# Patient Record
Sex: Male | Born: 1950 | Race: White | Hispanic: No | Marital: Married | Smoking: Never smoker
Health system: Southern US, Community
[De-identification: ages and names within clinical notes are randomized; demographics above are authoritative.]

## PROBLEM LIST (undated history)

## (undated) DIAGNOSIS — I071 Rheumatic tricuspid insufficiency: Secondary | ICD-10-CM

## (undated) DIAGNOSIS — I499 Cardiac arrhythmia, unspecified: Secondary | ICD-10-CM

## (undated) DIAGNOSIS — R011 Cardiac murmur, unspecified: Secondary | ICD-10-CM

## (undated) DIAGNOSIS — D649 Anemia, unspecified: Secondary | ICD-10-CM

## (undated) DIAGNOSIS — I340 Nonrheumatic mitral (valve) insufficiency: Secondary | ICD-10-CM

## (undated) DIAGNOSIS — I341 Nonrheumatic mitral (valve) prolapse: Secondary | ICD-10-CM

## (undated) DIAGNOSIS — R112 Nausea with vomiting, unspecified: Secondary | ICD-10-CM

## (undated) DIAGNOSIS — Z9889 Other specified postprocedural states: Secondary | ICD-10-CM

## (undated) DIAGNOSIS — M169 Osteoarthritis of hip, unspecified: Secondary | ICD-10-CM

## (undated) DIAGNOSIS — I10 Essential (primary) hypertension: Secondary | ICD-10-CM

## (undated) DIAGNOSIS — D469 Myelodysplastic syndrome, unspecified: Secondary | ICD-10-CM

## (undated) DIAGNOSIS — R42 Dizziness and giddiness: Secondary | ICD-10-CM

## (undated) DIAGNOSIS — C61 Malignant neoplasm of prostate: Secondary | ICD-10-CM

## (undated) DIAGNOSIS — I4821 Permanent atrial fibrillation: Secondary | ICD-10-CM

## (undated) DIAGNOSIS — T7840XA Allergy, unspecified, initial encounter: Secondary | ICD-10-CM

## (undated) DIAGNOSIS — I482 Chronic atrial fibrillation, unspecified: Secondary | ICD-10-CM

## (undated) HISTORY — PX: PROSTATE BIOPSY: SHX241

## (undated) HISTORY — DX: Nonrheumatic mitral (valve) prolapse: I34.1

## (undated) HISTORY — PX: CARDIAC CATHETERIZATION: SHX172

## (undated) HISTORY — DX: Dizziness and giddiness: R42

## (undated) HISTORY — PX: VASECTOMY: SHX75

## (undated) HISTORY — PX: KNEE SURGERY: SHX244

## (undated) HISTORY — DX: Allergy, unspecified, initial encounter: T78.40XA

## (undated) HISTORY — DX: Chronic atrial fibrillation, unspecified: I48.20

## (undated) HISTORY — PX: OTHER SURGICAL HISTORY: SHX169

## (undated) HISTORY — DX: Osteoarthritis of hip, unspecified: M16.9

## (undated) HISTORY — DX: Nonrheumatic mitral (valve) insufficiency: I34.0

## (undated) HISTORY — DX: Permanent atrial fibrillation: I48.21

## (undated) HISTORY — DX: Rheumatic tricuspid insufficiency: I07.1

## (undated) HISTORY — PX: COLONOSCOPY: SHX174

## (undated) HISTORY — DX: Other specified postprocedural states: Z98.890

---

## 2000-07-27 ENCOUNTER — Encounter: Payer: Self-pay | Admitting: Cardiology

## 2001-04-24 DIAGNOSIS — Z9889 Other specified postprocedural states: Secondary | ICD-10-CM

## 2001-04-24 HISTORY — DX: Other specified postprocedural states: Z98.890

## 2002-04-29 ENCOUNTER — Encounter: Payer: Self-pay | Admitting: Cardiology

## 2004-10-23 ENCOUNTER — Encounter: Payer: Self-pay | Admitting: Cardiology

## 2006-10-20 ENCOUNTER — Encounter: Admission: RE | Admit: 2006-10-20 | Discharge: 2006-11-30 | Payer: Self-pay | Admitting: Orthopedic Surgery

## 2009-10-08 ENCOUNTER — Encounter: Payer: Self-pay | Admitting: Cardiology

## 2009-10-08 ENCOUNTER — Encounter: Admission: RE | Admit: 2009-10-08 | Discharge: 2009-10-08 | Payer: Self-pay | Admitting: Cardiology

## 2009-10-08 ENCOUNTER — Ambulatory Visit: Payer: Self-pay | Admitting: Cardiology

## 2009-10-09 ENCOUNTER — Ambulatory Visit: Payer: Self-pay | Admitting: Cardiology

## 2009-10-09 ENCOUNTER — Ambulatory Visit (HOSPITAL_COMMUNITY): Admission: RE | Admit: 2009-10-09 | Discharge: 2009-10-09 | Payer: Self-pay | Admitting: Cardiology

## 2009-10-09 ENCOUNTER — Encounter: Payer: Self-pay | Admitting: Cardiology

## 2009-11-12 ENCOUNTER — Ambulatory Visit: Payer: Self-pay | Admitting: Cardiovascular Disease

## 2009-12-10 ENCOUNTER — Ambulatory Visit: Payer: Self-pay | Admitting: Cardiology

## 2010-01-08 ENCOUNTER — Ambulatory Visit: Payer: Self-pay | Admitting: Cardiology

## 2010-02-13 ENCOUNTER — Ambulatory Visit: Payer: Self-pay | Admitting: Cardiovascular Disease

## 2010-03-15 ENCOUNTER — Ambulatory Visit: Payer: Self-pay | Admitting: Cardiology

## 2010-04-22 ENCOUNTER — Ambulatory Visit (INDEPENDENT_AMBULATORY_CARE_PROVIDER_SITE_OTHER): Payer: PRIVATE HEALTH INSURANCE | Admitting: Cardiology

## 2010-04-22 DIAGNOSIS — I4891 Unspecified atrial fibrillation: Secondary | ICD-10-CM

## 2010-04-22 DIAGNOSIS — Z7901 Long term (current) use of anticoagulants: Secondary | ICD-10-CM

## 2010-04-23 ENCOUNTER — Ambulatory Visit: Payer: Self-pay | Admitting: Cardiology

## 2010-04-24 ENCOUNTER — Ambulatory Visit: Payer: Self-pay | Admitting: Cardiology

## 2010-05-21 ENCOUNTER — Ambulatory Visit (INDEPENDENT_AMBULATORY_CARE_PROVIDER_SITE_OTHER): Payer: PRIVATE HEALTH INSURANCE | Admitting: *Deleted

## 2010-05-21 DIAGNOSIS — I4891 Unspecified atrial fibrillation: Secondary | ICD-10-CM | POA: Insufficient documentation

## 2010-05-21 DIAGNOSIS — Z7901 Long term (current) use of anticoagulants: Secondary | ICD-10-CM

## 2010-06-24 ENCOUNTER — Ambulatory Visit (INDEPENDENT_AMBULATORY_CARE_PROVIDER_SITE_OTHER): Payer: PRIVATE HEALTH INSURANCE | Admitting: *Deleted

## 2010-06-24 DIAGNOSIS — Z7901 Long term (current) use of anticoagulants: Secondary | ICD-10-CM

## 2010-06-24 DIAGNOSIS — I4891 Unspecified atrial fibrillation: Secondary | ICD-10-CM

## 2010-07-05 ENCOUNTER — Telehealth: Payer: Self-pay | Admitting: Cardiology

## 2010-07-05 ENCOUNTER — Ambulatory Visit (INDEPENDENT_AMBULATORY_CARE_PROVIDER_SITE_OTHER): Payer: PRIVATE HEALTH INSURANCE | Admitting: *Deleted

## 2010-07-05 DIAGNOSIS — Z7901 Long term (current) use of anticoagulants: Secondary | ICD-10-CM

## 2010-07-05 DIAGNOSIS — I4891 Unspecified atrial fibrillation: Secondary | ICD-10-CM

## 2010-07-05 NOTE — Telephone Encounter (Signed)
Called wanting to schedule his procedure he needed before leaving the country. I would say give him a call but he says he's probably going to come in. Please call back as soon as possible if you can. I couldn't find the file.

## 2010-07-05 NOTE — Telephone Encounter (Signed)
Needs protime before going out of the country.  Coming for one today.

## 2010-07-23 ENCOUNTER — Encounter: Payer: PRIVATE HEALTH INSURANCE | Admitting: *Deleted

## 2010-07-23 ENCOUNTER — Ambulatory Visit: Payer: PRIVATE HEALTH INSURANCE | Admitting: Cardiology

## 2010-08-05 ENCOUNTER — Encounter: Payer: Self-pay | Admitting: Cardiology

## 2010-08-07 ENCOUNTER — Ambulatory Visit (INDEPENDENT_AMBULATORY_CARE_PROVIDER_SITE_OTHER): Payer: 59 | Admitting: Cardiology

## 2010-08-07 ENCOUNTER — Encounter: Payer: Self-pay | Admitting: Cardiology

## 2010-08-07 ENCOUNTER — Encounter (INDEPENDENT_AMBULATORY_CARE_PROVIDER_SITE_OTHER): Payer: 59 | Admitting: *Deleted

## 2010-08-07 DIAGNOSIS — I340 Nonrheumatic mitral (valve) insufficiency: Secondary | ICD-10-CM | POA: Insufficient documentation

## 2010-08-07 DIAGNOSIS — Z7901 Long term (current) use of anticoagulants: Secondary | ICD-10-CM

## 2010-08-07 DIAGNOSIS — I4891 Unspecified atrial fibrillation: Secondary | ICD-10-CM

## 2010-08-07 DIAGNOSIS — M1990 Unspecified osteoarthritis, unspecified site: Secondary | ICD-10-CM

## 2010-08-07 NOTE — Assessment & Plan Note (Signed)
The patient has a history of mild mitral valve prolapse with moderate mitral regurgitation by echocardiogram on 10/09/09.  He does not have any symptoms of congestive heart failure.

## 2010-08-07 NOTE — Progress Notes (Signed)
Neomia Glass Date of Birth:  31-May-1950 [facility] [address]   [address] [place], [place]  [address] [phone]           Fax   [phone]  HPI: This pleasant 60 year old gentleman is seen for a four-month followup office visit.  He has a history of chronic established atrial fibrillation.  He moved to [place] at age 68.  In 1986 he was living in [place] and had a routine physical examination and was found to be in atrial fibrillation without any symptoms.  His physician at that time used quinidine to convert him to sinus rhythm.  In 1989 he had an episode of tachycardia resulting in an increase in his quinidine dose.  He reported that he had a two-dimensional echocardiogram on 04/08/84 in [place] which showed holosystolic admitting of the mitral valve compatible with mitral valve prolapse and otherwise normal M-mode and 2-D echo after moving to [facility] we stopped his quinidine which had not been effective in holding him in sinus rhythm and allow him to remain in atrial fibrillation with controlled ventricular response.  His atrial fibrillationHas been asymptomatic.He has not had any TIA symptoms.  He goes to the gym and exercises 3 times a week.  Current Outpatient Prescriptions  Medication Sig Dispense Refill  . B Complex-C (B-COMPLEX WITH VITAMIN C) tablet Take 1 tablet by mouth daily.        . digoxin (LANOXIN) 0.25 MG tablet Take 250 mcg by mouth daily. 1/2 tablet daily        . Docusate Sodium (COLACE PO) Take by mouth as needed.        . fexofenadine (ALLEGRA) 180 MG tablet Take 180 mg by mouth daily.        . Ibuprofen (ADVIL PO) Take by mouth as needed.        . metoprolol (LOPRESSOR) 50 MG tablet Take 50 mg by mouth daily. 1/2 tablet daily       . Warfarin Sodium (COUMADIN PO) Take by mouth. Take as directed per coumadin clinic          No Known Allergies  Patient Active Problem List  Diagnoses  . Atrial fibrillation  . Mitral  regurgitation  . Osteoarthritis    History  Smoking status  . Never Smoker   Smokeless tobacco  . Not on file    History  Alcohol Use No    Family History  Problem Relation Age of Onset  . Arrhythmia Father     tachycardia  . Cancer Mother   . Breast cancer Sister   . Heart attack Mother     Review of Systems: The patient denies any heat or cold intolerance.  No weight gain or weight loss.  The patient denies headaches or blurry vision.  There is no cough or sputum production.  The patient denies dizziness.  There is no hematuria or hematochezia.  The patient denies any muscle aches or arthritis.  The patient denies any rash.  The patient denies frequent falling or instability.  There is no history of depression or anxiety.  All other systems were reviewed and are negative.   Physical Exam: Filed Vitals:   08/07/10 1602  BP: 100/70  Pulse: 60  The general appearance reveals a well-developed well-nourished gentleman in no distress.The head and neck exam reveals pupils equal and reactive.  Extraocular movements are full.  There is no scleral icterus.  The mouth and pharynx are normal.  The neck is  supple.  The carotids reveal no bruits.  The jugular venous pressure is normal.  The  thyroid is not enlarged.  There is no lymphadenopathy.  The chest is clear to percussion and auscultation.  There are no rales or rhonchi.  Expansion of the chest is symmetrical.  The precordium is quiet.  The first heart sound is normal.  The second heart sound is physiologically split.  There is no murmur gallop rub or click.  There is no abnormal lift or heave.  The abdomen is soft and nontender.  The bowel sounds are normal.  The liver and spleen are not enlarged.  There are no abdominal masses.  There are no abdominal bruits.  Extremities reveal good pedal pulses.  There is no phlebitis or edema.  There is no cyanosis or clubbing.  Strength is normal and symmetrical in all extremities.  There is no  lateralizing weakness.  There are no sensory deficits.  The skin is warm and dry.  There is no rash.    Assessment / Plan: Continue present medication.  Return in 4 months for comprehensive medical examination including PSA.

## 2010-08-07 NOTE — Assessment & Plan Note (Signed)
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  He uses an occasional Advil for hip pain.

## 2010-08-07 NOTE — Assessment & Plan Note (Signed)
The patient has a history of chronic established atrial fibrillation with a controlled ventricular response.  He has been on long-term Coumadin.  He has a history of epistaxis and so we try to keep his INR at the low end of the therapeutic range.  He has not been expressing any chest pain or shortness of breath.

## 2010-09-25 ENCOUNTER — Ambulatory Visit (INDEPENDENT_AMBULATORY_CARE_PROVIDER_SITE_OTHER): Payer: 59 | Admitting: *Deleted

## 2010-09-25 ENCOUNTER — Telehealth: Payer: Self-pay | Admitting: *Deleted

## 2010-09-25 DIAGNOSIS — Z7901 Long term (current) use of anticoagulants: Secondary | ICD-10-CM

## 2010-09-25 DIAGNOSIS — I4891 Unspecified atrial fibrillation: Secondary | ICD-10-CM

## 2010-09-25 LAB — POCT INR: INR: 2

## 2010-09-25 NOTE — Telephone Encounter (Signed)
LMOM for pt to call back and schedule an INR

## 2010-10-23 ENCOUNTER — Ambulatory Visit (INDEPENDENT_AMBULATORY_CARE_PROVIDER_SITE_OTHER): Payer: 59 | Admitting: *Deleted

## 2010-10-23 DIAGNOSIS — Z7901 Long term (current) use of anticoagulants: Secondary | ICD-10-CM

## 2010-10-23 DIAGNOSIS — I4891 Unspecified atrial fibrillation: Secondary | ICD-10-CM

## 2010-11-20 ENCOUNTER — Ambulatory Visit (INDEPENDENT_AMBULATORY_CARE_PROVIDER_SITE_OTHER): Payer: 59 | Admitting: *Deleted

## 2010-11-20 DIAGNOSIS — Z7901 Long term (current) use of anticoagulants: Secondary | ICD-10-CM

## 2010-11-20 DIAGNOSIS — I4891 Unspecified atrial fibrillation: Secondary | ICD-10-CM

## 2010-12-05 ENCOUNTER — Telehealth: Payer: Self-pay | Admitting: Cardiology

## 2010-12-05 NOTE — Telephone Encounter (Signed)
Discussed with  Dr. Patty Sermons and he will call patient

## 2010-12-05 NOTE — Telephone Encounter (Signed)
Pt is upset because he is being charged $65.00 for a coumadin visit and he wants to know if he can go some where else because he is not gonna pay that every month.

## 2010-12-09 ENCOUNTER — Telehealth: Payer: Self-pay | Admitting: *Deleted

## 2010-12-09 NOTE — Telephone Encounter (Signed)
Patient has been therapeutic for quite some time so patient will start having labs every two months.  This will work better with his busy schedule

## 2010-12-09 NOTE — Telephone Encounter (Signed)
Dr. Patty Sermons spoke with patient on 10/11 regarding protimes and frequency of testing.  Will have

## 2010-12-09 NOTE — Telephone Encounter (Signed)
Agree with plan as outlined.  We will put him on an every 8 week schedule for his prothrombin times

## 2010-12-18 ENCOUNTER — Encounter: Payer: 59 | Admitting: *Deleted

## 2011-01-20 ENCOUNTER — Ambulatory Visit (INDEPENDENT_AMBULATORY_CARE_PROVIDER_SITE_OTHER): Payer: 59 | Admitting: *Deleted

## 2011-01-20 DIAGNOSIS — I4891 Unspecified atrial fibrillation: Secondary | ICD-10-CM

## 2011-01-20 DIAGNOSIS — Z7901 Long term (current) use of anticoagulants: Secondary | ICD-10-CM

## 2011-01-20 LAB — POCT INR: INR: 1.6

## 2011-02-01 ENCOUNTER — Other Ambulatory Visit: Payer: Self-pay | Admitting: Cardiology

## 2011-02-03 ENCOUNTER — Telehealth: Payer: Self-pay | Admitting: Cardiology

## 2011-02-03 DIAGNOSIS — I4891 Unspecified atrial fibrillation: Secondary | ICD-10-CM

## 2011-02-03 DIAGNOSIS — E785 Hyperlipidemia, unspecified: Secondary | ICD-10-CM

## 2011-02-03 DIAGNOSIS — Z125 Encounter for screening for malignant neoplasm of prostate: Secondary | ICD-10-CM

## 2011-02-03 DIAGNOSIS — Z79899 Other long term (current) drug therapy: Secondary | ICD-10-CM

## 2011-02-03 NOTE — Telephone Encounter (Signed)
Discussed with  Dr. Patty Sermons and will see for visit and labs.  Left message

## 2011-02-03 NOTE — Telephone Encounter (Signed)
Pt calling re when last appt was, requesting crestor samples for wife baruch lewers, pt needs digoxin refilled pharmacy to fax today, pt out needs asap, uses [facility] mail [facility], pls call

## 2011-02-03 NOTE — Telephone Encounter (Signed)
Called in 14 days [facility] to [facility].  Last CPE 8/11 and was asking about labs.  Do you want to see or just get labs?

## 2011-02-03 NOTE — Telephone Encounter (Signed)
Refilled lanoxin 

## 2011-02-19 ENCOUNTER — Encounter: Payer: 59 | Admitting: *Deleted

## 2011-03-03 ENCOUNTER — Ambulatory Visit: Payer: 59 | Admitting: Cardiology

## 2011-03-03 ENCOUNTER — Ambulatory Visit (INDEPENDENT_AMBULATORY_CARE_PROVIDER_SITE_OTHER): Payer: 59 | Admitting: *Deleted

## 2011-03-03 ENCOUNTER — Other Ambulatory Visit (INDEPENDENT_AMBULATORY_CARE_PROVIDER_SITE_OTHER): Payer: 59 | Admitting: *Deleted

## 2011-03-03 DIAGNOSIS — E785 Hyperlipidemia, unspecified: Secondary | ICD-10-CM

## 2011-03-03 DIAGNOSIS — I4891 Unspecified atrial fibrillation: Secondary | ICD-10-CM

## 2011-03-03 DIAGNOSIS — Z125 Encounter for screening for malignant neoplasm of prostate: Secondary | ICD-10-CM

## 2011-03-03 DIAGNOSIS — Z7901 Long term (current) use of anticoagulants: Secondary | ICD-10-CM

## 2011-03-03 DIAGNOSIS — Z79899 Other long term (current) drug therapy: Secondary | ICD-10-CM

## 2011-03-03 LAB — BASIC METABOLIC PANEL
BUN: 22 mg/dL (ref 6–23)
Chloride: 108 mEq/L (ref 96–112)
Potassium: 4.6 mEq/L (ref 3.5–5.1)

## 2011-03-03 LAB — LIPID PANEL: VLDL: 8.2 mg/dL (ref 0.0–40.0)

## 2011-03-03 LAB — CBC WITH DIFFERENTIAL/PLATELET
Basophils Relative: 0.7 % (ref 0.0–3.0)
Lymphocytes Relative: 33.5 % (ref 12.0–46.0)
Lymphs Abs: 1.4 10*3/uL (ref 0.7–4.0)
MCHC: 34.1 g/dL (ref 30.0–36.0)
MCV: 104.6 fl — ABNORMAL HIGH (ref 78.0–100.0)
Monocytes Absolute: 0.4 10*3/uL (ref 0.1–1.0)
Neutrophils Relative %: 54.3 % (ref 43.0–77.0)
RBC: 3.55 Mil/uL — ABNORMAL LOW (ref 4.22–5.81)
RDW: 13.9 % (ref 11.5–14.6)

## 2011-03-03 LAB — PSA: PSA: 3.18 ng/mL (ref 0.10–4.00)

## 2011-03-03 LAB — POCT INR: INR: 1.8

## 2011-03-04 LAB — HEPATIC FUNCTION PANEL
ALT: 17 U/L (ref 0–53)
AST: 22 U/L (ref 0–37)
Albumin: 3.9 g/dL (ref 3.5–5.2)
Alkaline Phosphatase: 58 U/L (ref 39–117)
Total Protein: 6.2 g/dL (ref 6.0–8.3)

## 2011-03-10 ENCOUNTER — Ambulatory Visit (INDEPENDENT_AMBULATORY_CARE_PROVIDER_SITE_OTHER): Payer: 59 | Admitting: Cardiology

## 2011-03-10 ENCOUNTER — Encounter: Payer: Self-pay | Admitting: Cardiology

## 2011-03-10 VITALS — BP 100/68 | HR 66 | Ht 75.0 in | Wt 183.0 lb

## 2011-03-10 DIAGNOSIS — M1990 Unspecified osteoarthritis, unspecified site: Secondary | ICD-10-CM

## 2011-03-10 DIAGNOSIS — I4891 Unspecified atrial fibrillation: Secondary | ICD-10-CM

## 2011-03-10 NOTE — Assessment & Plan Note (Signed)
The patient takes occasional Advil for arthritic pain.  He has not been aware of any hematochezia or melena.

## 2011-03-10 NOTE — Assessment & Plan Note (Signed)
He is not having any symptoms from his atrial fibrillation.  His last echocardiogram was 10/09/09 and showed a normal ejection fraction of 55-60% with mild biatrial enlargement and with mitral valve prolapse of the posterior leaflet with moderate mitral regurgitation.  Pulmonary artery pressure was normal.  Chest x-ray on 10/08/09 showed normal heart size and clear lungs.  He had a nuclear stress test on 04/29/02 because of atypical chest pain and he had no ischemia and his ejection fraction was 55%

## 2011-03-10 NOTE — Progress Notes (Signed)
Raymond Ray Date of Birth:  May 27, 1950 [facility] [address] [place], [place]  [address] [phone]  Fax   [phone]  HPI: This pleasant 61 year old gentleman is seen for a scheduled followup office visit.  He has a history of chronic atrial fibrillation.  He first developed atrial fibrillation in 1986.  He was asymptomatic and it was found at the time of a routine physical examination.  At that time his physician used clonidine to convert him to sinus rhythm.  In 1989 he had an episode of tachycardia resulting in an increase in his quinidine dose.  He moved to [place] at age 42.  He gives a report that in 1986 an echocardiogram done in [place] showed holosystolic mitral valve prolapse.  He has been in chronic atrial fibrillation.  He remains asymptomatic.  He has not had any TIA symptoms.  He is on chronic Coumadin.  He exercises at the gym 3 times a week.  His review of systems reveals that he has nocturia 0-3 times a night.  He is not having any gastrointestinal symptoms and he has had no hematochezia or melena.  Current Outpatient Prescriptions  Medication Sig Dispense Refill  . B Complex-C (B-COMPLEX WITH VITAMIN C) tablet Take 1 tablet by mouth daily.        . digoxin (LANOXIN) 0.25 MG tablet TAKE ONE-HALF (1/2) TABLET DAILY  45 tablet  3  . Docusate Sodium (COLACE PO) Take by mouth as needed.        . fexofenadine (ALLEGRA) 180 MG tablet Take 180 mg by mouth daily.        . Ibuprofen (ADVIL PO) Take by mouth as needed.        . metoprolol (LOPRESSOR) 50 MG tablet Take 50 mg by mouth daily. 1/2 tablet daily       . Warfarin Sodium (COUMADIN PO) Take by mouth. Take as directed per coumadin clinic          No Known Allergies  Patient Active Problem List  Diagnoses  . Atrial fibrillation  . Mitral regurgitation  . Osteoarthritis  . Encounter for long-term (current) use of anticoagulants    History  Smoking status  . Never Smoker   Smokeless  tobacco  . Not on file    History  Alcohol Use No    Family History  Problem Relation Age of Onset  . Arrhythmia Father     tachycardia  . Cancer Mother   . Breast cancer Sister   . Heart attack Mother     Review of Systems: The patient denies any heat or cold intolerance.  No weight gain or weight loss.  The patient denies headaches or blurry vision.  There is no cough or sputum production.  The patient denies dizziness.  There is no hematuria or hematochezia.  The patient denies any muscle aches or arthritis.  The patient denies any rash.  The patient denies frequent falling or instability.  There is no history of depression or anxiety.  All other systems were reviewed and are negative.   Physical Exam: Filed Vitals:   03/10/11 1514  BP: 100/68  Pulse: 66   the general appearance reveals a well-developed well-nourished gentleman in no distress.Pupils equal and reactive.   Extraocular Movements are full.  There is no scleral icterus.  The mouth and pharynx are normal.  The neck is supple.  The carotids reveal no bruits.  The jugular venous pressure is normal.  The thyroid is not enlarged.  There is no lymphadenopathy.  The chest is clear to percussion and auscultation. There are no rales or rhonchi. Expansion of the chest is symmetrical.  The heart reveals a faint apical systolic murmur.  Pulse is irregularThe abdomen is soft and nontender. Bowel sounds are normal. The liver and spleen are not enlarged. There Are no abdominal masses. There are no bruits.  Rectal exam reveals normal prostate without heart nodules.  Stool is brown and heme-negative.The pedal pulses are good.  There is no phlebitis or edema.  There is no cyanosis or clubbing. Strength is normal and symmetrical in all extremities.  There is no lateralizing weakness.  There are no sensory deficits.  The skin is warm and dry.  There is no rash.     Assessment / Plan: We reviewed his labs.  His hemoglobin is down  slightly.  He does admit to not eating enough protein.  We will plan to recheck another CBC in 6 months when he returns for followup office visit and EKG.  He would be getting a shingles vaccine.  Continue present medication.

## 2011-03-10 NOTE — Patient Instructions (Signed)
Your physician recommends that you continue on your current medications as directed. Please refer to the Current Medication list given to you today.  Your physician wants you to follow-up in: 6 months. You will receive a reminder letter in the mail two months in advance. If you don't receive a letter, please call our office to schedule the follow-up appointment.  

## 2011-03-17 ENCOUNTER — Telehealth: Payer: Self-pay | Admitting: Cardiology

## 2011-03-17 NOTE — Telephone Encounter (Signed)
Fu call °Patient returning your call °

## 2011-03-17 NOTE — Telephone Encounter (Signed)
Patient actually calling regarding wife's Crestor, advised finally approved by insurance

## 2011-04-01 ENCOUNTER — Other Ambulatory Visit: Payer: Self-pay | Admitting: Cardiology

## 2011-04-28 ENCOUNTER — Ambulatory Visit (INDEPENDENT_AMBULATORY_CARE_PROVIDER_SITE_OTHER): Payer: 59 | Admitting: *Deleted

## 2011-04-28 ENCOUNTER — Telehealth: Payer: Self-pay | Admitting: Cardiology

## 2011-04-28 DIAGNOSIS — I4891 Unspecified atrial fibrillation: Secondary | ICD-10-CM

## 2011-04-28 DIAGNOSIS — Z7901 Long term (current) use of anticoagulants: Secondary | ICD-10-CM

## 2011-04-28 LAB — POCT INR: INR: 1.9

## 2011-04-28 NOTE — Telephone Encounter (Signed)
Walk In pt Form " Pt has Questions about Crestor" sent to [facility] 04/28/11/KM

## 2011-05-14 ENCOUNTER — Ambulatory Visit (INDEPENDENT_AMBULATORY_CARE_PROVIDER_SITE_OTHER): Payer: 59 | Admitting: Sports Medicine

## 2011-05-14 VITALS — BP 118/60 | Ht 76.0 in | Wt 190.0 lb

## 2011-05-14 DIAGNOSIS — M216X9 Other acquired deformities of unspecified foot: Secondary | ICD-10-CM

## 2011-05-14 DIAGNOSIS — M7740 Metatarsalgia, unspecified foot: Secondary | ICD-10-CM

## 2011-05-14 DIAGNOSIS — M7750 Other enthesopathy of unspecified foot: Secondary | ICD-10-CM

## 2011-05-14 NOTE — Assessment & Plan Note (Signed)
As above with metatarsalgia -- continue sports insoles and metatarsal pads. If needs further support, return for custom orthotic from [facility].

## 2011-05-14 NOTE — Progress Notes (Signed)
  Subjective:    Patient ID: Raymond Ray, male    DOB: 28-Sep-1950, 61 y.o.   MRN: [id number]  HPI 1.  Left pain ball of foot:   Noted first in January when wearing flip flops at an indoor resort.  Noted immediate pain ball of foot.  Has been about the same since then.  Can have pain along medial or lateral portion of ball of foot.  Is seeing a chiropractor who recommended orthotics for back pain, and these have greatly helped.  Feels that he notes improvement in pain with even minimal cushioning, even just a sock.  Also notes that socks ball up under his foot along MTP joints chronically (~10 years).  This does with all socks and all shoes.    Usually walks for distance and runs for exercise but doesn't feel he can do this due to pain.  Doing elliptical for exercise 3 x a week currently.    Review of Systems No fevers or chills, no night sweats.     Objective:   Physical Exam Gen:  Alert, cooperative patient who appears stated age in no acute distress.  Vital signs reviewed. 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   Pes planus BL. Beginnings of bunions and hallux valgus noted bilaterally as well.  Hammer toes also beginning BL 5th phalanges. On Left, callus noted below Left metatarsal and 1st metatarsal.   Morton callus noted Right foot. Good plantar and dorsal flexion BL.  Strength 5/5 Full AROM and PROM BL. Leg length good.  Observed walking in hallway:  noted valgus gait on Right knee. Prior open meniscus surgical scar noted on Right knee      Assessment & Plan:

## 2011-05-14 NOTE — Patient Instructions (Addendum)
Keep wearing your current soles with your work shoes. If the insole doesn't fit your dress shoes, at least just wear the metatarsal pads.  When walking or exercising, use the sports insoles along with the metatarsal pads.  We have provided you with a catalog, order more soles and metatarsal pads when you need them.   If you need further support, we can do custom orthotics at that time.    Follow up with [place] as needed.

## 2011-05-14 NOTE — Assessment & Plan Note (Signed)
Treated with sports insoles and metatarsal pads here in clinic. Observed gait and noted improvement. Recommended to use sports insoles when walking and running; continue with other insoles (which have built-in metatarsal pads)

## 2011-05-30 ENCOUNTER — Telehealth: Payer: Self-pay | Admitting: Cardiology

## 2011-05-30 MED ORDER — DIGOXIN 250 MCG PO TABS: 250.0000 ug | ORAL_TABLET | Freq: Every day | ORAL | Status: DC

## 2011-05-30 NOTE — Telephone Encounter (Signed)
Pt needs digoxin refill [facility] 30 days, pt out didn't realize and needs today

## 2011-06-16 ENCOUNTER — Ambulatory Visit (INDEPENDENT_AMBULATORY_CARE_PROVIDER_SITE_OTHER): Payer: 59 | Admitting: *Deleted

## 2011-06-16 DIAGNOSIS — Z7901 Long term (current) use of anticoagulants: Secondary | ICD-10-CM

## 2011-06-16 DIAGNOSIS — I4891 Unspecified atrial fibrillation: Secondary | ICD-10-CM

## 2011-07-14 ENCOUNTER — Telehealth: Payer: Self-pay | Admitting: Cardiology

## 2011-07-14 NOTE — Telephone Encounter (Signed)
New msg Pt called about record of last tetanus shot for him and his wife-[id number]

## 2011-07-14 NOTE — Telephone Encounter (Signed)
Advised according to our medical records last tetanus in 1994

## 2011-08-18 ENCOUNTER — Ambulatory Visit (INDEPENDENT_AMBULATORY_CARE_PROVIDER_SITE_OTHER): Payer: Self-pay

## 2011-08-18 DIAGNOSIS — I4891 Unspecified atrial fibrillation: Secondary | ICD-10-CM

## 2011-08-18 DIAGNOSIS — Z7901 Long term (current) use of anticoagulants: Secondary | ICD-10-CM

## 2011-08-18 LAB — POCT INR: INR: 1.7

## 2011-10-20 ENCOUNTER — Ambulatory Visit (INDEPENDENT_AMBULATORY_CARE_PROVIDER_SITE_OTHER): Payer: No Typology Code available for payment source | Admitting: *Deleted

## 2011-10-20 DIAGNOSIS — I4891 Unspecified atrial fibrillation: Secondary | ICD-10-CM

## 2011-10-20 DIAGNOSIS — Z7901 Long term (current) use of anticoagulants: Secondary | ICD-10-CM

## 2011-12-12 ENCOUNTER — Telehealth: Payer: Self-pay | Admitting: Cardiology

## 2011-12-12 NOTE — Telephone Encounter (Signed)
error 

## 2011-12-15 ENCOUNTER — Ambulatory Visit (INDEPENDENT_AMBULATORY_CARE_PROVIDER_SITE_OTHER): Payer: No Typology Code available for payment source | Admitting: *Deleted

## 2011-12-15 DIAGNOSIS — I4891 Unspecified atrial fibrillation: Secondary | ICD-10-CM

## 2011-12-15 DIAGNOSIS — Z7901 Long term (current) use of anticoagulants: Secondary | ICD-10-CM

## 2011-12-15 LAB — POCT INR: INR: 2.2

## 2012-02-09 ENCOUNTER — Other Ambulatory Visit: Payer: Self-pay | Admitting: Orthopedic Surgery

## 2012-02-09 ENCOUNTER — Ambulatory Visit
Admission: RE | Admit: 2012-02-09 | Discharge: 2012-02-09 | Disposition: A | Discharge status: Home / Self Care | Payer: No Typology Code available for payment source | Source: Ambulatory Visit | Attending: Orthopedic Surgery | Admitting: Orthopedic Surgery

## 2012-02-09 ENCOUNTER — Ambulatory Visit (INDEPENDENT_AMBULATORY_CARE_PROVIDER_SITE_OTHER): Payer: No Typology Code available for payment source | Admitting: *Deleted

## 2012-02-09 DIAGNOSIS — I4891 Unspecified atrial fibrillation: Secondary | ICD-10-CM

## 2012-02-09 DIAGNOSIS — M5416 Radiculopathy, lumbar region: Secondary | ICD-10-CM

## 2012-02-09 DIAGNOSIS — Z7901 Long term (current) use of anticoagulants: Secondary | ICD-10-CM

## 2012-02-09 LAB — POCT INR: INR: 2.3

## 2012-02-10 ENCOUNTER — Telehealth: Payer: Self-pay | Admitting: *Deleted

## 2012-02-10 NOTE — Telephone Encounter (Signed)
Having steroid injection in his back on December 23 if ok to hold coumadin. Will discuss holding Coumadin with  Dr. Patty Sermons in morning

## 2012-02-11 NOTE — Telephone Encounter (Signed)
I called Raymond Ray and told him it would be okay to hold Coumadin for 5 days prior to the epidural steroid injection.  No bridging with Lovenox needed.

## 2012-03-31 ENCOUNTER — Other Ambulatory Visit: Payer: Self-pay | Admitting: *Deleted

## 2012-03-31 MED ORDER — DIGOXIN 250 MCG PO TABS: 125.0000 ug | ORAL_TABLET | Freq: Every day | ORAL | Status: DC

## 2012-04-05 ENCOUNTER — Ambulatory Visit (INDEPENDENT_AMBULATORY_CARE_PROVIDER_SITE_OTHER): Payer: No Typology Code available for payment source | Admitting: *Deleted

## 2012-04-05 ENCOUNTER — Telehealth: Payer: Self-pay | Admitting: Cardiology

## 2012-04-05 DIAGNOSIS — I4891 Unspecified atrial fibrillation: Secondary | ICD-10-CM

## 2012-04-05 DIAGNOSIS — Z7901 Long term (current) use of anticoagulants: Secondary | ICD-10-CM

## 2012-04-05 NOTE — Telephone Encounter (Signed)
Walk In Pt Form " Pt Needs Samples" If Available Sent to [facility]  04/05/12/KM

## 2012-05-26 ENCOUNTER — Other Ambulatory Visit: Payer: Self-pay | Admitting: Pharmacist

## 2012-05-26 MED ORDER — WARFARIN SODIUM 5 MG PO TABS: ORAL_TABLET | ORAL | Status: DC

## 2012-05-31 ENCOUNTER — Ambulatory Visit (INDEPENDENT_AMBULATORY_CARE_PROVIDER_SITE_OTHER): Payer: No Typology Code available for payment source | Admitting: *Deleted

## 2012-05-31 DIAGNOSIS — I4891 Unspecified atrial fibrillation: Secondary | ICD-10-CM

## 2012-05-31 DIAGNOSIS — Z7901 Long term (current) use of anticoagulants: Secondary | ICD-10-CM

## 2012-05-31 LAB — POCT INR: INR: 1.7

## 2012-06-28 ENCOUNTER — Other Ambulatory Visit: Payer: Self-pay | Admitting: *Deleted

## 2012-06-28 MED ORDER — DIGOXIN 250 MCG PO TABS: 125.0000 ug | ORAL_TABLET | Freq: Every day | ORAL | Status: DC

## 2012-06-28 MED ORDER — METOPROLOL TARTRATE 50 MG PO TABS: 25.0000 mg | ORAL_TABLET | Freq: Every day | ORAL | Status: DC

## 2012-09-10 ENCOUNTER — Encounter: Payer: Self-pay | Admitting: Physician Assistant

## 2012-09-22 ENCOUNTER — Telehealth: Payer: Self-pay | Admitting: *Deleted

## 2012-09-22 ENCOUNTER — Ambulatory Visit (INDEPENDENT_AMBULATORY_CARE_PROVIDER_SITE_OTHER): Payer: No Typology Code available for payment source | Admitting: Physician Assistant

## 2012-09-22 ENCOUNTER — Encounter: Payer: Self-pay | Admitting: Physician Assistant

## 2012-09-22 VITALS — BP 90/52 | HR 48 | Ht 75.0 in | Wt 182.5 lb

## 2012-09-22 DIAGNOSIS — Z7901 Long term (current) use of anticoagulants: Secondary | ICD-10-CM

## 2012-09-22 DIAGNOSIS — Z1211 Encounter for screening for malignant neoplasm of colon: Secondary | ICD-10-CM

## 2012-09-22 MED ORDER — MOVIPREP 100 G PO SOLR: 1.0000 | Freq: Once | ORAL | Status: DC

## 2012-09-22 NOTE — Telephone Encounter (Signed)
Left message to call back  

## 2012-09-22 NOTE — Telephone Encounter (Signed)
09/22/2012    RE: Raymond Ray DOB: Oct 19, 1950 MRN: [id number]   Dear Cassell Clement,    We have scheduled the above patient for an endoscopic procedure. Our records show that he is on anticoagulation therapy.   Please advise as to how long the patient may come off his therapy of Xarelto prior to the procedure, which is scheduled for 11-15-2012.  Please fax back/ or route the completed form to [facility] at [phone].   Sincerely,    Amy Esterwood PA-C

## 2012-09-22 NOTE — Progress Notes (Signed)
Reviewed and agree with management. Robert D. Kaplan, M.D., FACG  

## 2012-09-22 NOTE — Telephone Encounter (Signed)
Re: Raymond Ray colonoscopy scheduled for 11/15/12, he should take his last dose of Xarelto on Friday 11/12/12

## 2012-09-22 NOTE — Telephone Encounter (Signed)
Advised patient

## 2012-09-22 NOTE — Progress Notes (Signed)
Subjective:    Patient ID: Raymond Ray, male    DOB: 12-26-1950, 62 y.o.   MRN: [id number]  HPI  Raymond Ray is a pleasant 62 year old white male, and new to GI today who comes in to discuss colonoscopy for screening. He states that he had a colonoscopy in 2003 in [place] and was told that this was negative but he cannot remember where he had the procedure done. We have searched our records and cannot find colonoscopy done here. He does have history of chronic atrial fibrillation, had been maintained on Coumadin for years and is now on Xarelto  and followed by Dr. Patty Sermons  from a cardiac standpoint. He is otherwise in fairly good health. He has no current GI complaints, specifically no problems with abdominal pain, changes in his bowel habits, melena or hematochezia. Says she generally does take a stool softener. His family history is negative for colon cancer/ polyps as far as he is aware.  Blood pressure today here low at 90/52 and his pulse is irregular and  In the 40's. He is asymptomatic. He has not seen Dr. Patty Sermons  in the past year    Review of Systems  Constitutional: Negative.   HENT: Negative.   Eyes: Negative.   Respiratory: Negative.   Cardiovascular: Negative.   Gastrointestinal: Negative.   Endocrine: Negative.   Genitourinary: Negative.   Musculoskeletal: Negative.   Skin: Negative.   Allergic/Immunologic: Negative.   Neurological: Negative.   Hematological: Negative.   Psychiatric/Behavioral: Negative.    Outpatient Prescriptions Prior to Visit  Medication Sig Dispense Refill  . B Complex-C (B-COMPLEX WITH VITAMIN C) tablet Take 1 tablet by mouth daily.        . digoxin (LANOXIN) 0.25 MG tablet Take 0.5 tablets (125 mcg total) by mouth daily.  45 tablet  0  . Docusate Sodium (COLACE PO) Take by mouth as needed.        . fexofenadine (ALLEGRA) 180 MG tablet Take 180 mg by mouth daily as needed.       . metoprolol (LOPRESSOR) 50 MG tablet Take 0.5 tablets (25 mg  total) by mouth daily.  90 tablet  0  . Ibuprofen (ADVIL PO) Take by mouth as needed.        . warfarin (COUMADIN) 5 MG tablet Take as directed by Anticoagulation clinic  116 tablet  1   No facility-administered medications prior to visit.   No Known Allergies Patient Active Problem List   Diagnosis Date Noted  . Metatarsalgia 05/14/2011  . 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 Encounter for long-term (current) use of anticoagulants 09/25/2010  . Mitral regurgitation 08/07/2010  . Osteoarthritis 08/07/2010  . Atrial fibrillation 05/21/2010   History  Substance Use Topics  . Smoking status: Never Smoker   . Smokeless tobacco: Never Used  . Alcohol Use: No   family history includes Arrhythmia in his father; Breast cancer in his sister; Cancer in his mother; and Heart attack in his mother.     Objective:   Physical Exam well-developed white male in no acute distress, pleasant blood pressure 90/52 pulse 48 height 6 foot 3 weight 182. HEENT; nontraumatic normocephalic EOMI PERRLA sclera anicteric, Neck ;supple no JVD, Cardiovascular; irregular rate and rhythm, slow, no murmur rub or gallop.,, Pulmonary ;clear bilaterally, Abdomen; soft nontender nondistended bowel sounds are active there is no palpable mass or hepatosplenomegaly, Rectal ;exam not done, Extremities ;no clubbing cyanosis or edema skin warm and dry, Psych; mood and affect normal and appropriate  Assessment & Plan:  #79  62 year old white male on chronic anticoagulation with  Xarelto for atrial fibrillation who is due for followup screening colonoscopy, currently asymptomatic. #2 bradycardia and mild hypotension-patient asymptomatic  Plan; patient is scheduled for colonoscopy with Raymond Ray is discussed in detail with the patient and he is agreeable to proceed will schedule 3-4 weeks out to allow time for cardiac evaluation We will obtain consent from Dr. Patty Sermons for patient to stop Xarelto for  least 24 hours prior to his procedure. Also requested patient be seen by cardiology prior to his colonoscopy given mild bradycardia and hypotension today

## 2012-09-22 NOTE — Patient Instructions (Signed)
We sent the prescription for the colonoscopy prep to [facility] [address]   We will contact you once we hear from Dr. Patty Sermons regarding the Xarelto.   Make an appointment to see Dr. Patty Sermons before the colonoscopy scheduled for 11-08-2012.  We are sending the note from todays visit to Dr. Patty Sermons.   You have been scheduled for a colonoscopy with propofol. Please follow written instructions given to you at your visit today.  Please pick up your prep kit at the pharmacy within the next 1-3 days. If you use inhalers (even only as needed), please bring them with you on the day of your procedure. Your physician has requested that you go to www.startemmi.com and enter the access code given to you at your visit today. This web site gives a general overview about your procedure. However, you should still follow specific instructions given to you by our office regarding your preparation for the procedure.

## 2012-09-23 ENCOUNTER — Encounter: Payer: Self-pay | Admitting: *Deleted

## 2012-09-23 ENCOUNTER — Other Ambulatory Visit: Payer: Self-pay | Admitting: *Deleted

## 2012-09-27 ENCOUNTER — Other Ambulatory Visit: Payer: Self-pay

## 2012-09-27 MED ORDER — DIGOXIN 250 MCG PO TABS: 125.0000 ug | ORAL_TABLET | Freq: Every day | ORAL | Status: DC

## 2012-10-08 ENCOUNTER — Telehealth: Payer: Self-pay | Admitting: *Deleted

## 2012-10-08 NOTE — Telephone Encounter (Signed)
I called and left a message for the pt that we heard back from Dr. Patty Sermons. The patient  is to take his last dose of Xarelto on 11-12-2012 and resume it on 11-16-2012 the day after the procedure ( scheduled for 11-15-2012 ).

## 2012-10-27 ENCOUNTER — Encounter: Payer: Self-pay | Admitting: Cardiology

## 2012-10-27 ENCOUNTER — Ambulatory Visit (INDEPENDENT_AMBULATORY_CARE_PROVIDER_SITE_OTHER): Payer: No Typology Code available for payment source | Admitting: Cardiology

## 2012-10-27 VITALS — BP 108/62 | HR 54 | Ht 75.0 in | Wt 177.8 lb

## 2012-10-27 DIAGNOSIS — I4891 Unspecified atrial fibrillation: Secondary | ICD-10-CM

## 2012-10-27 DIAGNOSIS — I340 Nonrheumatic mitral (valve) insufficiency: Secondary | ICD-10-CM

## 2012-10-27 DIAGNOSIS — I059 Rheumatic mitral valve disease, unspecified: Secondary | ICD-10-CM

## 2012-10-27 NOTE — Assessment & Plan Note (Signed)
The patient has a history of atrial fibrillation with slow ventricular response.  His blood pressure tends to run low but the patient is asymptomatic.  He denies any dizziness or syncope.

## 2012-10-27 NOTE — Patient Instructions (Signed)
DECREASE YOUR DIGOXIN TO 1/2 TABLET MondaY, Wednesday, AND Friday ONLY  Your physician recommends that you schedule a follow-up appointment in: 3 MONTH OV/EKG

## 2012-10-27 NOTE — Progress Notes (Signed)
Raymond Ray Date of Birth:  December 25, 1950 [facility] [address] [place], [place]  [address] [phone]  Fax   [phone]  HPI: This pleasant 62 year old gentleman is seen for a scheduled followup office visit. He has a history of chronic atrial fibrillation. He first developed atrial fibrillation in 1986. He was asymptomatic and it was found at the time of a routine physical examination. At that time his physician used quinidine to convert him to sinus rhythm. In 1989 he had an episode of tachycardia resulting in an increase in his quinidine dose. He moved to [place] at age 54. He gives a report that in 1986 an echocardiogram done in [place] showed holosystolic mitral valve prolapse. He has been in chronic atrial fibrillation. He remains asymptomatic. He has not had any TIA symptoms. He was on chronic Coumadin anticoagulation for many years and is now on Xarelto 20 mg daily. He exercises at the gym 3 times a week.    Current Outpatient Prescriptions  Medication Sig Dispense Refill  . B Complex-C (B-COMPLEX WITH VITAMIN C) tablet Take 1 tablet by mouth daily.        . digoxin (LANOXIN) 0.25 MG tablet Take 0.25 mg by mouth as directed. 1/2 TABLET Monday, Wednesday, AND Friday ONLY      . Docusate Sodium (COLACE PO) Take by mouth as needed.        . fexofenadine (ALLEGRA) 180 MG tablet Take 180 mg by mouth daily as needed.       Marland Kitchen HYDROcodone-acetaminophen (NORCO/VICODIN) 5-325 MG per tablet Take 1 tablet by mouth every 4 (four) hours as needed.       . metoprolol (LOPRESSOR) 50 MG tablet Take 0.5 tablets (25 mg total) by mouth daily.  90 tablet  0  . MOVIPREP 100 G SOLR Take 1 kit (200 g total) by mouth once. "Pharmacist please use BIN: [id number] GROUP: [id number] ID: [id number] Call [phone] for pharmacy questions "Pt will save $10"  1 kit  0  . XARELTO 20 MG TABS 20 mg daily.       Marland Kitchen zolpidem (AMBIEN) 10 MG tablet Take 10 mg by mouth at bedtime as needed.         No current facility-administered medications for this visit.    No Known Allergies  Patient Active Problem List   Diagnosis Date Noted  . Metatarsalgia 05/14/2011  . 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 Encounter for long-term (current) use of anticoagulants 09/25/2010  . Mitral regurgitation 08/07/2010  . Osteoarthritis 08/07/2010  . Atrial fibrillation 05/21/2010    History  Smoking status  . Never Smoker   Smokeless tobacco  . Never Used    History  Alcohol Use No    Family History  Problem Relation Age of Onset  . Arrhythmia Father     tachycardia  . Cancer Mother   . Breast cancer Sister   . Heart attack Mother     Review of Systems: The patient denies any heat or cold intolerance.  No weight gain or weight loss.  The patient denies headaches or blurry vision.  There is no cough or sputum production.  The patient denies dizziness.  There is no hematuria or hematochezia.  The patient denies any muscle aches or arthritis.  The patient denies any rash.  The patient denies frequent falling or instability.  There is no history of depression or anxiety.  All other systems were reviewed and are negative.   Physical Exam: Filed Vitals:  10/27/12 1508  BP: 108/62  Pulse: 54   the general appearance reveals a well-developed well-nourished gentleman in no distress.The head and neck exam reveals pupils equal and reactive.  Extraocular movements are full.  There is no scleral icterus.  The mouth and pharynx are normal.  The neck is supple.  The carotids reveal no bruits.  The jugular venous pressure is normal.  The  thyroid is not enlarged.  There is no lymphadenopathy.  The chest is clear to percussion and auscultation.  There are no rales or rhonchi.  Expansion of the chest is symmetrical.  The precordium is quiet.  Pulse is slow and irregular.  The first heart sound is normal.  The second heart sound is physiologically split.  There is no murmur gallop rub or  click.  There is no abnormal lift or heave.  The abdomen is soft and nontender.  The bowel sounds are normal.  The liver and spleen are not enlarged.  There are no abdominal masses.  There are no abdominal bruits.  Extremities reveal good pedal pulses.  There is no phlebitis or edema.  There is no cyanosis or clubbing.  Strength is normal and symmetrical in all extremities.  There is no lateralizing weakness.  There are no sensory deficits.  The skin is warm and dry.  There is no rash.   EKG shows atrial fibrillation with slow ventricular response of 54 beats per minute.  No ischemic changes   Assessment / Plan: In view of his slow ventricular response to atrial fibrillation, we will decrease his digoxin to just one half tablet 3 times a week on Monday Wednesday and Friday only.  Depending on his response we will probably phase out digoxin altogether at his next office visit. The patient is scheduled for a routine screening colonoscopy later this month and has been given instructions concerning timing of stopping Xarelto.  Because his pulse and blood pressure tends to be low, we will have him hold all of his medications on the morning of his colonoscopy. Continue regular exercise.  Recheck in 3 months for office visit and EKG and consider further reduction or elimination of digoxin at that time.

## 2012-10-27 NOTE — Assessment & Plan Note (Signed)
The patient is not having any symptoms of congestive heart failure.  His exercise tolerance is good.  He works out at [employer] 3 times a week.

## 2012-11-08 ENCOUNTER — Encounter: Payer: No Typology Code available for payment source | Admitting: Gastroenterology

## 2012-11-15 ENCOUNTER — Encounter: Payer: Self-pay | Admitting: Gastroenterology

## 2012-11-15 ENCOUNTER — Ambulatory Visit (AMBULATORY_SURGERY_CENTER): Payer: No Typology Code available for payment source | Admitting: Gastroenterology

## 2012-11-15 VITALS — BP 109/75 | HR 69 | Temp 96.7°F | Resp 14 | Ht 75.0 in | Wt 182.0 lb

## 2012-11-15 DIAGNOSIS — K648 Other hemorrhoids: Secondary | ICD-10-CM

## 2012-11-15 DIAGNOSIS — Z1211 Encounter for screening for malignant neoplasm of colon: Secondary | ICD-10-CM

## 2012-11-15 MED ORDER — SODIUM CHLORIDE 0.9 % IV SOLN: 500.0000 mL | INTRAVENOUS | Status: DC

## 2012-11-15 NOTE — Progress Notes (Addendum)
Patient did not have preoperative order for IV antibiotic SSI prophylaxis. (G8918)  Patient did not experience any of the following events: a burn prior to discharge; a fall within the facility; wrong site/side/patient/procedure/implant event; or a hospital transfer or hospital admission upon discharge from the facility. (G8907)  

## 2012-11-15 NOTE — Op Note (Addendum)
 [facility] [address] [place], [address]   COLONOSCOPY PROCEDURE REPORT  PATIENT: Raymond Ray, Raymond Ray  MR#: [id number] BIRTHDATE: 05/10/1950 , 62  yrs. old GENDER: Male ENDOSCOPIST: Louis Meckel, MD REFERRED AO:[id number] Lysbeth Galas, M.D. PROCEDURE DATE:  11/15/2012 PROCEDURE:   Colonoscopy, diagnostic First Screening Colonoscopy - Avg.  risk and is 50 yrs.  old or older - No.  Prior Negative Screening - Now for repeat screening. 10 or more years since last screening  History of Adenoma - Now for follow-up colonoscopy & has been > or = to 3 yrs.  N/A  Polyps Removed Today? No.  Recommend repeat exam, <10 yrs? No. ASA CLASS:   Class II INDICATIONS:Average risk patient for colon cancer. MEDICATIONS: MAC sedation, administered by CRNA and propofol (Diprivan) 200mg  IV  DESCRIPTION OF PROCEDURE:   After the risks benefits and alternatives of the procedure were thoroughly explained, informed consent was obtained.  A digital rectal exam revealed no abnormalities of the rectum.   The LB [id number]  endoscope was introduced through the anus and advanced to the cecum, which was identified by both the appendix and ileocecal valve. No adverse events experienced.   The quality of the prep was excellent using Suprep  The instrument was then slowly withdrawn as the colon was fully examined.      COLON FINDINGS: Internal hemorrhoids were found.   The colon mucosa was otherwise normal.  Retroflexed views revealed no abnormalities. The time to cecum=3 minutes 09 seconds.  Withdrawal time=8 minutes 05 seconds.  The scope was withdrawn and the procedure completed. COMPLICATIONS: There were no complications.  ENDOSCOPIC IMPRESSION: 1.   Internal hemorrhoids 2.   The colon mucosa was otherwise normal  RECOMMENDATIONS: [1.  resume Xarelto today 2.   Followup colonoscopy for colorectal cancer screening in 10 years  eSigned:  Louis Meckel, MD 11/15/2012 2:52  PM Revised: 11/15/2012 2:52 PM  cc:

## 2012-11-15 NOTE — Progress Notes (Signed)
Pt stopped his Xarelto on 11-12-12

## 2012-11-15 NOTE — Progress Notes (Signed)
Procedure ends, to recovery, report given aand VSS.

## 2012-11-15 NOTE — Patient Instructions (Addendum)
Resume Coumadin today.YOU HAD AN ENDOSCOPIC PROCEDURE TODAY AT THE [facility]: Refer to the procedure report that was given to you for any specific questions about what was found during the examination.  If the procedure report does not answer your questions, please call your gastroenterologist to clarify.  If you requested that your care partner not be given the details of your procedure findings, then the procedure report has been included in a sealed envelope for you to review at your convenience later.  YOU SHOULD EXPECT: Some feelings of bloating in the abdomen. Passage of more gas than usual.  Walking can help get rid of the air that was put into your GI tract during the procedure and reduce the bloating. If you had a lower endoscopy (such as a colonoscopy or flexible sigmoidoscopy) you may notice spotting of blood in your stool or on the toilet paper. If you underwent a bowel prep for your procedure, then you may not have a normal bowel movement for a few days.  DIET: Your first meal following the procedure should be a light meal and then it is ok to progress to your normal diet.  A half-sandwich or bowl of soup is an example of a good first meal.  Heavy or fried foods are harder to digest and may make you feel nauseous or bloated.  Likewise meals heavy in dairy and vegetables can cause extra gas to form and this can also increase the bloating.  Drink plenty of fluids but you should avoid alcoholic beverages for 24 hours.  ACTIVITY: Your care partner should take you home directly after the procedure.  You should plan to take it easy, moving slowly for the rest of the day.  You can resume normal activity the day after the procedure however you should NOT DRIVE or use heavy machinery for 24 hours (because of the sedation medicines used during the test).    SYMPTOMS TO REPORT IMMEDIATELY: A gastroenterologist can be reached at any hour.  During normal business hours, 8:30 AM to 5:00 PM  Monday through Friday, call [phone].  After hours and on weekends, please call the GI answering service at [phone] who will take a message and have the physician on call contact you.   Following lower endoscopy (colonoscopy or flexible sigmoidoscopy):  Excessive amounts of blood in the stool  Significant tenderness or worsening of abdominal pains  Swelling of the abdomen that is new, acute  Fever of 100F or higher  Fever of 100F or higher  Black, tarry-looking stools  FOLLOW UP: If any biopsies were taken you will be contacted by phone or by letter within the next 1-3 weeks.  Call your gastroenterologist if you have not heard about the biopsies in 3 weeks.  Our staff will call the home number listed on your records the next business day following your procedure to check on you and address any questions or concerns that you may have at that time regarding the information given to you following your procedure. This is a courtesy call and so if there is no answer at the home number and we have not heard from you through the emergency physician on call, we will assume that you have returned to your regular daily activities without incident.  SIGNATURES/CONFIDENTIALITY: You and/or your care partner have signed paperwork which will be entered into your electronic medical record.  These signatures attest to the fact that that the information above on your After Visit Summary has been  reviewed and is understood.  Full responsibility of the confidentiality of this discharge information lies with you and/or your care-partner. 

## 2012-11-16 ENCOUNTER — Telehealth: Payer: Self-pay

## 2012-11-16 NOTE — Telephone Encounter (Signed)
  Follow up Call-  Call back number 11/15/2012  Post procedure Call Back phone  # [phone]  Permission to leave phone message Yes     Patient questions:  Do you have a fever, pain , or abdominal swelling? no Pain Score  0 *  Have you tolerated food without any problems? yes  Have you been able to return to your normal activities? yes  Do you have any questions about your discharge instructions: Diet   no Medications  no Follow up visit  no  Do you have questions or concerns about your Care? no  Actions: * If pain score is 4 or above: No action needed, pain <4.

## 2012-12-16 ENCOUNTER — Other Ambulatory Visit: Payer: Self-pay | Admitting: Cardiology

## 2012-12-30 ENCOUNTER — Other Ambulatory Visit: Payer: Self-pay

## 2013-01-02 ENCOUNTER — Telehealth: Payer: Self-pay | Admitting: Cardiology

## 2013-01-02 NOTE — Telephone Encounter (Signed)
  The patient has had 3 nosebleeds in past several weeks. He recently switched from warfarin to xarelto. I advised him to stop xarelto and he will restart warfarin at his previous dose. He will come in in 1 week for INR

## 2013-01-11 ENCOUNTER — Ambulatory Visit (INDEPENDENT_AMBULATORY_CARE_PROVIDER_SITE_OTHER): Payer: No Typology Code available for payment source | Admitting: *Deleted

## 2013-01-11 DIAGNOSIS — Z7901 Long term (current) use of anticoagulants: Secondary | ICD-10-CM

## 2013-01-11 DIAGNOSIS — I4891 Unspecified atrial fibrillation: Secondary | ICD-10-CM

## 2013-01-11 LAB — POCT INR: INR: 2.1

## 2013-02-02 ENCOUNTER — Encounter: Payer: Self-pay | Admitting: Cardiology

## 2013-02-02 ENCOUNTER — Ambulatory Visit (INDEPENDENT_AMBULATORY_CARE_PROVIDER_SITE_OTHER): Payer: No Typology Code available for payment source | Admitting: Pharmacist

## 2013-02-02 ENCOUNTER — Ambulatory Visit (INDEPENDENT_AMBULATORY_CARE_PROVIDER_SITE_OTHER): Payer: No Typology Code available for payment source | Admitting: Cardiology

## 2013-02-02 VITALS — BP 108/69 | HR 77 | Ht 76.0 in | Wt 181.0 lb

## 2013-02-02 DIAGNOSIS — Z7901 Long term (current) use of anticoagulants: Secondary | ICD-10-CM

## 2013-02-02 DIAGNOSIS — I340 Nonrheumatic mitral (valve) insufficiency: Secondary | ICD-10-CM

## 2013-02-02 DIAGNOSIS — I4891 Unspecified atrial fibrillation: Secondary | ICD-10-CM

## 2013-02-02 DIAGNOSIS — I059 Rheumatic mitral valve disease, unspecified: Secondary | ICD-10-CM

## 2013-02-02 DIAGNOSIS — M1990 Unspecified osteoarthritis, unspecified site: Secondary | ICD-10-CM

## 2013-02-02 NOTE — Assessment & Plan Note (Signed)
The patient experiences low back pain and general achiness after working in the yard.  He takes an occasional ibuprofen but not on a regular basis.

## 2013-02-02 NOTE — Progress Notes (Signed)
Raymond Ray Date of Birth:  12/29/1950 [address] [place], [place]  [address] [phone]  Fax   [phone]  HPI: This pleasant 62 year old gentleman is seen for a scheduled followup office visit. He has a history of chronic atrial fibrillation. He first developed atrial fibrillation in 1986. He was asymptomatic and it was found at the time of a routine physical examination. At that time his physician used quinidine to convert him to sinus rhythm. In 1989 he had an episode of tachycardia resulting in an increase in his quinidine dose. He moved to [place] at age 62. He gives a report that in 1986 an echocardiogram done in [place] showed holosystolic mitral valve prolapse. He has been in chronic atrial fibrillation. He remains asymptomatic. He has not had any TIA symptoms. He was on chronic Coumadin anticoagulation for many years and then earlier this year switched to Xarelto.  However he had 3 nosebleeds in one week on Xarelto and so he requested to be switched back to Coumadin.  His INR goal is 1.8 up to 2.2.  We are intentionally keeping it low because of a past history of nosebleeds on Coumadin also.  He has not been experiencing any chest discomfort or shortness of breath. He exercises at the gym 3 times a week.    Current Outpatient Prescriptions  Medication Sig Dispense Refill  . Ascorbic Acid (VITAMIN C PO) Take by mouth daily.      . B Complex-C (B-COMPLEX WITH VITAMIN C) tablet Take 1 tablet by mouth daily.        . digoxin (LANOXIN) 0.25 MG tablet Take 0.25 mg by mouth as directed. 1/2 TABLET Monday, Wednesday, AND Friday ONLY      . Docusate Sodium (COLACE PO) Take by mouth as needed.        . fexofenadine (ALLEGRA) 180 MG tablet Take 180 mg by mouth daily as needed.       Marland Kitchen HYDROcodone-acetaminophen (NORCO/VICODIN) 5-325 MG per tablet Take 1 tablet by mouth every 4 (four) hours as needed.       . metoprolol (LOPRESSOR) 50 MG tablet TAKE ONE-HALF (1/2) TABLET  DAILY (CALL OFFICE TO SCHEDULE FOLLOW UP APPOINTMENT)  90 tablet  0  . warfarin (COUMADIN) 5 MG tablet daily. Take  As directed      . zolpidem (AMBIEN) 10 MG tablet Take 10 mg by mouth at bedtime as needed.        No current facility-administered medications for this visit.    No Known Allergies  Patient Active Problem List   Diagnosis Date Noted  . Metatarsalgia 05/14/2011  . 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 Encounter for long-term (current) use of anticoagulants 09/25/2010  . Mitral regurgitation 08/07/2010  . Osteoarthritis 08/07/2010  . Atrial fibrillation 05/21/2010    History  Smoking status  . Never Smoker   Smokeless tobacco  . Never Used    History  Alcohol Use No    Family History  Problem Relation Age of Onset  . Arrhythmia Father     tachycardia  . Cancer Mother   . Heart attack Mother   . Breast cancer Sister   . Colon cancer Neg Hx   . Esophageal cancer Neg Hx   . Stomach cancer Neg Hx   . Rectal cancer Neg Hx     Review of Systems: The patient denies any heat or cold intolerance.  No weight gain or weight loss.  The patient denies headaches or  blurry vision.  There is no cough or sputum production.  The patient denies dizziness.  There is no hematuria or hematochezia.  The patient denies any muscle aches or arthritis.  The patient denies any rash.  The patient denies frequent falling or instability.  There is no history of depression or anxiety.  All other systems were reviewed and are negative.   Physical Exam: Filed Vitals:   02/02/13 1450  BP: 108/69  Pulse: 77   the general appearance reveals a well-developed well-nourished gentleman in no distress.The head and neck exam reveals pupils equal and reactive.  Extraocular movements are full.  There is no scleral icterus.  The mouth and pharynx are normal.  The neck is supple.  The carotids reveal no bruits.  The jugular venous pressure is normal.  The  thyroid is not enlarged.  There is  no lymphadenopathy.  The chest is clear to percussion and auscultation.  There are no rales or rhonchi.  Expansion of the chest is symmetrical.  The precordium is quiet.  Pulse is slow and irregular.  The first heart sound is normal.  The second heart sound is physiologically split.  There is no  gallop rub or click.  There is a faint apical systolic murmur.  There is no abnormal lift or heave.  The abdomen is soft and nontender.  The bowel sounds are normal.  The liver and spleen are not enlarged.  There are no abdominal masses.  There are no abdominal bruits.  Extremities reveal good pedal pulses.  There is no phlebitis or edema.  There is no cyanosis or clubbing.  Strength is normal and symmetrical in all extremities.  There is no lateralizing weakness.  There are no sensory deficits.  The skin is warm and dry.  There is no rash.   EKG today shows atrial fibrillation with ventricular rate of 72 and no ischemic changes.   Assessment / Plan: The patient feels well on current medications.  No cardiac symptoms.  No further nosebleeds.  We will continue same medication and be rechecked in for office visit in 6 months.  He is getting his lipids checked by his PCP.

## 2013-02-02 NOTE — Assessment & Plan Note (Signed)
The patient is not having any symptoms of CHF.  He has a murmur of mild mitral regurgitation

## 2013-02-02 NOTE — Assessment & Plan Note (Signed)
At his last office visit we cut back on his digoxin dose because of bradycardia.  Since then he has felt well and his heart rate at rest is now in the 70s.

## 2013-02-02 NOTE — Patient Instructions (Signed)
Your physician recommends that you continue on your current medications as directed. Please refer to the Current Medication list given to you today.  Your physician wants you to follow-up in:   You will receive a reminder letter in the mail two months in advance. If you don't receive a letter, please call our office to schedule the follow-up appointment.  

## 2013-03-18 ENCOUNTER — Other Ambulatory Visit: Payer: Self-pay | Admitting: Cardiology

## 2013-03-30 ENCOUNTER — Ambulatory Visit (INDEPENDENT_AMBULATORY_CARE_PROVIDER_SITE_OTHER): Payer: No Typology Code available for payment source | Admitting: Pharmacist

## 2013-03-30 DIAGNOSIS — Z7901 Long term (current) use of anticoagulants: Secondary | ICD-10-CM

## 2013-03-30 DIAGNOSIS — I4891 Unspecified atrial fibrillation: Secondary | ICD-10-CM

## 2013-03-30 LAB — POCT INR: INR: 1.6

## 2013-05-11 ENCOUNTER — Ambulatory Visit (INDEPENDENT_AMBULATORY_CARE_PROVIDER_SITE_OTHER): Payer: No Typology Code available for payment source | Admitting: *Deleted

## 2013-05-11 DIAGNOSIS — Z7901 Long term (current) use of anticoagulants: Secondary | ICD-10-CM

## 2013-05-11 DIAGNOSIS — I4891 Unspecified atrial fibrillation: Secondary | ICD-10-CM

## 2013-05-11 LAB — POCT INR: INR: 2.2

## 2013-06-25 ENCOUNTER — Other Ambulatory Visit: Payer: Self-pay | Admitting: Cardiology

## 2013-07-06 ENCOUNTER — Ambulatory Visit (INDEPENDENT_AMBULATORY_CARE_PROVIDER_SITE_OTHER): Payer: No Typology Code available for payment source | Admitting: *Deleted

## 2013-07-06 DIAGNOSIS — I4891 Unspecified atrial fibrillation: Secondary | ICD-10-CM

## 2013-07-06 DIAGNOSIS — Z7901 Long term (current) use of anticoagulants: Secondary | ICD-10-CM

## 2013-07-06 LAB — POCT INR: INR: 1.8

## 2013-08-03 ENCOUNTER — Telehealth: Payer: Self-pay | Admitting: Cardiology

## 2013-08-03 NOTE — Telephone Encounter (Signed)
Will forward to  Dr. Brackbill for review 

## 2013-08-03 NOTE — Telephone Encounter (Signed)
Okay for the patient to stop Coumadin 5 days before injection.  No bridging indicated.

## 2013-08-03 NOTE — Telephone Encounter (Signed)
New message    1. Patient would like to come off blood thinner for 5 days - next Tuesday schedule spinal injection.     Dr. Nelva Bush office  Attention Elmyra Ricks.  Fax # [phone]

## 2013-08-04 NOTE — Telephone Encounter (Signed)
Left message to call back  

## 2013-08-04 NOTE — Telephone Encounter (Signed)
Advised patient. Patient asking if he should he resume Warfarin day of or following day of injection. Will forward to  Dr. Mare Ferrari for review

## 2013-08-04 NOTE — Telephone Encounter (Signed)
Left message when to resume

## 2013-08-04 NOTE — Telephone Encounter (Signed)
He can resume the evening after surgery since it will take several days to take effect.

## 2013-08-09 ENCOUNTER — Ambulatory Visit (INDEPENDENT_AMBULATORY_CARE_PROVIDER_SITE_OTHER): Payer: No Typology Code available for payment source | Admitting: *Deleted

## 2013-08-09 DIAGNOSIS — Z7901 Long term (current) use of anticoagulants: Secondary | ICD-10-CM

## 2013-08-09 DIAGNOSIS — I4891 Unspecified atrial fibrillation: Secondary | ICD-10-CM

## 2013-08-09 LAB — POCT INR: INR: 1.1

## 2013-08-31 ENCOUNTER — Ambulatory Visit (INDEPENDENT_AMBULATORY_CARE_PROVIDER_SITE_OTHER): Payer: No Typology Code available for payment source | Admitting: Pharmacist

## 2013-08-31 DIAGNOSIS — Z7901 Long term (current) use of anticoagulants: Secondary | ICD-10-CM

## 2013-08-31 DIAGNOSIS — I4891 Unspecified atrial fibrillation: Secondary | ICD-10-CM

## 2013-08-31 LAB — POCT INR: INR: 2.1

## 2013-09-18 ENCOUNTER — Other Ambulatory Visit: Payer: Self-pay | Admitting: Cardiology

## 2013-10-14 ENCOUNTER — Other Ambulatory Visit: Payer: Self-pay | Admitting: *Deleted

## 2013-10-14 MED ORDER — WARFARIN SODIUM 5 MG PO TABS: ORAL_TABLET | ORAL | Status: DC

## 2013-10-20 ENCOUNTER — Other Ambulatory Visit: Payer: Self-pay | Admitting: *Deleted

## 2013-10-20 MED ORDER — WARFARIN SODIUM 5 MG PO TABS: ORAL_TABLET | ORAL | Status: DC

## 2013-10-20 NOTE — Telephone Encounter (Signed)
Patient requests 3 day supply of coumadin to last until his mail order shipment arrives.

## 2013-10-26 ENCOUNTER — Ambulatory Visit (INDEPENDENT_AMBULATORY_CARE_PROVIDER_SITE_OTHER): Payer: No Typology Code available for payment source | Admitting: *Deleted

## 2013-10-26 DIAGNOSIS — I4891 Unspecified atrial fibrillation: Secondary | ICD-10-CM

## 2013-10-26 DIAGNOSIS — Z7901 Long term (current) use of anticoagulants: Secondary | ICD-10-CM

## 2013-10-26 LAB — POCT INR: INR: 1.8

## 2013-12-01 ENCOUNTER — Other Ambulatory Visit: Payer: Self-pay | Admitting: Cardiology

## 2013-12-02 NOTE — Telephone Encounter (Signed)
Left message to call back to clarify dose.

## 2013-12-02 NOTE — Telephone Encounter (Signed)
Should this be daily or Mon, Wed, Fri? Please advise. Thanks, MI

## 2013-12-05 NOTE — Telephone Encounter (Signed)
Follow up  ° ° ° °Returning call back to nurse  °

## 2013-12-06 NOTE — Telephone Encounter (Signed)
Left message to call back and verify how he was taking his digoxin

## 2013-12-12 NOTE — Telephone Encounter (Signed)
Patient only taking Digoxin 1/2 tablet Monday, Wednesday, and Friday only. Rx had been sent over so he did not miss any doses. Advised to continue taking as he has been, verbalized understanding.

## 2013-12-17 ENCOUNTER — Other Ambulatory Visit: Payer: Self-pay | Admitting: Cardiology

## 2013-12-28 ENCOUNTER — Ambulatory Visit (INDEPENDENT_AMBULATORY_CARE_PROVIDER_SITE_OTHER): Payer: No Typology Code available for payment source | Admitting: *Deleted

## 2013-12-28 DIAGNOSIS — I4891 Unspecified atrial fibrillation: Secondary | ICD-10-CM

## 2013-12-28 DIAGNOSIS — Z7901 Long term (current) use of anticoagulants: Secondary | ICD-10-CM

## 2013-12-28 LAB — POCT INR: INR: 1.9

## 2014-02-22 ENCOUNTER — Ambulatory Visit (INDEPENDENT_AMBULATORY_CARE_PROVIDER_SITE_OTHER): Payer: No Typology Code available for payment source | Admitting: Surgery

## 2014-02-22 DIAGNOSIS — Z7901 Long term (current) use of anticoagulants: Secondary | ICD-10-CM

## 2014-02-22 DIAGNOSIS — I4891 Unspecified atrial fibrillation: Secondary | ICD-10-CM

## 2014-02-22 LAB — POCT INR: INR: 1.7

## 2014-03-14 ENCOUNTER — Telehealth: Payer: Self-pay | Admitting: Cardiology

## 2014-03-14 NOTE — Telephone Encounter (Signed)
Will forward to  Dr. Brackbill for review 

## 2014-03-14 NOTE — Telephone Encounter (Signed)
The patient is medically cleared for back injection.  He should leave off his warfarin for 5 days prior to surgery

## 2014-03-14 NOTE — Telephone Encounter (Signed)
New Message     Patient needs surgical clearance to have a back injection @ [facility]. PLease call Dianna @ number provided  Thanks

## 2014-03-16 NOTE — Telephone Encounter (Signed)
Faxed this to [facility]

## 2014-03-18 ENCOUNTER — Other Ambulatory Visit: Payer: Self-pay | Admitting: Cardiology

## 2014-03-21 ENCOUNTER — Ambulatory Visit (INDEPENDENT_AMBULATORY_CARE_PROVIDER_SITE_OTHER)
Payer: No Typology Code available for payment source | Admitting: Pharmacist Clinician (PhC)/ Clinical Pharmacy Specialist

## 2014-03-21 ENCOUNTER — Ambulatory Visit: Payer: No Typology Code available for payment source

## 2014-03-21 DIAGNOSIS — I4891 Unspecified atrial fibrillation: Secondary | ICD-10-CM

## 2014-03-21 DIAGNOSIS — Z7901 Long term (current) use of anticoagulants: Secondary | ICD-10-CM

## 2014-03-21 LAB — POCT INR: INR: 1.2

## 2014-04-19 ENCOUNTER — Ambulatory Visit (INDEPENDENT_AMBULATORY_CARE_PROVIDER_SITE_OTHER): Payer: No Typology Code available for payment source | Admitting: *Deleted

## 2014-04-19 DIAGNOSIS — I4891 Unspecified atrial fibrillation: Secondary | ICD-10-CM

## 2014-04-19 DIAGNOSIS — Z7901 Long term (current) use of anticoagulants: Secondary | ICD-10-CM

## 2014-04-19 LAB — POCT INR: INR: 2.6

## 2014-04-24 ENCOUNTER — Other Ambulatory Visit: Payer: Self-pay | Admitting: *Deleted

## 2014-04-24 MED ORDER — METOPROLOL TARTRATE 50 MG PO TABS: ORAL_TABLET | ORAL | Status: DC

## 2014-04-26 ENCOUNTER — Telehealth: Payer: Self-pay | Admitting: Cardiology

## 2014-04-26 NOTE — Telephone Encounter (Signed)
Requested records from Dr Luiz Iron office

## 2014-04-26 NOTE — Telephone Encounter (Signed)
New Msg       Pt calling to see if Dr. Mare Ferrari would need his information from his last complete physical.   Dr that completed physical is Dr. Dione Housekeeper [phone].  Please return pt call.

## 2014-05-01 NOTE — Telephone Encounter (Signed)
Requested EKG again Other things in Epic under care everywhere

## 2014-05-03 ENCOUNTER — Ambulatory Visit (INDEPENDENT_AMBULATORY_CARE_PROVIDER_SITE_OTHER): Payer: No Typology Code available for payment source | Admitting: *Deleted

## 2014-05-03 ENCOUNTER — Ambulatory Visit: Payer: No Typology Code available for payment source | Admitting: Cardiology

## 2014-05-03 DIAGNOSIS — Z7901 Long term (current) use of anticoagulants: Secondary | ICD-10-CM

## 2014-05-03 DIAGNOSIS — I4891 Unspecified atrial fibrillation: Secondary | ICD-10-CM

## 2014-05-03 LAB — POCT INR: INR: 1.9

## 2014-06-04 ENCOUNTER — Other Ambulatory Visit: Payer: Self-pay | Admitting: Cardiology

## 2014-06-05 ENCOUNTER — Other Ambulatory Visit: Payer: Self-pay | Admitting: *Deleted

## 2014-06-26 ENCOUNTER — Other Ambulatory Visit: Payer: Self-pay

## 2014-06-28 ENCOUNTER — Ambulatory Visit (INDEPENDENT_AMBULATORY_CARE_PROVIDER_SITE_OTHER): Payer: No Typology Code available for payment source | Admitting: *Deleted

## 2014-06-28 ENCOUNTER — Encounter: Payer: Self-pay | Admitting: Cardiology

## 2014-06-28 ENCOUNTER — Ambulatory Visit (INDEPENDENT_AMBULATORY_CARE_PROVIDER_SITE_OTHER): Payer: No Typology Code available for payment source | Admitting: Cardiology

## 2014-06-28 ENCOUNTER — Ambulatory Visit
Admission: RE | Admit: 2014-06-28 | Discharge: 2014-06-28 | Disposition: A | Discharge status: Home / Self Care | Payer: No Typology Code available for payment source | Source: Ambulatory Visit | Attending: Cardiology | Admitting: Cardiology

## 2014-06-28 VITALS — BP 106/64 | HR 75 | Ht 75.0 in | Wt 176.8 lb

## 2014-06-28 DIAGNOSIS — I340 Nonrheumatic mitral (valve) insufficiency: Secondary | ICD-10-CM | POA: Diagnosis not present

## 2014-06-28 DIAGNOSIS — Z7901 Long term (current) use of anticoagulants: Secondary | ICD-10-CM

## 2014-06-28 DIAGNOSIS — I482 Chronic atrial fibrillation, unspecified: Secondary | ICD-10-CM

## 2014-06-28 DIAGNOSIS — I4891 Unspecified atrial fibrillation: Secondary | ICD-10-CM | POA: Diagnosis not present

## 2014-06-28 LAB — POCT INR: INR: 2.2

## 2014-06-28 NOTE — Progress Notes (Signed)
Cardiology Office Note   Date:  06/28/2014   ID:  Raymond Ray, DOB [birth year], MRN [id number]  PCP:  Sherrie Mustache, MD  Cardiologist: Darlin Coco MD  No chief complaint on file.     History of Present Illness: Raymond Ray is a 64 y.o. male who presents for follow-up office visit.  This pleasant 64 year old gentleman is seen for a scheduled followup office visit. He has a history of chronic atrial fibrillation. He first developed atrial fibrillation in 1986. He was asymptomatic and it was found at the time of a routine physical examination. At that time his physician used quinidine to convert him to sinus rhythm. In 1989 he had an episode of tachycardia resulting in an increase in his quinidine dose. He moved to [place] at age 21. He gives a report that in 1986 an echocardiogram done in [place] showed holosystolic mitral valve prolapse. He has been in chronic atrial fibrillation. He remains asymptomatic. He has not had any TIA symptoms. He was on chronic Coumadin anticoagulation for many years and then last year switched to Xarelto. However he had 3 nosebleeds in one week on Xarelto and so he requested to be switched back to Coumadin. His INR goal is 1.8 up to 2.2. We are intentionally keeping it low because of a past history of nosebleeds on Coumadin also. He has not been experiencing any chest discomfort or shortness of breath. He exercises at the gym 3 times a week.  Since last visit he has not been experiencing any new cardiac symptoms.  No chest pain or shortness of breath.  Good exercise tolerance.  No dizziness or syncope.  He really is not aware of his heart rate.  heart rate today is 75. The patient has had some arthritis of his right hip.  He has had some beneficial injections into the right hip. Patient has had some symptoms of erectile dysfunction and is requesting Viagra.  He did not recall that he had been previously on a trial of Cialis.  Past Medical  History  Diagnosis Date  . Chronic atrial fibrillation   . Mitral valve prolapse   . Osteoarthritis of hip   . History of colonoscopy 04/2001    negative  . Allergy     Past Surgical History  Procedure Laterality Date  . Knee surgery      left at age 53 in [place]  . Vasectomy    . Colonoscopy    . Spinal injection      December 2013     Current Outpatient Prescriptions  Medication Sig Dispense Refill  . Ascorbic Acid (VITAMIN C PO) Take 1 tablet by mouth daily.     . B Complex-C (B-COMPLEX WITH VITAMIN C) tablet Take 1 tablet by mouth daily.      . digoxin (LANOXIN) 0.25 MG tablet Take 1/2 tablet on Monday, Wednesday, and Friday only    . fexofenadine (ALLEGRA) 180 MG tablet Take 90 mg by mouth daily as needed (for allergies or skin itching).     Marland Kitchen HYDROcodone-acetaminophen (NORCO/VICODIN) 5-325 MG per tablet Take 1 tablet by mouth every 4 (four) hours as needed (for pain).     . metoprolol (LOPRESSOR) 50 MG tablet Take 50 mg by mouth daily.     Marland Kitchen warfarin (COUMADIN) 5 MG tablet Take 5 mg by mouth. Or as directed by coumadin clinic    . zolpidem (AMBIEN) 10 MG tablet Take 1 tablet (10 mg total) by mouth at bedtime as needed  for sleep. 30 tablet 0   No current facility-administered medications for this visit.    Allergies:   Review of patient's allergies indicates no known allergies.    Social History:  The patient  reports that he has never smoked. He has never used smokeless tobacco. He reports that he does not drink alcohol or use illicit drugs.   Family History:  The patient's family history includes Arrhythmia in his father; Breast cancer in his sister; Cancer in his mother; Heart attack in his mother. There is no history of Colon cancer, Esophageal cancer, Stomach cancer, or Rectal cancer.    ROS:  Please see the history of present illness.   Otherwise, review of systems are positive for none.   All other systems are reviewed and negative.    PHYSICAL EXAM: VS:   BP 106/64 mmHg  Pulse 75  Ht 6\' 3"  (1.905 m)  Wt 176 lb 12.8 oz (80.196 kg)  BMI 22.10 kg/m2 , BMI Body mass index is 22.1 kg/(m^2). GEN: Well nourished, well developed, in no acute distress HEENT: normal Neck: no JVD, carotid bruits, or masses Cardiac: Irregularly irregular.  Grade 2/6 apical systolic murmur of mitral regurgitation.  No gallop or rub. Respiratory:  clear to auscultation bilaterally, normal work of breathing GI: soft, nontender, nondistended, + BS MS: no deformity or atrophy Skin: warm and dry, no rash Neuro:  Strength and sensation are intact Psych: euthymic mood, full affect   EKG:  EKG is ordered today. The ekg ordered today demonstrates atrial fibrillation with controlled ventricular response.  Otherwise normal.  No significant change since 02/02/13   Recent Labs: No results found for requested labs within last 365 days.    Lipid Panel    Component Value Date/Time   CHOL 166 03/03/2011 0922   TRIG 41.0 03/03/2011 0922   HDL 55.80 03/03/2011 0922   CHOLHDL 3 03/03/2011 0922   VLDL 8.2 03/03/2011 0922   LDLCALC 102* 03/03/2011 0922      Wt Readings from Last 3 Encounters:  06/28/14 176 lb 12.8 oz (80.196 kg)  02/02/13 181 lb (82.101 kg)  11/15/12 182 lb (82.555 kg)         ASSESSMENT AND PLAN:  1.  Chronic established permanent atrial fibrillation with controlled ventricular response. 2.  Mitral valve prolapse with mitral regurgitation.   Current medicines are reviewed at length with the patient today.  The patient does not have concerns regarding medicines.  The following changes have been made:  no change  Labs/ tests ordered today include:  Orders Placed This Encounter  Procedures  . DG Chest 2 View   We will get a chest x-ray to update his x-ray.  His last one was in 2011.  We'll continue current medication.  Trial of Viagra 100 mg as directed.  He will check on the insurance coverage before he decides to proceed with that. Recheck  in 6 months for follow-up office visit  Signed, Darlin Coco MD 06/28/2014 5:56 PM    [address], [place], [place]  [address] Phone: [phone]; Fax: [phone]

## 2014-06-28 NOTE — Patient Instructions (Addendum)
Medication Instructions:  Your physician recommends that you continue on your current medications as directed. Please refer to the Current Medication list given to you today.  Labwork: NONE  Testing/Procedures: A chest x-ray takes a picture of the organs and structures inside the chest, including the heart, lungs, and blood vessels. This test can show several things, including, whether the heart is enlarges; whether fluid is building up in the lungs; and whether pacemaker / defibrillator leads are still in place. [facility] [place]   Follow-Up: Your physician wants you to follow-up in: [place] will receive a reminder letter in the mail two months in advance. If you don't receive a letter, please call our office to schedule the follow-up appointment.

## 2014-07-03 ENCOUNTER — Other Ambulatory Visit: Payer: Self-pay | Admitting: Cardiology

## 2014-07-05 NOTE — Telephone Encounter (Signed)
Spoke with patient and he has been taking Metoprolol 50 mg 1/2 tablet by mouth daily, never knew of any increase Patient taking th 1/2 tablet daily when he was seen at last ov,  Dr. Mare Ferrari dictated to continue same dose of medications  Refilled as patient had been taking and the way refill request came

## 2014-07-05 NOTE — Telephone Encounter (Signed)
It looks like the patients dose was changed to 50mg  qd by another physician. Ok to refill with this sig? Please advise. Thanks, MI

## 2014-07-05 NOTE — Telephone Encounter (Signed)
Left message to call back  

## 2014-07-11 ENCOUNTER — Telehealth: Payer: Self-pay | Admitting: *Deleted

## 2014-07-11 NOTE — Telephone Encounter (Signed)
A representative from [facility] called to clarify quantity of viagra. Please return call at [phone] and provide reference number [id number]. Thanks, MI

## 2014-07-11 NOTE — Telephone Encounter (Signed)
Spoke with pharmacy and they wanted to verify quantity of number 16 Viagra  Verified with  Dr. Mare Ferrari and advised pharmacy

## 2014-08-23 ENCOUNTER — Ambulatory Visit (INDEPENDENT_AMBULATORY_CARE_PROVIDER_SITE_OTHER): Payer: 59 | Admitting: *Deleted

## 2014-08-23 DIAGNOSIS — Z7901 Long term (current) use of anticoagulants: Secondary | ICD-10-CM | POA: Diagnosis not present

## 2014-08-23 DIAGNOSIS — I482 Chronic atrial fibrillation, unspecified: Secondary | ICD-10-CM

## 2014-08-23 DIAGNOSIS — I4891 Unspecified atrial fibrillation: Secondary | ICD-10-CM

## 2014-08-23 LAB — POCT INR: INR: 2.2

## 2014-09-15 ENCOUNTER — Other Ambulatory Visit: Payer: Self-pay | Admitting: Pharmacist

## 2014-09-15 MED ORDER — WARFARIN SODIUM 5 MG PO TABS: 5.0000 mg | ORAL_TABLET | Freq: Every day | ORAL | Status: DC

## 2014-09-18 ENCOUNTER — Other Ambulatory Visit: Payer: Self-pay

## 2014-09-18 MED ORDER — WARFARIN SODIUM 5 MG PO TABS: 5.0000 mg | ORAL_TABLET | Freq: Every day | ORAL | Status: DC

## 2014-09-18 MED ORDER — METOPROLOL TARTRATE 50 MG PO TABS: ORAL_TABLET | ORAL | Status: DC

## 2014-09-18 MED ORDER — DIGOXIN 250 MCG PO TABS: ORAL_TABLET | ORAL | Status: DC

## 2014-09-19 ENCOUNTER — Telehealth: Payer: Self-pay | Admitting: Cardiology

## 2014-09-19 NOTE — Telephone Encounter (Signed)
Will forward to  Dr. Brackbill for review 

## 2014-09-19 NOTE — Telephone Encounter (Signed)
New message     Request for surgical clearance:  1. What type of surgery is being performed? Injection in back  2. When is this surgery scheduled? Not scheduled at this time, possibly August 3rd  3. Are there any medications that need to be held prior to surgery and how long?Warfarin 5 days   4. Name of physician performing surgery? Dr. Nelva Bush  5. What is your office phone and fax number? [facility] [phone]   Fax [phone]  Please call to discuss

## 2014-09-19 NOTE — Telephone Encounter (Signed)
Okay to leave off Coumadin 5 days prior to surgical procedure.  The patient does not require bridging.

## 2014-09-20 ENCOUNTER — Other Ambulatory Visit: Payer: Self-pay

## 2014-09-20 MED ORDER — METOPROLOL TARTRATE 50 MG PO TABS: ORAL_TABLET | ORAL | Status: DC

## 2014-09-20 MED ORDER — DIGOXIN 250 MCG PO TABS: ORAL_TABLET | ORAL | Status: DC

## 2014-09-20 MED ORDER — WARFARIN SODIUM 5 MG PO TABS: ORAL_TABLET | ORAL | Status: DC

## 2014-09-20 NOTE — Telephone Encounter (Signed)
Coumadin refill sent [facility] as pt requested.

## 2014-09-20 NOTE — Telephone Encounter (Signed)
Advised patient, faxed and confirmation received

## 2014-09-20 NOTE — Telephone Encounter (Signed)
Patient called in requesting refill of Coumadin.  He has recently changed his pharmacy to [facility].

## 2014-09-20 NOTE — Telephone Encounter (Signed)
Left message to call back  

## 2014-09-27 ENCOUNTER — Ambulatory Visit (INDEPENDENT_AMBULATORY_CARE_PROVIDER_SITE_OTHER): Payer: 59 | Admitting: Pharmacist Clinician (PhC)/ Clinical Pharmacy Specialist

## 2014-09-27 DIAGNOSIS — Z7901 Long term (current) use of anticoagulants: Secondary | ICD-10-CM | POA: Diagnosis not present

## 2014-09-27 DIAGNOSIS — I482 Chronic atrial fibrillation, unspecified: Secondary | ICD-10-CM

## 2014-09-27 DIAGNOSIS — I4891 Unspecified atrial fibrillation: Secondary | ICD-10-CM

## 2014-09-27 LAB — POCT INR: INR: 1.1

## 2014-10-18 ENCOUNTER — Ambulatory Visit (INDEPENDENT_AMBULATORY_CARE_PROVIDER_SITE_OTHER): Payer: 59 | Admitting: Pharmacist

## 2014-10-18 DIAGNOSIS — I4891 Unspecified atrial fibrillation: Secondary | ICD-10-CM | POA: Diagnosis not present

## 2014-10-18 DIAGNOSIS — Z7901 Long term (current) use of anticoagulants: Secondary | ICD-10-CM

## 2014-10-18 LAB — POCT INR: INR: 2.3

## 2014-12-20 ENCOUNTER — Ambulatory Visit (INDEPENDENT_AMBULATORY_CARE_PROVIDER_SITE_OTHER): Payer: 59 | Admitting: *Deleted

## 2014-12-20 DIAGNOSIS — I482 Chronic atrial fibrillation, unspecified: Secondary | ICD-10-CM

## 2014-12-20 DIAGNOSIS — Z7901 Long term (current) use of anticoagulants: Secondary | ICD-10-CM | POA: Diagnosis not present

## 2014-12-20 DIAGNOSIS — I4891 Unspecified atrial fibrillation: Secondary | ICD-10-CM

## 2014-12-20 LAB — POCT INR: INR: 1.8

## 2015-02-21 ENCOUNTER — Ambulatory Visit (INDEPENDENT_AMBULATORY_CARE_PROVIDER_SITE_OTHER): Payer: 59 | Admitting: Pharmacist

## 2015-02-21 DIAGNOSIS — I482 Chronic atrial fibrillation, unspecified: Secondary | ICD-10-CM

## 2015-02-21 DIAGNOSIS — I4891 Unspecified atrial fibrillation: Secondary | ICD-10-CM

## 2015-02-21 DIAGNOSIS — Z7901 Long term (current) use of anticoagulants: Secondary | ICD-10-CM

## 2015-02-21 LAB — POCT INR: INR: 2.1

## 2015-04-04 ENCOUNTER — Other Ambulatory Visit: Payer: Self-pay | Admitting: Cardiology

## 2015-04-04 ENCOUNTER — Telehealth: Payer: Self-pay | Admitting: Cardiology

## 2015-04-04 NOTE — Telephone Encounter (Signed)
New Message  Pt requested to speakw / RN concerning Dr Mare Ferrari retiring and his Rxs. Please call back and discuss.

## 2015-04-04 NOTE — Telephone Encounter (Signed)
Left message to call back  

## 2015-04-05 NOTE — Telephone Encounter (Signed)
Spoke with patient and he will be seeing  Dr. Mare Ferrari later this month for office visit before he retires

## 2015-04-18 ENCOUNTER — Ambulatory Visit (INDEPENDENT_AMBULATORY_CARE_PROVIDER_SITE_OTHER): Payer: 59 | Admitting: *Deleted

## 2015-04-18 ENCOUNTER — Encounter: Payer: Self-pay | Admitting: Cardiology

## 2015-04-18 ENCOUNTER — Ambulatory Visit (INDEPENDENT_AMBULATORY_CARE_PROVIDER_SITE_OTHER): Payer: 59 | Admitting: Cardiology

## 2015-04-18 VITALS — BP 108/64 | HR 60 | Ht 75.0 in | Wt 180.8 lb

## 2015-04-18 DIAGNOSIS — I482 Chronic atrial fibrillation: Secondary | ICD-10-CM | POA: Diagnosis not present

## 2015-04-18 DIAGNOSIS — Z7901 Long term (current) use of anticoagulants: Secondary | ICD-10-CM

## 2015-04-18 DIAGNOSIS — N529 Male erectile dysfunction, unspecified: Secondary | ICD-10-CM | POA: Insufficient documentation

## 2015-04-18 DIAGNOSIS — N528 Other male erectile dysfunction: Secondary | ICD-10-CM

## 2015-04-18 DIAGNOSIS — I4891 Unspecified atrial fibrillation: Secondary | ICD-10-CM | POA: Diagnosis not present

## 2015-04-18 DIAGNOSIS — I4821 Permanent atrial fibrillation: Secondary | ICD-10-CM

## 2015-04-18 LAB — POCT INR: INR: 2.2

## 2015-04-18 MED ORDER — SILDENAFIL CITRATE 20 MG PO TABS: ORAL_TABLET | ORAL | Status: DC

## 2015-04-18 NOTE — Progress Notes (Signed)
Cardiology Office Note   Date:  04/18/2015   ID:  [place], DOB December 23, 1950, MRN [id number]  PCP:  Sherrie Mustache, MD  Cardiologist: Darlin Coco MD  Chief Complaint  Patient presents with  . scheduled follow up      History of Present Illness: Raymond Ray is a 65 y.o. male who presents for a six-month follow-up office visit.  This pleasant 65 year old gentleman is seen for a scheduled followup office visit. He has a history of chronic atrial fibrillation. He first developed atrial fibrillation in 1986. He was asymptomatic and it was found at the time of a routine physical examination. At that time his physician used quinidine to convert him to sinus rhythm. In 1989 he had an episode of tachycardia resulting in an increase in his quinidine dose. He moved to [place] at age 55. He gives a report that in 1986 an echocardiogram done in [place] showed holosystolic mitral valve prolapse. He has been in chronic atrial fibrillation. He remains asymptomatic. He has not had any TIA symptoms. He was on chronic Coumadin anticoagulation for many years and then last year switched to Xarelto. However he had 3 nosebleeds in one week on Xarelto and so he requested to be switched back to Coumadin. His INR goal is 1.8 up to 2.2. We are intentionally keeping it low because of a past history of nosebleeds on Coumadin also. He has not been experiencing any chest discomfort or shortness of breath. He exercises at the gym 3 times a week. Since last visit he has not been experiencing any new cardiac symptoms. No chest pain or shortness of breath. Good exercise tolerance. No dizziness or syncope. He really is not aware of his heart rate. heart rate today is 60.. The patient has had some arthritis of his right hip. He has had some beneficial injections into the right hip in the past.  Patient has had some symptoms of erectile dysfunction and is requesting Viagra..  Past Medical History    Diagnosis Date  . Chronic atrial fibrillation ([place])   . Mitral valve prolapse   . Osteoarthritis of hip   . History of colonoscopy 04/2001    negative  . Allergy     Past Surgical History  Procedure Laterality Date  . Knee surgery      left at age 82 in [place]  . Vasectomy    . Colonoscopy    . Spinal injection      December 2013     Current Outpatient Prescriptions  Medication Sig Dispense Refill  . Ascorbic Acid (VITAMIN C PO) Take 1 tablet by mouth daily.     . B Complex-C (B-COMPLEX WITH VITAMIN C) tablet Take 1 tablet by mouth daily.      . digoxin (LANOXIN) 0.25 MG tablet Take 1/2 tablet on Monday, Wednesday, and Friday only 30 tablet 3  . fexofenadine (ALLEGRA) 180 MG tablet Take 90 mg by mouth daily as needed (for allergies or skin itching).     Marland Kitchen HYDROcodone-acetaminophen (NORCO/VICODIN) 5-325 MG per tablet Take 1 tablet by mouth every 4 (four) hours as needed (for pain).     . metoprolol (LOPRESSOR) 50 MG tablet Take 1/2 tablet by mouth daily 45 tablet 3  . warfarin (COUMADIN) 5 MG tablet Take as directed by  Coumadin Clinic 100 tablet 0  . zolpidem (AMBIEN) 10 MG tablet Take 1 tablet (10 mg total) by mouth at bedtime as needed for sleep. 30 tablet 0  . sildenafil (REVATIO)  20 MG tablet 2 TO 5 TABLETS BY MOUTH AS NEEDED FOR ED 50 tablet 5   No current facility-administered medications for this visit.    Allergies:   Review of patient's allergies indicates no known allergies.    Social History:  The patient  reports that he has never smoked. He has never used smokeless tobacco. He reports that he does not drink alcohol or use illicit drugs.   Family History:  The patient's family history includes Arrhythmia in his father; Breast cancer in his sister; Cancer in his mother; Heart attack in his mother. There is no history of Colon cancer, Esophageal cancer, Stomach cancer, or Rectal cancer.    ROS:  Please see the history of present illness.   Otherwise, review  of systems are positive for none.   All other systems are reviewed and negative.    PHYSICAL EXAM: VS:  BP 108/64 mmHg  Pulse 60  Ht 6\' 3"  (1.905 m)  Wt 180 lb 12.8 oz (82.01 kg)  BMI 22.60 kg/m2 , BMI Body mass index is 22.6 kg/(m^2). GEN: Well nourished, well developed, in no acute distress HEENT: normal Neck: no JVD, carotid bruits, or masses Cardiac: Irregularly irregular with controlled ventricular response.  There is a grade 2/6 mid and late systolic murmur of mitral valve prolapse loudest at the apex.No, rubs, or gallops,no edema  Respiratory:  clear to auscultation bilaterally, normal work of breathing GI: soft, nontender, nondistended, + BS MS: no deformity or atrophy Skin: warm and dry, no rash Neuro:  Strength and sensation are intact Psych: euthymic mood, full affect   EKG:  EKG is not ordered today.    Recent Labs: No results found for requested labs within last 365 days.    Lipid Panel    Component Value Date/Time   CHOL 166 03/03/2011 0922   TRIG 41.0 03/03/2011 0922   HDL 55.80 03/03/2011 0922   CHOLHDL 3 03/03/2011 0922   VLDL 8.2 03/03/2011 0922   LDLCALC 102* 03/03/2011 0922      Wt Readings from Last 3 Encounters:  04/18/15 180 lb 12.8 oz (82.01 kg)  06/28/14 176 lb 12.8 oz (80.196 kg)  02/02/13 181 lb (82.101 kg)        ASSESSMENT AND PLAN:  1. Chronic established permanent atrial fibrillation with controlled ventricular response.  No symptoms.  Rate controlled on low dose of beta blocker and very low-dose of digoxin. 2. Mitral valve prolapse with mitral regurgitation. 3. Erectile dysfunction.  Current medicines are reviewed at length with the patient today.  The patient does not have concerns regarding medicines.  The following changes have been made:  Prescription for sildenafil 20 mg tablet sent to [facility].   Labs/ tests ordered today include:  No orders of the defined types were placed in this encounter.     Disposition:    Following my retirement the patient will follow-up with Dr. Oval Linsey for office visit and EKG in 6 months.  His PCP is Dr. Edrick Oh.  Berna Spare MD 04/18/2015 12:51 PM    [facility] [place], [place], [place]  [address] Phone: [phone]; Fax: [phone]

## 2015-04-18 NOTE — Patient Instructions (Signed)
Medication Instructions:  Your physician recommends that you continue on your current medications as directed. Please refer to the Current Medication list given to you today.  Labwork: NONE  Testing/Procedures: NONE  Follow-Up: Your physician wants you to follow-up in: [place] will receive a reminder letter in the mail two months in advance. If you don't receive a letter, please call our office to schedule the follow-up appointment.  If you need a refill on your cardiac medications before your next appointment, please call your pharmacy.

## 2015-06-25 ENCOUNTER — Ambulatory Visit (INDEPENDENT_AMBULATORY_CARE_PROVIDER_SITE_OTHER): Payer: 59 | Admitting: *Deleted

## 2015-06-25 DIAGNOSIS — Z7901 Long term (current) use of anticoagulants: Secondary | ICD-10-CM

## 2015-06-25 DIAGNOSIS — I4891 Unspecified atrial fibrillation: Secondary | ICD-10-CM | POA: Diagnosis not present

## 2015-06-25 LAB — POCT INR: INR: 2.4

## 2015-07-09 ENCOUNTER — Other Ambulatory Visit: Payer: Self-pay | Admitting: *Deleted

## 2015-07-09 ENCOUNTER — Other Ambulatory Visit: Payer: Self-pay | Admitting: Cardiovascular Disease

## 2015-07-09 MED ORDER — WARFARIN SODIUM 5 MG PO TABS: ORAL_TABLET | ORAL | Status: DC

## 2015-07-10 ENCOUNTER — Telehealth: Payer: Self-pay | Admitting: Pharmacist

## 2015-07-10 NOTE — Telephone Encounter (Signed)
Received message from [facility] with [facility].  Pt is scheduled to have a spinal injection and needs clearance to hold Coumadin x 5 days prior.   Reviewed pt's chart.  He has a CHADS score of 0.  No history of TIA or stroke.  Per protocol, okay to hold Coumadin x 5 days prior to injection.  Pt is aware of this recommendation and to call us with the date once scheduled to coordinate follow up appointment.    Will fax clearance to [facility] at [phone]

## 2015-07-11 ENCOUNTER — Other Ambulatory Visit: Payer: Self-pay | Admitting: Cardiovascular Disease

## 2015-07-11 MED ORDER — WARFARIN SODIUM 5 MG PO TABS: ORAL_TABLET | ORAL | Status: DC

## 2015-07-12 ENCOUNTER — Telehealth: Payer: Self-pay | Admitting: *Deleted

## 2015-07-12 MED ORDER — WARFARIN SODIUM 5 MG PO TABS
ORAL_TABLET | ORAL | Status: DC
Start: 2015-07-12 — End: 2015-10-15

## 2015-07-12 NOTE — Telephone Encounter (Signed)
Patient left a message on the refill vm and is upset that his rx for warfarin has not [facility]. [facility] stated that [facility] to [facility] this. He would like a call back to discuss.

## 2015-07-12 NOTE — Telephone Encounter (Signed)
This was filled at the [facility] office yesterday and sent to [facility].  Will send to that office to clarify where Rx should be sent.

## 2015-07-12 NOTE — Telephone Encounter (Signed)
Home number not connected, LMOM that rx sent to [facility]

## 2015-07-18 ENCOUNTER — Ambulatory Visit (INDEPENDENT_AMBULATORY_CARE_PROVIDER_SITE_OTHER): Payer: 59 | Admitting: Pharmacist

## 2015-07-18 DIAGNOSIS — Z7901 Long term (current) use of anticoagulants: Secondary | ICD-10-CM | POA: Diagnosis not present

## 2015-07-18 DIAGNOSIS — I4891 Unspecified atrial fibrillation: Secondary | ICD-10-CM | POA: Diagnosis not present

## 2015-07-18 LAB — POCT INR: INR: 1.2

## 2015-07-31 ENCOUNTER — Ambulatory Visit (INDEPENDENT_AMBULATORY_CARE_PROVIDER_SITE_OTHER): Payer: 59 | Admitting: Pharmacist

## 2015-07-31 DIAGNOSIS — I4891 Unspecified atrial fibrillation: Secondary | ICD-10-CM

## 2015-07-31 DIAGNOSIS — Z7901 Long term (current) use of anticoagulants: Secondary | ICD-10-CM | POA: Diagnosis not present

## 2015-07-31 LAB — POCT INR: INR: 2.5

## 2015-08-09 ENCOUNTER — Telehealth: Payer: Self-pay | Admitting: *Deleted

## 2015-08-09 NOTE — Telephone Encounter (Signed)
Patient called and stated that Dr Mare Ferrari gave him a written rx for viagra 100 mg, but the pharmacy will not refill it due to the date on it appearing to be altered. The order number patient provided was [id number] and the number to call the pharmacy [phone]. Is this is unsuccessful, he would like to know if Dr Oval Linsey would prescribe this for him. He can be reached at [phone]. Thanks, MI

## 2015-08-09 NOTE — Telephone Encounter (Signed)
Dr Oval Linsey, this pt is due to get established with you in august. Are you okay refill viagra?

## 2015-08-10 NOTE — Telephone Encounter (Signed)
I prefer that this medication be prescribed by PCP or urology.

## 2015-08-13 NOTE — Telephone Encounter (Signed)
Left message for patient to call back  

## 2015-08-14 NOTE — Telephone Encounter (Signed)
Spoke with patient and called pharmacy to verify that  Dr. Mare Ferrari did write Rx 04/18/15 Patient aware PCP will need to order Rx from now on  Left message to call back

## 2015-08-20 NOTE — Telephone Encounter (Signed)
Left another message for patient if he needed anything to call back

## 2015-08-30 ENCOUNTER — Other Ambulatory Visit: Payer: Self-pay | Admitting: *Deleted

## 2015-08-30 MED ORDER — METOPROLOL TARTRATE 50 MG PO TABS: ORAL_TABLET | ORAL | Status: DC

## 2015-09-05 ENCOUNTER — Ambulatory Visit (INDEPENDENT_AMBULATORY_CARE_PROVIDER_SITE_OTHER): Payer: 59 | Admitting: Pharmacist

## 2015-09-05 DIAGNOSIS — Z7901 Long term (current) use of anticoagulants: Secondary | ICD-10-CM

## 2015-09-05 DIAGNOSIS — I4891 Unspecified atrial fibrillation: Secondary | ICD-10-CM | POA: Diagnosis not present

## 2015-09-05 LAB — POCT INR: INR: 2.8

## 2015-10-03 ENCOUNTER — Ambulatory Visit (INDEPENDENT_AMBULATORY_CARE_PROVIDER_SITE_OTHER): Payer: 59 | Admitting: Pharmacist

## 2015-10-03 DIAGNOSIS — Z7901 Long term (current) use of anticoagulants: Secondary | ICD-10-CM

## 2015-10-03 DIAGNOSIS — I4891 Unspecified atrial fibrillation: Secondary | ICD-10-CM | POA: Diagnosis not present

## 2015-10-03 LAB — POCT INR: INR: 1.8

## 2015-10-15 ENCOUNTER — Ambulatory Visit (INDEPENDENT_AMBULATORY_CARE_PROVIDER_SITE_OTHER): Payer: 59 | Admitting: Cardiovascular Disease

## 2015-10-15 ENCOUNTER — Ambulatory Visit: Payer: 59 | Admitting: Pharmacist

## 2015-10-15 ENCOUNTER — Encounter: Payer: Self-pay | Admitting: Cardiovascular Disease

## 2015-10-15 VITALS — BP 110/78 | Ht 75.0 in | Wt 179.8 lb

## 2015-10-15 DIAGNOSIS — I340 Nonrheumatic mitral (valve) insufficiency: Secondary | ICD-10-CM

## 2015-10-15 DIAGNOSIS — Z7901 Long term (current) use of anticoagulants: Secondary | ICD-10-CM | POA: Diagnosis not present

## 2015-10-15 DIAGNOSIS — I482 Chronic atrial fibrillation, unspecified: Secondary | ICD-10-CM

## 2015-10-15 DIAGNOSIS — I4891 Unspecified atrial fibrillation: Secondary | ICD-10-CM

## 2015-10-15 MED ORDER — APIXABAN 5 MG PO TABS: 5.0000 mg | ORAL_TABLET | Freq: Two times a day (BID) | ORAL | 1 refills | Status: DC

## 2015-10-15 MED ORDER — APIXABAN 5 MG PO TABS: 5.0000 mg | ORAL_TABLET | Freq: Two times a day (BID) | ORAL | 3 refills | Status: DC

## 2015-10-15 NOTE — Patient Instructions (Signed)
Medication Instructions:  STOP YOUR WARFARIN STARTING TODAY  START ELIQUIS 5 MG TWICE A DAY (12 HOURS APART) STARTING Wednesday   Labwork: NONE  Testing/Procedures: NONE  Follow-Up: Your physician wants you to follow-up in: [place] will receive a reminder letter in the mail two months in advance. If you don't receive a letter, please call our office to schedule the follow-up appointment.  If you need a refill on your cardiac medications before your next appointment, please call your pharmacy.

## 2015-10-15 NOTE — Progress Notes (Signed)
Cardiology Office Note   Date:  10/15/2015   ID:  Raymond Ray, DOB [birth year], MRN [id number]  PCP:  Sherrie Mustache, MD  Cardiologist:   [name], MD   No chief complaint on file.     History of Present Illness: Raymond Ray is a 65 y.o. male with chronic atrial fibrillation, mitral regurgitation 2/2 MVP, who presents for follow up.  Raymond Ray was previously a patient of Dr. Mare Ferrari.  He first developed atrial fibrillation in 1986.  He was asymptomatic and it was discovered on physical exam.  Raymond Ray had an echo 10/09/09 that showed normal systolic function and posterior mitral valve prolapse with moderate mitral regurgitation and normal PA pressures.  He had a nuclear stress 04/29/02 showing no ischemia and an ejection fraction of 55%.  He developed epistaxis on Xarelto and switched back to Coumadin.  His INR goal has been 1.8-2.2 due to the recurrent epistaxis.    Since his last appointment Raymond Ray has been doing well.  He denies chest pain or lower extremity edema.  He occasionally notes dyspnea when walking up stairs, though this does not happen every time.  He works out three times per week, uses an elliptical, and lifts weights.  He denies chest pain or shortness of breath with these activities.  He has noticed varicose veins and lower extremity edema.  He denies orthopnea or PND.  Past Medical History:  Diagnosis Date  . Allergy   . Chronic atrial fibrillation ([place])   . History of colonoscopy 04/2001   negative  . Mitral valve prolapse   . Osteoarthritis of hip     Past Surgical History:  Procedure Laterality Date  . COLONOSCOPY    . KNEE SURGERY     left at age 52 in [place]  . spinal injection     December 2013  . VASECTOMY       Current Outpatient Prescriptions  Medication Sig Dispense Refill  . Ascorbic Acid (VITAMIN C PO) Take 1 tablet by mouth daily.     . B Complex-C (B-COMPLEX WITH VITAMIN C) tablet Take 1 tablet by mouth daily.       . digoxin (LANOXIN) 0.25 MG tablet Take 1/2 tablet on Monday, Wednesday, and Friday only 30 tablet 3  . fexofenadine (ALLEGRA) 180 MG tablet Take 90 mg by mouth daily as needed (for allergies or skin itching).     Marland Kitchen HYDROcodone-acetaminophen (NORCO/VICODIN) 5-325 MG per tablet Take 1 tablet by mouth every 4 (four) hours as needed (for pain).     . metoprolol (LOPRESSOR) 50 MG tablet Take 1/2 tablet by mouth daily 45 tablet 1  . sildenafil (REVATIO) 20 MG tablet 2 TO 5 TABLETS BY MOUTH AS NEEDED FOR ED 50 tablet 5  . zolpidem (AMBIEN) 10 MG tablet Take 1 tablet (10 mg total) by mouth at bedtime as needed for sleep. 30 tablet 0  . apixaban (ELIQUIS) 5 MG TABS tablet Take 1 tablet (5 mg total) by mouth 2 (two) times daily. 180 tablet 3   No current facility-administered medications for this visit.     Allergies:   Review of patient's allergies indicates no known allergies.    Social History:  The patient  reports that he has never smoked. He has never used smokeless tobacco. He reports that he does not drink alcohol or use drugs.   Family History:  The patient's family history includes Arrhythmia in his father; Breast cancer in his sister; Cancer in his  mother; Heart attack in his mother.    ROS:  Please see the history of present illness.   Otherwise, review of systems are positive for none.   All other systems are reviewed and negative.    PHYSICAL EXAM: VS:  BP 110/78 (BP Location: Left Arm, Cuff Size: Normal)   Ht 6\' 3"  (1.905 m)   Wt 179 lb 12.8 oz (81.6 kg)   BMI 22.47 kg/m  , BMI Body mass index is 22.47 kg/m. GENERAL:  Well appearing HEENT:  Pupils equal round and reactive, fundi not visualized, oral mucosa unremarkable NECK:  No jugular venous distention, waveform within normal limits, carotid upstroke brisk and symmetric, no bruits, no thyromegaly LYMPHATICS:  No cervical adenopathy LUNGS:  Clear to auscultation bilaterally HEART:  RRR.  PMI not displaced or  sustained,S1 and S2 within normal limits, no S3, no S4, no clicks, no rubs, no murmurs ABD:  Flat, positive bowel sounds normal in frequency in pitch, no bruits, no rebound, no guarding, no midline pulsatile mass, no hepatomegaly, no splenomegaly EXT:  2 plus pulses throughout, trace edema, no cyanosis no clubbing L>R varicose and spider veins SKIN:  No rashes no nodules NEURO:  Cranial nerves II through XII grossly intact, motor grossly intact throughout PSYCH:  Cognitively intact, oriented to person place and time    EKG:  EKG is ordered today. The ekg ordered today demonstrates atrial fibrillation rate 63 bpm.     Recent Labs: No results found for requested labs within last 8760 hours.    Lipid Panel    Component Value Date/Time   CHOL 166 03/03/2011 0922   TRIG 41.0 03/03/2011 0922   HDL 55.80 03/03/2011 0922   CHOLHDL 3 03/03/2011 0922   VLDL 8.2 03/03/2011 0922   LDLCALC 102 (H) 03/03/2011 0922      Wt Readings from Last 3 Encounters:  10/15/15 179 lb 12.8 oz (81.6 kg)  04/18/15 180 lb 12.8 oz (82 kg)  06/28/14 176 lb 12.8 oz (80.2 kg)      ASSESSMENT AND PLAN:  # Atrial fibrillation: Rates are well-controlled and he remains asymptomatic.  He is interested in trying an anticoagulant that doesn't require frequent monitoring.  He had epistaxis on Xarelto.  We will start Eliquis 5 mg bid.  He recently had lab work with his PCP.  We will obtain this to verify his renal function.  He will hold warfarin tonight and tomorrow and start Eliquis on Wednesday.  This patients CHA2DS2-VASc Score and unadjusted Ischemic Stroke Rate (% per year) is equal to 0.6 % stroke rate/year from a score of 1  Above score calculated as 1 point each if present [CHF, HTN, DM, Vascular=MI/PAD/Aortic Plaque, Age if 65-74, or Male] Above score calculated as 2 points each if present [Age > 75, or Stroke/TIA/TE]  # Mitral regurgitation: No evidence of heart failure on exam.  Continue to  monitor.   Current medicines are reviewed at length with the patient today.  The patient does not have concerns regarding medicines.  The following changes have been made:  Stop warfarin and start Eliquis  Labs/ tests ordered today include:  No orders of the defined types were placed in this encounter.    Disposition:   FU with Adama Ferber C. Oval Linsey, MD, [facility] in 6 months.    This note was written with the assistance of speech recognition software.  Please excuse any transcriptional errors.  Signed, Naturi Alarid C. Oval Linsey, MD, [facility]  10/15/2015 4:07 PM    [facility]

## 2015-10-15 NOTE — Addendum Note (Signed)
Addended by: Alvina Filbert B on: 10/15/2015 04:23 PM   Modules accepted: Orders

## 2015-10-15 NOTE — Addendum Note (Signed)
Addended by: Alvina Filbert B on: 10/15/2015 04:51 PM   Modules accepted: Orders

## 2015-11-09 ENCOUNTER — Other Ambulatory Visit: Payer: Self-pay

## 2015-11-09 MED ORDER — DIGOXIN 250 MCG PO TABS: ORAL_TABLET | ORAL | 3 refills | Status: DC

## 2016-02-27 ENCOUNTER — Other Ambulatory Visit: Payer: Self-pay | Admitting: Cardiovascular Disease

## 2016-02-27 NOTE — Telephone Encounter (Signed)
Review for refill. 

## 2016-02-27 NOTE — Telephone Encounter (Signed)
REFILL 

## 2016-03-18 ENCOUNTER — Other Ambulatory Visit: Payer: Self-pay

## 2016-03-18 MED ORDER — METOPROLOL TARTRATE 50 MG PO TABS: 25.0000 mg | ORAL_TABLET | Freq: Every day | ORAL | 3 refills | Status: DC

## 2016-08-27 ENCOUNTER — Other Ambulatory Visit: Payer: Self-pay | Admitting: Cardiovascular Disease

## 2016-08-28 NOTE — Telephone Encounter (Signed)
Please review for refill. Thanks!  

## 2016-09-20 ENCOUNTER — Other Ambulatory Visit: Payer: Self-pay | Admitting: Cardiovascular Disease

## 2016-09-22 ENCOUNTER — Telehealth: Payer: Self-pay | Admitting: Cardiovascular Disease

## 2016-09-22 NOTE — Telephone Encounter (Signed)
PATIENT RETURN CALL  patient states he contacted insurance company  Here is a list of medication in patient 's formulary. Patient states he has  A couple weeks worth of medication until a decision is made.  Patient states he would do what Dr Oval Linsey  Recommends.  WARFARIN $25 90 DAY SUPPLY GATOVEN $25   90 DAY SUPPLY  XARELTO  $88   90 DAY SUPPLY PRADAXA $88    90 DAY SUPPLY  ELIQUIS  $375   90 DAY SUPPLY     PATIENT AWARE WILL DEFER TO DR [facility] AND CALL HIM BACK

## 2016-09-22 NOTE — Telephone Encounter (Signed)
Left message to call back . ? Whether patient contact insurance to see if another medication was different tier in he same class.  awaiting return  call

## 2016-09-22 NOTE — Telephone Encounter (Signed)
If $88/month is a possibility, I would recommend Xarelto 20mg  daily.  This is very similar to Eliquis and doesn't require dietary changes or frequent monitoring.  If he prefers warfarin, that is OK too.

## 2016-09-22 NOTE — Telephone Encounter (Signed)
Please review for refill, Thanks !  

## 2016-09-22 NOTE — Telephone Encounter (Signed)
New message    Pt is calling about his medication.  Pt c/o medication issue:  1. Name of Medication: Eliquis   2. How are you currently taking this medication (dosage and times per day)? 5 mg  3. Are you having a reaction (difficulty breathing--STAT)? no  4. What is your medication issue? Pt is calling stating that the cost of this has gone up twice. He states he wants to know if he can go back to warfarin, or talk to the nurse about going back on it.

## 2016-09-22 NOTE — Telephone Encounter (Signed)
Spoke to patient. Patient states that he spoke to [place] [facility] . I t will cost him several hundreds dollars a month. Patient states he has used warfarin in the past - this medication will only cost $25 a month.   He is aware will need to th have  protime checked  --  ( in the past he was taking 5 mg,6 days, and 7.5 mg on the 7th day )  Patient will call office back to see if if warfarin is the only tier 1  Medication and call back. Patient wil defer to Dr Oval Linsey once all information is obtained

## 2016-09-23 NOTE — Telephone Encounter (Signed)
Unable to leave message on cell number, mailbox full  Home number not working

## 2016-09-24 MED ORDER — RIVAROXABAN 20 MG PO TABS: 20.0000 mg | ORAL_TABLET | Freq: Every day | ORAL | 1 refills | Status: DC

## 2016-09-24 NOTE — Telephone Encounter (Signed)
Spoke with patient and he is willing to try the Xarelto 20 mg daily Rx sent to [facility] and he will start when he finishes his current supply of Eliquis

## 2016-10-09 ENCOUNTER — Telehealth: Payer: Self-pay | Admitting: *Deleted

## 2016-10-09 NOTE — Telephone Encounter (Signed)
Left message to call back This medication will need to be obtained from PCP

## 2016-10-09 NOTE — Telephone Encounter (Signed)
Patient left a msg on the refill vm requesting a refill on oxycodone be sent to [facility] on [address]. Thanks, MI

## 2016-10-09 NOTE — Telephone Encounter (Signed)
Spoke with patient and he meant to call Dr Nelva Bush office for requst

## 2016-12-15 ENCOUNTER — Other Ambulatory Visit: Payer: Self-pay | Admitting: *Deleted

## 2016-12-15 MED ORDER — DIGOXIN 250 MCG PO TABS
ORAL_TABLET | ORAL | 4 refills | Status: DC
Start: 2016-12-15 — End: 2018-01-07

## 2016-12-24 ENCOUNTER — Other Ambulatory Visit: Payer: Self-pay

## 2017-01-02 ENCOUNTER — Telehealth: Payer: Self-pay | Admitting: *Deleted

## 2017-01-02 NOTE — Telephone Encounter (Signed)
    Chart reviewed as part of pre-operative protocol coverage. Because of Raymond Ray past medical history and time since last visit (>91yr), he/she will require a follow-up visit in order to better assess preoperative cardiovascular risk.   Pre-op covering staff: - Please schedule appointment and call patient to inform them. - Please contact requesting surgeon's office via preferred method (i.e, phone, fax) to inform them of need for appointment prior to surgery.  Murray Hodgkins, NP  01/02/2017, 4:05 PM

## 2017-01-02 NOTE — Telephone Encounter (Signed)
   [facility] Pre-operative Risk Assessment    Request for surgical clearance:  1. What type of surgery is being performed? R total hip arthroplasty   2. When is this surgery scheduled? TBD   3. Are there any medications that need to be held prior to surgery and how long? Xarelto  4. Practice name and name of physician performing surgery? Dr. Rod Can at [facility]   5. What is your office phone and fax number? [phone]   6. Anesthesia type (None, local, MAC, general) ? general   Shakiara Lukic A Meztli Llanas 01/02/2017, 12:32 PM  _________________________________________________________________   (provider comments below)  Appointment with Dr. Oval Linsey 11/14

## 2017-01-05 NOTE — Telephone Encounter (Signed)
aptt was already scheduled for Dr. Oval Linsey for 01/07/17, and preop clearance will be addressed at that time.

## 2017-01-07 ENCOUNTER — Encounter: Payer: Self-pay | Admitting: Cardiovascular Disease

## 2017-01-07 ENCOUNTER — Emergency Department (HOSPITAL_COMMUNITY)
Admission: EM | Admit: 2017-01-07 | Discharge: 2017-01-07 | Disposition: A | Discharge status: Home / Self Care | Payer: 59 | Attending: Emergency Medicine | Admitting: Emergency Medicine

## 2017-01-07 ENCOUNTER — Encounter: Payer: Self-pay | Admitting: *Deleted

## 2017-01-07 ENCOUNTER — Emergency Department (HOSPITAL_COMMUNITY): Payer: 59

## 2017-01-07 ENCOUNTER — Ambulatory Visit: Payer: 59 | Admitting: Cardiovascular Disease

## 2017-01-07 VITALS — Ht 75.0 in

## 2017-01-07 DIAGNOSIS — I4891 Unspecified atrial fibrillation: Secondary | ICD-10-CM | POA: Diagnosis not present

## 2017-01-07 DIAGNOSIS — Z79891 Long term (current) use of opiate analgesic: Secondary | ICD-10-CM | POA: Diagnosis not present

## 2017-01-07 DIAGNOSIS — I482 Chronic atrial fibrillation, unspecified: Secondary | ICD-10-CM

## 2017-01-07 DIAGNOSIS — Z01818 Encounter for other preprocedural examination: Secondary | ICD-10-CM

## 2017-01-07 DIAGNOSIS — R42 Dizziness and giddiness: Secondary | ICD-10-CM | POA: Insufficient documentation

## 2017-01-07 DIAGNOSIS — Z5181 Encounter for therapeutic drug level monitoring: Secondary | ICD-10-CM | POA: Diagnosis not present

## 2017-01-07 HISTORY — DX: Dizziness and giddiness: R42

## 2017-01-07 LAB — CBC WITH DIFFERENTIAL/PLATELET
BASOS: 0 %
Basophils Absolute: 0 10*3/uL (ref 0.0–0.2)
EOS (ABSOLUTE): 0 10*3/uL (ref 0.0–0.4)
EOS: 0 %
HEMATOCRIT: 32.7 % — AB (ref 37.5–51.0)
Hemoglobin: 11.3 g/dL — ABNORMAL LOW (ref 13.0–17.7)
IMMATURE GRANS (ABS): 0 10*3/uL (ref 0.0–0.1)
IMMATURE GRANULOCYTES: 0 %
LYMPHS: 10 %
Lymphocytes Absolute: 0.5 10*3/uL — ABNORMAL LOW (ref 0.7–3.1)
MCH: 35.4 pg — ABNORMAL HIGH (ref 26.6–33.0)
MCHC: 34.6 g/dL (ref 31.5–35.7)
MCV: 103 fL — AB (ref 79–97)
MONOS ABS: 0.3 10*3/uL (ref 0.1–0.9)
Monocytes: 5 %
NEUTROS PCT: 85 %
Neutrophils Absolute: 4.2 10*3/uL (ref 1.4–7.0)
PLATELETS: 234 10*3/uL (ref 150–379)
RBC: 3.19 x10E6/uL — AB (ref 4.14–5.80)
RDW: 15 % (ref 12.3–15.4)
WBC: 5 10*3/uL (ref 3.4–10.8)

## 2017-01-07 LAB — COMPREHENSIVE METABOLIC PANEL
A/G RATIO: 2.2 (ref 1.2–2.2)
ALBUMIN: 3.9 g/dL (ref 3.5–5.0)
ALK PHOS: 51 U/L (ref 38–126)
ALT: 9 IU/L (ref 0–44)
ALT: 13 U/L — AB (ref 17–63)
AST: 17 IU/L (ref 0–40)
AST: 19 U/L (ref 15–41)
Albumin: 3.9 g/dL (ref 3.6–4.8)
Alkaline Phosphatase: 55 IU/L (ref 39–117)
Anion gap: 4 — ABNORMAL LOW (ref 5–15)
BUN/Creatinine Ratio: 22 (ref 10–24)
BUN: 25 mg/dL (ref 8–27)
BUN: 23 mg/dL — AB (ref 6–20)
Bilirubin Total: 0.9 mg/dL (ref 0.0–1.2)
CALCIUM: 9 mg/dL (ref 8.6–10.2)
CALCIUM: 9.2 mg/dL (ref 8.9–10.3)
CO2: 23 mmol/L (ref 20–29)
CO2: 27 mmol/L (ref 22–32)
CREATININE: 1.13 mg/dL (ref 0.76–1.27)
CREATININE: 1.1 mg/dL (ref 0.61–1.24)
Chloride: 106 mmol/L (ref 96–106)
Chloride: 109 mmol/L (ref 101–111)
GFR calc Af Amer: >60 mL/min (ref >60)
GFR calc non Af Amer: >60 mL/min (ref >60)
GFR, EST AFRICAN AMERICAN: 78 mL/min/{1.73_m2} (ref >59)
GFR, EST NON AFRICAN AMERICAN: 68 mL/min/{1.73_m2} (ref >59)
GLUCOSE: 116 mg/dL — AB (ref 65–99)
Globulin, Total: 1.8 g/dL (ref 1.5–4.5)
Glucose: 112 mg/dL — ABNORMAL HIGH (ref 65–99)
Potassium: 4.6 mmol/L (ref 3.5–5.2)
Potassium: 4.8 mmol/L (ref 3.5–5.1)
SODIUM: 140 mmol/L (ref 135–145)
Sodium: 142 mmol/L (ref 134–144)
TOTAL PROTEIN: 5.7 g/dL — AB (ref 6.0–8.5)
Total Bilirubin: 1.5 mg/dL — ABNORMAL HIGH (ref 0.3–1.2)
Total Protein: 6 g/dL — ABNORMAL LOW (ref 6.5–8.1)

## 2017-01-07 LAB — DIFFERENTIAL
Basophils Absolute: 0 10*3/uL (ref 0.0–0.1)
Basophils Relative: 0 %
Eosinophils Absolute: 0 10*3/uL (ref 0.0–0.7)
Eosinophils Relative: 0 %
Lymphocytes Relative: 10 %
Lymphs Abs: 0.7 10*3/uL (ref 0.7–4.0)
Monocytes Absolute: 0.2 10*3/uL (ref 0.1–1.0)
Monocytes Relative: 3 %
NEUTROS ABS: 5.9 10*3/uL (ref 1.7–7.7)
NEUTROS PCT: 87 %

## 2017-01-07 LAB — CBG MONITORING, ED: Glucose-Capillary: 106 mg/dL — ABNORMAL HIGH (ref 65–99)

## 2017-01-07 LAB — I-STAT CHEM 8, ED
BUN: 27 mg/dL — AB (ref 6–20)
CHLORIDE: 106 mmol/L (ref 101–111)
CREATININE: 1 mg/dL (ref 0.61–1.24)
Calcium, Ion: 1.24 mmol/L (ref 1.15–1.40)
GLUCOSE: 112 mg/dL — AB (ref 65–99)
HCT: 35 % — ABNORMAL LOW (ref 39.0–52.0)
Hemoglobin: 11.9 g/dL — ABNORMAL LOW (ref 13.0–17.0)
POTASSIUM: 4.8 mmol/L (ref 3.5–5.1)
Sodium: 140 mmol/L (ref 135–145)
TCO2: 26 mmol/L (ref 22–32)

## 2017-01-07 LAB — PROTIME-INR
INR: 1.55
PROTHROMBIN TIME: 18.4 s — AB (ref 11.4–15.2)

## 2017-01-07 LAB — DIGOXIN LEVEL
Digoxin Level: <0.2 ng/mL — ABNORMAL LOW (ref 0.8–2.0)
Digoxin, Serum: 0.4 ng/mL — ABNORMAL LOW (ref 0.5–0.9)

## 2017-01-07 LAB — CBC
HCT: 34.2 % — ABNORMAL LOW (ref 39.0–52.0)
HEMOGLOBIN: 11.4 g/dL — AB (ref 13.0–17.0)
MCH: 33.9 pg (ref 26.0–34.0)
MCHC: 33.3 g/dL (ref 30.0–36.0)
MCV: 101.8 fL — ABNORMAL HIGH (ref 78.0–100.0)
PLATELETS: 260 10*3/uL (ref 150–400)
RBC: 3.36 MIL/uL — AB (ref 4.22–5.81)
RDW: 14 % (ref 11.5–15.5)
WBC: 6.8 10*3/uL (ref 4.0–10.5)

## 2017-01-07 LAB — APTT: aPTT: 36 seconds (ref 24–36)

## 2017-01-07 LAB — I-STAT TROPONIN, ED: Troponin i, poc: 0 ng/mL (ref 0.00–0.08)

## 2017-01-07 MED ORDER — MECLIZINE HCL 25 MG PO TABS: 25.0000 mg | ORAL_TABLET | Freq: Three times a day (TID) | ORAL | 0 refills | Status: DC | PRN

## 2017-01-07 MED ORDER — LORAZEPAM 2 MG/ML IJ SOLN
1.0000 mg | Freq: Once | INTRAMUSCULAR | Status: AC
  Administered 2017-01-07: 1 mg via INTRAVENOUS
  Filled 2017-01-07: qty 1

## 2017-01-07 NOTE — ED Triage Notes (Addendum)
Pt sent from dr crossley's office after seeing primary dr, had an EKG and labs drawn at cardiologist's office. Is having hip surgery in 2 weeks, woke up around around 5am with room spinning, dizziness, with vomiting.

## 2017-01-07 NOTE — Progress Notes (Signed)
Cardiology Office Note   Date:  01/07/2017   ID:  Raymond Ray, DOB Apr 14, 1950, MRN [id number]  PCP:  Raymond Housekeeper, MD  Cardiologist:   Raymond Latch, MD   Chief Complaint  Patient presents with  . Medical Clearance    hip replacement.       History of Present Illness: Raymond Ray is a 66 y.o. male with chronic atrial fibrillation and mitral regurgitation 2/2 MVP who presents for follow up.  Raymond Ray was previously a patient of Dr. Mare Ray.  He first developed atrial fibrillation in 1986.  He was asymptomatic and it was discovered on physical exam.  Raymond Ray had an echo 10/09/09 that showed normal systolic function and posterior mitral valve prolapse with moderate mitral regurgitation and normal PA pressures.  He had a nuclear stress 04/29/02 showing no ischemia and an ejection fraction of 55%.  He developed epistaxis on Xarelto and switched back to Coumadin with an INR goal of 1.8-2.2.  However, at his last appointment he was switched to Eliquis.  Since then he has experienced one mild nose bleed in the setting of accidentally taking an extra dose.  This morning Raymond Ray tried to get out the bed and became acutely dizzy.  He felt as though the room was spinning when he tried to turn his head.  He felt like he was walking to the left when he tried to walk straight.  The dizziness was worse when turning his head to the left.  He feels mildly lightheaded upon standing but his dizziness is much worse when lying back or leaning his head backwards than when moving from a seated position to standing.  While getting into the exam room he became acute ill and vomited.  He has not experienced any fever or chills.  He denies sinus congestion or drainage.  He denies slurred speech weakness, numbness, or tingling.  Ordinarily he would have canceled his appointment today but he is here for presurgical clearance.  He needs to have his hip replaced and his surgeon requested surgical clearance  prior to this procedure.  [name] is typically very physically active.  He exercises by doing cardio 3 days/week.  He also works out with a Clinical research associate 2 days/week.  He has no chest pain or shortness of breath with these activities.  He has not experienced any lower extremity edema, orthopnea, or PND.   Past Medical History:  Diagnosis Date  . Allergy   . Chronic atrial fibrillation ([place])   . History of colonoscopy 04/2001   negative  . Mitral valve prolapse   . Osteoarthritis of hip   . Vertigo 01/07/2017    Past Surgical History:  Procedure Laterality Date  . COLONOSCOPY    . KNEE SURGERY     left at age 38 in [place]  . spinal injection     December 2013  . VASECTOMY       No current facility-administered medications for this visit.    Current Outpatient Medications  Medication Sig Dispense Refill  . acetaminophen (TYLENOL) 325 MG tablet Take 650 mg every 6 (six) hours as needed by mouth for mild pain.    Marland Kitchen apixaban (ELIQUIS) 5 MG TABS tablet Take 1 tablet (5 mg total) by mouth 2 (two) times daily. (Patient not taking: Reported on 01/07/2017) 60 tablet 1  . Ascorbic Acid (VITAMIN C PO) Take 1 tablet by mouth daily.     . B Complex-C (B-COMPLEX WITH VITAMIN C) tablet  Take 1 tablet by mouth daily.      . digoxin (DIGOX) 0.25 MG tablet TAKE ONE-HALF TABLET BY  MOUTH ON MONDAY, WEDNESDAY, AND FRIDAY ONLY 20 tablet 4  . fexofenadine (ALLEGRA) 180 MG tablet Take 90 mg by mouth daily as needed (for allergies or skin itching).     Marland Kitchen HYDROcodone-acetaminophen (NORCO/VICODIN) 5-325 MG per tablet Take 1 tablet by mouth every 4 (four) hours as needed (for pain).     . metoprolol (LOPRESSOR) 50 MG tablet Take 0.5 tablets (25 mg total) by mouth daily. 45 tablet 3  . Multiple Vitamin (MULTIVITAMIN) tablet Take 1 tablet daily by mouth.    . promethazine (PHENERGAN) 25 MG suppository Place 25 mg daily as needed rectally for nausea.    . rivaroxaban (XARELTO) 20 MG TABS tablet Take 1  tablet (20 mg total) by mouth daily with supper. 90 tablet 1  . sildenafil (REVATIO) 20 MG tablet 2 TO 5 TABLETS BY MOUTH AS NEEDED FOR ED (Patient not taking: Reported on 01/07/2017) 50 tablet 5  . zolpidem (AMBIEN) 10 MG tablet Take 1 tablet (10 mg total) by mouth at bedtime as needed for sleep. 30 tablet 0   Facility-Administered Medications Ordered in Other Visits  Medication Dose Route Frequency Provider Last Rate Last Dose  . LORazepam (ATIVAN) injection 1 mg  1 mg Intravenous Once Orlie Dakin, MD        Allergies:   Patient has no known allergies.    Social History:  The patient  reports that  has never smoked. he has never used smokeless tobacco. He reports that he does not drink alcohol or use drugs.   Family History:  The patient's family history includes Arrhythmia in his father; Breast cancer in his sister; Cancer in his mother; Heart attack in his mother.    ROS:  Please see the history of present illness.   Otherwise, review of systems are positive for none.   All other systems are reviewed and negative.    PHYSICAL EXAM: VS:  Ht 6\' 3"  (1.905 m)   BMI 22.47 kg/m  , BMI Body mass index is 22.47 kg/m.  Lying blood pressure 98/66, heart rate 79 Sitting blood pressure 111/76, heart rate 83 Standing 115/73, heart rate 90   GENERAL: Ill-appearing HEENT: Pupils equal round and reactive, fundi not visualized, oral mucosa unremarkable.  +Nystagmus with lateral gaze NECK:  No jugular venous distention, waveform within normal limits, carotid upstroke brisk and symmetric, no bruits, no thyromegaly LYMPHATICS:  No cervical adenopathy LUNGS:  Clear to auscultation bilaterally HEART:  RRR.  PMI not displaced or sustained,S1 and S2 within normal limits, no S3, no S4, no clicks, no rubs, no murmurs ABD:  Flat, positive bowel sounds normal in frequency in pitch, no bruits, no rebound, no guarding, no midline pulsatile mass, no hepatomegaly, no splenomegaly EXT:  2 plus pulses  throughout, no edema, no cyanosis no clubbing SKIN:  No rashes no nodules NEURO:  Cranial nerves II through XII grossly intact, motor grossly intact throughout.  Emesis with Epley maneuvers and head movement to the left.  PSYCH:  Cognitively intact, oriented to person place and time   EKG:  EKG is ordered today. The ekg ordered 10/15/15 demonstrates atrial fibrillation rate 63 bpm.   01/07/17: Atrial fibrillation.  Rate 79 bpm.    Recent Labs: 01/07/2017: ALT 13; BUN 27; Creatinine, Ser 1.00; Hemoglobin 11.9; Platelets 260; Potassium 4.8; Sodium 140    Lipid Panel    Component Value  Date/Time   CHOL 166 03/03/2011 0922   TRIG 41.0 03/03/2011 0922   HDL 55.80 03/03/2011 0922   CHOLHDL 3 03/03/2011 0922   VLDL 8.2 03/03/2011 0922   LDLCALC 102 (H) 03/03/2011 0922      Wt Readings from Last 3 Encounters:  10/15/15 81.6 kg (179 lb 12.8 oz)  04/18/15 82 kg (180 lb 12.8 oz)  06/28/14 80.2 kg (176 lb 12.8 oz)      ASSESSMENT AND PLAN:  # Vertigo: Raymond Ray has vertigo by history and exam.  The trigger is unclear.  He has nystagmus on exam.  We tried doing Epley maneuvers in the office and he had emesis with movement of his head to the left.  He will continue trying this 3 times daily.  He has arranged to see his ENT this afternoon.  Will defer further treatment and testing to him.   # Atrial fibrillation: Rates remain well-controlled and he is asymptomatic. Continue Eliquis.  We will check a digoxin level, CMP and CBC today.  Continue metoprolol and digoxin.  OK to hold Eliquis for 3 days prior to hip surgery.  This patients CHA2DS2-VASc Score and unadjusted Ischemic Stroke Rate (% per year) is equal to 0.6 % stroke rate/year from a score of 1  Above score calculated as 1 point each if present [CHF, HTN, DM, Vascular=MI/PAD/Aortic Plaque, Age if 65-74, or Male] Above score calculated as 2 points each if present [Age > 75, or Stroke/TIA/TE]  # Mitral regurgitation: No evidence of  heart failure on exam.  Continue to monitor.  # Pre-surgical risk assessment:  The patient does not have any unstable cardiac conditions.  Upon evaluation today, he can achieve 4 METs or greater without anginal symptoms.  According to [facility] and AHA guidelines, he requires no further cardiac workup prior to his noncardiac surgery and should be at acceptable risk.  his NSQIP risk of peri-procedural MI or cardiac arrest is 0.1%.  Our service is available as necessary in the perioperative period.   Current medicines are reviewed at length with the patient today.  The patient does not have concerns regarding medicines.  The following changes have been made: none  Labs/ tests ordered today include:   Orders Placed This Encounter  Procedures  . CBC with Differential/Platelet  . Comprehensive metabolic panel  . Digoxin level  . EKG 12-Lead     Disposition:   FU with Raymond Gamblin C. Oval Linsey, MD, [facility] in 1 year.  Time spent: 45 minutes-Greater than 50% of this time was spent in counseling, explanation of diagnosis, planning of further management, and coordination of care.   Signed, Shaquanta Harkless C. Oval Linsey, MD, [facility]  01/07/2017 6:00 PM    [place]

## 2017-01-07 NOTE — ED Provider Notes (Signed)
[facility] EMERGENCY DEPARTMENT Provider Note   CSN: [id number] Arrival date & time: 01/07/17  1150     History   Chief Complaint Chief Complaint  Patient presents with  . Dizziness    HPI Raymond Ray is a 66 y.o. male.  HPI Complains of dizziness meaning feeling of head spinning onset upon awakening 5 AM today.  Symptoms have improved steadily with time.  Symptoms improved with remaining still and worse with changing positions.  No treatment prior to coming here.  He was seen by Dr. Ernesto Rutherford, ENT physician this morning sent here for further evaluation.  Symptoms have improved since onset.  He is no longer nauseated. Past Medical History:  Diagnosis Date  . Allergy   . Chronic atrial fibrillation ([place])   . History of colonoscopy 04/2001   negative  . Mitral valve prolapse   . Osteoarthritis of hip     Patient Active Problem List   Diagnosis Date Noted  . Erectile dysfunction 04/18/2015  . Metatarsalgia 05/14/2011  . 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 Mitral regurgitation 08/07/2010  . Osteoarthritis 08/07/2010    Past Surgical History:  Procedure Laterality Date  . COLONOSCOPY    . KNEE SURGERY     left at age 43 in [place]  . spinal injection     December 2013  . VASECTOMY         Home Medications    Prior to Admission medications   Medication Sig Start Date End Date Taking? Authorizing Provider  acetaminophen (TYLENOL) 325 MG tablet Take 650 mg every 6 (six) hours as needed by mouth for mild pain.   Yes [provider]  Ascorbic Acid (VITAMIN C PO) Take 1 tablet by mouth daily.    Yes [provider]  B Complex-C (B-COMPLEX WITH VITAMIN C) tablet Take 1 tablet by mouth daily.     Yes [provider]  digoxin ([place]) 0.25 MG tablet TAKE ONE-HALF TABLET BY  MOUTH ON MONDAY, [place], AND FRIDAY ONLY 12/15/16  Yes [name], MD  fexofenadine (ALLEGRA) 180 MG tablet Take 90 mg by mouth  daily as needed (for allergies or skin itching).    Yes [provider]  HYDROcodone-acetaminophen (NORCO/VICODIN) 5-325 MG per tablet Take 1 tablet by mouth every 4 (four) hours as needed (for pain).  09/20/12  Yes [provider]  metoprolol (LOPRESSOR) 50 MG tablet Take 0.5 tablets (25 mg total) by mouth daily. 03/18/16  Yes [name], MD  Multiple Vitamin (MULTIVITAMIN) tablet Take 1 tablet daily by mouth.   Yes [provider]  promethazine (PHENERGAN) 25 MG suppository Place 25 mg daily as needed rectally for nausea. 01/07/17  Yes [provider]  rivaroxaban (XARELTO) 20 MG TABS tablet Take 1 tablet (20 mg total) by mouth daily with supper. 09/24/16  Yes [name], MD  zolpidem (AMBIEN) 10 MG tablet Take 1 tablet (10 mg total) by mouth at bedtime as needed for sleep. 06/05/14  Yes Darlin Coco, MD  apixaban (ELIQUIS) 5 MG TABS tablet Take 1 tablet (5 mg total) by mouth 2 (two) times daily. Patient not taking: Reported on 01/07/2017 10/15/15   [name], MD  sildenafil (REVATIO) 20 MG tablet 2 TO 5 TABLETS BY MOUTH AS NEEDED FOR ED Patient not taking: Reported on 01/07/2017 04/18/15   Darlin Coco, MD    Family History Family History  Problem Relation Age of Onset  . Arrhythmia Father  tachycardia  . Cancer Mother   . Heart attack Mother   . Breast cancer Sister   . Colon cancer Neg Hx   . Esophageal cancer Neg Hx   . Stomach cancer Neg Hx   . Rectal cancer Neg Hx     Social History Social History   Tobacco Use  . Smoking status: Never Smoker  . Smokeless tobacco: Never Used  Substance Use Topics  . Alcohol use: No  . Drug use: No     Allergies   Patient has no known allergies.   Review of Systems Review of Systems  Constitutional: Negative.   HENT: Negative.   Respiratory: Negative.   Cardiovascular: Negative.   Gastrointestinal: Positive for vomiting.       No longer nauseated    Musculoskeletal: Negative.   Skin: Negative.   Neurological: Positive for dizziness.  Psychiatric/Behavioral: Negative.      Physical Exam Updated Vital Signs BP 114/68 (BP Location: Right Arm)   Pulse 74   Temp 98 F (36.7 C) (Oral)   Resp 20   SpO2 98%   Physical Exam  Constitutional: He is oriented to person, place, and time. He appears well-developed and well-nourished.  HENT:  Head: Normocephalic and atraumatic.  Eyes: Conjunctivae are normal. Pupils are equal, round, and reactive to light.  Neck: Neck supple. No tracheal deviation present. No thyromegaly present.  Cardiovascular: Normal rate.  No murmur heard. Irregularly irregular  Pulmonary/Chest: Effort normal and breath sounds normal.  Abdominal: Soft. Bowel sounds are normal. He exhibits no distension. There is no tenderness.  Musculoskeletal: Normal range of motion. He exhibits no edema or tenderness.  Neurological: He is alert and oriented to person, place, and time. Coordination normal.  Gait normal Romberg normal pronator drift normal finger to nose normal DTR symmetric bilaterally at knee jerk and Richard biceps was downgoing bilaterally  Skin: Skin is warm and dry. No rash noted.  Psychiatric: He has a normal mood and affect.  Nursing note and vitals reviewed.    ED Treatments / Results  Labs (all labs ordered are listed, but only abnormal results are displayed) Labs Reviewed  PROTIME-INR - Abnormal; Notable for the following components:      Result Value   Prothrombin Time 18.4 (*)    All other components within normal limits  CBC - Abnormal; Notable for the following components:   RBC 3.36 (*)    Hemoglobin 11.4 (*)    HCT 34.2 (*)    MCV 101.8 (*)    All other components within normal limits  COMPREHENSIVE METABOLIC PANEL - Abnormal; Notable for the following components:   Glucose, Bld 116 (*)    BUN 23 (*)    Total Protein 6.0 (*)    ALT 13 (*)    Total Bilirubin 1.5 (*)    Anion gap 4  (*)    All other components within normal limits  CBG MONITORING, ED - Abnormal; Notable for the following components:   Glucose-Capillary 106 (*)    All other components within normal limits  I-STAT CHEM 8, ED - Abnormal; Notable for the following components:   BUN 27 (*)    Glucose, Bld 112 (*)    Hemoglobin 11.9 (*)    HCT 35.0 (*)    All other components within normal limits  APTT  DIFFERENTIAL  I-STAT TROPONIN, ED    EKG  EKG Interpretation  Date/Time:  Wednesday January 07 2017 14:37:36 EST Ventricular Rate:  82 PR Interval:  QRS Duration: 90 QT Interval:  382 QTC Calculation: 447 R Axis:   74 Text Interpretation:  Atrial fibrillation LVH by voltage No old tracing to compare Confirmed by Orlie Dakin [phone]) on 01/07/2017 2:43:38 PM Also confirmed by Orlie Dakin [phone]), editor Laurena Spies [phone])  on 01/07/2017 3:13:38 PM       Radiology Ct Head Wo Contrast  Result Date: 01/07/2017 CLINICAL DATA:  Vomiting and dizziness. EXAM: CT HEAD WITHOUT CONTRAST TECHNIQUE: Contiguous axial images were obtained from the base of the skull through the vertex without intravenous contrast. COMPARISON:  None. FINDINGS: Brain: No evidence of acute infarction, hemorrhage, hydrocephalus, extra-axial collection or mass lesion/mass effect. Vascular: No hyperdense vessel or unexpected calcification. Skull: Normal. Negative for fracture or focal lesion. Sinuses/Orbits: No acute finding. Other: None. IMPRESSION: No acute intracranial abnormality. Electronically Signed   By: Titus Dubin M.D.   On: 01/07/2017 13:55    Procedures Procedures (including critical care time)  Medications Ordered in ED Medications  LORazepam (ATIVAN) injection 1 mg (not administered)   Results for orders placed or performed during the hospital encounter of 01/07/17  Protime-INR  Result Value Ref Range   Prothrombin Time 18.4 (H) 11.4 - 15.2 seconds   INR 1.55   APTT  Result Value Ref  Range   aPTT 36 24 - 36 seconds  CBC  Result Value Ref Range   WBC 6.8 4.0 - 10.5 K/uL   RBC 3.36 (L) 4.22 - 5.81 MIL/uL   Hemoglobin 11.4 (L) 13.0 - 17.0 g/dL   HCT 34.2 (L) 39.0 - 52.0 %   MCV 101.8 (H) 78.0 - 100.0 fL   MCH 33.9 26.0 - 34.0 pg   MCHC 33.3 30.0 - 36.0 g/dL   RDW 14.0 11.5 - 15.5 %   Platelets 260 150 - 400 K/uL  Differential  Result Value Ref Range   Neutrophils Relative % 87 %   Neutro Abs 5.9 1.7 - 7.7 K/uL   Lymphocytes Relative 10 %   Lymphs Abs 0.7 0.7 - 4.0 K/uL   Monocytes Relative 3 %   Monocytes Absolute 0.2 0.1 - 1.0 K/uL   Eosinophils Relative 0 %   Eosinophils Absolute 0.0 0.0 - 0.7 K/uL   Basophils Relative 0 %   Basophils Absolute 0.0 0.0 - 0.1 K/uL  Comprehensive metabolic panel  Result Value Ref Range   Sodium 140 135 - 145 mmol/L   Potassium 4.8 3.5 - 5.1 mmol/L   Chloride 109 101 - 111 mmol/L   CO2 27 22 - 32 mmol/L   Glucose, Bld 116 (H) 65 - 99 mg/dL   BUN 23 (H) 6 - 20 mg/dL   Creatinine, Ser 1.10 0.61 - 1.24 mg/dL   Calcium 9.2 8.9 - 10.3 mg/dL   Total Protein 6.0 (L) 6.5 - 8.1 g/dL   Albumin 3.9 3.5 - 5.0 g/dL   AST 19 15 - 41 U/L   ALT 13 (L) 17 - 63 U/L   Alkaline Phosphatase 51 38 - 126 U/L   Total Bilirubin 1.5 (H) 0.3 - 1.2 mg/dL   GFR calc non Af Amer >60 >60 mL/min   GFR calc Af Amer >60 >60 mL/min   Anion gap 4 (L) 5 - 15  I-stat troponin, ED  Result Value Ref Range   Troponin i, poc 0.00 0.00 - 0.08 ng/mL   Comment 3          CBG monitoring, ED  Result Value Ref Range   Glucose-Capillary 106 (H) 65 -  99 mg/dL  I-Stat Chem 8, ED  Result Value Ref Range   Sodium 140 135 - 145 mmol/L   Potassium 4.8 3.5 - 5.1 mmol/L   Chloride 106 101 - 111 mmol/L   BUN 27 (H) 6 - 20 mg/dL   Creatinine, Ser 1.00 0.61 - 1.24 mg/dL   Glucose, Bld 112 (H) 65 - 99 mg/dL   Calcium, Ion 1.24 1.15 - 1.40 mmol/L   TCO2 26 22 - 32 mmol/L   Hemoglobin 11.9 (L) 13.0 - 17.0 g/dL   HCT 35.0 (L) 39.0 - 52.0 %   Ct Head Wo  Contrast  Result Date: 01/07/2017 CLINICAL DATA:  Vomiting and dizziness. EXAM: CT HEAD WITHOUT CONTRAST TECHNIQUE: Contiguous axial images were obtained from the base of the skull through the vertex without intravenous contrast. COMPARISON:  None. FINDINGS: Brain: No evidence of acute infarction, hemorrhage, hydrocephalus, extra-axial collection or mass lesion/mass effect. Vascular: No hyperdense vessel or unexpected calcification. Skull: Normal. Negative for fracture or focal lesion. Sinuses/Orbits: No acute finding. Other: None. IMPRESSION: No acute intracranial abnormality. Electronically Signed   By: Titus Dubin M.D.   On: 01/07/2017 13:55    Initial Impression / Assessment and Plan / ED Course  I have reviewed the triage vital signs and the nursing notes.  Pertinent labs & imaging results that were available during my care of the patient were reviewed by me and considered in my medical decision making (see chart for details).     Patient is signed out to Dr. Lita Mains [phone] PM.  Ativan ordered as premedication for MRI scan as patient gets claustrophobic  Final Clinical Impressions(s) / ED Diagnoses  Dx Vertigo Final diagnoses:  None    ED Discharge Orders    None       Orlie Dakin, MD 01/07/17 1652

## 2017-01-07 NOTE — ED Notes (Signed)
Patient transported to MRI 

## 2017-01-07 NOTE — ED Notes (Signed)
Pt back from MRI -- requesting food so he can take his medication with it.

## 2017-01-07 NOTE — Patient Instructions (Signed)
Medication Instructions:  HOLD METOPROLOL TODAY  Labwork: DIGOXIN LEVEL/CBC/CMET TODAY   Testing/Procedures: NONE  Follow-Up: Your physician wants you to follow-up in: 1 [place] will receive a reminder letter in the mail two months in advance. If you don't receive a letter, please call our office to schedule the follow-up appointment.  Any Other Special Instructions Will Be Listed Below (If Applicable). YOU ARE LOW RISK FOR SURGERY  OK TO HOLD YOUR XARELTO 3 DAYS PRIOR  If you need a refill on your cardiac medications before your next appointment, please call your pharmacy.

## 2017-01-07 NOTE — ED Provider Notes (Signed)
MRI negative for acute process.  Likely experiencing peripheral vertigo.  Has been evaluated by ENT.  Will refer to neurology.  Patient states his symptoms have improved and is ambulating without difficulty.  Return precautions given.   Julianne Rice, MD 01/07/17 2055

## 2017-01-07 NOTE — ED Notes (Signed)
Called by Dr Berle Mull office who states they have r/o that dizziness is caused by any ear irregularities.

## 2017-01-07 NOTE — ED Notes (Signed)
Pt is resting and appears comfortable.  Family is at bedside.  He reports dizziness is better.  Reason for delay explained to pt and family.

## 2017-01-07 NOTE — ED Notes (Signed)
Pt is still in MRI.

## 2017-01-08 ENCOUNTER — Telehealth: Payer: Self-pay | Admitting: Cardiovascular Disease

## 2017-01-08 NOTE — Telephone Encounter (Signed)
SAME MESSAGE

## 2017-01-08 NOTE — Telephone Encounter (Signed)
Left message for patient, clearance has been sent

## 2017-01-08 NOTE — Telephone Encounter (Signed)
Pt called and wanted to thank  [facility] and Dr Oval Linsey. THANK YOU TO YOU BOTH!!   He also wants to confirm his surgical clearance  Hip replacement was done and again thanks.

## 2017-01-09 NOTE — Telephone Encounter (Signed)
Rip Harbour, LPN has sent clearance and confirmed it was received with [facility]

## 2017-01-09 NOTE — Telephone Encounter (Signed)
Spoke with patient and clearance had not been received. Sent Dr Blenda Mounts office note to [place] and confirmed received  Left VM with patient

## 2017-01-09 NOTE — Telephone Encounter (Signed)
New Message     Needs surgical clearance resent to Dr Tamera Reason ,  They said they did not received it , they need it today.  If you can not get it faxed to them call the patient he will come pick it up and drop it off himself.

## 2017-01-12 ENCOUNTER — Ambulatory Visit: Payer: Self-pay | Admitting: Orthopedic Surgery

## 2017-01-21 ENCOUNTER — Ambulatory Visit: Payer: Self-pay | Admitting: Orthopedic Surgery

## 2017-01-21 NOTE — H&P (Signed)
TOTAL HIP ADMISSION H&P  Patient is admitted for right total hip arthroplasty.  Subjective:  Chief Complaint: right hip pain  HPI: Raymond Ray, 66 y.o. male, has a history of pain and functional disability in the right hip(s) due to arthritis and patient has failed non-surgical conservative treatments for greater than 12 weeks to include NSAID's and/or analgesics, corticosteriod injections, flexibility and strengthening excercises, use of assistive devices and activity modification.  Onset of symptoms was gradual starting 2 years ago with rapidlly worsening course since that time.The patient noted no past surgery on the right hip(s).  Patient currently rates pain in the right hip at 10 out of 10 with activity. Patient has night pain, worsening of pain with activity and weight bearing, pain that interfers with activities of daily living, pain with passive range of motion and crepitus. Patient has evidence of subchondral sclerosis, periarticular osteophytes and joint space narrowing by imaging studies. This condition presents safety issues increasing the risk of falls. There is no current active infection.  Patient Active Problem List   Diagnosis Date Noted  . Vertigo 01/07/2017  . Erectile dysfunction 04/18/2015  . Metatarsalgia 05/14/2011  . 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 Mitral regurgitation 08/07/2010  . Osteoarthritis 08/07/2010   Past Medical History:  Diagnosis Date  . Allergy   . Chronic atrial fibrillation ([place])   . History of colonoscopy 04/2001   negative  . Mitral valve prolapse   . Osteoarthritis of hip   . Vertigo 01/07/2017    Past Surgical History:  Procedure Laterality Date  . COLONOSCOPY    . KNEE SURGERY     left at age 6 in [place]  . spinal injection     December 2013  . VASECTOMY      Current Outpatient Medications  Medication Sig Dispense Refill Last Dose  . acetaminophen (TYLENOL) 650 MG CR tablet Take 650 mg by mouth every 8 (eight)  hours as needed for pain.     Marland Kitchen apixaban (ELIQUIS) 5 MG TABS tablet Take 1 tablet (5 mg total) by mouth 2 (two) times daily. (Patient not taking: Reported on 01/07/2017) 60 tablet 1 Not Taking at 1900  . Ascorbic Acid (VITAMIN C PO) Take 1 tablet by mouth daily.    01/06/2017 at Unknown time  . Cyanocobalamin (B-12) 500 MCG TABS Take 500 mcg by mouth daily.     . digoxin ([place]) 0.25 MG tablet TAKE ONE-HALF TABLET BY  MOUTH ON MONDAY, WEDNESDAY, AND FRIDAY ONLY (Patient taking differently: Take 0.25 mg by mouth See admin instructions. TAKE ONE-HALF TABLET BY  MOUTH ON MONDAY, WEDNESDAY, AND FRIDAY ONLY) 20 tablet 4 Past Week at Unknown time  . fexofenadine (ALLEGRA) 180 MG tablet Take 90 mg by mouth daily as needed for allergies (for allergies or skin itching).    01/06/2017 at unk  . meclizine (ANTIVERT) 25 MG tablet Take 1 tablet (25 mg total) 3 (three) times daily as needed by mouth for dizziness. (Patient not taking: Reported on 01/21/2017) 30 tablet 0 Not Taking at Unknown time  . metoprolol (LOPRESSOR) 50 MG tablet Take 0.5 tablets (25 mg total) by mouth daily. (Patient taking differently: Take 25 mg by mouth daily. Verified tartrate) 45 tablet 3 01/06/2017 at 0700  . Multiple Vitamin (MULTIVITAMIN) tablet Take 1 tablet daily by mouth.   01/06/2017 at Unknown time  . oxyCODONE-acetaminophen (PERCOCET) 10-325 MG tablet Take 0.5-1 tablets by mouth 2 (two) times daily as needed for pain (depends on pain level  if takes 0.5-1 tablet).     . rivaroxaban (XARELTO) 20 MG TABS tablet Take 1 tablet (20 mg total) by mouth daily with supper. 90 tablet 1 01/06/2017 at 1900  . sildenafil (REVATIO) 20 MG tablet 2 TO 5 TABLETS BY MOUTH AS NEEDED FOR ED (Patient not taking: Reported on 01/07/2017) 50 tablet 5 Not Taking at Unknown time  . vitamin C (ASCORBIC ACID) 500 MG tablet Take 500 mg by mouth daily.     Marland Kitchen zolpidem (AMBIEN) 10 MG tablet Take 10 mg by mouth at bedtime.  30 tablet 0 Past Month at Unknown time    No current facility-administered medications for this visit.    No Known Allergies  Social History   Tobacco Use  . Smoking status: Never Smoker  . Smokeless tobacco: Never Used  Substance Use Topics  . Alcohol use: No    Family History  Problem Relation Age of Onset  . Arrhythmia Father        tachycardia  . Cancer Mother   . Heart attack Mother   . Breast cancer Sister   . Colon cancer Neg Hx   . Esophageal cancer Neg Hx   . Stomach cancer Neg Hx   . Rectal cancer Neg Hx      Review of Systems  Constitutional: Negative.   HENT: Negative.   Eyes: Negative.   Respiratory: Negative.   Cardiovascular: Negative.   Gastrointestinal: Negative.   Genitourinary: Negative.   Musculoskeletal: Positive for back pain and joint pain.  Skin: Negative.   Neurological: Negative.   Endo/Heme/Allergies: Negative.   Psychiatric/Behavioral: Negative.     Objective:  Physical Exam  Vitals reviewed. Constitutional: He is oriented to person, place, and time. He appears well-developed and well-nourished.  HENT:  Head: Normocephalic and atraumatic.  Eyes: Conjunctivae and EOM are normal. Pupils are equal, round, and reactive to light.  Neck: Normal range of motion. Neck supple.  Cardiovascular: Normal rate and intact distal pulses.  Respiratory: Effort normal. No respiratory distress.  GI: Soft. He exhibits no distension.  Genitourinary:  Genitourinary Comments: deferred  Musculoskeletal:       Right hip: He exhibits decreased range of motion, decreased strength, bony tenderness and crepitus.  Neurological: He is alert and oriented to person, place, and time. He has normal reflexes.  Skin: Skin is warm and dry.  Psychiatric: He has a normal mood and affect. His behavior is normal. Judgment and thought content normal.    Vital signs in last 24 hours: @VSRANGES @  Labs:   Estimated body mass index is 22.47 kg/m as calculated from the following:   Height as of 01/07/17: 6'  3" (1.905 m).   Weight as of 10/15/15: 81.6 kg (179 lb 12.8 oz).   Imaging Review Plain radiographs demonstrate severe degenerative joint disease of the right hip(s). The bone quality appears to be adequate for age and reported activity level.  Assessment/Plan:  End stage arthritis, right hip(s)  The patient history, physical examination, clinical judgement of the provider and imaging studies are consistent with end stage degenerative joint disease of the right hip(s) and total hip arthroplasty is deemed medically necessary. The treatment options including medical management, injection therapy, arthroscopy and arthroplasty were discussed at length. The risks and benefits of total hip arthroplasty were presented and reviewed. The risks due to aseptic loosening, infection, stiffness, dislocation/subluxation,  thromboembolic complications and other imponderables were discussed.  The patient acknowledged the explanation, agreed to proceed with the plan and consent was signed.  Patient is being admitted for inpatient treatment for surgery, pain control, PT, OT, prophylactic antibiotics, VTE prophylaxis, progressive ambulation and ADL's and discharge planning.The patient is planning to be discharged home with HEP

## 2017-01-21 NOTE — H&P (View-Only) (Signed)
TOTAL HIP ADMISSION H&P  Patient is admitted for right total hip arthroplasty.  Subjective:  Chief Complaint: right hip pain  HPI: Raymond Ray, 66 y.o. male, has a history of pain and functional disability in the right hip(s) due to arthritis and patient has failed non-surgical conservative treatments for greater than 12 weeks to include NSAID's and/or analgesics, corticosteriod injections, flexibility and strengthening excercises, use of assistive devices and activity modification.  Onset of symptoms was gradual starting 2 years ago with rapidlly worsening course since that time.The patient noted no past surgery on the right hip(s).  Patient currently rates pain in the right hip at 10 out of 10 with activity. Patient has night pain, worsening of pain with activity and weight bearing, pain that interfers with activities of daily living, pain with passive range of motion and crepitus. Patient has evidence of subchondral sclerosis, periarticular osteophytes and joint space narrowing by imaging studies. This condition presents safety issues increasing the risk of falls. There is no current active infection.  Patient Active Problem List   Diagnosis Date Noted  . Vertigo 01/07/2017  . Erectile dysfunction 04/18/2015  . Metatarsalgia 05/14/2011  . 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 Mitral regurgitation 08/07/2010  . Osteoarthritis 08/07/2010   Past Medical History:  Diagnosis Date  . Allergy   . Chronic atrial fibrillation ([place])   . History of colonoscopy 04/2001   negative  . Mitral valve prolapse   . Osteoarthritis of hip   . Vertigo 01/07/2017    Past Surgical History:  Procedure Laterality Date  . COLONOSCOPY    . KNEE SURGERY     left at age 42 in [place]  . spinal injection     December 2013  . VASECTOMY      Current Outpatient Medications  Medication Sig Dispense Refill Last Dose  . acetaminophen (TYLENOL) 650 MG CR tablet Take 650 mg by mouth every 8 (eight)  hours as needed for pain.     Marland Kitchen apixaban (ELIQUIS) 5 MG TABS tablet Take 1 tablet (5 mg total) by mouth 2 (two) times daily. (Patient not taking: Reported on 01/07/2017) 60 tablet 1 Not Taking at 1900  . Ascorbic Acid (VITAMIN C PO) Take 1 tablet by mouth daily.    01/06/2017 at Unknown time  . Cyanocobalamin (B-12) 500 MCG TABS Take 500 mcg by mouth daily.     . digoxin ([place]) 0.25 MG tablet TAKE ONE-HALF TABLET BY  MOUTH ON MONDAY, WEDNESDAY, AND FRIDAY ONLY (Patient taking differently: Take 0.25 mg by mouth See admin instructions. TAKE ONE-HALF TABLET BY  MOUTH ON MONDAY, WEDNESDAY, AND FRIDAY ONLY) 20 tablet 4 Past Week at Unknown time  . fexofenadine (ALLEGRA) 180 MG tablet Take 90 mg by mouth daily as needed for allergies (for allergies or skin itching).    01/06/2017 at unk  . meclizine (ANTIVERT) 25 MG tablet Take 1 tablet (25 mg total) 3 (three) times daily as needed by mouth for dizziness. (Patient not taking: Reported on 01/21/2017) 30 tablet 0 Not Taking at Unknown time  . metoprolol (LOPRESSOR) 50 MG tablet Take 0.5 tablets (25 mg total) by mouth daily. (Patient taking differently: Take 25 mg by mouth daily. Verified tartrate) 45 tablet 3 01/06/2017 at 0700  . Multiple Vitamin (MULTIVITAMIN) tablet Take 1 tablet daily by mouth.   01/06/2017 at Unknown time  . oxyCODONE-acetaminophen (PERCOCET) 10-325 MG tablet Take 0.5-1 tablets by mouth 2 (two) times daily as needed for pain (depends on pain level  if takes 0.5-1 tablet).     . rivaroxaban (XARELTO) 20 MG TABS tablet Take 1 tablet (20 mg total) by mouth daily with supper. 90 tablet 1 01/06/2017 at 1900  . sildenafil (REVATIO) 20 MG tablet 2 TO 5 TABLETS BY MOUTH AS NEEDED FOR ED (Patient not taking: Reported on 01/07/2017) 50 tablet 5 Not Taking at Unknown time  . vitamin C (ASCORBIC ACID) 500 MG tablet Take 500 mg by mouth daily.     Marland Kitchen zolpidem (AMBIEN) 10 MG tablet Take 10 mg by mouth at bedtime.  30 tablet 0 Past Month at Unknown time    No current facility-administered medications for this visit.    No Known Allergies  Social History   Tobacco Use  . Smoking status: Never Smoker  . Smokeless tobacco: Never Used  Substance Use Topics  . Alcohol use: No    Family History  Problem Relation Age of Onset  . Arrhythmia Father        tachycardia  . Cancer Mother   . Heart attack Mother   . Breast cancer Sister   . Colon cancer Neg Hx   . Esophageal cancer Neg Hx   . Stomach cancer Neg Hx   . Rectal cancer Neg Hx      Review of Systems  Constitutional: Negative.   HENT: Negative.   Eyes: Negative.   Respiratory: Negative.   Cardiovascular: Negative.   Gastrointestinal: Negative.   Genitourinary: Negative.   Musculoskeletal: Positive for back pain and joint pain.  Skin: Negative.   Neurological: Negative.   Endo/Heme/Allergies: Negative.   Psychiatric/Behavioral: Negative.     Objective:  Physical Exam  Vitals reviewed. Constitutional: He is oriented to person, place, and time. He appears well-developed and well-nourished.  HENT:  Head: Normocephalic and atraumatic.  Eyes: Conjunctivae and EOM are normal. Pupils are equal, round, and reactive to light.  Neck: Normal range of motion. Neck supple.  Cardiovascular: Normal rate and intact distal pulses.  Respiratory: Effort normal. No respiratory distress.  GI: Soft. He exhibits no distension.  Genitourinary:  Genitourinary Comments: deferred  Musculoskeletal:       Right hip: He exhibits decreased range of motion, decreased strength, bony tenderness and crepitus.  Neurological: He is alert and oriented to person, place, and time. He has normal reflexes.  Skin: Skin is warm and dry.  Psychiatric: He has a normal mood and affect. His behavior is normal. Judgment and thought content normal.    Vital signs in last 24 hours: @VSRANGES @  Labs:   Estimated body mass index is 22.47 kg/m as calculated from the following:   Height as of 01/07/17: 6'  3" (1.905 m).   Weight as of 10/15/15: 81.6 kg (179 lb 12.8 oz).   Imaging Review Plain radiographs demonstrate severe degenerative joint disease of the right hip(s). The bone quality appears to be adequate for age and reported activity level.  Assessment/Plan:  End stage arthritis, right hip(s)  The patient history, physical examination, clinical judgement of the provider and imaging studies are consistent with end stage degenerative joint disease of the right hip(s) and total hip arthroplasty is deemed medically necessary. The treatment options including medical management, injection therapy, arthroscopy and arthroplasty were discussed at length. The risks and benefits of total hip arthroplasty were presented and reviewed. The risks due to aseptic loosening, infection, stiffness, dislocation/subluxation,  thromboembolic complications and other imponderables were discussed.  The patient acknowledged the explanation, agreed to proceed with the plan and consent was signed.  Patient is being admitted for inpatient treatment for surgery, pain control, PT, OT, prophylactic antibiotics, VTE prophylaxis, progressive ambulation and ADL's and discharge planning.The patient is planning to be discharged home with HEP

## 2017-01-22 ENCOUNTER — Other Ambulatory Visit (HOSPITAL_COMMUNITY): Payer: Self-pay | Admitting: Emergency Medicine

## 2017-01-22 NOTE — Progress Notes (Signed)
PTT, PT-INR, CBC, CMP  01-07-17  Epic  EKG 01-07-17 Epic  LOV/CARDIOLOGY CLEARANCE DR TIFFANY [place] 01-07-17 Epic

## 2017-01-22 NOTE — Patient Instructions (Signed)
Raymond Ray  01/22/2017   Your procedure is scheduled on: 01-29-17  Report to [facility] Main  Entrance Take [facility]  elevators to 3rd floor to  [place] at 730AM.    Call this number if you have problems the morning of surgery [phone]   Remember: ONLY 1 PERSON MAY GO WITH YOU TO SHORT STAY TO GET  READY MORNING OF Raymond Ray.  Do not eat food or drink liquids :After Midnight.     Take these medicines the morning of surgery with A SIP OF WATER: METOPROLOL, TYLENOL IF NEEDED, PERCOCET IF NEEDED                                You may not have any metal on your body including hair pins and              piercings  Do not wear jewelry, make-up, lotions, powders or perfumes, deodorant                        Men may shave face and neck.   Do not bring valuables to the hospital. [place].  Contacts, dentures or bridgework may not be worn into surgery.  Leave suitcase in the car. After surgery it may be brought to your room.                 Please read over the following fact sheets you were given: _____________________________________________________________________             [facility] - Preparing for Surgery Before surgery, you can play an important role.  Because skin is not sterile, your skin needs to be as free of germs as possible.  You can reduce the number of germs on your skin by washing with CHG (chlorahexidine gluconate) soap before surgery.  CHG is an antiseptic cleaner which kills germs and bonds with the skin to continue killing germs even after washing. Please DO NOT use if you have an allergy to CHG or antibacterial soaps.  If your skin becomes reddened/irritated stop using the CHG and inform your nurse when you arrive at Short Stay. Do not shave (including legs and underarms) for at least 48 hours prior to the first CHG shower.  You may shave your face/neck. Please follow these  instructions carefully:  1.  Shower with CHG Soap the night before surgery and the  morning of Surgery.  2.  If you choose to wash your hair, wash your hair first as usual with your  normal  shampoo.  3.  After you shampoo, rinse your hair and body thoroughly to remove the  shampoo.                           4.  Use CHG as you would any other liquid soap.  You can apply chg directly  to the skin and wash                       Gently with a scrungie or clean washcloth.  5.  Apply the CHG Soap to your body ONLY FROM THE NECK DOWN.   Do not  use on face/ open                           Wound or open sores. Avoid contact with eyes, ears mouth and genitals (private parts).                       Wash face,  Genitals (private parts) with your normal soap.             6.  Wash thoroughly, paying special attention to the area where your surgery  will be performed.  7.  Thoroughly rinse your body with warm water from the neck down.  8.  DO NOT shower/wash with your normal soap after using and rinsing off  the CHG Soap.                9.  Pat yourself dry with a clean towel.            10.  Wear clean pajamas.            11.  Place clean sheets on your bed the night of your first shower and do not  sleep with pets. Day of Surgery : Do not apply any lotions/deodorants the morning of surgery.  Please wear clean clothes to the hospital/surgery center.  FAILURE TO FOLLOW THESE INSTRUCTIONS MAY RESULT IN THE CANCELLATION OF YOUR SURGERY PATIENT SIGNATURE_________________________________  NURSE SIGNATURE__________________________________  ________________________________________________________________________   Raymond Ray  An incentive spirometer is a tool that can help keep your lungs clear and active. This tool measures how well you are filling your lungs with each breath. Taking long deep breaths may help reverse or decrease the chance of developing breathing (pulmonary) problems (especially  infection) following:  A long period of time when you are unable to move or be active. BEFORE THE PROCEDURE   If the spirometer includes an indicator to show your best effort, your nurse or respiratory therapist will set it to a desired goal.  If possible, sit up straight or lean slightly forward. Try not to slouch.  Hold the incentive spirometer in an upright position. INSTRUCTIONS FOR USE  1. Sit on the edge of your bed if possible, or sit up as far as you can in bed or on a chair. 2. Hold the incentive spirometer in an upright position. 3. Breathe out normally. 4. Place the mouthpiece in your mouth and seal your lips tightly around it. 5. Breathe in slowly and as deeply as possible, raising the piston or the ball toward the top of the column. 6. Hold your breath for 3-5 seconds or for as long as possible. Allow the piston or ball to fall to the bottom of the column. 7. Remove the mouthpiece from your mouth and breathe out normally. 8. Rest for a few seconds and repeat Steps 1 through 7 at least 10 times every 1-2 hours when you are awake. Take your time and take a few normal breaths between deep breaths. 9. The spirometer may include an indicator to show your best effort. Use the indicator as a goal to work toward during each repetition. 10. After each set of 10 deep breaths, practice coughing to be sure your lungs are clear. If you have an incision (the cut made at the time of surgery), support your incision when coughing by placing a pillow or rolled up towels firmly against it. Once you are able to get out of bed, walk  around indoors and cough well. You may stop using the incentive spirometer when instructed by your caregiver.  RISKS AND COMPLICATIONS  Take your time so you do not get dizzy or light-headed.  If you are in pain, you may need to take or ask for pain medication before doing incentive spirometry. It is harder to take a deep breath if you are having pain. AFTER  USE  Rest and breathe slowly and easily.  It can be helpful to keep track of a log of your progress. Your caregiver can provide you with a simple table to help with this. If you are using the spirometer at home, follow these instructions: [place] IF:   You are having difficultly using the spirometer.  You have trouble using the spirometer as often as instructed.  Your pain medication is not giving enough relief while using the spirometer.  You develop fever of 100.5 F (38.1 C) or higher. SEEK IMMEDIATE MEDICAL CARE IF:   You cough up bloody sputum that had not been present before.  You develop fever of 102 F (38.9 C) or greater.  You develop worsening pain at or near the incision site. MAKE SURE YOU:   Understand these instructions.  Will watch your condition.  Will get help right away if you are not doing well or get worse. Document Released: 06/23/2006 Document Revised: 05/05/2011 Document Reviewed: 08/24/2006 ExitCare Patient Information 2014 Exit[employer], [place].   ________________________________________________________________________  WHAT IS A BLOOD TRANSFUSION? Blood Transfusion Information  A transfusion is the replacement of blood or some of its parts. Blood is made up of multiple cells which provide different functions.  Red blood cells carry oxygen and are used for blood loss replacement.  White blood cells fight against infection.  Platelets control bleeding.  Plasma helps clot blood.  Other blood products are available for specialized needs, such as hemophilia or other clotting disorders. BEFORE THE TRANSFUSION  Who gives blood for transfusions?   Healthy volunteers who are fully evaluated to make sure their blood is safe. This is blood bank blood. Transfusion therapy is the safest it has ever been in the practice of medicine. Before blood is taken from a donor, a complete history is taken to make sure that person has no history of diseases  nor engages in risky social behavior (examples are intravenous drug use or sexual activity with multiple partners). The donor's travel history is screened to minimize risk of transmitting infections, such as malaria. The donated blood is tested for signs of infectious diseases, such as HIV and hepatitis. The blood is then tested to be sure it is compatible with you in order to minimize the chance of a transfusion reaction. If you or a relative donates blood, this is often done in anticipation of surgery and is not appropriate for emergency situations. It takes many days to process the donated blood. RISKS AND COMPLICATIONS Although transfusion therapy is very safe and saves many lives, the main dangers of transfusion include:   Getting an infectious disease.  Developing a transfusion reaction. This is an allergic reaction to something in the blood you were given. Every precaution is taken to prevent this. The decision to have a blood transfusion has been considered carefully by your caregiver before blood is given. Blood is not given unless the benefits outweigh the risks. AFTER THE TRANSFUSION  Right after receiving a blood transfusion, you will usually feel much better and more energetic. This is especially true if your red blood cells have  gotten low (anemic). The transfusion raises the level of the red blood cells which carry oxygen, and this usually causes an energy increase.  The nurse administering the transfusion will monitor you carefully for complications. HOME CARE INSTRUCTIONS  No special instructions are needed after a transfusion. You may find your energy is better. Speak with your caregiver about any limitations on activity for underlying diseases you may have. SEEK MEDICAL CARE IF:   Your condition is not improving after your transfusion.  You develop redness or irritation at the intravenous (IV) site. SEEK IMMEDIATE MEDICAL CARE IF:  Any of the following symptoms occur over the  next 12 hours:  Shaking chills.  You have a temperature by mouth above 102 F (38.9 C), not controlled by medicine.  Chest, back, or muscle pain.  People around you feel you are not acting correctly or are confused.  Shortness of breath or difficulty breathing.  Dizziness and fainting.  You get a rash or develop hives.  You have a decrease in urine output.  Your urine turns a dark color or changes to pink, red, or brown. Any of the following symptoms occur over the next 10 days:  You have a temperature by mouth above 102 F (38.9 C), not controlled by medicine.  Shortness of breath.  Weakness after normal activity.  The white part of the eye turns yellow (jaundice).  You have a decrease in the amount of urine or are urinating less often.  Your urine turns a dark color or changes to pink, red, or brown. Document Released: 02/08/2000 Document Revised: 05/05/2011 Document Reviewed: 09/27/2007 [facility] Patient Information 2014 [place], [place].  _______________________________________________________________________

## 2017-01-23 ENCOUNTER — Encounter (HOSPITAL_COMMUNITY): Payer: Self-pay

## 2017-01-23 ENCOUNTER — Other Ambulatory Visit: Payer: Self-pay

## 2017-01-23 ENCOUNTER — Encounter (HOSPITAL_COMMUNITY)
Admission: RE | Admit: 2017-01-23 | Discharge: 2017-01-23 | Disposition: A | Discharge status: Home / Self Care | Payer: 59 | Source: Ambulatory Visit | Attending: Orthopedic Surgery | Admitting: Orthopedic Surgery

## 2017-01-23 ENCOUNTER — Telehealth: Payer: Self-pay | Admitting: Cardiovascular Disease

## 2017-01-23 DIAGNOSIS — M1611 Unilateral primary osteoarthritis, right hip: Secondary | ICD-10-CM | POA: Diagnosis not present

## 2017-01-23 DIAGNOSIS — Z01812 Encounter for preprocedural laboratory examination: Secondary | ICD-10-CM | POA: Diagnosis not present

## 2017-01-23 HISTORY — DX: Nausea with vomiting, unspecified: Z98.890

## 2017-01-23 HISTORY — DX: Other specified postprocedural states: R11.2

## 2017-01-23 LAB — COMPREHENSIVE METABOLIC PANEL
ALBUMIN: 3.9 g/dL (ref 3.5–5.0)
ALK PHOS: 56 U/L (ref 38–126)
ALT: 14 U/L — AB (ref 17–63)
AST: 21 U/L (ref 15–41)
Anion gap: 7 (ref 5–15)
BUN: 27 mg/dL — AB (ref 6–20)
CHLORIDE: 105 mmol/L (ref 101–111)
CO2: 27 mmol/L (ref 22–32)
CREATININE: 1.24 mg/dL (ref 0.61–1.24)
Calcium: 8.9 mg/dL (ref 8.9–10.3)
GFR calc non Af Amer: 59 mL/min — ABNORMAL LOW (ref >60)
GLUCOSE: 118 mg/dL — AB (ref 65–99)
Potassium: 4.5 mmol/L (ref 3.5–5.1)
SODIUM: 139 mmol/L (ref 135–145)
Total Bilirubin: 1 mg/dL (ref 0.3–1.2)
Total Protein: 6.3 g/dL — ABNORMAL LOW (ref 6.5–8.1)

## 2017-01-23 LAB — SURGICAL PCR SCREEN
MRSA, PCR: NEGATIVE
STAPHYLOCOCCUS AUREUS: NEGATIVE

## 2017-01-23 MED ORDER — APIXABAN 5 MG PO TABS: 5.0000 mg | ORAL_TABLET | Freq: Two times a day (BID) | ORAL | 1 refills | Status: DC

## 2017-01-23 NOTE — Telephone Encounter (Signed)
Pt called to get new rx for Eliquis, he wants to change from Xarelto because of the cost, he has a coupon card for Eliquis. Uses [facility]

## 2017-01-23 NOTE — Telephone Encounter (Signed)
LMOM for patient - did not want to transition him between NOACs with just a phone message.   Tried second call - patient answered  He is stopping xarelto after Sunday for hip replacement surgery.  Would like to switch to Eliquis when he restarts day after surgery.  He thinks he should be able to use the copay card, as the Xarelto is becoming cost prohibitive.    Eliquis dose would be 5 mg bid (SCr 1.24, age 66, wt 175 lb)  Will send rx to [place]  Patient to call back on Monday to clarify which med he will use (if pricing same he will stay on Xarelto)

## 2017-01-24 LAB — ABO/RH: ABO/RH(D): O POS

## 2017-01-26 NOTE — Telephone Encounter (Signed)
Patient able to get Eliquis for $10/month.  Has stopped Xarelto for replacement this week and will re-start with Eliquis post surgery.

## 2017-01-28 NOTE — Anesthesia Preprocedure Evaluation (Addendum)
Anesthesia Evaluation  Patient identified by MRN, date of birth, ID band Patient awake    Reviewed: Allergy & Precautions, Patient's Chart, lab work & pertinent test results  History of Anesthesia Complications (+) PONV and history of anesthetic complications  Airway Mallampati: II  TM Distance: >3 FB     Dental no notable dental hx. (+) Dental Advisory Given   Pulmonary neg pulmonary ROS,    Pulmonary exam normal        Cardiovascular + dysrhythmias Atrial Fibrillation  Rhythm:Irregular Rate:Normal     Neuro/Psych negative neurological ROS     GI/Hepatic negative GI ROS, Neg liver ROS,   Endo/Other  negative endocrine ROS  Renal/GU negative Renal ROS     Musculoskeletal  (+) Arthritis ,   Abdominal   Peds  Hematology negative hematology ROS (+)   Anesthesia Other Findings Day of surgery medications reviewed with the patient.  Reproductive/Obstetrics                           Anesthesia Physical Anesthesia Plan  ASA: II  Anesthesia Plan: MAC and Spinal   Post-op Pain Management:    Induction:   PONV Risk Score and Plan: 2 and Ondansetron and Propofol infusion  Airway Management Planned: Natural Airway and Simple Face Mask  Additional Equipment:   Intra-op Plan:   Post-operative Plan:   Informed Consent: I have reviewed the patients History and Physical, chart, labs and discussed the procedure including the risks, benefits and alternatives for the proposed anesthesia with the patient or authorized representative who has indicated his/her understanding and acceptance.   Dental advisory given  Plan Discussed with: CRNA and Anesthesiologist  Anesthesia Plan Comments:        Anesthesia Quick Evaluation

## 2017-01-29 ENCOUNTER — Inpatient Hospital Stay (HOSPITAL_COMMUNITY): Payer: 59

## 2017-01-29 ENCOUNTER — Inpatient Hospital Stay (HOSPITAL_COMMUNITY): Payer: 59 | Admitting: Anesthesiology

## 2017-01-29 ENCOUNTER — Encounter (HOSPITAL_COMMUNITY): Payer: Self-pay | Admitting: *Deleted

## 2017-01-29 ENCOUNTER — Other Ambulatory Visit: Payer: Self-pay

## 2017-01-29 ENCOUNTER — Inpatient Hospital Stay (HOSPITAL_COMMUNITY)
Admission: RE | Admit: 2017-01-29 | Discharge: 2017-01-30 | DRG: 470 | Disposition: A | Discharge status: Home / Self Care | Payer: 59 | Source: Ambulatory Visit | Attending: Orthopedic Surgery | Admitting: Orthopedic Surgery

## 2017-01-29 ENCOUNTER — Encounter (HOSPITAL_COMMUNITY)
Admission: RE | Disposition: A | Discharge status: Home / Self Care | Payer: Self-pay | Source: Ambulatory Visit | Attending: Orthopedic Surgery

## 2017-01-29 DIAGNOSIS — I482 Chronic atrial fibrillation: Secondary | ICD-10-CM | POA: Diagnosis present

## 2017-01-29 DIAGNOSIS — I351 Nonrheumatic aortic (valve) insufficiency: Secondary | ICD-10-CM | POA: Diagnosis not present

## 2017-01-29 DIAGNOSIS — Z09 Encounter for follow-up examination after completed treatment for conditions other than malignant neoplasm: Secondary | ICD-10-CM

## 2017-01-29 DIAGNOSIS — I341 Nonrheumatic mitral (valve) prolapse: Secondary | ICD-10-CM | POA: Diagnosis present

## 2017-01-29 DIAGNOSIS — N529 Male erectile dysfunction, unspecified: Secondary | ICD-10-CM | POA: Diagnosis not present

## 2017-01-29 DIAGNOSIS — Z7901 Long term (current) use of anticoagulants: Secondary | ICD-10-CM

## 2017-01-29 DIAGNOSIS — M1611 Unilateral primary osteoarthritis, right hip: Principal | ICD-10-CM | POA: Diagnosis present

## 2017-01-29 DIAGNOSIS — Z419 Encounter for procedure for purposes other than remedying health state, unspecified: Secondary | ICD-10-CM

## 2017-01-29 DIAGNOSIS — R42 Dizziness and giddiness: Secondary | ICD-10-CM | POA: Diagnosis not present

## 2017-01-29 HISTORY — PX: TOTAL HIP ARTHROPLASTY: SHX124

## 2017-01-29 LAB — TYPE AND SCREEN
ABO/RH(D): O POS
ANTIBODY SCREEN: NEGATIVE

## 2017-01-29 SURGERY — ARTHROPLASTY, HIP, TOTAL, ANTERIOR APPROACH
Anesthesia: Monitor Anesthesia Care | Site: Hip | Laterality: Right

## 2017-01-29 MED ORDER — DOCUSATE SODIUM 100 MG PO CAPS
100.0000 mg | ORAL_CAPSULE | Freq: Two times a day (BID) | ORAL | Status: DC
Start: 2017-01-29 — End: 2017-01-30
  Administered 2017-01-29 – 2017-01-30 (×2): 100 mg via ORAL
  Filled 2017-01-29 (×2): qty 1

## 2017-01-29 MED ORDER — KETOROLAC TROMETHAMINE 15 MG/ML IJ SOLN
7.5000 mg | Freq: Four times a day (QID) | INTRAMUSCULAR | Status: AC
  Administered 2017-01-29 – 2017-01-30 (×4): 7.5 mg via INTRAVENOUS
  Filled 2017-01-29 (×4): qty 1

## 2017-01-29 MED ORDER — ACETAMINOPHEN 10 MG/ML IV SOLN
1000.0000 mg | INTRAVENOUS | Status: AC
  Administered 2017-01-29: 1000 mg via INTRAVENOUS
  Filled 2017-01-29: qty 100

## 2017-01-29 MED ORDER — PROPOFOL 10 MG/ML IV BOLUS
INTRAVENOUS | Status: AC
  Filled 2017-01-29: qty 20

## 2017-01-29 MED ORDER — METOCLOPRAMIDE HCL 5 MG/ML IJ SOLN: 5.0000 mg | Freq: Three times a day (TID) | INTRAMUSCULAR | Status: DC | PRN

## 2017-01-29 MED ORDER — SODIUM CHLORIDE 0.9 % IJ SOLN
INTRAMUSCULAR | Status: AC
  Filled 2017-01-29: qty 50

## 2017-01-29 MED ORDER — PHENYLEPHRINE 40 MCG/ML (10ML) SYRINGE FOR IV PUSH (FOR BLOOD PRESSURE SUPPORT)
PREFILLED_SYRINGE | INTRAVENOUS | Status: DC | PRN
  Administered 2017-01-29 (×4): 80 ug via INTRAVENOUS

## 2017-01-29 MED ORDER — ONDANSETRON HCL 4 MG PO TABS: 4.0000 mg | ORAL_TABLET | Freq: Four times a day (QID) | ORAL | Status: DC | PRN

## 2017-01-29 MED ORDER — BUPIVACAINE IN DEXTROSE 0.75-8.25 % IT SOLN
INTRATHECAL | Status: DC | PRN
  Administered 2017-01-29: 2 mL via INTRATHECAL

## 2017-01-29 MED ORDER — CHLORHEXIDINE GLUCONATE 4 % EX LIQD: 60.0000 mL | Freq: Once | CUTANEOUS | Status: DC

## 2017-01-29 MED ORDER — FENTANYL CITRATE (PF) 100 MCG/2ML IJ SOLN
INTRAMUSCULAR | Status: DC | PRN
  Administered 2017-01-29: 50 ug via INTRAVENOUS

## 2017-01-29 MED ORDER — DEXTROSE 5 % IV SOLN
500.0000 mg | Freq: Four times a day (QID) | INTRAVENOUS | Status: DC | PRN
  Filled 2017-01-29: qty 5

## 2017-01-29 MED ORDER — SODIUM CHLORIDE 0.9 % IV SOLN: INTRAVENOUS | Status: DC

## 2017-01-29 MED ORDER — LIDOCAINE 2% (20 MG/ML) 5 ML SYRINGE
INTRAMUSCULAR | Status: DC | PRN
  Administered 2017-01-29: 100 mg via INTRAVENOUS

## 2017-01-29 MED ORDER — SENNA 8.6 MG PO TABS
2.0000 | ORAL_TABLET | Freq: Every day | ORAL | Status: DC
  Administered 2017-01-29: 17.2 mg via ORAL
  Filled 2017-01-29: qty 2

## 2017-01-29 MED ORDER — DEXAMETHASONE SODIUM PHOSPHATE 10 MG/ML IJ SOLN
INTRAMUSCULAR | Status: DC | PRN
  Administered 2017-01-29: 10 mg via INTRAVENOUS

## 2017-01-29 MED ORDER — PROPOFOL 500 MG/50ML IV EMUL
INTRAVENOUS | Status: DC | PRN
  Administered 2017-01-29: 40 ug/kg/min via INTRAVENOUS

## 2017-01-29 MED ORDER — KETOROLAC TROMETHAMINE 30 MG/ML IJ SOLN
INTRAMUSCULAR | Status: AC
  Filled 2017-01-29: qty 1

## 2017-01-29 MED ORDER — PHENYLEPHRINE 40 MCG/ML (10ML) SYRINGE FOR IV PUSH (FOR BLOOD PRESSURE SUPPORT)
PREFILLED_SYRINGE | INTRAVENOUS | Status: AC
  Filled 2017-01-29: qty 10

## 2017-01-29 MED ORDER — FENTANYL CITRATE (PF) 100 MCG/2ML IJ SOLN
INTRAMUSCULAR | Status: AC
  Filled 2017-01-29: qty 2

## 2017-01-29 MED ORDER — BUPIVACAINE-EPINEPHRINE 0.25% -1:200000 IJ SOLN
INTRAMUSCULAR | Status: DC | PRN
  Administered 2017-01-29: 30 mL

## 2017-01-29 MED ORDER — LACTATED RINGERS IV SOLN
INTRAVENOUS | Status: DC
  Administered 2017-01-29 (×2): via INTRAVENOUS

## 2017-01-29 MED ORDER — MENTHOL 3 MG MT LOZG: 1.0000 | LOZENGE | OROMUCOSAL | Status: DC | PRN

## 2017-01-29 MED ORDER — WATER FOR IRRIGATION, STERILE IR SOLN
Status: DC | PRN
  Administered 2017-01-29: 2000 mL via SURGICAL_CAVITY

## 2017-01-29 MED ORDER — ALUM & MAG HYDROXIDE-SIMETH 200-200-20 MG/5ML PO SUSP: 30.0000 mL | ORAL | Status: DC | PRN

## 2017-01-29 MED ORDER — ACETAMINOPHEN 325 MG PO TABS: 650.0000 mg | ORAL_TABLET | ORAL | Status: DC | PRN

## 2017-01-29 MED ORDER — PROMETHAZINE HCL 25 MG/ML IJ SOLN: 6.2500 mg | INTRAMUSCULAR | Status: DC | PRN

## 2017-01-29 MED ORDER — POVIDONE-IODINE 10 % EX SWAB
2.0000 "application " | Freq: Once | CUTANEOUS | Status: AC
  Administered 2017-01-29: 2 via TOPICAL

## 2017-01-29 MED ORDER — CEFAZOLIN SODIUM-DEXTROSE 2-4 GM/100ML-% IV SOLN
2.0000 g | Freq: Four times a day (QID) | INTRAVENOUS | Status: AC
  Administered 2017-01-29 (×2): 2 g via INTRAVENOUS
  Filled 2017-01-29 (×2): qty 100

## 2017-01-29 MED ORDER — TRANEXAMIC ACID 1000 MG/10ML IV SOLN
1000.0000 mg | Freq: Once | INTRAVENOUS | Status: AC
  Administered 2017-01-29: 1000 mg via INTRAVENOUS
  Filled 2017-01-29: qty 1100

## 2017-01-29 MED ORDER — DIPHENHYDRAMINE HCL 12.5 MG/5ML PO ELIX: 12.5000 mg | ORAL_SOLUTION | ORAL | Status: DC | PRN

## 2017-01-29 MED ORDER — EPHEDRINE SULFATE-NACL 50-0.9 MG/10ML-% IV SOSY
PREFILLED_SYRINGE | INTRAVENOUS | Status: DC | PRN
  Administered 2017-01-29 (×5): 5 mg via INTRAVENOUS

## 2017-01-29 MED ORDER — LORATADINE 10 MG PO TABS
10.0000 mg | ORAL_TABLET | Freq: Every day | ORAL | Status: DC
  Administered 2017-01-29 – 2017-01-30 (×2): 10 mg via ORAL
  Filled 2017-01-29 (×2): qty 1

## 2017-01-29 MED ORDER — DEXAMETHASONE SODIUM PHOSPHATE 10 MG/ML IJ SOLN
10.0000 mg | Freq: Once | INTRAMUSCULAR | Status: AC
  Administered 2017-01-30: 09:00:00 10 mg via INTRAVENOUS
  Filled 2017-01-29: qty 1

## 2017-01-29 MED ORDER — DIGOXIN 125 MCG PO TABS
0.1250 mg | ORAL_TABLET | ORAL | Status: DC
Start: 2017-01-30 — End: 2017-01-30
  Administered 2017-01-30: 0.125 mg via ORAL
  Filled 2017-01-29: qty 1

## 2017-01-29 MED ORDER — MIDAZOLAM HCL 5 MG/5ML IJ SOLN
INTRAMUSCULAR | Status: DC | PRN
  Administered 2017-01-29: 2 mg via INTRAVENOUS

## 2017-01-29 MED ORDER — ONDANSETRON HCL 4 MG/2ML IJ SOLN
INTRAMUSCULAR | Status: DC | PRN
  Administered 2017-01-29: 4 mg via INTRAVENOUS

## 2017-01-29 MED ORDER — SODIUM CHLORIDE 0.9 % IJ SOLN
INTRAMUSCULAR | Status: DC | PRN
  Administered 2017-01-29: 29 mL

## 2017-01-29 MED ORDER — APIXABAN 2.5 MG PO TABS
2.5000 mg | ORAL_TABLET | Freq: Two times a day (BID) | ORAL | Status: DC
  Administered 2017-01-30: 09:00:00 2.5 mg via ORAL
  Filled 2017-01-29: qty 1

## 2017-01-29 MED ORDER — HYDROMORPHONE HCL 1 MG/ML IJ SOLN: 0.5000 mg | INTRAMUSCULAR | Status: DC | PRN

## 2017-01-29 MED ORDER — SODIUM CHLORIDE 0.9 % IR SOLN
Status: DC | PRN
  Administered 2017-01-29: 1000 mL

## 2017-01-29 MED ORDER — ISOPROPYL ALCOHOL 70 % SOLN
Status: DC | PRN
  Administered 2017-01-29: 1 via TOPICAL

## 2017-01-29 MED ORDER — SODIUM CHLORIDE 0.9 % IV SOLN
INTRAVENOUS | Status: DC
  Administered 2017-01-29 – 2017-01-30 (×3): via INTRAVENOUS

## 2017-01-29 MED ORDER — PHENOL 1.4 % MT LIQD: 1.0000 | OROMUCOSAL | Status: DC | PRN

## 2017-01-29 MED ORDER — CEFAZOLIN SODIUM-DEXTROSE 2-4 GM/100ML-% IV SOLN
2.0000 g | INTRAVENOUS | Status: AC
  Administered 2017-01-29: 2 g via INTRAVENOUS
  Filled 2017-01-29: qty 100

## 2017-01-29 MED ORDER — SODIUM CHLORIDE 0.9 % IR SOLN
Status: DC | PRN
  Administered 2017-01-29: 4000 mL

## 2017-01-29 MED ORDER — OXYCODONE HCL 5 MG PO TABS: 15.0000 mg | ORAL_TABLET | ORAL | Status: DC | PRN

## 2017-01-29 MED ORDER — OXYCODONE HCL 5 MG PO TABS
10.0000 mg | ORAL_TABLET | ORAL | Status: DC | PRN
  Filled 2017-01-29: qty 2

## 2017-01-29 MED ORDER — MIDAZOLAM HCL 2 MG/2ML IJ SOLN
INTRAMUSCULAR | Status: AC
  Filled 2017-01-29: qty 2

## 2017-01-29 MED ORDER — METHOCARBAMOL 500 MG PO TABS
500.0000 mg | ORAL_TABLET | Freq: Four times a day (QID) | ORAL | Status: DC | PRN
Start: 2017-01-29 — End: 2017-01-30
  Administered 2017-01-29: 500 mg via ORAL
  Filled 2017-01-29: qty 1

## 2017-01-29 MED ORDER — ACETAMINOPHEN 650 MG RE SUPP: 650.0000 mg | RECTAL | Status: DC | PRN

## 2017-01-29 MED ORDER — METOPROLOL TARTRATE 25 MG PO TABS
25.0000 mg | ORAL_TABLET | Freq: Every day | ORAL | Status: DC
  Administered 2017-01-30: 09:00:00 25 mg via ORAL
  Filled 2017-01-29: qty 1

## 2017-01-29 MED ORDER — BUPIVACAINE-EPINEPHRINE (PF) 0.25% -1:200000 IJ SOLN
INTRAMUSCULAR | Status: AC
  Filled 2017-01-29: qty 30

## 2017-01-29 MED ORDER — TRANEXAMIC ACID 1000 MG/10ML IV SOLN
1000.0000 mg | INTRAVENOUS | Status: AC
  Administered 2017-01-29: 1000 mg via INTRAVENOUS
  Filled 2017-01-29: qty 1100

## 2017-01-29 MED ORDER — ONDANSETRON HCL 4 MG/2ML IJ SOLN: 4.0000 mg | Freq: Four times a day (QID) | INTRAMUSCULAR | Status: DC | PRN

## 2017-01-29 MED ORDER — POLYETHYLENE GLYCOL 3350 17 G PO PACK: 17.0000 g | PACK | Freq: Every day | ORAL | Status: DC | PRN

## 2017-01-29 MED ORDER — KETOROLAC TROMETHAMINE 30 MG/ML IJ SOLN
INTRAMUSCULAR | Status: DC | PRN
  Administered 2017-01-29: 30 mg via INTRAVENOUS

## 2017-01-29 MED ORDER — METOCLOPRAMIDE HCL 5 MG PO TABS: 5.0000 mg | ORAL_TABLET | Freq: Three times a day (TID) | ORAL | Status: DC | PRN

## 2017-01-29 MED ORDER — FENTANYL CITRATE (PF) 100 MCG/2ML IJ SOLN: 25.0000 ug | INTRAMUSCULAR | Status: DC | PRN

## 2017-01-29 MED ORDER — PROPOFOL 10 MG/ML IV BOLUS
INTRAVENOUS | Status: AC
  Filled 2017-01-29: qty 40

## 2017-01-29 MED ORDER — ZOLPIDEM TARTRATE 10 MG PO TABS
10.0000 mg | ORAL_TABLET | Freq: Every day | ORAL | Status: DC
  Administered 2017-01-29: 10 mg via ORAL
  Filled 2017-01-29: qty 1

## 2017-01-29 SURGICAL SUPPLY — 52 items
BAG ZIPLOCK 12X15 (MISCELLANEOUS) ×3 IMPLANT
CAPT HIP TOTAL 2 ×3 IMPLANT
CHLORAPREP W/TINT 26ML (MISCELLANEOUS) ×3 IMPLANT
CLOTH BEACON ORANGE TIMEOUT ST (SAFETY) ×3 IMPLANT
COVER PERINEAL POST (MISCELLANEOUS) ×3 IMPLANT
COVER SURGICAL LIGHT HANDLE (MISCELLANEOUS) ×3 IMPLANT
DECANTER SPIKE VIAL GLASS SM (MISCELLANEOUS) ×3 IMPLANT
DERMABOND ADVANCED (GAUZE/BANDAGES/DRESSINGS) ×2
DERMABOND ADVANCED .7 DNX12 (GAUZE/BANDAGES/DRESSINGS) ×1 IMPLANT
DRAPE SHEET LG 3/4 BI-LAMINATE (DRAPES) ×9 IMPLANT
DRAPE STERI IOBAN 125X83 (DRAPES) ×3 IMPLANT
DRAPE U-SHAPE 47X51 STRL (DRAPES) ×6 IMPLANT
DRESSING AQUACEL AG SP 3.5X10 (GAUZE/BANDAGES/DRESSINGS) ×1 IMPLANT
DRSG AQUACEL AG ADV 3.5X10 (GAUZE/BANDAGES/DRESSINGS) ×3 IMPLANT
DRSG AQUACEL AG SP 3.5X10 (GAUZE/BANDAGES/DRESSINGS) ×3
ELECT PENCIL ROCKER SW 15FT (MISCELLANEOUS) ×3 IMPLANT
ELECT REM PT RETURN 15FT ADLT (MISCELLANEOUS) ×3 IMPLANT
GAUZE SPONGE 4X4 12PLY STRL (GAUZE/BANDAGES/DRESSINGS) ×3 IMPLANT
GLOVE BIO SURGEON STRL SZ7 (GLOVE) ×3 IMPLANT
GLOVE BIO SURGEON STRL SZ8.5 (GLOVE) ×6 IMPLANT
GLOVE BIOGEL M STRL SZ7.5 (GLOVE) ×6 IMPLANT
GLOVE BIOGEL PI IND STRL 7.5 (GLOVE) ×5 IMPLANT
GLOVE BIOGEL PI IND STRL 8 (GLOVE) ×1 IMPLANT
GLOVE BIOGEL PI IND STRL 8.5 (GLOVE) ×1 IMPLANT
GLOVE BIOGEL PI INDICATOR 7.5 (GLOVE) ×10
GLOVE BIOGEL PI INDICATOR 8 (GLOVE) ×2
GLOVE BIOGEL PI INDICATOR 8.5 (GLOVE) ×2
GLOVE SURG SS PI 7.5 STRL IVOR (GLOVE) ×3 IMPLANT
GLOVE SURG SS PI 8.0 STRL IVOR (GLOVE) ×3 IMPLANT
GOWN SPEC L3 XXLG W/TWL (GOWN DISPOSABLE) ×6 IMPLANT
GOWN STRL REUS W/TWL 2XL LVL3 (GOWN DISPOSABLE) ×3 IMPLANT
GOWN STRL REUS W/TWL LRG LVL3 (GOWN DISPOSABLE) ×3 IMPLANT
GOWN STRL REUS W/TWL XL LVL3 (GOWN DISPOSABLE) ×3 IMPLANT
HANDPIECE INTERPULSE COAX TIP (DISPOSABLE) ×2
HOOD PEEL AWAY FLYTE STAYCOOL (MISCELLANEOUS) ×12 IMPLANT
MARKER SKIN DUAL TIP RULER LAB (MISCELLANEOUS) ×3 IMPLANT
NEEDLE SPNL 18GX3.5 QUINCKE PK (NEEDLE) ×3 IMPLANT
PACK ANTERIOR HIP CUSTOM (KITS) ×3 IMPLANT
SAW OSC TIP CART 19.5X105X1.3 (SAW) ×3 IMPLANT
SEALER BIPOLAR AQUA 6.0 (INSTRUMENTS) ×3 IMPLANT
SET HNDPC FAN SPRY TIP SCT (DISPOSABLE) ×1 IMPLANT
SUT ETHIBOND NAB CT1 #1 30IN (SUTURE) ×6 IMPLANT
SUT MNCRL AB 3-0 PS2 18 (SUTURE) ×3 IMPLANT
SUT MON AB 2-0 CT1 36 (SUTURE) ×6 IMPLANT
SUT STRATAFIX PDO 1 14 VIOLET (SUTURE) ×2
SUT STRATFX PDO 1 14 VIOLET (SUTURE) ×1
SUT VIC AB 2-0 CT1 27 (SUTURE) ×2
SUT VIC AB 2-0 CT1 TAPERPNT 27 (SUTURE) ×1 IMPLANT
SUTURE STRATFX PDO 1 14 VIOLET (SUTURE) ×1 IMPLANT
SYR 50ML LL SCALE MARK (SYRINGE) ×3 IMPLANT
TRAY FOLEY W/METER SILVER 16FR (SET/KITS/TRAYS/PACK) ×3 IMPLANT
YANKAUER SUCT BULB TIP 10FT TU (MISCELLANEOUS) ×3 IMPLANT

## 2017-01-29 NOTE — Evaluation (Signed)
Physical Therapy Evaluation Patient Details Name: Raymond Ray MRN: [id number] DOB: [birth year] Today's Date: 01/29/2017   History of Present Illness  Pt s/p R THR and with hx of MVP and a-fib  Clinical Impression  Pt s/p R THR and presents with decreased R LE strength/ROM and post op pain limiting functional mobility.  Pt should progress to dc home with family assist.    Follow Up Recommendations DC plan and follow up therapy as arranged by surgeon    Equipment Recommendations  Rolling walker with 5" wheels    Recommendations for Other Services OT consult     Precautions / Restrictions Precautions Precautions: Fall Restrictions Weight Bearing Restrictions: No Other Position/Activity Restrictions: WBAT      Mobility  Bed Mobility Overal bed mobility: Needs Assistance Bed Mobility: Supine to Sit     Supine to sit: Min assist     General bed mobility comments: cues for sequence and use of L LE to self assist  Transfers Overall transfer level: Needs assistance Equipment used: Rolling walker (2 wheeled) Transfers: Sit to/from Stand Sit to Stand: Min assist         General transfer comment: cues for LE management and use of UEs to self assist  Ambulation/Gait Ambulation/Gait assistance: Min assist Ambulation Distance (Feet): 65 Feet Assistive device: Rolling walker (2 wheeled) Gait Pattern/deviations: Step-to pattern;Step-through pattern;Decreased step length - right;Decreased step length - left;Shuffle;Trunk flexed Gait velocity: decr Gait velocity interpretation: Below normal speed for age/gender General Gait Details: cues for posture, position from RW and initial sequence  Stairs            Wheelchair Mobility    Modified Rankin (Stroke Patients Only)       Balance                                             Pertinent Vitals/Pain Pain Assessment: 0-10 Pain Score: 2  Pain Location: R hip Pain Descriptors / Indicators:  Aching;Sore Pain Intervention(s): Limited activity within patient's tolerance;Monitored during session;Premedicated before session;Ice applied    Home Living Family/patient expects to be discharged to:: Private residence Living Arrangements: Spouse/significant other Available Help at Discharge: Family Type of Home: House Home Access: Stairs to enter Entrance Stairs-Rails: None Entrance Stairs-Number of Steps: 4 Home Layout: Two level Home Equipment: Cane - single point      Prior Function Level of Independence: Independent;Independent with assistive device(s)               Hand Dominance        Extremity/Trunk Assessment   Upper Extremity Assessment Upper Extremity Assessment: Overall WFL for tasks assessed    Lower Extremity Assessment Lower Extremity Assessment: RLE deficits/detail    Cervical / Trunk Assessment Cervical / Trunk Assessment: Normal  Communication   Communication: No difficulties  Cognition Arousal/Alertness: Awake/alert Behavior During Therapy: WFL for tasks assessed/performed Overall Cognitive Status: Within Functional Limits for tasks assessed                                        General Comments      Exercises Total Joint Exercises Ankle Circles/Pumps: AROM;Both;15 reps;Supine   Assessment/Plan    PT Assessment Patient needs continued PT services  PT Problem List Decreased strength;Decreased range of motion;Decreased activity tolerance;Decreased mobility;Decreased  knowledge of use of DME;Pain       PT Treatment Interventions DME instruction;Gait training;Stair training;Functional mobility training;Therapeutic activities;Therapeutic exercise;Patient/family education    PT Goals (Current goals can be found in the Care Plan section)  Acute Rehab PT Goals Patient Stated Goal: Regain IND and walk without pain PT Goal Formulation: With patient Time For Goal Achievement: 01/31/17 Potential to Achieve Goals: Good     Frequency 7X/week   Barriers to discharge        Co-evaluation               AM-PAC PT "6 Clicks" Daily Activity  Outcome Measure Difficulty turning over in bed (including adjusting bedclothes, sheets and blankets)?: Unable Difficulty moving from lying on back to sitting on the side of the bed? : Unable Difficulty sitting down on and standing up from a chair with arms (e.g., wheelchair, bedside commode, etc,.)?: Unable Help needed moving to and from a bed to chair (including a wheelchair)?: A Little Help needed walking in hospital room?: A Little Help needed climbing 3-5 steps with a railing? : A Little 6 Click Score: 12    End of Session Equipment Utilized During Treatment: Gait belt Activity Tolerance: Patient tolerated treatment well Patient left: in chair;with call bell/phone within reach;with family/visitor present;with chair alarm set Nurse Communication: Mobility status PT Visit Diagnosis: Difficulty in walking, not elsewhere classified (R26.2)    Time: 4492-0100 PT Time Calculation (min) (ACUTE ONLY): 30 min   Charges:   PT Evaluation $PT Eval Low Complexity: 1 Low PT Treatments $Gait Training: 8-22 mins   PT G Codes:        Pg [phone]   [name] 01/29/2017, 6:02 PM

## 2017-01-29 NOTE — Anesthesia Procedure Notes (Addendum)
Spinal  Start time: 01/29/2017 9:41 AM End time: 01/29/2017 9:46 AM Staffing Resident/CRNA: Victoriano Lain, CRNA Preanesthetic Checklist Completed: patient identified, site marked, surgical consent, pre-op evaluation, timeout performed, IV checked, risks and benefits discussed and monitors and equipment checked Spinal Block Patient position: sitting Prep: ChloraPrep and site prepped and draped Patient monitoring: heart rate, continuous pulse ox and blood pressure Approach: midline Location: L3-4 Injection technique: single-shot Needle Needle type: Pencan  Needle gauge: 24 G Needle length: 9 cm Assessment Sensory level: T4 Additional Notes Spinal kit expiration date checked and verified. Pt placed in sitting position for spinal placement. One attempt. + CSF, - heme. Pt tolerated well.

## 2017-01-29 NOTE — Transfer of Care (Signed)
Immediate Anesthesia Transfer of Care Note  Patient: Raymond Ray  Procedure(s) Performed: RIGHT TOTAL HIP ARTHROPLASTY ANTERIOR APPROACH (Right Hip)  Patient Location: PACU  Anesthesia Type:MAC and Spinal  Level of Consciousness: awake, alert , oriented and patient cooperative  Airway & Oxygen Therapy: Patient Spontanous Breathing and Patient connected to face mask oxygen  Post-op Assessment: Report given to RN and Post -op Vital signs reviewed and stable  Post vital signs: Reviewed and stable  Last Vitals:  Vitals:   01/29/17 0755  BP: 108/62  Pulse: 90  Resp: 18  Temp: 36.4 C  SpO2: 100%    Last Pain:  Vitals:   01/29/17 0755  TempSrc: Oral      Patients Stated Pain Goal: 3 (53/29/92 4268)  Complications: No apparent anesthesia complications

## 2017-01-29 NOTE — Op Note (Signed)
OPERATIVE REPORT  SURGEON: Rod Can, MD   ASSISTANT: Nehemiah Massed, PA-C.  PREOPERATIVE DIAGNOSIS: Right hip arthritis.   POSTOPERATIVE DIAGNOSIS: Right hip arthritis.   PROCEDURE: Right total hip arthroplasty, anterior approach.   IMPLANTS: DePuy Tri Lock stem, size 8, hi offset. DePuy Pinnacle Cup, size 56 mm. DePuy Altrx liner, size 36 by 56 mm, neutral. DePuy Biolox ceramic head ball, size 36 + 5 mm.  ANESTHESIA:  Spinal  ESTIMATED BLOOD LOSS:-350 mL    ANTIBIOTICS: 2 g Ancef.  DRAINS: None.  COMPLICATIONS: None.   CONDITION: PACU - hemodynamically stable.   BRIEF CLINICAL NOTE: Raymond Ray is a 66 y.o. male with a long-standing history of Right hip arthritis. After failing conservative management, the patient was indicated for total hip arthroplasty. The risks, benefits, and alternatives to the procedure were explained, and the patient elected to proceed.  PROCEDURE IN DETAIL: Surgical site was marked by myself in the pre-op holding area. Once inside the operating room, spinal anesthesia was obtained, and a foley catheter was inserted. The patient was then positioned on the Hana table. All bony prominences were well padded. The hip was prepped and draped in the normal sterile surgical fashion. A time-out was called verifying side and site of surgery. The patient received IV antibiotics within 60 minutes of beginning the procedure.  The direct anterior approach to the hip was performed through the Hueter interval. Lateral femoral circumflex vessels were treated with the Auqumantys. The anterior capsule was exposed and an inverted T capsulotomy was made.The femoral neck cut was made to the level of the templated cut. A corkscrew was placed into the head and the head was removed. The femoral head was found to have eburnated bone. The head was passed to the back table and was measured.  Acetabular exposure was achieved, and the pulvinar and labrum were  excised. Sequential reaming of the acetabulum was then performed up to a size 55 mm reamer. A 56 mm cup was then opened and impacted into place at approximately 40 degrees of abduction and 20 degrees of anteversion. The final polyethylene liner was impacted into place and acetabular osteophytes were removed.   I then gained femoral exposure taking care to protect the abductors and greater trochanter. This was performed using standard external rotation, extension, and adduction. The capsule was peeled off the inner aspect of the greater trochanter, taking care to preserve the short external rotators. A cookie cutter was used to enter the femoral canal, and then the femoral canal finder was placed. Sequential broaching was performed up to a size 8. Calcar planer was used on the femoral neck remnant. I placed a hi offset neck and a trial head ball. The hip was reduced. Leg lengths and offset were checked fluoroscopically. The hip was dislocated and trial components were removed. The final implants were placed, and the hip was reduced.  Fluoroscopy was used to confirm component position and leg lengths. At 90 degrees of external rotation and full extension, the hip was stable to an anterior directed force.  The wound was copiously irrigated with normal saline using pulse lavage. Marcaine solution was injected into the periarticular soft tissue. The wound was closed in layers using #1 Vicryl and V-Loc for the fascia, 2-0 Vicryl for the subcutaneous fat, 2-0 Monocryl for the deep dermal layer, 3-0 running Monocryl subcuticular stitch, and Dermabond for the skin. Once the glue was fully dried, an Aquacell Ag dressing was applied. The patient was transported to the recovery room in  stable condition. Sponge, needle, and instrument counts were correct at the end of the case x2. The patient tolerated the procedure well and there were no known complications.  Please note that a surgical assistant was a  medical necessity for this procedure to perform it in a safe and expeditious manner. Assistant was necessary to provide appropriate retraction of vital neurovascular structures, to prevent femoral fracture, and to allow for anatomic placement of the prosthesis.

## 2017-01-29 NOTE — Anesthesia Postprocedure Evaluation (Signed)
Anesthesia Post Note  Patient: Raymond Ray  Procedure(s) Performed: RIGHT TOTAL HIP ARTHROPLASTY ANTERIOR APPROACH (Right Hip)     Patient location during evaluation: PACU Anesthesia Type: MAC Level of consciousness: awake and alert Pain management: pain level controlled Vital Signs Assessment: post-procedure vital signs reviewed and stable Respiratory status: spontaneous breathing and respiratory function stable Cardiovascular status: blood pressure returned to baseline and stable Postop Assessment: spinal receding Anesthetic complications: no    Last Vitals:  Vitals:   01/29/17 1341 01/29/17 1345  BP: 103/68 103/68  Pulse: 87 87  Resp: 16 16  Temp: (!) 36.3 C (!) 36.3 C  SpO2: 100% 100%    Last Pain:  Vitals:   01/29/17 1345  TempSrc: Oral  PainSc: 0-No pain                 Raymond Ray

## 2017-01-29 NOTE — Discharge Instructions (Signed)
°Dr. Lamoyne Hessel °Joint Replacement Specialist °[facility] °[address] °[place], [place] [address] °[phone] ° ° °TOTAL HIP REPLACEMENT POSTOPERATIVE DIRECTIONS ° ° ° °Hip Rehabilitation, Guidelines Following Surgery  ° °WEIGHT BEARING °Weight bearing as tolerated with assist device (walker, cane, etc) as directed, use it as long as suggested by your surgeon or therapist, typically at least 4-6 weeks. ° °The results of a hip operation are greatly improved after range of motion and muscle strengthening exercises. Follow all safety measures which are given to protect your hip. If any of these exercises cause increased pain or swelling in your joint, decrease the amount until you are comfortable again. Then slowly increase the exercises. Call your caregiver if you have problems or questions.  ° °HOME CARE INSTRUCTIONS  °Most of the following instructions are designed to prevent the dislocation of your new hip.  °Remove items at home which could result in a fall. This includes throw rugs or furniture in walking pathways.  °Continue medications as instructed at time of discharge. °· You may have some home medications which will be placed on hold until you complete the course of blood thinner medication. °· You may start showering once you are discharged home. Do not remove your dressing. °Do not put on socks or shoes without following the instructions of your caregivers.   °Sit on chairs with arms. Use the chair arms to help push yourself up when arising.  °Arrange for the use of a toilet seat elevator so you are not sitting low.  °· Walk with walker as instructed.  °You may resume a sexual relationship in one month or when given the OK by your caregiver.  °Use walker as long as suggested by your caregivers.  °You may put full weight on your legs and walk as much as is comfortable. °Avoid periods of inactivity such as sitting longer than an hour when not asleep. This helps prevent  blood clots.  °You may return to work once you are cleared by your surgeon.  °Do not drive a car for 6 weeks or until released by your surgeon.  °Do not drive while taking narcotics.  °Wear elastic stockings for two weeks following surgery during the day but you may remove then at night.  °Make sure you keep all of your appointments after your operation with all of your doctors and caregivers. You should call the office at the above phone number and make an appointment for approximately two weeks after the date of your surgery. °Please pick up a stool softener and laxative for home use as long as you are requiring pain medications. °· ICE to the affected hip every three hours for 30 minutes at a time and then as needed for pain and swelling. Continue to use ice on the hip for pain and swelling from surgery. You may notice swelling that will progress down to the foot and ankle.  This is normal after surgery.  Elevate the leg when you are not up walking on it.   °It is important for you to complete the blood thinner medication as prescribed by your doctor. °· Continue to use the breathing machine which will help keep your temperature down.  It is common for your temperature to cycle up and down following surgery, especially at night when you are not up moving around and exerting yourself.  The breathing machine keeps your lungs expanded and your temperature down. ° °RANGE OF MOTION AND STRENGTHENING EXERCISES  °These exercises are   designed to help you keep full movement of your hip joint. Follow your caregiver's or physical therapist's instructions. Perform all exercises about fifteen times, three times per day or as directed. Exercise both hips, even if you have had only one joint replacement. These exercises can be done on a training (exercise) mat, on the floor, on a table or on a bed. Use whatever works the best and is most comfortable for you. Use music or television while you are exercising so that the exercises  are a pleasant break in your day. This will make your life better with the exercises acting as a break in routine you can look forward to.  °Lying on your back, slowly slide your foot toward your buttocks, raising your knee up off the floor. Then slowly slide your foot back down until your leg is straight again.  °Lying on your back spread your legs as far apart as you can without causing discomfort.  °Lying on your side, raise your upper leg and foot straight up from the floor as far as is comfortable. Slowly lower the leg and repeat.  °Lying on your back, tighten up the muscle in the front of your thigh (quadriceps muscles). You can do this by keeping your leg straight and trying to raise your heel off the floor. This helps strengthen the largest muscle supporting your knee.  °Lying on your back, tighten up the muscles of your buttocks both with the legs straight and with the knee bent at a comfortable angle while keeping your heel on the floor.  ° °SKILLED REHAB INSTRUCTIONS: °If the patient is transferred to a skilled rehab facility following release from the hospital, a list of the current medications will be sent to the facility for the patient to continue.  When discharged from the skilled rehab facility, please have the facility set up the patient's Home Health Physical Therapy prior to being released. Also, the skilled facility will be responsible for providing the patient with their medications at time of release from the facility to include their pain medication and their blood thinner medication. If the patient is still at the rehab facility at time of the two week follow up appointment, the skilled rehab facility will also need to assist the patient in arranging follow up appointment in our office and any transportation needs. ° °MAKE SURE YOU:  °Understand these instructions.  °Will watch your condition.  °Will get help right away if you are not doing well or get worse. ° °Pick up stool softner and  laxative for home use following surgery while on pain medications. °Do not remove your dressing. °The dressing is waterproof--it is OK to take showers. °Continue to use ice for pain and swelling after surgery. °Do not use any lotions or creams on the incision until instructed by your surgeon. °Total Hip Protocol. ° ° °

## 2017-01-29 NOTE — Plan of Care (Signed)
df

## 2017-01-29 NOTE — Interval H&P Note (Signed)
History and Physical Interval Note:  01/29/2017 9:35 AM  Raymond Ray  has presented today for surgery, with the diagnosis of Degenerative joint disease right hip  The various methods of treatment have been discussed with the patient and family. After consideration of risks, benefits and other options for treatment, the patient has consented to  Procedure(s) with comments: RIGHT TOTAL HIP ARTHROPLASTY ANTERIOR APPROACH (Right) - Needs RNFA as a surgical intervention .  The patient's history has been reviewed, patient examined, no change in status, stable for surgery.  I have reviewed the patient's chart and labs.  Questions were answered to the patient's satisfaction.     Hilton Cork Natika Geyer

## 2017-01-29 NOTE — Progress Notes (Signed)
X-ray results noted 

## 2017-01-29 NOTE — Progress Notes (Signed)
Portable AP Pelvis X-ray done. 

## 2017-01-30 LAB — BASIC METABOLIC PANEL
ANION GAP: 6 (ref 5–15)
BUN: 23 mg/dL — ABNORMAL HIGH (ref 6–20)
CALCIUM: 8.2 mg/dL — AB (ref 8.9–10.3)
CHLORIDE: 111 mmol/L (ref 101–111)
CO2: 22 mmol/L (ref 22–32)
CREATININE: 1 mg/dL (ref 0.61–1.24)
GFR calc non Af Amer: >60 mL/min (ref >60)
Glucose, Bld: 236 mg/dL — ABNORMAL HIGH (ref 65–99)
Potassium: 4.2 mmol/L (ref 3.5–5.1)
SODIUM: 139 mmol/L (ref 135–145)

## 2017-01-30 LAB — CBC
HEMATOCRIT: 27 % — AB (ref 39.0–52.0)
HEMOGLOBIN: 9.2 g/dL — AB (ref 13.0–17.0)
MCH: 34.3 pg — ABNORMAL HIGH (ref 26.0–34.0)
MCHC: 34.1 g/dL (ref 30.0–36.0)
MCV: 100.7 fL — ABNORMAL HIGH (ref 78.0–100.0)
Platelets: 232 10*3/uL (ref 150–400)
RBC: 2.68 MIL/uL — AB (ref 4.22–5.81)
RDW: 14 % (ref 11.5–15.5)
WBC: 12.1 10*3/uL — AB (ref 4.0–10.5)

## 2017-01-30 MED ORDER — DOCUSATE SODIUM 100 MG PO CAPS: 100.0000 mg | ORAL_CAPSULE | Freq: Two times a day (BID) | ORAL | 1 refills | Status: DC

## 2017-01-30 MED ORDER — SENNA 8.6 MG PO TABS: 2.0000 | ORAL_TABLET | Freq: Every day | ORAL | 0 refills | Status: DC

## 2017-01-30 MED ORDER — OXYCODONE HCL 5 MG PO TABS: 10.0000 mg | ORAL_TABLET | ORAL | 0 refills | Status: DC | PRN

## 2017-01-30 MED ORDER — ONDANSETRON HCL 4 MG PO TABS: 4.0000 mg | ORAL_TABLET | Freq: Four times a day (QID) | ORAL | 0 refills | Status: DC | PRN

## 2017-01-30 NOTE — Progress Notes (Signed)
Discharge planning, no HH needs identified. Plan for HEP. Needs RW and 3n1, contacted [facility] to deliver to room. [phone]

## 2017-01-30 NOTE — Discharge Summary (Signed)
Physician Discharge Summary  Patient ID: Raymond Ray MRN: [id number] DOB/AGE: 05-14-1950 66 y.o.  Admit date: 01/29/2017 Discharge date: 01/30/2017  Admission Diagnoses:  Osteoarthritis of right hip  Discharge Diagnoses:  Principal Problem:   Osteoarthritis of right hip   Past Medical History:  Diagnosis Date  . Allergy   . Chronic atrial fibrillation ([place])   . History of colonoscopy 04/2001   negative  . Mitral valve prolapse   . Osteoarthritis of hip   . PONV (postoperative nausea and vomiting)    left knee cartilage removal;    . Vertigo 01/07/2017   currently not symptomatic     Surgeries: Procedure(s): RIGHT TOTAL HIP ARTHROPLASTY ANTERIOR APPROACH on 01/29/2017   Consultants (if any):   Discharged Condition: Improved  Hospital Course: Raymond Ray is an 66 y.o. male who was admitted 01/29/2017 with a diagnosis of Osteoarthritis of right hip and went to the operating room on 01/29/2017 and underwent the above named procedures.    He was given perioperative antibiotics:  Anti-infectives (From admission, onward)   Start     Dose/Rate Route Frequency Ordered Stop   01/29/17 1400  ceFAZolin (ANCEF) IVPB 2g/100 mL premix     2 g 200 mL/hr over 30 Minutes Intravenous Every 6 hours 01/29/17 1349 01/30/17 0243   01/29/17 0743  ceFAZolin (ANCEF) IVPB 2g/100 mL premix     2 g 200 mL/hr over 30 Minutes Intravenous On call to O.R. 01/29/17 9628 01/29/17 1007    .  He was given sequential compression devices, early ambulation, and eliquis for DVT prophylaxis.  He benefited maximally from the hospital stay and there were no complications.    Recent vital signs:  Vitals:   01/30/17 0620 01/30/17 0852  BP: (!) 116/59 122/66  Pulse: 79 80  Resp: 17 18  Temp: 98.1 F (36.7 C) 98 F (36.7 C)  SpO2: 100% 100%    Recent laboratory studies:  Lab Results  Component Value Date   HGB 9.2 (L) 01/30/2017   HGB 11.9 (L) 01/07/2017   HGB 11.4 (L) 01/07/2017   Lab Results   Component Value Date   WBC 12.1 (H) 01/30/2017   PLT 232 01/30/2017   Lab Results  Component Value Date   INR 1.55 01/07/2017   Lab Results  Component Value Date   NA 139 01/30/2017   K 4.2 01/30/2017   CL 111 01/30/2017   CO2 22 01/30/2017   BUN 23 (H) 01/30/2017   CREATININE 1.00 01/30/2017   GLUCOSE 236 (H) 01/30/2017    Discharge Medications:   Allergies as of 01/30/2017   No Known Allergies     Medication List    STOP taking these medications   oxyCODONE-acetaminophen 10-325 MG tablet Commonly known as:  PERCOCET     TAKE these medications   acetaminophen 650 MG CR tablet Commonly known as:  TYLENOL Take 650 mg by mouth every 8 (eight) hours as needed for pain.   AMBIEN 10 MG tablet Generic drug:  zolpidem Take 10 mg by mouth at bedtime.   apixaban 5 MG Tabs tablet Commonly known as:  ELIQUIS Take 1 tablet (5 mg total) by mouth 2 (two) times daily.   B-12 500 MCG Tabs Take 500 mcg by mouth daily.   digoxin 0.25 MG tablet Commonly known as:  K[place] TABLET BY  MOUTH ON MONDAY, WEDNESDAY, AND FRIDAY ONLY What changed:    how much to take  how to take this  when to take this  additional instructions   docusate sodium 100 MG capsule Commonly known as:  COLACE Take 1 capsule (100 mg total) by mouth 2 (two) times daily.   fexofenadine 180 MG tablet Commonly known as:  ALLEGRA Take 90 mg by mouth daily as needed for allergies (for allergies or skin itching).   meclizine 25 MG tablet Commonly known as:  ANTIVERT Take 1 tablet (25 mg total) 3 (three) times daily as needed by mouth for dizziness.   metoprolol tartrate 50 MG tablet Commonly known as:  LOPRESSOR Take 0.5 tablets (25 mg total) by mouth daily. What changed:  additional instructions   multivitamin tablet Take 1 tablet daily by mouth.   ondansetron 4 MG tablet Commonly known as:  ZOFRAN Take 1 tablet (4 mg total) by mouth every 6 (six) hours as needed for nausea.    oxyCODONE 5 MG immediate release tablet Commonly known as:  Oxy IR/ROXICODONE Take 2 tablets (10 mg total) by mouth every 4 (four) hours as needed for moderate pain.   senna 8.6 MG Tabs tablet Commonly known as:  SENOKOT Take 2 tablets (17.2 mg total) by mouth at bedtime.   sildenafil 20 MG tablet Commonly known as:  REVATIO 2 TO 5 TABLETS BY MOUTH AS NEEDED FOR ED   vitamin C 500 MG tablet Commonly known as:  ASCORBIC ACID Take 500 mg by mouth daily.   VITAMIN C PO Take 1 tablet by mouth daily.            Durable Medical Equipment  (From admission, onward)        Start     Ordered   01/29/17 1350  DME Walker rolling  Once    Question:  Patient needs a walker to treat with the following condition  Answer:  Status post total replacement of right hip   01/29/17 1349   01/29/17 1350  DME 3 n 1  Once     01/29/17 1349      Diagnostic Studies: Ct Head Wo Contrast  Result Date: 01/07/2017 CLINICAL DATA:  Vomiting and dizziness. EXAM: CT HEAD WITHOUT CONTRAST TECHNIQUE: Contiguous axial images were obtained from the base of the skull through the vertex without intravenous contrast. COMPARISON:  None. FINDINGS: Brain: No evidence of acute infarction, hemorrhage, hydrocephalus, extra-axial collection or mass lesion/mass effect. Vascular: No hyperdense vessel or unexpected calcification. Skull: Normal. Negative for fracture or focal lesion. Sinuses/Orbits: No acute finding. Other: None. IMPRESSION: No acute intracranial abnormality. Electronically Signed   By: Titus Dubin M.D.   On: 01/07/2017 13:55   Mr Brain Wo Contrast  Result Date: 01/07/2017 CLINICAL DATA:  Central vertigo EXAM: MRI HEAD WITHOUT CONTRAST TECHNIQUE: Multiplanar, multiecho pulse sequences of the brain and surrounding structures were obtained without intravenous contrast. COMPARISON:  Head CT 01/07/2017 FINDINGS: Brain: The midline structures are normal. There is no acute infarct or acute hemorrhage. No  mass lesion, hydrocephalus, dural abnormality or extra-axial collection. The brain parenchymal signal is normal. No age-advanced or lobar predominant atrophy. No chronic microhemorrhage or superficial siderosis. Vascular: Major intracranial arterial and venous sinus flow voids are preserved. Skull and upper cervical spine: The visualized skull base, calvarium, upper cervical spine and extracranial soft tissues are normal. Sinuses/Orbits: No fluid levels or advanced mucosal thickening. No mastoid or middle ear effusion. Normal orbits. IMPRESSION: Normal brain MRI. Electronically Signed   By: Ulyses Jarred M.D.   On: 01/07/2017 20:01   Dg Pelvis Portable  Result Date: 01/29/2017 CLINICAL DATA:  As post right sided  anterior approach total hip replacement for osteoarthritis. EXAM: PORTABLE PELVIS 1-2 VIEWS COMPARISON:  Fluoro spot images of today's date FINDINGS: The patient has undergone right total hip joint prosthesis placement. Radiographic positioning of the prosthetic components is good. The interface with the native bone appears normal. IMPRESSION: There is no immediate postprocedure complication following anterior approach right total hip joint prosthesis placement. Electronically Signed   By: David  Martinique M.D.   On: 01/29/2017 12:26   Dg C-arm 61-120 Min-no Report  Result Date: 01/29/2017 CLINICAL DATA:  Right total hip replacement EXAM: DG C-ARM 61-120 MIN-NO REPORT COMPARISON:  None. FINDINGS: C-arm fluoroscopy was provided during right hip replacement. 2 C-arm spot films were obtained. Fluoroscopy time of 21 seconds was recorded with radiation exposure of 1.92 mGy. IMPRESSION: C-arm fluoroscopy provided during right hip replacement. Electronically Signed   By: Ivar Drape M.D.   On: 01/29/2017 11:30   Dg Hip Operative Unilat W Or W/o Pelvis Right  Result Date: 01/29/2017 CLINICAL DATA:  Right hip replacement EXAM: OPERATIVE right HIP (WITH PELVIS IF PERFORMED) 2 VIEWS TECHNIQUE: Fluoroscopic  spot image(s) were submitted for interpretation post-operatively. COMPARISON:  None. FINDINGS: Two C-arm spot films show placement of acetabular and femoral components for right total hip replacement. No complicating features are seen. IMPRESSION: Right total hip replacement with no complicating features noted. Electronically Signed   By: Ivar Drape M.D.   On: 01/29/2017 11:29    Disposition: 01-Home or Self Care  Discharge Instructions    Call MD / Call 911   Complete by:  As directed    If you experience chest pain or shortness of breath, CALL 911 and be transported to the hospital emergency room.  If you develope a fever above 101 F, pus (white drainage) or increased drainage or redness at the wound, or calf pain, call your surgeon's office.   Constipation Prevention   Complete by:  As directed    Drink plenty of fluids.  Prune juice may be helpful.  You may use a stool softener, such as Colace (over the counter) 100 mg twice a day.  Use MiraLax (over the counter) for constipation as needed.   Diet - low sodium heart healthy   Complete by:  As directed    Driving restrictions   Complete by:  As directed    No driving for 6 weeks   Increase activity slowly as tolerated   Complete by:  As directed    Lifting restrictions   Complete by:  As directed    No lifting for 6 weeks   TED hose   Complete by:  As directed    Use stockings (TED hose) for 2 weeks on both leg(s).  You may remove them at night for sleeping.      Follow-up Information    Miracle Mongillo, Aaron Edelman, MD. Schedule an appointment as soon as possible for a visit in 2 week(s).   Specialty:  Orthopedic Surgery Why:  For wound re-check Contact information: [address] [place] [address] [phone]        [facility] - Dme Follow up.   Why:  [facility] Contact information: [address] [place] [address] [phone]            Signed: Hilton Cork Ilo Beamon 01/30/2017,  5:34 PM

## 2017-01-30 NOTE — Progress Notes (Signed)
Occupational Therapy Evaluation Patient Details Name: Raymond Ray MRN: [id number] DOB: 1950/10/16 Today's Date: 01/30/2017    History of Present Illness Pt s/p R THR and with hx of MVP and a-fib   Clinical Impression   All OT education completed and pt questions answered. No further OT needs at this time. Will sign off.    Follow Up Recommendations  DC plan and follow up therapy as arranged by surgeon;No OT follow up;Supervision - Intermittent    Equipment Recommendations  3 in 1 bedside commode    Recommendations for Other Services PT consult     Precautions / Restrictions Precautions Precautions: Fall Restrictions Weight Bearing Restrictions: No Other Position/Activity Restrictions: WBAT      Mobility Bed Mobility               General bed mobility comments: NT -- up in recliner  Transfers Overall transfer level: Needs assistance Equipment used: Rolling walker (2 wheeled) Transfers: Sit to/from Stand Sit to Stand: Supervision              Balance                                           ADL either performed or assessed with clinical judgement   ADL Overall ADL's : Needs assistance/impaired Eating/Feeding: Independent   Grooming: Supervision/safety;Standing           Upper Body Dressing : Set up;Sitting   Lower Body Dressing: Minimal assistance;Sit to/from stand   Toilet Transfer: Supervision/safety;Ambulation;BSC;RW   Toileting- Clothing Manipulation and Hygiene: Supervision/safety   Tub/ Shower Transfer: Tub transfer;Min guard;Ambulation;Shower seat;Rolling walker;Grab bars   Functional mobility during ADLs: Supervision/safety;Rolling walker General ADL Comments: Educated patient regarding LB self-care. Patient and wife verbalized understanding. Provided shower transfer handout.     Vision         Perception     Praxis      Pertinent Vitals/Pain Pain Assessment: 0-10 Pain Score: 1  Pain Location: R  hip Pain Descriptors / Indicators: Aching;Sore Pain Intervention(s): Limited activity within patient's tolerance;Monitored during session;Ice applied     Hand Dominance     Extremity/Trunk Assessment Upper Extremity Assessment Upper Extremity Assessment: Overall WFL for tasks assessed   Lower Extremity Assessment Lower Extremity Assessment: Defer to PT evaluation   Cervical / Trunk Assessment Cervical / Trunk Assessment: Normal   Communication Communication Communication: No difficulties   Cognition Arousal/Alertness: Awake/alert Behavior During Therapy: WFL for tasks assessed/performed Overall Cognitive Status: Within Functional Limits for tasks assessed                                     General Comments       Exercises     Shoulder Instructions      Home Living Family/patient expects to be discharged to:: Private residence Living Arrangements: Spouse/significant other Available Help at Discharge: Family Type of Home: House Home Access: Stairs to enter Technical brewer of Steps: 4 Entrance Stairs-Rails: None Home Layout: Two level Alternate Level Stairs-Number of Steps: 14 Alternate Level Stairs-Rails: Right Bathroom Shower/Tub: Teacher, [name] years/pre: Standard(comfort height) Bathroom Accessibility: Yes How Accessible: Accessible via walker Home Equipment: Cane - single point;Shower seat   Additional Comments: borrowed a tub seat and practiced using it PTA      Prior Functioning/Environment  Level of Independence: Independent;Independent with assistive device(s)        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[employer]        OT Problem List: Decreased activity tolerance;Decreased knowledge of use of DME or AE;Pain      OT Treatment/Interventions:      OT Goals(Current goals can be found in the care plan section) Acute Rehab OT Goals Patient Stated Goal: Regain IND and walk without pain OT Goal Formulation: All assessment and  education complete, DC therapy  OT Frequency:     Barriers to D/C:            Co-evaluation              AM-PAC PT "6 Clicks" Daily Activity     Outcome Measure Help from another person eating meals?: None Help from another person taking care of personal grooming?: A Little Help from another person toileting, which includes using toliet, bedpan, or urinal?: A Little Help from another person bathing (including washing, rinsing, drying)?: A Little Help from another person to put on and taking off regular upper body clothing?: None Help from another person to put on and taking off regular lower body clothing?: A Little 6 Click Score: 20   End of Session Equipment Utilized During Treatment: Rolling walker Nurse Communication: Mobility status  Activity Tolerance: Patient tolerated treatment well Patient left: in chair;with call bell/phone within reach;with family/visitor present  OT Visit Diagnosis: Unsteadiness on feet (R26.81);Muscle weakness (generalized) (M62.81)                Time: 5009-3818 OT Time Calculation (min): 32 min Charges:  OT General Charges $OT Visit: 1 Visit OT Evaluation $OT Eval Low Complexity: 1 Low OT Treatments $Self Care/Home Management : 8-22 mins G-Codes:       Caycee Wanat A Aunna Snooks 22-Feb-2017, 11:40 AM

## 2017-01-30 NOTE — Progress Notes (Signed)
Physical Therapy Treatment Patient Details Name: Raymond Ray MRN: [id number] DOB: [birth year] Today's Date: 01/30/2017    History of Present Illness Pt s/p R THR and with hx of MVP and a-fib    PT Comments    Pt progressing well and eager for dc home.  Spouse present and reviewed stairs, car transfers and home therex with progression.  Written instructions provided.   Follow Up Recommendations  DC plan and follow up therapy as arranged by surgeon     Equipment Recommendations  Rolling walker with 5" wheels    Recommendations for Other Services OT consult     Precautions / Restrictions Precautions Precautions: Fall Restrictions Weight Bearing Restrictions: No Other Position/Activity Restrictions: WBAT    Mobility  Bed Mobility Overal bed mobility: Needs Assistance Bed Mobility: Supine to Sit;Sit to Supine     Supine to sit: Min guard;Supervision Sit to supine: Min guard;Supervision   General bed mobility comments: min cues for sequence and use of L LE to self assist  Transfers Overall transfer level: Needs assistance Equipment used: Rolling walker (2 wheeled) Transfers: Sit to/from Stand Sit to Stand: Supervision         General transfer comment: pt self-cues for LE management and use of UEs to self assist  Ambulation/Gait Ambulation/Gait assistance: Min guard;Supervision Ambulation Distance (Feet): 400 Feet Assistive device: Rolling walker (2 wheeled) Gait Pattern/deviations: Step-to pattern;Step-through pattern;Shuffle Gait velocity: decr Gait velocity interpretation: Below normal speed for age/gender General Gait Details: min cues for posture, position from RW and initial sequence   Stairs Stairs: Yes   Stair Management: No rails;One rail Left;Step to pattern;Forwards;With cane;With walker;Backwards Number of Stairs: 20 General stair comments: 3 x 4 stairs bkwd with RW; 2 x 4 stairs fwd with cane and rail.  Spouse assisting.  Written instruction  provided.  Cues for sequence and foot/RW/cane placement.  Wheelchair Mobility    Modified Rankin (Stroke Patients Only)       Balance Overall balance assessment: No apparent balance deficits (not formally assessed)                                          Cognition Arousal/Alertness: Awake/alert Behavior During Therapy: WFL for tasks assessed/performed Overall Cognitive Status: Within Functional Limits for tasks assessed                                        Exercises Total Joint Exercises Ankle Circles/Pumps: AROM;Both;15 reps;Supine Quad Sets: AROM;Both;10 reps;Supine Heel Slides: AAROM;Right;20 reps;Supine Hip ABduction/ADduction: AAROM;Right;15 reps;Supine Long Arc Quad: AROM;Right;15 reps;Seated    General Comments        Pertinent Vitals/Pain Pain Assessment: 0-10 Pain Score: 2  Pain Location: R hip Pain Descriptors / Indicators: Aching;Sore Pain Intervention(s): Limited activity within patient's tolerance;Monitored during session;Ice applied    Home Living                      Prior Function            PT Goals (current goals can now be found in the care plan section) Acute Rehab PT Goals Patient Stated Goal: Regain IND and walk without pain PT Goal Formulation: With patient Time For Goal Achievement: 01/31/17 Potential to Achieve Goals: Good Progress towards PT goals: Progressing toward goals  Frequency    7X/week      PT Plan Current plan remains appropriate    Co-evaluation              AM-PAC PT "6 Clicks" Daily Activity  Outcome Measure  Difficulty turning over in bed (including adjusting bedclothes, sheets and blankets)?: A Lot Difficulty moving from lying on back to sitting on the side of the bed? : A Lot Difficulty sitting down on and standing up from a chair with arms (e.g., wheelchair, bedside commode, etc,.)?: A Little Help needed moving to and from a bed to chair (including a  wheelchair)?: A Little Help needed walking in hospital room?: A Little Help needed climbing 3-5 steps with a railing? : A Little 6 Click Score: 16    End of Session Equipment Utilized During Treatment: Gait belt Activity Tolerance: Patient tolerated treatment well Patient left: Other (comment)(sitting EOB with spouse) Nurse Communication: Mobility status PT Visit Diagnosis: Difficulty in walking, not elsewhere classified (R26.2)     Time: 3818-2993 PT Time Calculation (min) (ACUTE ONLY): 64 min  Charges:  $Gait Training: 8-22 mins $Therapeutic Exercise: 23-37 mins $Therapeutic Activity: 8-22 mins                    G Codes:       Pg [phone]    Devona Holmes 01/30/2017, 3:20 PM

## 2017-01-30 NOTE — Progress Notes (Signed)
Physical Therapy Treatment Patient Details Name: Raymond Ray MRN: [id number] DOB: 24-Nov-1950 Today's Date: 01/30/2017    History of Present Illness Pt s/p R THR and with hx of MVP and a-fib    PT Comments    Pt very motivated, progressing well with mobility and hopeful for dc home this pm.   Follow Up Recommendations  DC plan and follow up therapy as arranged by surgeon     Equipment Recommendations  Rolling walker with 5" wheels    Recommendations for Other Services OT consult     Precautions / Restrictions Precautions Precautions: Fall Restrictions Weight Bearing Restrictions: No Other Position/Activity Restrictions: WBAT    Mobility  Bed Mobility Overal bed mobility: Needs Assistance Bed Mobility: Supine to Sit     Supine to sit: Min assist     General bed mobility comments: cues for sequence and use of L LE to self assist  Transfers Overall transfer level: Needs assistance Equipment used: Rolling walker (2 wheeled) Transfers: Sit to/from Stand Sit to Stand: Min guard         General transfer comment: cues for LE management and use of UEs to self assist  Ambulation/Gait Ambulation/Gait assistance: Min assist;Min guard Ambulation Distance (Feet): 240 Feet Assistive device: Rolling walker (2 wheeled) Gait Pattern/deviations: Step-to pattern;Step-through pattern;Shuffle Gait velocity: decr Gait velocity interpretation: Below normal speed for age/gender General Gait Details: cues for posture, position from RW and initial sequence   Stairs            Wheelchair Mobility    Modified Rankin (Stroke Patients Only)       Balance                                            Cognition Arousal/Alertness: Awake/alert Behavior During Therapy: WFL for tasks assessed/performed Overall Cognitive Status: Within Functional Limits for tasks assessed                                        Exercises Total Joint  Exercises Ankle Circles/Pumps: AROM;Both;15 reps;Supine Quad Sets: AROM;Both;10 reps;Supine Heel Slides: AAROM;Right;20 reps;Supine Hip ABduction/ADduction: AAROM;Right;15 reps;Supine    General Comments        Pertinent Vitals/Pain Pain Assessment: 0-10 Pain Score: 1  Pain Location: R hip Pain Descriptors / Indicators: Aching;Sore Pain Intervention(s): Limited activity within patient's tolerance;Monitored during session;Ice applied    Home Living Family/patient expects to be discharged to:: Private residence Living Arrangements: Spouse/significant other Available Help at Discharge: Family Type of Home: House Home Access: Stairs to enter Entrance Stairs-Rails: None Home Layout: Two level Home Equipment: Cane - single point;Shower seat Additional Comments: borrowed a tub seat and practiced using it PTA    Prior Function Level of Independence: Independent;Independent with assistive device(s)      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 Goals (current goals can now be found in the care plan section) Acute Rehab PT Goals Patient Stated Goal: Regain IND and walk without pain PT Goal Formulation: With patient Time For Goal Achievement: 01/31/17 Potential to Achieve Goals: Good Progress towards PT goals: Progressing toward goals    Frequency    7X/week      PT Plan Current plan remains appropriate    Co-evaluation  AM-PAC PT "6 Clicks" Daily Activity  Outcome Measure  Difficulty turning over in bed (including adjusting bedclothes, sheets and blankets)?: Unable Difficulty moving from lying on back to sitting on the side of the bed? : Unable Difficulty sitting down on and standing up from a chair with arms (e.g., wheelchair, bedside commode, etc,.)?: A Lot Help needed moving to and from a bed to chair (including a wheelchair)?: A Little Help needed walking in hospital room?: A Little Help needed climbing 3-5 steps with a railing? : A Little 6  Click Score: 13    End of Session Equipment Utilized During Treatment: Gait belt Activity Tolerance: Patient tolerated treatment well Patient left: in chair;with call bell/phone within reach;with family/visitor present;with chair alarm set Nurse Communication: Mobility status PT Visit Diagnosis: Difficulty in walking, not elsewhere classified (R26.2)     Time: 3893-7342 PT Time Calculation (min) (ACUTE ONLY): 40 min  Charges:  $Gait Training: 23-37 mins $Therapeutic Exercise: 8-22 mins                    G Codes:       Pg [phone]    Myriam Brandhorst 01/30/2017, 11:46 AM

## 2017-01-30 NOTE — Progress Notes (Addendum)
   Subjective:  Patient reports pain as mild to moderate.  Denies N/V/CP/SOB.  Objective:   VITALS:   Vitals:   01/29/17 2215 01/30/17 0240 01/30/17 0620 01/30/17 0852  BP: (!) 101/52 (!) 115/58 (!) 116/59 122/66  Pulse: 81 78 79 80  Resp: 16 16 17 18   Temp: 98.1 F (36.7 C) 97.6 F (36.4 C) 98.1 F (36.7 C) 98 F (36.7 C)  TempSrc: Oral Oral Oral   SpO2: 99% 100% 100% 100%  Weight:      Height:        NAD ABD soft Sensation intact distally Intact pulses distally Dorsiflexion/Plantar flexion intact Incision: dressing C/D/I Compartment soft   Lab Results  Component Value Date   WBC 12.1 (H) 01/30/2017   HGB 9.2 (L) 01/30/2017   HCT 27.0 (L) 01/30/2017   MCV 100.7 (H) 01/30/2017   PLT 232 01/30/2017   BMET    Component Value Date/Time   NA 139 01/30/2017 0505   NA 142 01/07/2017 1051   K 4.2 01/30/2017 0505   CL 111 01/30/2017 0505   CO2 22 01/30/2017 0505   GLUCOSE 236 (H) 01/30/2017 0505   BUN 23 (H) 01/30/2017 0505   BUN 25 01/07/2017 1051   CREATININE 1.00 01/30/2017 0505   CALCIUM 8.2 (L) 01/30/2017 0505   GFRNONAA >60 01/30/2017 0505   GFRAA >60 01/30/2017 0505     Assessment/Plan: 1 Day Post-Op   Principal Problem:   Osteoarthritis of right hip   WBAT with walker DVT ppx: eliquis, SCDs, TEDs PO pain control PT/OT D/C home with HEP   Raymond Ray 01/30/2017, 10:46 AM   Rod Can, MD Cell [phone]

## 2017-01-30 NOTE — Progress Notes (Signed)
Inpatient Diabetes Program Recommendations  AACE/ADA: New Consensus Statement on Inpatient Glycemic Control (2015)  Target Ranges:  Prepandial:   less than 140 mg/dL      Peak postprandial:   less than 180 mg/dL (1-2 hours)      Critically ill patients:  140 - 180 mg/dL   Results for LAVELL, SUPPLE (MRN [id number]) as of 01/30/2017 08:41  Ref. Range 01/30/2017 05:05  Glucose Latest Ref Range: 65 - 99 mg/dL 236 (H)   Review of Glycemic Control   Inpatient Diabetes Program Recommendations:     Glucose 236 this am. If patient is not discharged, Consider CBGs and Novolog Sensitive Correction tid.  Thanks,  Tama Headings RN, MSN, [facility] Inpatient Diabetes Coordinator Team Pager [phone] (8a-5p)

## 2017-02-22 ENCOUNTER — Other Ambulatory Visit: Payer: Self-pay | Admitting: Cardiovascular Disease

## 2017-02-23 NOTE — Telephone Encounter (Signed)
Please review for refill, thanks ! 

## 2017-03-20 ENCOUNTER — Other Ambulatory Visit: Payer: Self-pay | Admitting: Cardiovascular Disease

## 2017-03-20 NOTE — Telephone Encounter (Signed)
Please review for refill, Thanks !  

## 2017-05-17 ENCOUNTER — Other Ambulatory Visit: Payer: Self-pay | Admitting: Cardiovascular Disease

## 2017-07-18 ENCOUNTER — Other Ambulatory Visit: Payer: Self-pay | Admitting: Cardiovascular Disease

## 2017-07-21 ENCOUNTER — Other Ambulatory Visit: Payer: Self-pay

## 2017-07-22 MED ORDER — APIXABAN 5 MG PO TABS: 5.0000 mg | ORAL_TABLET | Freq: Two times a day (BID) | ORAL | 1 refills | Status: DC

## 2017-09-23 ENCOUNTER — Telehealth: Payer: Self-pay | Admitting: Cardiovascular Disease

## 2017-09-23 NOTE — Telephone Encounter (Addendum)
New message   Patient also requesting written prescription to be written for him to pick up.    1. Which medications need to be refilled? (please list name of each medication and dose if known) sildenafil (REVATIO) 20 MG tablet  2. Which pharmacy/location (including street and city if local pharmacy) is medication to be sent to? Fax [phone] [facility], phone [phone]  3. Do they need a 30 day or 90 day supply? 20 tablets of 25mg  per patient

## 2017-09-28 MED ORDER — SILDENAFIL CITRATE 20 MG PO TABS: ORAL_TABLET | ORAL | 3 refills | Status: DC

## 2017-09-28 NOTE — Telephone Encounter (Signed)
Rx at front ready for pick up

## 2017-09-28 NOTE — Telephone Encounter (Signed)
OK to refill

## 2017-09-28 NOTE — Telephone Encounter (Signed)
[id number]  

## 2017-09-29 NOTE — Telephone Encounter (Signed)
TRIED TO CALL PT NAME ON VM IS A DIFFERENT NAME-[id number] [facility]

## 2017-10-01 NOTE — Telephone Encounter (Signed)
Advised patient

## 2017-11-30 DIAGNOSIS — M25559 Pain in unspecified hip: Secondary | ICD-10-CM | POA: Insufficient documentation

## 2018-01-06 NOTE — Progress Notes (Signed)
Cardiology Office Note   Date:  01/07/2018   ID:  Danton Sewer, DOB 03-01-1950, MRN [id number]  PCP:  Dione Housekeeper, MD  Cardiologist:   [name], MD   Chief Complaint  Patient presents with  . Atrial Fibrillation      History of Present Illness: Kenyata Guess is a 67 y.o. male with chronic atrial fibrillation and mitral regurgitation 2/2 MVP who presents for follow up.  Rev. Doris was previously a patient of Dr. Mare Ferrari.  He first developed atrial fibrillation in 1986.  He was asymptomatic and it was discovered on physical exam.  He was initially treated with amiodarone and this was discontinued due to concern for long-term toxicity.  He was then on quinidine and this was transitioned to digoxin when he moved from [place] to the [place].  He has remained asymptomatic.  Rev. Mcaffee had an echo 10/09/09 that showed normal systolic function and posterior mitral valve prolapse with moderate mitral regurgitation and normal PA pressures.  He had a nuclear stress 04/29/02 showing no ischemia and an ejection fraction of 55%.  He developed epistaxis on Xarelto and switched back to Coumadin with an INR goal of 1.8-2.2.  However, at his last appointment he was switched to Eliquis.  Since then he has experienced one mild nose bleed in the setting of accidentally taking an extra dose.  At his last appointment Rev. Jurney had severe vertigo.  This resolved and he denies any recurrent episodes.  Since his last appointmnent he had his R hip replaced 01/2017.  He recovered from this without complication.  He continues to feel well.  He exercises 3 days/week on the elliptical.  He also works out with a Physiological scientist 2 days/week.  He has no exertional chest pain or shortness of breath.  He has some mild edema at the end of the day sometimes but it improves with elevation of his legs.  He denies orthopnea or PND.  His only complaint is pain in his feet at times.   Past Medical History:    Diagnosis Date  . Allergy   . Chronic atrial fibrillation   . History of colonoscopy 04/2001   negative  . Mitral valve prolapse   . Osteoarthritis of hip   . PONV (postoperative nausea and vomiting)    left knee cartilage removal;    . Vertigo 01/07/2017   currently not symptomatic     Past Surgical History:  Procedure Laterality Date  . COLONOSCOPY    . KNEE SURGERY     left at age 42 in [place]  . spinal injection     December 2013  . TOTAL HIP ARTHROPLASTY Right 01/29/2017   Procedure: RIGHT TOTAL HIP ARTHROPLASTY ANTERIOR APPROACH;  Surgeon: Rod Can, MD;  Location: [facility];  Service: Orthopedics;  Laterality: Right;  Marland Kitchen VASECTOMY       Current Outpatient Medications  Medication Sig Dispense Refill  . acetaminophen (TYLENOL) 650 MG CR tablet Take 650 mg by mouth every 8 (eight) hours as needed for pain.    Marland Kitchen apixaban (ELIQUIS) 5 MG TABS tablet Take 1 tablet (5 mg total) by mouth 2 (two) times daily. 180 tablet 1  . Cyanocobalamin (B-12) 500 MCG TABS Take 500 mcg by mouth daily.    . fexofenadine (ALLEGRA) 180 MG tablet Take 90 mg by mouth daily as needed for allergies (for allergies or skin itching).     . metoprolol tartrate (LOPRESSOR) 50 MG tablet TAKE ONE-HALF TABLET BY  MOUTH DAILY 45 tablet 3  . Multiple Vitamin (MULTIVITAMIN) tablet Take 1 tablet daily by mouth.    Marland Kitchen oxycodone (OXY-IR) 5 MG capsule Take 5 mg by mouth every 4 (four) hours as needed.    . sildenafil (VIAGRA) 100 MG tablet TAKE 1/2 TO 1  (100 MG DOSE) BY MOUTH DAILY AS NEEDED FOR ERECTILE DYSFUNCTION. 10 tablet 5  . zolpidem (AMBIEN) 10 MG tablet Take 10 mg by mouth at bedtime.  30 tablet 0   No current facility-administered medications for this visit.     Allergies:   Patient has no known allergies.    Social History:  The patient  reports that he has never smoked. He has never used smokeless tobacco. He reports that he does not drink alcohol or use drugs.   Family History:  The  patient's family history includes Arrhythmia in his father; Breast cancer in his sister; Cancer in his mother; Heart attack in his mother.    ROS:  Please see the history of present illness.   Otherwise, review of systems are positive for none.   All other systems are reviewed and negative.    PHYSICAL EXAM: VS:  BP 119/79   Pulse 69   Resp 16   Ht 6\' 3"  (1.905 m)   Wt 180 lb (81.6 kg)   SpO2 100%   BMI 22.50 kg/m  , BMI Body mass index is 22.5 kg/m. GENERAL:  Well appearing HEENT: Pupils equal round and reactive, fundi not visualized, oral mucosa unremarkable NECK:  No jugular venous distention, waveform within normal limits, carotid upstroke brisk and symmetric, no bruits, no thyromegaly LYMPHATICS:  No cervical adenopathy LUNGS:  Clear to auscultation bilaterally HEART:  Irregularly irregular.  PMI not displaced or sustained,S1 and S2 within normal limits, no S3, no S4, no clicks, no rubs, no murmurs ABD:  Flat, positive bowel sounds normal in frequency in pitch, no bruits, no rebound, no guarding, no midline pulsatile mass, no hepatomegaly, no splenomegaly EXT:  2 plus pulses throughout, no edema, no cyanosis no clubbing SKIN:  No rashes no nodules NEURO:  Cranial nerves II through XII grossly intact, motor grossly intact throughout PSYCH:  Cognitively intact, oriented to person place and time   EKG:  EKG is ordered today. The ekg ordered 10/15/15 demonstrates atrial fibrillation rate 63 bpm.   01/07/17: Atrial fibrillation.  Rate 79 bpm.  01/07/18: Atrial fibrillation.  Rate 69 bpm.   Recent Labs: 01/23/2017: ALT 14 01/30/2017: BUN 23; Creatinine, Ser 1.00; Hemoglobin 9.2; Platelets 232; Potassium 4.2; Sodium 139    Lipid Panel    Component Value Date/Time   CHOL 166 03/03/2011 0922   TRIG 41.0 03/03/2011 0922   HDL 55.80 03/03/2011 0922   CHOLHDL 3 03/03/2011 0922   VLDL 8.2 03/03/2011 0922   LDLCALC 102 (H) 03/03/2011 0922      Wt Readings from Last 3  Encounters:  01/07/18 180 lb (81.6 kg)  01/29/17 173 lb (78.5 kg)  01/23/17 173 lb (78.5 kg)      ASSESSMENT AND PLAN:  # Vertigo:Resolved.  # Atrial fibrillation: Rates remain well-controlled and he is asymptomatic. Continue Eliquis.  At his last appointment digoxin was subtherapeutic.  He continues to remain asymptomatic and his rates have been very well-controlled.  We will plan to try stopping digoxin.  Continue metoprolol.  He will check his heart rate periodically and call if he notes that his resting heart rate is over 100 bpm.  This patients CHA2DS2-VASc Score and  unadjusted Ischemic Stroke Rate (% per year) is equal to 0.6 % stroke rate/year from a score of 1  Above score calculated as 1 point each if present [CHF, HTN, DM, Vascular=MI/PAD/Aortic Plaque, Age if 65-74, or Male] Above score calculated as 2 points each if present [Age > 75, or Stroke/TIA/TE]  # Mitral regurgitation: No evidence of heart failure on exam.  He had moderate mitral regurgitation and mitral valve prolapse on echo in 2011.  He also had moderate tricuspid regurgitation at that time.  We will repeat an echocardiogram in 6 months to reestablish a baseline and ensure there is no progression of disease.   Current medicines are reviewed at length with the patient today.  The patient does not have concerns regarding medicines.  The following changes have been made: none  Labs/ tests ordered today include:   Orders Placed This Encounter  Procedures  . EKG 12-Lead  . ECHOCARDIOGRAM COMPLETE     Disposition:   FU with Charlsey Moragne C. Oval Linsey, MD, [facility] in 6 months.  If stable will go back to once per year.      Signed, Shany Marinez C. Oval Linsey, MD, [facility]  01/07/2018 5:10 PM    [facility]

## 2018-01-07 ENCOUNTER — Encounter: Payer: Self-pay | Admitting: Cardiovascular Disease

## 2018-01-07 ENCOUNTER — Ambulatory Visit: Payer: 59 | Admitting: Cardiovascular Disease

## 2018-01-07 VITALS — BP 119/79 | HR 69 | Resp 16 | Ht 75.0 in | Wt 180.0 lb

## 2018-01-07 DIAGNOSIS — I482 Chronic atrial fibrillation, unspecified: Secondary | ICD-10-CM | POA: Diagnosis not present

## 2018-01-07 DIAGNOSIS — I341 Nonrheumatic mitral (valve) prolapse: Secondary | ICD-10-CM | POA: Diagnosis not present

## 2018-01-07 DIAGNOSIS — I340 Nonrheumatic mitral (valve) insufficiency: Secondary | ICD-10-CM

## 2018-01-07 MED ORDER — SILDENAFIL CITRATE 100 MG PO TABS: ORAL_TABLET | ORAL | 5 refills | Status: DC

## 2018-01-07 NOTE — Patient Instructions (Addendum)
Medication Instructions:  STOP DIGOXIN MONITOR YOUR HEART RATE AT HOME CALL THE OFFICE IS ABOVE 100  If you need a refill on your cardiac medications before your next appointment, please call your pharmacy.   Lab work: NONE  Testing/Procedures: Your physician has requested that you have an echocardiogram. Echocardiography is a painless test that uses sound waves to create images of your heart. It provides your doctor with information about the size and shape of your heart and how well your heart's chambers and valves are working. This procedure takes approximately one hour. There are no restrictions for this procedure.  TO BE DONE IN 6 MONTH,  PRIOR TO YOUR FOLLOW UP VISIT  Follow-Up: At [facility], you and your health needs are our priority.  As part of our continuing mission to provide you with exceptional heart care, we have created designated Provider Care Teams.  These Care Teams include your primary Cardiologist (physician) and Advanced Practice Providers (APPs -  Physician Assistants and Nurse Practitioners) who all work together to provide you with the care you need, when you need it. You will need a follow up appointment in 6 months.  Please call our office 2 months in advance to schedule this appointment.  You may see DR [facility]  or one of the following Advanced Practice Providers on your designated Care Team:   Kerin Ransom, PA-C Roby Lofts, [place] . Sande Rives, PA-C

## 2018-01-09 ENCOUNTER — Other Ambulatory Visit: Payer: Self-pay | Admitting: Cardiovascular Disease

## 2018-03-03 DIAGNOSIS — L720 Epidermal cyst: Secondary | ICD-10-CM | POA: Diagnosis not present

## 2018-03-03 DIAGNOSIS — L570 Actinic keratosis: Secondary | ICD-10-CM | POA: Diagnosis not present

## 2018-03-03 DIAGNOSIS — L111 Transient acantholytic dermatosis [Grover]: Secondary | ICD-10-CM | POA: Diagnosis not present

## 2018-03-03 DIAGNOSIS — Z85828 Personal history of other malignant neoplasm of skin: Secondary | ICD-10-CM | POA: Diagnosis not present

## 2018-03-05 ENCOUNTER — Other Ambulatory Visit: Payer: Self-pay

## 2018-03-05 ENCOUNTER — Ambulatory Visit (HOSPITAL_COMMUNITY): Payer: Medicare Other | Attending: Cardiovascular Disease

## 2018-03-05 DIAGNOSIS — I482 Chronic atrial fibrillation, unspecified: Secondary | ICD-10-CM

## 2018-03-05 DIAGNOSIS — I340 Nonrheumatic mitral (valve) insufficiency: Secondary | ICD-10-CM | POA: Insufficient documentation

## 2018-03-05 DIAGNOSIS — I341 Nonrheumatic mitral (valve) prolapse: Secondary | ICD-10-CM | POA: Insufficient documentation

## 2018-03-17 ENCOUNTER — Other Ambulatory Visit: Payer: Self-pay | Admitting: Cardiovascular Disease

## 2018-04-02 ENCOUNTER — Other Ambulatory Visit: Payer: Self-pay | Admitting: Cardiovascular Disease

## 2018-04-30 DIAGNOSIS — Z79899 Other long term (current) drug therapy: Secondary | ICD-10-CM | POA: Diagnosis not present

## 2018-04-30 DIAGNOSIS — Z6822 Body mass index (BMI) 22.0-22.9, adult: Secondary | ICD-10-CM | POA: Diagnosis not present

## 2018-04-30 DIAGNOSIS — G4709 Other insomnia: Secondary | ICD-10-CM | POA: Diagnosis not present

## 2018-04-30 DIAGNOSIS — I4891 Unspecified atrial fibrillation: Secondary | ICD-10-CM | POA: Diagnosis not present

## 2018-04-30 DIAGNOSIS — G608 Other hereditary and idiopathic neuropathies: Secondary | ICD-10-CM | POA: Diagnosis not present

## 2018-04-30 DIAGNOSIS — I341 Nonrheumatic mitral (valve) prolapse: Secondary | ICD-10-CM | POA: Diagnosis not present

## 2018-04-30 DIAGNOSIS — Z0000 Encounter for general adult medical examination without abnormal findings: Secondary | ICD-10-CM | POA: Diagnosis not present

## 2018-04-30 DIAGNOSIS — Z125 Encounter for screening for malignant neoplasm of prostate: Secondary | ICD-10-CM | POA: Diagnosis not present

## 2018-04-30 DIAGNOSIS — M79673 Pain in unspecified foot: Secondary | ICD-10-CM | POA: Diagnosis not present

## 2018-04-30 DIAGNOSIS — N528 Other male erectile dysfunction: Secondary | ICD-10-CM | POA: Diagnosis not present

## 2018-04-30 DIAGNOSIS — G609 Hereditary and idiopathic neuropathy, unspecified: Secondary | ICD-10-CM | POA: Diagnosis not present

## 2018-04-30 DIAGNOSIS — L111 Transient acantholytic dermatosis [Grover]: Secondary | ICD-10-CM | POA: Diagnosis not present

## 2018-05-03 ENCOUNTER — Encounter: Payer: Self-pay | Admitting: Neurology

## 2018-05-03 DIAGNOSIS — L111 Transient acantholytic dermatosis [Grover]: Secondary | ICD-10-CM | POA: Diagnosis not present

## 2018-05-03 DIAGNOSIS — E611 Iron deficiency: Secondary | ICD-10-CM | POA: Diagnosis not present

## 2018-05-11 DIAGNOSIS — R195 Other fecal abnormalities: Secondary | ICD-10-CM | POA: Diagnosis not present

## 2018-05-20 DIAGNOSIS — R972 Elevated prostate specific antigen [PSA]: Secondary | ICD-10-CM | POA: Diagnosis not present

## 2018-05-25 ENCOUNTER — Ambulatory Visit (INDEPENDENT_AMBULATORY_CARE_PROVIDER_SITE_OTHER): Payer: Medicare Other | Admitting: Gastroenterology

## 2018-05-25 ENCOUNTER — Encounter: Payer: Self-pay | Admitting: Gastroenterology

## 2018-05-25 ENCOUNTER — Other Ambulatory Visit: Payer: Self-pay

## 2018-05-25 ENCOUNTER — Telehealth: Payer: Self-pay | Admitting: Emergency Medicine

## 2018-05-25 DIAGNOSIS — R195 Other fecal abnormalities: Secondary | ICD-10-CM | POA: Diagnosis not present

## 2018-05-25 NOTE — Patient Instructions (Signed)
I will obtain your recent blood counts from Dr. Joylene Draft.  I have recommended a colonoscopy to evaluate the unexplained blood in your stool. We will coordinate with your cardiologist to be sure that you can hold the Eliquis prior to the procedure.  We will call to schedule the colonoscopy as soon as Covid19 restrictions have been lifted.   Thank you for your patience with me and our technology today! I am glad that you could ultimately hear me better. Please stay home, safe, and healthy. I look forward to meeting you in person in the future.

## 2018-05-25 NOTE — Telephone Encounter (Signed)
Patient with diagnosis of afib on Eliquis for anticoagulation.    Procedure: colonoscopy Date of procedure: TBD  CHADS2-VASc score of  2 (CHF, HTN, AGE, DM2, stroke/tia x 2, CAD, AGE, male)  Per office protocol, patient can hold Eliquis for 1-2 days prior to procedure.

## 2018-05-25 NOTE — Telephone Encounter (Signed)
Pharm please address eliquis 

## 2018-05-25 NOTE — Progress Notes (Addendum)
TELEHEALTH VISIT  Referring Provider: Crist Infante, MD Primary Care Physician:  Crist Infante, MD   Tele-visit due to COVID-19 pandemic Patient requested visit virtually, consented to the virtual encounter via WebEx Contact made at: 09:30 05/25/18 Patient verified by name and date of birth Location of patient: Home Location provider: [facility] Names of persons participating: Me, patient Time spent on telehealth visit: 30 minutes  Reason for Consultation: Hemoccult positive   IMPRESSION:  Hemoccult positive without associated iron deficiency or overt GI blood loss    - 05/03/2018: iron 107, ferritin 478.5    - 05/11/2018: Hemoccult positive    - No hemoglobin or hematocrit included in referral records Atrial fibrillation on Eliquis Internal hemorrhoids Normal screening colonoscopy with Dr. Deatra Ina 11/15/2012 No known family history of colon cancer or polyps  Hemoccult positive.  Will obtain recent CBC results to confirm that there is no concurrent anemia.  Recommending colonoscopy for now.   I have recommended holding Eliquis for 2 days before endoscopy.  I discussed with the patient that there is a low, but real, risk of a cardiovascular event such as heart attack, stroke, or embolism/thrombosis while off Eliquis. Will communicate by phone or EMR with patient's prescribing provider (Dr. [name]) to confirm that holding the Eliquis is appropriate at this time.     PLAN: Obtain recent CBC results from [place] colonoscopy after Eliquis washout when code restrictions have been lifted  I consented the patient today discussing the risks, benefits, and alternatives to endoscopic evaluation. In particular, we discussed the risks that include, but are not limited to, reaction to medication, cardiopulmonary compromise, bleeding requiring blood transfusion, aspiration resulting in pneumonia, perforation requiring surgery, lack of diagnosis,  severe illness requiring hospitalization, and even death. We reviewed the risk of missed lesion including polyps or even cancer. The patient acknowledges these risks and asks that we proceed.  A total of 30 minutes were spent face-to-face with the patient during this encounter and over half of that time was spent on counseling and coordination of care.     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 The history is obtained to the patient, review of his electronic health record, and review of referral records.   History of atrial fibrillation and mitral regurgitation. He has previously been on warfarin and Xarelto.  He was switched to Eliquis in 2019.  He recently established care with Dr. Joylene Draft.  Routine labs at that time revealed that he was Hemoccult positive.  Per the patient's report he had a colonoscopy in [place] in 2003.  Review of our records show a screening colonoscopy 11/15/2012 with Dr. Deatra Ina that revealed internal hemorrhoids but was otherwise normal.  Labs from 04/30/2018 show normal immunoglobulins, normal B12, normal folate, negative heavy metal screen. Labs from 05/03/2018 show an iron of 107, iron saturation 46%, transferrin 190, ferritin 478.5, iron binding capacity 234. Labs from 05/11/2018 show Hemoccult positive.  Dr. Joylene Draft started iron supplements two weeks ago. Stools are now dark.   No symptoms associated with the anemia.  No fatigue, weakness, headache, irritability, exercise intolerance, exertional dyspnea, vertigo, or angina pectoris.  No pica.  No beeturia.  No hearing loss.    No overt GI blood loss. No melena, hematochezia, bright red blood per rectum. No epistaxis, hemoptysis, or hematuria.   No identified exacerbating or relieving features.  He uses no NSAIDs.  No known family history of colon cancer or polyps except  for a granddaughter with colon polyps before age 52. No family history of uterine/endometrial cancer, pancreatic cancer or  gastric/stomach cancer.  Past Medical History:  Diagnosis Date   Allergy    Chronic atrial fibrillation    History of colonoscopy 04/2001   negative   Mitral valve prolapse    Osteoarthritis of hip    PONV (postoperative nausea and vomiting)    left knee cartilage removal;     Vertigo 01/07/2017   currently not symptomatic     Past Surgical History:  Procedure Laterality Date   COLONOSCOPY     KNEE SURGERY     left at age 84 in [place]   spinal injection     December 2013   TOTAL HIP ARTHROPLASTY Right 01/29/2017   Procedure: RIGHT TOTAL HIP ARTHROPLASTY ANTERIOR APPROACH;  Surgeon: Rod Can, MD;  Location: [facility];  Service: Orthopedics;  Laterality: Right;   VASECTOMY      Current Outpatient Medications  Medication Sig Dispense Refill   acetaminophen (TYLENOL) 650 MG CR tablet Take 650 mg by mouth every 8 (eight) hours as needed for pain.     apixaban (ELIQUIS) 5 MG TABS tablet Take 1 tablet (5 mg total) by mouth 2 (two) times daily. 180 tablet 1   Cyanocobalamin (B-12) 500 MCG TABS Take 500 mcg by mouth daily.     ELIQUIS 5 MG TABS tablet TAKE 1 TABLET BY MOUTH TWICE A DAY 180 tablet 1   fexofenadine (ALLEGRA) 180 MG tablet Take 90 mg by mouth daily as needed for allergies (for allergies or skin itching).      metoprolol tartrate (LOPRESSOR) 50 MG tablet TAKE ONE-HALF TABLET BY  MOUTH DAILY 45 tablet 1   Multiple Vitamin (MULTIVITAMIN) tablet Take 1 tablet daily by mouth.     oxycodone (OXY-IR) 5 MG capsule Take 5 mg by mouth every 4 (four) hours as needed.     sildenafil (VIAGRA) 100 MG tablet TAKE 1/2 TO 1  (100 MG DOSE) BY MOUTH DAILY AS NEEDED FOR ERECTILE DYSFUNCTION. 10 tablet 5   zolpidem (AMBIEN) 10 MG tablet Take 10 mg by mouth at bedtime.  30 tablet 0   No current facility-administered medications for this visit.     Allergies as of 05/25/2018   (No Known Allergies)    Family History  Problem Relation Age of Onset    Arrhythmia Father        tachycardia   Cancer Mother    Heart attack Mother    Breast cancer Sister    Colon cancer Neg Hx    Esophageal cancer Neg Hx    Stomach cancer Neg Hx    Rectal cancer Neg Hx     Social History   Socioeconomic History   Marital status: Married    Spouse name: Not on file   Number of children: Not on file   Years of education: Not on file   Highest education level: Not on file  Occupational History   Not on file  Social Needs   Financial resource strain: Not on file   Food insecurity:    Worry: Not on file    Inability: Not on file   Transportation needs:    Medical: Not on file    Non-medical: Not on file  Tobacco Use   Smoking status: Never Smoker   Smokeless tobacco: Never Used  Substance and Sexual Activity   Alcohol use: No   Drug use: No   Sexual activity: Not on  file  Lifestyle   Physical activity:    Days per week: Not on file    Minutes per session: Not on file   Stress: Not on file  Relationships   Social connections:    Talks on phone: Not on file    Gets together: Not on file    Attends religious service: Not on file    Active member of club or organization: Not on file    Attends meetings of clubs or organizations: Not on file    Relationship status: Not on file   Intimate partner violence:    Fear of current or ex partner: Not on file    Emotionally abused: Not on file    Physically abused: Not on file    Forced sexual activity: Not on file  Other Topics Concern   Not on file  Social History Narrative   Not on file    Review of Systems: ALL ROS discussed and all others negative except listed in HPI.  Physical Exam: General: in no acute distress Neuro: Alert and appropriate Psych: Normal affect and normal insight Examine limited by [employer] Tarri Glenn, MD, MPH [facility] 05/25/2018, 9:51 AM

## 2018-05-25 NOTE — Telephone Encounter (Signed)
[facility] Pre-operative Risk Assessment     Request for surgical clearance:     Endoscopy Procedure  What type of surgery is being performed?  colonoscopy     When is this surgery scheduled? TBD after Covid19 restrictions lifted      What type of clearance is required ?   Pharmacy  Are there any medications that need to be held prior to surgery and how long? Eliquis  Practice name and name of physician performing surgery?      [facility]  What is your office phone and fax number?      Phone- [phone]  [phone]  Anesthesia type (None, local, MAC, general) ?       MAC

## 2018-05-26 NOTE — Telephone Encounter (Signed)
Noted. Once the Covid19 restrictions are lifted we will schedule patient.

## 2018-05-27 ENCOUNTER — Ambulatory Visit: Payer: Medicare Other | Admitting: Gastroenterology

## 2018-05-31 ENCOUNTER — Ambulatory Visit: Payer: Medicare Other | Admitting: Neurology

## 2018-06-03 NOTE — Progress Notes (Signed)
New Patient Virtual Visit via Video Note The purpose of this virtual visit is to provide medical care while limiting exposure to the novel coronavirus.    Consent was obtained for video visit:  Yes.   Answered questions that patient had about telehealth interaction:  Yes.   I discussed the limitations, risks, security and privacy concerns of performing an evaluation and management service by telemedicine. I also discussed with the patient that there may be a patient responsible charge related to this service. The patient expressed understanding and agreed to proceed.  Pt location: Home Physician Location: office Name of referring provider:  Crist Infante, MD I connected with Raymond Ray at patients initiation/request on 06/07/2018 at  1:00 PM EDT by video enabled telemedicine application and verified that I am speaking with the correct person using two identifiers. Pt MRN:  [id number] Pt DOB:  03/31/1950 Video Participants:  Raymond Ray    History of Present Illness: Raymond Ray is a 68 y.o. right-handed Caucasian male with atrial fibrillation presenting for evaluation of bilateral feet numbness and back pain.  Starting around 2014, he remembers taking his family to [facility] and recalls having right heel pain.  Since this time, he developed ongoing right heel pain.  He saw podiatry and was noted to have pes planus and recommended orthotics. Since this time, he has developed numbness and soreness over the soles of the feet.  It is worse with walking. He has found certain shoes are more comfortable.  He was started on amitriptyline 21m at bedtime.  He also complains of achy low back pain for the past 9 years.  He does not have radicular pain.  He does not have leg weakness, imbalance, or falls.  He takes oxycodone as needed for back pain.   He works as a pTheme park managerat [employer]  Highest level of education: Masters   Out-side paper records, electronic medical record, and  images have been reviewed where available and summarized as:  Labs 04/30/2018: Sodium 140, potassium 4.7, chloride 107, glucose 92, creatinine 1.3*, BUN 28*AST 18, ALT 15, TSH 1.37, vitamin D 45, SPEP with IFE no M protein, vitamin B12 1127, folate > 20.0, heavy metal screen negative, ferritin 478  Past Medical History:  Diagnosis Date  . Allergy   . Chronic atrial fibrillation   . History of colonoscopy 04/2001   negative  . Mitral valve prolapse   . Osteoarthritis of hip   . PONV (postoperative nausea and vomiting)    left knee cartilage removal;    . Vertigo 01/07/2017   currently not symptomatic     Past Surgical History:  Procedure Laterality Date  . COLONOSCOPY    . KNEE SURGERY     left at age [age]in [place] . spinal injection     December 2013  . TOTAL HIP ARTHROPLASTY Right 01/29/2017   Procedure: RIGHT TOTAL HIP ARTHROPLASTY ANTERIOR APPROACH;  Surgeon: SRod Can MD;  Location: [facility];  Service: Orthopedics;  Laterality: Right;  .Marland KitchenVASECTOMY       Medications:  Outpatient Encounter Medications as of 06/07/2018  Medication Sig  . acetaminophen (TYLENOL) 650 MG CR tablet Take 650 mg by mouth every 8 (eight) hours as needed for pain.  .Marland Kitchenamitriptyline (ELAVIL) 10 MG tablet TAKE 1 TABLET BY MOUTH AT BEDTIME FOR NEUROPATHY PAIN  . apixaban (ELIQUIS) 5 MG TABS tablet Take 1 tablet (5 mg total) by mouth 2 (two) times daily.  . Cyanocobalamin (B-12) 500  MCG TABS Take 500 mcg by mouth daily.  . Ferrous Sulfate (IRON PO) Take by mouth.  . fexofenadine (ALLEGRA) 180 MG tablet Take 90 mg by mouth daily as needed for allergies (for allergies or skin itching).   . metoprolol tartrate (LOPRESSOR) 50 MG tablet TAKE ONE-HALF TABLET BY  MOUTH DAILY  . Multiple Vitamin (MULTIVITAMIN) tablet Take 1 tablet daily by mouth.  Marland Kitchen oxycodone (OXY-IR) 5 MG capsule Take 5 mg by mouth every 4 (four) hours as needed.  . sildenafil (VIAGRA) 100 MG tablet TAKE 1/2 TO 1  (100 MG DOSE) BY MOUTH  DAILY AS NEEDED FOR ERECTILE DYSFUNCTION.  . tizanidine (ZANAFLEX) 2 MG capsule Take 1 capsule (2 mg total) by mouth at bedtime as needed for muscle spasms.  Marland Kitchen zolpidem (AMBIEN) 10 MG tablet Take 10 mg by mouth at bedtime.   . [DISCONTINUED] ELIQUIS 5 MG TABS tablet TAKE 1 TABLET BY MOUTH TWICE A DAY   No facility-administered encounter medications on file as of 06/07/2018.    ta Allergies: No Known Allergies  Family History: Family History  Problem Relation Age of Onset  . Arrhythmia Father        tachycardia  . Cancer Mother   . Heart attack Mother   . Breast cancer Sister   . Colon cancer Neg Hx   . Esophageal cancer Neg Hx   . Stomach cancer Neg Hx   . Rectal cancer Neg Hx     Social History: Social History   Tobacco Use  . Smoking status: Never Smoker  . Smokeless tobacco: Never Used  Substance Use Topics  . Alcohol use: No  . Drug use: No   Social History   Social History Narrative  . Not on file    Review of Systems:  CONSTITUTIONAL: No fevers, chills, night sweats, or weight loss.   EYES: No visual changes or eye pain ENT: No hearing changes.  No history of nose bleeds.   RESPIRATORY: No cough, wheezing and shortness of breath.   CARDIOVASCULAR: Negative for chest pain, and palpitations.   GI: Negative for abdominal discomfort, blood in stools or black stools.  No recent change in bowel habits.   GU:  No history of incontinence.   MUSCLOSKELETAL: No history of joint pain or swelling.  No myalgias.   SKIN: Negative for lesions, rash, and itching.   HEMATOLOGY/ONCOLOGY: Negative for prolonged bleeding, bruising easily, and swollen nodes.  No history of cancer.   ENDOCRINE: Negative for cold or heat intolerance, polydipsia or goiter.   PSYCH:  No depression or anxiety symptoms.   NEURO: As Above.   General Medical Exam:  Well appearing, comfortable.  Nonlabored breathing.    Neurological Exam: MENTAL STATUS including orientation to time, place, person,  recent and remote memory, attention span and concentration, language, and fund of knowledge is normal.  Speech is not dysarthric.  CRANIAL NERVES:  Normal conjugate, extra-ocular eye movements in all directions of gaze.  No ptosis.  Normal facial symmetry and movements.  Normal shoulder shrug and head rotation.  Tongue is midline.  MOTOR:  Antigravity in all extremities.  No abnormal movements.  No pronator drift.   SENSORY:  Unable to assess   COORDINATION/GAIT: Normal finger to nose bilaterally.  Intact rapid alternating movements bilaterally.  Able to rise from a chair without using arms.  Gait narrow based and stable. He is able to balance on each leg without difficultly.   IMPRESSION/PLAN: 1.  Bilateral feet paresthesias, suspect early peripheral  neuropathy.  No risk factors for neuropathy.  - NCS/EMG of the legs to better characterize the nature of symptoms  - Labs including TSH, vitamin B12, folate acid, SPEP with IFE, and heavy metal screening is normal  - Check ESR, CRP, copper  - Continue amitriptyline 58m at bedtime for one more week, then increase to 240mat bedtime 2.  Chronic low back pain due to lumbar strain and probable arthritis.  History does not suggest nerve impingement.  - Start tizanidine 77m20mt bedtime as needed for pain  - Start low back strengthening program with personal trainer  Follow Up Instructions:  I discussed the assessment and treatment plan with the patient. The patient was provided an opportunity to ask questions and all were answered. The patient agreed with the plan and demonstrated an understanding of the instructions.   The patient was advised to call back or seek an in-person evaluation if the symptoms worsen or if the condition fails to improve as anticipated.  Return to clinic in 4-6 weeks   DonAlda BertholdO

## 2018-06-07 ENCOUNTER — Telehealth (INDEPENDENT_AMBULATORY_CARE_PROVIDER_SITE_OTHER): Payer: Medicare Other | Admitting: Neurology

## 2018-06-07 ENCOUNTER — Encounter: Payer: Self-pay | Admitting: *Deleted

## 2018-06-07 ENCOUNTER — Other Ambulatory Visit: Payer: Self-pay

## 2018-06-07 DIAGNOSIS — R202 Paresthesia of skin: Secondary | ICD-10-CM | POA: Diagnosis not present

## 2018-06-07 DIAGNOSIS — S39012A Strain of muscle, fascia and tendon of lower back, initial encounter: Secondary | ICD-10-CM

## 2018-06-07 MED ORDER — TIZANIDINE HCL 2 MG PO CAPS: 2.0000 mg | ORAL_CAPSULE | Freq: Every evening | ORAL | 1 refills | Status: DC | PRN

## 2018-06-07 NOTE — Progress Notes (Signed)
I will have this scheduled as soon as possible.

## 2018-06-09 ENCOUNTER — Telehealth: Payer: Self-pay | Admitting: Neurology

## 2018-06-09 NOTE — Telephone Encounter (Signed)
EMG appt? Patient called and asked to schedule an EMG appt. I couldn't find anything in his notes for this. Does this need to be scheduled before E visit in May or what? Thanks!

## 2018-06-10 NOTE — Telephone Encounter (Signed)
Informed Chelsea that this is for BLE.

## 2018-06-11 ENCOUNTER — Encounter: Payer: Medicare Other | Admitting: Neurology

## 2018-06-15 ENCOUNTER — Other Ambulatory Visit: Payer: Self-pay | Admitting: *Deleted

## 2018-06-15 DIAGNOSIS — R202 Paresthesia of skin: Secondary | ICD-10-CM

## 2018-06-29 ENCOUNTER — Other Ambulatory Visit: Payer: Self-pay | Admitting: Neurology

## 2018-07-07 ENCOUNTER — Encounter: Payer: Self-pay | Admitting: *Deleted

## 2018-07-07 ENCOUNTER — Telehealth: Payer: Self-pay | Admitting: Cardiovascular Disease

## 2018-07-07 NOTE — Telephone Encounter (Signed)
New Message ° ° ° °Pt is returning call  ° ° ° °Please call back  °

## 2018-07-07 NOTE — Telephone Encounter (Signed)
Lmtcb.  If patient calls back, please ask if he is willing to do virtual visit instead of office visit on May 19 with Dr. Oval Linsey.  If so, please change visit type to virtual.  If not, he can reschedule appointment first available in July or August/dc

## 2018-07-08 ENCOUNTER — Telehealth: Payer: Self-pay | Admitting: Neurology

## 2018-07-08 DIAGNOSIS — D649 Anemia, unspecified: Secondary | ICD-10-CM | POA: Diagnosis not present

## 2018-07-08 DIAGNOSIS — R972 Elevated prostate specific antigen [PSA]: Secondary | ICD-10-CM | POA: Diagnosis not present

## 2018-07-08 DIAGNOSIS — E611 Iron deficiency: Secondary | ICD-10-CM | POA: Diagnosis not present

## 2018-07-08 MED ORDER — AMITRIPTYLINE HCL 25 MG PO TABS: 25.0000 mg | ORAL_TABLET | Freq: Every day | ORAL | 3 refills | Status: DC

## 2018-07-08 NOTE — Telephone Encounter (Signed)
Per Dr Posey Pronto. Amitriptyline comes in 10 or 25mg .  Would the pt like to take two 10mg  qd or 25mg  qd? Dr Posey Pronto is fine with either dose.  Spoke with Mr. Mckeithan and he would like to take 25mg  qd. He would like for the med [facility] per Dr. Posey Pronto sent in Amitriptyline 25mg  qd #90 with 3 refills.

## 2018-07-08 NOTE — Telephone Encounter (Signed)
Patient is calling in about the Medication- amitriptyline. He said the mg increased and that he is needing a new prescription to be sent in for the updated increase of 20mg  [facility]; [phone]. Thanks!

## 2018-07-09 DIAGNOSIS — Z79891 Long term (current) use of opiate analgesic: Secondary | ICD-10-CM | POA: Diagnosis not present

## 2018-07-12 ENCOUNTER — Telehealth: Payer: Self-pay | Admitting: Cardiovascular Disease

## 2018-07-13 ENCOUNTER — Telehealth (INDEPENDENT_AMBULATORY_CARE_PROVIDER_SITE_OTHER): Payer: Medicare Other | Admitting: Cardiovascular Disease

## 2018-07-13 DIAGNOSIS — I340 Nonrheumatic mitral (valve) insufficiency: Secondary | ICD-10-CM | POA: Diagnosis not present

## 2018-07-13 DIAGNOSIS — D500 Iron deficiency anemia secondary to blood loss (chronic): Secondary | ICD-10-CM | POA: Diagnosis not present

## 2018-07-13 DIAGNOSIS — I4819 Other persistent atrial fibrillation: Secondary | ICD-10-CM

## 2018-07-13 NOTE — Progress Notes (Signed)
Virtual Visit via Video Note   This visit type was conducted due to national recommendations for restrictions regarding the COVID-19 Pandemic (e.g. social distancing) in an effort to limit this patient's exposure and mitigate transmission in our community.  Due to his co-morbid illnesses, this patient is at least at moderate risk for complications without adequate follow up.  This format is felt to be most appropriate for this patient at this time.  All issues noted in this document were discussed and addressed.  A limited physical exam was performed with this format.  Please refer to the patient's chart for his consent to telehealth for [facility].   Date:  07/13/2018   ID:  Danton Sewer, DOB 07-15-1950, MRN [id number]  Patient Location: Home Provider Location: Office  PCP:  Crist Infante, MD  Cardiologist:  [name], MD  Electrophysiologist:  None   Evaluation Performed:  Follow-Up Visit  Chief Complaint:  Atrial fibrillation  History of Present Illness:    Kjell Brannen is a 68 y.o. male with chronic atrial fibrillation and mitral regurgitation 2/2 MVP who presents for follow up.  Rev. Hollenbeck was previously a patient of Dr. Mare Ferrari.  He first developed atrial fibrillation in 1986.  He was asymptomatic and it was discovered on physical exam.  He was initially treated with amiodarone and this was discontinued due to concern for long-term toxicity.  He was then on quinidine and this was transitioned to digoxin when he moved from [place] to the [place].  He has remained asymptomatic.  Rev. Birdsell had an echo 10/09/09 that showed normal systolic function and posterior mitral valve prolapse with moderate mitral regurgitation and normal PA pressures.  He had a nuclear stress 04/29/02 showing no ischemia and an ejection fraction of 55%.  He developed epistaxis on Xarelto and switched back to Coumadin with an INR goal of 1.8-2.2.  However,  He switch to Eliquis with no overt bleeding.   Since his last appointment Dr. Sabra Heck was noted to have a low iron levels and anemia.  He was started on iron supplementation and has continued on Eliquis.  He has not noted any melena or hematochezia.  He is scheduled for a colonoscopy but this has been delayed due to COVID-19.  He continues to exercise but has been somewhat limited by pain in his legs.  He is followed up with neurology and is scheduled for an EMG once the restrictions on nonemergent testing are lifted.  He has otherwise been doing well and denies chest pain, shortness of breath, orthopnea or PND.  He rarely has shortness of breath when he runs upstairs but feels well with exercise.  He occasionally has lower extremity edema that improves with elevation of his legs and is not persistent by morning.  He has not noted any palpitations, lightheadedness, or dizziness.   The patient does not have symptoms concerning for COVID-19 infection (fever, chills, cough, or new shortness of breath).    Past Medical History:  Diagnosis Date  . Allergy   . Chronic atrial fibrillation   . History of colonoscopy 04/2001   negative  . Mitral valve prolapse   . Osteoarthritis of hip   . PONV (postoperative nausea and vomiting)    left knee cartilage removal;    . Vertigo 01/07/2017   currently not symptomatic    Past Surgical History:  Procedure Laterality Date  . COLONOSCOPY    . KNEE SURGERY     left at age 17 in [place]  .  spinal injection     December 2013  . TOTAL HIP ARTHROPLASTY Right 01/29/2017   Procedure: RIGHT TOTAL HIP ARTHROPLASTY ANTERIOR APPROACH;  Surgeon: Rod Can, MD;  Location: [facility];  Service: Orthopedics;  Laterality: Right;  Marland Kitchen VASECTOMY       Current Meds  Medication Sig  . acetaminophen (TYLENOL) 650 MG CR tablet Take 650 mg by mouth every 8 (eight) hours as needed for pain.  Marland Kitchen amitriptyline (ELAVIL) 25 MG tablet Take 1 tablet (25 mg total) by mouth at bedtime.  Marland Kitchen apixaban (ELIQUIS) 5 MG TABS tablet  Take 1 tablet (5 mg total) by mouth 2 (two) times daily.  . Cyanocobalamin (B-12) 500 MCG TABS Take 500 mcg by mouth daily.  . Ferrous Sulfate (IRON PO) Take by mouth.  . fexofenadine (ALLEGRA) 180 MG tablet Take 90 mg by mouth daily as needed for allergies (for allergies or skin itching).   . metoprolol tartrate (LOPRESSOR) 50 MG tablet TAKE ONE-HALF TABLET BY  MOUTH DAILY  . Multiple Vitamin (MULTIVITAMIN) tablet Take 1 tablet daily by mouth.  Marland Kitchen oxycodone (OXY-IR) 5 MG capsule Take 5 mg by mouth every 4 (four) hours as needed.  . sildenafil (VIAGRA) 100 MG tablet TAKE 1/2 TO 1  (100 MG DOSE) BY MOUTH DAILY AS NEEDED FOR ERECTILE DYSFUNCTION.  . tizanidine (ZANAFLEX) 2 MG capsule Take 1 capsule (2 mg total) by mouth at bedtime as needed for muscle spasms.  Marland Kitchen zolpidem (AMBIEN) 10 MG tablet Take 10 mg by mouth at bedtime.      Allergies:   Patient has no known allergies.   Social History   Tobacco Use  . Smoking status: Never Smoker  . Smokeless tobacco: Never Used  Substance Use Topics  . Alcohol use: No  . Drug use: No     Family Hx: The patient's family history includes Arrhythmia in his father; Breast cancer in his sister; Cancer in his mother; Heart attack in his mother. There is no history of Colon cancer, Esophageal cancer, Stomach cancer, or Rectal cancer.  ROS:   Please see the history of present illness.     All other systems reviewed and are negative.   Prior CV studies:   The following studies were reviewed today:  Echo 02/2018: Study Conclusions  - Left ventricle: The cavity size was normal. There was mild   concentric hypertrophy. Systolic function was normal. The   estimated ejection fraction was in the range of 60% to 65%. Wall   motion was normal; there were no regional wall motion   abnormalities. - Aortic valve: Transvalvular velocity was within the normal range.   There was no stenosis. There was no regurgitation. - Aorta: Ascending aortic diameter:  37.54 mm (S). - Ascending aorta: The ascending aorta was mildly dilated. - Mitral valve: Transvalvular velocity was within the normal range.   There was no evidence for stenosis. There was mild to moderate   regurgitation. Planimetered valve area: 4.12 cm^2. - Left atrium: The atrium was severely dilated. - Right ventricle: The cavity size was normal. Wall thickness was   normal. Systolic function was normal. - Right atrium: The atrium was severely dilated. - Tricuspid valve: There was mild-moderate regurgitation. - Pulmonary arteries: Systolic pressure was within the normal   range. PA peak pressure: 35 mm Hg (S).   Labs/Other Tests and Data Reviewed:    EKG:  No ECG reviewed.  Recent Labs: No results found for requested labs within last 8760 hours.   Recent  Lipid Panel Lab Results  Component Value Date/Time   CHOL 166 03/03/2011 09:22 AM   TRIG 41.0 03/03/2011 09:22 AM   HDL 55.80 03/03/2011 09:22 AM   CHOLHDL 3 03/03/2011 09:22 AM   LDLCALC 102 (H) 03/03/2011 09:22 AM    04/30/2018: Total cholesterol 132, triglycerides 64, HDL 54, LDL 73 Sodium 140, BUN 28, creatinine 1.3 AST 18, ALT 15 TSH 1.37  07/08/2018: Hemoglobin 12.2  Wt Readings from Last 3 Encounters:  07/13/18 179 lb (81.2 kg)  06/07/18 175 lb (79.4 kg)  01/07/18 180 lb (81.6 kg)     Objective:    Ht 6\' 3"  (1.905 m)   Wt 179 lb (81.2 kg)   BMI 22.37 kg/m  GENERAL: Well-appearing.  No acute distress. HEENT: Pupils equal round.  Oral mucosa unremarkable NECK:  No jugular venous distention, no visible thyromegaly EXT:  No edema, no cyanosis no clubbing SKIN:  No rashes no nodules NEURO:  Speech fluent.  Cranial nerves grossly intact.  Moves all 4 extremities freely PSYCH:  Cognitively intact, oriented to person place and time   ASSESSMENT & PLAN:    # Persistent atrial fibrillation: Rates remain well-controlled and he is asymptomatic.  He was noted to have iron deficiency anemia without overt  bleeding.  He will undergo a colonoscopy soon.  Continue Eliquis for now.  Okay to hold prior to his procedure.  Continue Eliquis and metoprolol. This patients CHA2DS2-VASc Score and unadjusted Ischemic Stroke Rate (% per year) is equal to 0.6 % stroke rate/year from a score of 1  Above score calculated as 1 point each if present CHF, HTN, DM, Vascular=MI/PAD/Aortic Plaque, Age if 20-74, or Male Above score calculated as 2 points each if present age > 67, or Stroke/TIA/TE  # Mitral regurgitation: # Tricuspid regurgitation: Mild to moderate on echo 02/2018.  It was moderate in 2011.  No evidence of heart failure by history or exam.  #Mild ascending aortic aneurysm: 3.7 cm on 02/2018.  Will repeat on 06/2019.   COVID-19 Education: The signs and symptoms of COVID-19 were discussed with the patient and how to seek care for testing (follow up with PCP or arrange E-visit).  The importance of social distancing was discussed today.  Time:   Today, I have spent 15 minutes with the patient with telehealth technology discussing the above problems.     Medication Adjustments/Labs and Tests Ordered: Current medicines are reviewed at length with the patient today.  Concerns regarding medicines are outlined above.   Tests Ordered: Orders Placed This Encounter  Procedures  . ECHOCARDIOGRAM COMPLETE    Medication Changes: No orders of the defined types were placed in this encounter.   Disposition:  Follow up in 1 year(s)  Signed, [name], MD  07/13/2018 8:55 AM    [place]

## 2018-07-13 NOTE — Patient Instructions (Addendum)
Medication Instructions:  Your physician recommends that you continue on your current medications as directed. Please refer to the Current Medication list given to you today.  If you need a refill on your cardiac medications before your next appointment, please call your pharmacy.   Lab work: NONE   Testing/Procedures: Your physician has requested that you have an echocardiogram. Echocardiography is a painless test that uses sound waves to create images of your heart. It provides your doctor with information about the size and shape of your heart and how well your heart's chambers and valves are working. This procedure takes approximately one hour. There are no restrictions for this procedure. [address] TO BE DONE IN MAY 2021 THE OFFICE WILL CALL YOU WITH DATE AND TIME  Follow-Up: At [facility], you and your health needs are our priority.  As part of our continuing mission to provide you with exceptional heart care, we have created designated Provider Care Teams.  These Care Teams include your primary Cardiologist (physician) and Advanced Practice Providers (APPs -  Physician Assistants and Nurse Practitioners) who all work together to provide you with the care you need, when you need it. You will need a follow up appointment in 12 months.  Please call our office 2 months in advance to schedule this appointment.  You may see [name], MD or one of the following Advanced Practice Providers on your designated Care Team:   Kerin Ransom, PA-C Roby Lofts, [place] . Sande Rives, PA-C

## 2018-07-14 ENCOUNTER — Other Ambulatory Visit: Payer: Self-pay | Admitting: *Deleted

## 2018-07-14 DIAGNOSIS — I4819 Other persistent atrial fibrillation: Secondary | ICD-10-CM

## 2018-07-14 DIAGNOSIS — I340 Nonrheumatic mitral (valve) insufficiency: Secondary | ICD-10-CM

## 2018-07-14 NOTE — Addendum Note (Signed)
Addended by: Alvina Filbert B on: 07/14/2018 02:37 PM   Modules accepted: Orders

## 2018-07-14 NOTE — Progress Notes (Signed)
Patient needs follow up Echo may 2021

## 2018-07-21 ENCOUNTER — Telehealth: Payer: Medicare Other | Admitting: Neurology

## 2018-07-26 DIAGNOSIS — N5201 Erectile dysfunction due to arterial insufficiency: Secondary | ICD-10-CM | POA: Diagnosis not present

## 2018-07-26 DIAGNOSIS — R972 Elevated prostate specific antigen [PSA]: Secondary | ICD-10-CM | POA: Diagnosis not present

## 2018-07-27 ENCOUNTER — Other Ambulatory Visit: Payer: Medicare Other

## 2018-07-27 ENCOUNTER — Encounter: Payer: Self-pay | Admitting: Neurology

## 2018-07-27 ENCOUNTER — Ambulatory Visit (INDEPENDENT_AMBULATORY_CARE_PROVIDER_SITE_OTHER): Payer: Medicare Other | Admitting: Neurology

## 2018-07-27 ENCOUNTER — Other Ambulatory Visit: Payer: Self-pay

## 2018-07-27 VITALS — BP 104/68 | HR 82 | Ht 75.0 in | Wt 182.0 lb

## 2018-07-27 DIAGNOSIS — R202 Paresthesia of skin: Secondary | ICD-10-CM

## 2018-07-27 DIAGNOSIS — G629 Polyneuropathy, unspecified: Secondary | ICD-10-CM

## 2018-07-27 DIAGNOSIS — M5416 Radiculopathy, lumbar region: Secondary | ICD-10-CM | POA: Diagnosis not present

## 2018-07-27 DIAGNOSIS — M5417 Radiculopathy, lumbosacral region: Secondary | ICD-10-CM

## 2018-07-27 NOTE — Progress Notes (Signed)
Follow-up Visit   Date: 07/27/18   Raymond Ray MRN: [id number] DOB: 28-Jun-1950   Interim History: Raymond Ray is a 68 y.o. right-handed male with atrial fibrillation returning to the clinic for follow-up of chronic low back pain and bilateral feet numbness.  The patient was accompanied to the clinic by self.  Over the past few years, he has developed numbness and burning pain over the soles of the feet.  He denies weakness or imbalance.  He walks unassisted and has not suffered any falls.  He also complains of achy low back pain for many years.  He has been working with his Physiological scientist for low back strengthening.  At his last visit, he was offered tizanidine 2 mg which provided no benefit.  His amitriptyline was also increased to 25 mg at bedtime, which she has been on for the past 3 days.  It is too early to notice any significant improvement of pain at this time.  He did not tolerate gabapentin in the past.  He reports having some improvement of pain when he was on oxycodone for his hip.  He has no history of diabetes or heavy alcohol use.  No family history of neuropathy.  Medications:  Current Outpatient Medications on File Prior to Visit  Medication Sig Dispense Refill  . acetaminophen (TYLENOL) 650 MG CR tablet Take 650 mg by mouth every 8 (eight) hours as needed for pain.    Marland Kitchen amitriptyline (ELAVIL) 25 MG tablet Take 1 tablet (25 mg total) by mouth at bedtime. 90 tablet 3  . apixaban (ELIQUIS) 5 MG TABS tablet Take 1 tablet (5 mg total) by mouth 2 (two) times daily. 180 tablet 1  . Cyanocobalamin (B-12) 500 MCG TABS Take 500 mcg by mouth daily.    . fexofenadine (ALLEGRA) 180 MG tablet Take 90 mg by mouth daily as needed for allergies (for allergies or skin itching).     . metoprolol tartrate (LOPRESSOR) 50 MG tablet TAKE ONE-HALF TABLET BY  MOUTH DAILY 45 tablet 1  . Multiple Vitamin (MULTIVITAMIN) tablet Take 1 tablet daily by mouth.    Marland Kitchen oxycodone (OXY-IR) 5 MG capsule  Take 5 mg by mouth every 4 (four) hours as needed.    . sildenafil (VIAGRA) 100 MG tablet TAKE 1/2 TO 1  (100 MG DOSE) BY MOUTH DAILY AS NEEDED FOR ERECTILE DYSFUNCTION. 10 tablet 5  . zolpidem (AMBIEN) 10 MG tablet Take 10 mg by mouth at bedtime.  30 tablet 0   No current facility-administered medications on file prior to visit.     Allergies: No Known Allergies  Review of Systems:  CONSTITUTIONAL: No fevers, chills, night sweats, or weight loss.  EYES: No visual changes or eye pain ENT: No hearing changes.  No history of nose bleeds.   RESPIRATORY: No cough, wheezing and shortness of breath.   CARDIOVASCULAR: Negative for chest pain, and palpitations.   GI: Negative for abdominal discomfort, blood in stools or black stools.  No recent change in bowel habits.   GU:  No history of incontinence.   MUSCLOSKELETAL: No history of joint pain or swelling.  No myalgias.   SKIN: Negative for lesions, rash, and itching.   ENDOCRINE: Negative for cold or heat intolerance, polydipsia or goiter.   PSYCH:  no depression or anxiety symptoms.   NEURO: As Above.   Vital Signs:  BP 104/68   Pulse 82   Ht 6' 3"  (1.905 m)   Wt 182 lb (82.6 kg)  SpO2 98%   BMI 22.75 kg/m    General Medical Exam:   General:  Well appearing, comfortable   Neurological Exam: MENTAL STATUS including orientation to time, place, person, recent and remote memory, attention span and concentration, language, and fund of knowledge is normal.  Speech is not dysarthric.  CRANIAL NERVES:  No visual field defects.  Pupils equal round and reactive to light.  Normal conjugate, extra-ocular eye movements in all directions of gaze.  No ptosis.  Face is symmetric. Palate elevates symmetrically.  Tongue is midline.  MOTOR:  Motor strength is 5/5 in all extremities.  No atrophy, fasciculations or abnormal movements.  No pronator drift.  Tone is normal.    MSRs: Reflexes are 2+/4 throughout except 1+4 bilateral ankles.  SENSORY:   Reduced temperature distally at the toes, otherwise intact vibration throughout.  Rhomberg sign is negative.   COORDINATION/GAIT:  Normal finger-to- nose-finger.  Intact rapid alternating movements bilaterally.  Gait narrow based and stable.  Stressed and tandem gait intact.   Data: NCS/EMG of the legs 07/27/2018:   1. Chronic sensorimotor axonal polyneuropathy affecting the lower extremities, moderate in degree electrically. 2. Superimposed L3-5 radiculopathy affecting bilateral lower extremities, moderate in degree electrically.  IMPRESSION/PLAN: 1.  Idiopathic peripheral neuropathy manifesting with numbness and painful paresthesias in the soles of the feet. I had extensive discussion with the patient regarding the pathogenesis, etiology, management, and natural course of neuropathy. Neuropathy tends to be slowly progressive, especially if a treatable etiology is not identified.  He has had extensive laboratory testing which has been normal.  I will check ESR and copper to be complete, although my overall suspicion that his neuropathy is inflammatory or due to nutrient deficiency is low.  I discussed that in the vast majority of cases, despite checking for reversible causes, we are unable to find the underlying etiology and management is symptomatic.  He has previously been on gabapentin which he did not tolerate. Continue amitriptyline 4m at bedtime.  Consider Lyrica going forward.    2.  Lumbosacral radiculopathy affecting L3-5 nerve roots.  No benefit with exercises or muscle relaxants.  I will obtain MRI lumbar spine to better evaluate for nerve impingement.   Return to clinic in 3 months  Greater than 50% of this 30 minute visit was spent in counseling, explanation of diagnosis, planning of further management, and coordination of care.   Thank you for allowing me to participate in patient's care.  If I can answer any additional questions, I would be pleased to do so.    Sincerely,     Donika K. PPosey Pronto DO

## 2018-07-27 NOTE — Procedures (Signed)
[facility]  [address]  [place], [place] [address] Tel: [phone] Fax:  [phone] Test Date:  07/27/2018  Patient: Raymond Ray DOB: 07-24-1950 Physician: Narda Amber, DO  Sex: Male Height: 6\' 3"  Ref Phys: Narda Amber, DO  ID#: [id number] Temp: 33.0C Technician:    Patient Complaints: This is a 68 year old man referred for evaluation of bilateral feet paresthesias and chronic low back pain.  NCV & EMG Findings: Extensive electrodiagnostic testing of the right lower extremity and additional studies of the left shows:  1. Bilateral superficial peroneal sensory responses were absent.  Bilateral sural sensory responses are low-normal. 2. Bilateral tibial motor responses are within normal limits.  Bilateral peroneal motor responses show reduced amplitude at the extensor digitorum brevis, and is normal at the tibialis anterior. 3. Bilateral tibial H reflex studies show prolonged latencies. 4. Chronic motor axonal loss changes are seen affecting the tibialis anterior, medial gastrocnemius, and rectus femoris muscles bilaterally.  Motor unit recruitment and configuration is normal in bilateral biceps femoris short head muscles.  Proximal and deep muscles was not tested as the patient is on anticoagulation therapy.  Impression: 1. Chronic sensorimotor axonal polyneuropathy affecting the lower extremities, moderate in degree electrically. 2. Superimposed L3-5 radiculopathy affecting bilateral lower extremities, moderate in degree electrically.   ___________________________ Narda Amber, DO    Nerve Conduction Studies Anti Sensory Summary Table   Site NR Peak (ms) Norm Peak (ms) P-T Amp (V) Norm P-T Amp  Left Sup Peroneal Anti Sensory (Ant Lat Mall)  33C  12 cm NR  <4.6  >3  Right Sup Peroneal Anti Sensory (Ant Lat Mall)  33C  12 cm NR  <4.6  >3  Left Sural Anti Sensory (Lat Mall)  33C  Calf    3.8 <4.6 4.3 >3  Right Sural Anti Sensory (Lat Mall)   33C  Calf    3.4 <4.6 3.7 >3   Motor Summary Table   Site NR Onset (ms) Norm Onset (ms) O-P Amp (mV) Norm O-P Amp Site1 Site2 Delta-0 (ms) Dist (cm) Vel (m/s) Norm Vel (m/s)  Left Peroneal Motor (Ext Dig Brev)  33C  Ankle    5.0 <6.0 1.1 >2.5 B Fib Ankle 10.5 42.0 40 >40  B Fib    15.5  1.1  Poplt B Fib 2.2 9.0 41 >40  Poplt    17.7  1.1         Right Peroneal Motor (Ext Dig Brev)  33C  Ankle    3.7 <6.0 1.6 >2.5 B Fib Ankle 9.6 43.0 45 >40  B Fib    13.3  1.5  Poplt B Fib 2.0 9.0 45 >40  Poplt    15.3  1.5         Left Peroneal TA Motor (Tib Ant)  33C  Fib Head    2.9 <4.5 3.1 >3 Poplit Fib Head 1.8 9.0 50 >40  Poplit    4.7  3.1         Right Peroneal TA Motor (Tib Ant)  33C  Fib Head    3.0 <4.5 4.1 >3 Poplit Fib Head 1.6 9.0 56 >40  Poplit    4.6  4.0         Left Tibial Motor (Abd Hall Brev)  33C  Ankle    4.0 <6.0 5.4 >4 Knee Ankle 11.3 45.0 40 >40  Knee    15.3  3.5         Right Tibial Motor (Abd  Hall Brev)  33C  Ankle    3.4 <6.0 5.6 >4 Knee Ankle 11.1 44.0 40 >40  Knee    14.5  3.3          H Reflex Studies   NR H-Lat (ms) Lat Norm (ms) L-R H-Lat (ms)  Left Tibial (Gastroc)  33C     37.96 <35 0.00  Right Tibial (Gastroc)  33C     37.96 <35 0.00   EMG   Side Muscle Ins Act Fibs Psw Fasc Number Recrt Dur Dur. Amp Amp. Poly Poly. Comment  Right AntTibialis Nml Nml Nml Nml 1- Rapid Most 1+ Many 1+ Nml Nml N/A  Right Gastroc Nml Nml Nml Nml 2- Rapid Some 1+ Some 1+ Nml Nml N/A  Right RectFemoris Nml Nml Nml Nml 1- Rapid Some 1+ Some 1+ Nml Nml N/A  Right BicepsFemS Nml Nml Nml Nml Nml Nml Nml Nml Nml Nml Nml Nml N/A  Left BicepsFemS Nml Nml Nml Nml Nml Nml Nml Nml Nml Nml Nml Nml N/A  Left AntTibialis Nml Nml Nml Nml 1- Rapid Most 1+ Many 1+ Nml Nml N/A  Left Gastroc Nml Nml Nml Nml 2- Rapid Some 1+ Some 1+ Nml Nml N/A  Left RectFemoris Nml Nml Nml Nml 1- Rapid Some 1+ Some 1+ Nml Nml N/A      Waveforms:

## 2018-07-27 NOTE — Patient Instructions (Addendum)
Check labs and MRI lumbar spine.  We will call you with the results  Continue amitriptyline 25mg  at bedtime  Return to clinic in 3 months

## 2018-07-29 LAB — SEDIMENTATION RATE: Sed Rate: 6 mm/h (ref 0–20)

## 2018-07-29 LAB — COPPER, SERUM: Copper: 86 ug/dL (ref 70–175)

## 2018-07-30 ENCOUNTER — Ambulatory Visit: Payer: Medicare Other | Admitting: Neurology

## 2018-08-18 ENCOUNTER — Encounter: Payer: Self-pay | Admitting: Nurse Practitioner

## 2018-08-31 ENCOUNTER — Telehealth: Payer: Self-pay

## 2018-08-31 NOTE — Telephone Encounter (Signed)
Patient called back and answered no to all questions.

## 2018-08-31 NOTE — Telephone Encounter (Signed)
Covid-19 screening questions   Do you now or have you had a fever in the last 14 days?  Do you have any respiratory symptoms of shortness of breath or cough now or in the last 14 days?  Do you have any family members or close contacts with diagnosed or suspected Covid-19 in the past 14 days?  Have you been tested for Covid-19 and found to be positive?    Called and left voicemail for patient to call back to answer

## 2018-09-01 ENCOUNTER — Encounter: Payer: Self-pay | Admitting: Nurse Practitioner

## 2018-09-01 ENCOUNTER — Telehealth: Payer: Self-pay

## 2018-09-01 ENCOUNTER — Ambulatory Visit (INDEPENDENT_AMBULATORY_CARE_PROVIDER_SITE_OTHER): Payer: Medicare Other | Admitting: Nurse Practitioner

## 2018-09-01 ENCOUNTER — Other Ambulatory Visit: Payer: Self-pay

## 2018-09-01 VITALS — BP 100/70 | HR 88 | Temp 97.8°F | Ht 75.0 in | Wt 179.2 lb

## 2018-09-01 DIAGNOSIS — R195 Other fecal abnormalities: Secondary | ICD-10-CM | POA: Diagnosis not present

## 2018-09-01 DIAGNOSIS — Z7901 Long term (current) use of anticoagulants: Secondary | ICD-10-CM | POA: Diagnosis not present

## 2018-09-01 MED ORDER — NA SULFATE-K SULFATE-MG SULF 17.5-3.13-1.6 GM/177ML PO SOLN: ORAL | 0 refills | Status: DC

## 2018-09-01 NOTE — Telephone Encounter (Signed)
Called patient about stopping Eliquis and told him that since the clearance was cleared for 1-2 days, 2 days is the rule of thumb and since he is have procedure on 7/14 be will stop taking it on 7/12 so 7/11 will be his last dose and patient verbalized understanding

## 2018-09-01 NOTE — Progress Notes (Signed)
Chief Complaint:   Hemoccult positive stool on Eliquis  IMPRESSION and PLAN:    42. 68 year-old male with Hemoccult positive stool on Eliquis.  He was evaluated by [place] the end of March, was supposed to be scheduled for colonoscopy but this was postponed due to COVID-19 pandemic.  Patient has returned to proceed with work-up.  Hemoglobin around 12 in May, ferritin in the 400s at the time so doubt iron deficiency. -We will proceed with scheduling the colonoscopy as the patient and Dr. Tarri Glenn previously discussed. The risks and benefits of colonoscopy with possible polypectomy / biopsies were discussed and the patient agrees to proceed.  -I thought about but did not elect to schedule patient for an upper endoscopy for evaluation of what sounds like guaiac positive stool studies which could be from upper or lower GI source.   2.  Atrial fibrillation, on chronic Eliquis.  Hold Eliquis for 2 days before procedure - will instruct when and how to resume after procedure. Patient understands that there is a low but real risk of cardiovascular event such as heart attack, stroke, or embolism /  thrombosis while off blood thinner. The patient consents to proceed. Will communicate by phone or EMR with patient's prescribing provider to confirm that holding Eliquis is reasonable in this case.  This clearance may have already been obtained since patient was to have the colonoscopy back in March    HPI:     Patient is a 68 year old male with atrial fibrillation on chronic Coumadin.  Previously followed by Dr. Deatra Ina with whom he had a normal colonoscopy in 2014.  Patient established care via telehealth visit with Dr. Tarri Glenn 05/25/2018.  Reason for that visit was for evaluation of Hemoccult positive stool on Eliquis.  Plan was to hold Eliquis for 2 days and proceed with colonoscopy.  Procedure was postponed due to COVID-19 pandemic.  Patient is here now to get rescheduled.  He has no GI complaints.   Specifically, no abdominal pain, bowel changes, nausea/vomiting, unexplained weight loss or overt GI blood loss.  No general medical complaints  Data Reviewed:  Labs drawn by PCP 07/08/2018 Hemoglobin 12.4, MCV 109.9, platelets 249, ferritin 432,  Review of systems:     No chest pain, no SOB, no fevers, no urinary sx   Past Medical History:  Diagnosis Date  . Allergy   . Chronic atrial fibrillation   . History of colonoscopy 04/2001   negative  . Mitral valve prolapse   . Osteoarthritis of hip   . PONV (postoperative nausea and vomiting)    left knee cartilage removal;    . Vertigo 01/07/2017   currently not symptomatic     Patient's surgical history, family medical history, social history, medications and allergies were all reviewed in Epic   Creatinine clearance cannot be calculated (Patient's most recent lab result is older than the maximum 21 days allowed.)  Current Outpatient Medications  Medication Sig Dispense Refill  . acetaminophen (TYLENOL) 500 MG tablet Take 500 mg by mouth as needed.    Marland Kitchen apixaban (ELIQUIS) 5 MG TABS tablet Take 1 tablet (5 mg total) by mouth 2 (two) times daily. 180 tablet 1  . Cyanocobalamin (B-12) 500 MCG TABS Take 500 mcg by mouth daily.    . fexofenadine (ALLEGRA) 180 MG tablet Take 90 mg by mouth as needed for allergies (for allergies or skin itching).     . metoprolol tartrate (LOPRESSOR) 50 MG tablet TAKE ONE-HALF TABLET BY  MOUTH DAILY 45 tablet 1  . Multiple Vitamin (MULTIVITAMIN) tablet Take 1 tablet daily by mouth.    . oxyCODONE-acetaminophen (PERCOCET) 10-325 MG tablet 1 tablet as needed.    . sildenafil (VIAGRA) 100 MG tablet TAKE 1/2 TO 1  (100 MG DOSE) BY MOUTH DAILY AS NEEDED FOR ERECTILE DYSFUNCTION. 10 tablet 5  . zolpidem (AMBIEN) 10 MG tablet Take 10 mg by mouth at bedtime.  30 tablet 0  . amitriptyline (ELAVIL) 25 MG tablet Take 1 tablet (25 mg total) by mouth at bedtime. (Patient not taking: Reported on 09/01/2018) 90 tablet 3  .  Na Sulfate-K Sulfate-Mg Sulf 17.5-3.13-1.6 GM/177ML SOLN Suprep-Use as directed 354 mL 0   No current facility-administered medications for this visit.     Physical Exam:     BP 100/70   Pulse 88   Temp 97.8 F (36.6 C)   Ht 6\' 3"  (1.905 m)   Wt 179 lb 4 oz (81.3 kg)   BMI 22.40 kg/m   GENERAL:  Pleasant male in NAD PSYCH: : Cooperative, normal affect EENT:  conjunctiva pink, mucous membranes moist, neck supple without masses CARDIAC:  Reg rate, irreg rhythm, no murmur heard, no peripheral edema PULM: Normal respiratory effort, lungs CTA bilaterally, no wheezing ABDOMEN:  Nondistended, soft, nontender. No obvious masses, no hepatomegaly,  normal bowel sounds SKIN:  turgor, no lesions seen Musculoskeletal:  Normal muscle tone, normal strength NEURO: Alert and oriented x 3, no focal neurologic deficits   Tye Savoy , NP 09/01/2018, 5:21 PM

## 2018-09-01 NOTE — Progress Notes (Signed)
Reviewed. I agree with documentation including the assessment and plan.  Hailyn Zarr L. Ashleynicole Mcclees, MD, MPH 

## 2018-09-01 NOTE — Patient Instructions (Signed)
If you are age 67 or older, your body mass index should be between 23-30. Your Body mass index is 22.4 kg/m. If this is out of the aforementioned range listed, please consider follow up with your Primary Care Provider.  If you are age 77 or younger, your body mass index should be between 19-25. Your Body mass index is 22.4 kg/m. If this is out of the aformentioned range listed, please consider follow up with your Primary Care Provider.   You have been scheduled for a colonoscopy. Please follow written instructions given to you at your visit today.  Please pick up your prep supplies at the pharmacy within the next 1-3 days. If you use inhalers (even only as needed), please bring them with you on the day of your procedure. Your physician has requested that you go to www.startemmi.com and enter the access code given to you at your visit today. This web site gives a general overview about your procedure. However, you should still follow specific instructions given to you by our office regarding your preparation for the procedure.  We have sent the following medications to your pharmacy for you to pick up at your convenience: [place] will be contacted by our office prior to your procedure for directions on holding your Eliquis.  If you do not hear from our office 1 week prior to your scheduled procedure, please call [phone] to discuss.   Thank you for choosing me and [facility].   Tye Savoy, NP

## 2018-09-06 ENCOUNTER — Telehealth: Payer: Self-pay | Admitting: Gastroenterology

## 2018-09-06 NOTE — Telephone Encounter (Signed)

## 2018-09-06 NOTE — Telephone Encounter (Signed)
Pt responded "no" to all screening questions °

## 2018-09-07 ENCOUNTER — Ambulatory Visit (AMBULATORY_SURGERY_CENTER): Payer: Medicare Other | Admitting: Gastroenterology

## 2018-09-07 ENCOUNTER — Encounter: Payer: Self-pay | Admitting: Gastroenterology

## 2018-09-07 ENCOUNTER — Other Ambulatory Visit: Payer: Self-pay

## 2018-09-07 VITALS — BP 105/70 | HR 74 | Temp 98.9°F | Resp 13 | Ht 75.0 in | Wt 179.0 lb

## 2018-09-07 DIAGNOSIS — K5730 Diverticulosis of large intestine without perforation or abscess without bleeding: Secondary | ICD-10-CM

## 2018-09-07 DIAGNOSIS — D123 Benign neoplasm of transverse colon: Secondary | ICD-10-CM | POA: Diagnosis not present

## 2018-09-07 DIAGNOSIS — K648 Other hemorrhoids: Secondary | ICD-10-CM

## 2018-09-07 DIAGNOSIS — R195 Other fecal abnormalities: Secondary | ICD-10-CM | POA: Diagnosis not present

## 2018-09-07 MED ORDER — SODIUM CHLORIDE 0.9 % IV SOLN: 500.0000 mL | Freq: Once | INTRAVENOUS | Status: DC

## 2018-09-07 NOTE — Patient Instructions (Signed)
Thank you for allowing [place] to participate in your care today!  Await pathology results by mail, approximately 2 weeks.  Will make recommendations at that time for next colonoscopy.  Resume previous diet and medications today.   Return to normal activities tomorrow.  Resume Eliquis today at prior dose.  Recommend a bulking agent such as Metamucil for your hemorrhoids.  Drink plenty of fluids with these products.     YOU HAD AN ENDOSCOPIC PROCEDURE TODAY AT [facility]:   Refer to the procedure report that was given to you for any specific questions about what was found during the examination.  If the procedure report does not answer your questions, please call your gastroenterologist to clarify.  If you requested that your care partner not be given the details of your procedure findings, then the procedure report has been included in a sealed envelope for you to review at your convenience later.  YOU SHOULD EXPECT: Some feelings of bloating in the abdomen. Passage of more gas than usual.  Walking can help get rid of the air that was put into your GI tract during the procedure and reduce the bloating. If you had a lower endoscopy (such as a colonoscopy or flexible sigmoidoscopy) you may notice spotting of blood in your stool or on the toilet paper. If you underwent a bowel prep for your procedure, you may not have a normal bowel movement for a few days.  Please Note:  You might notice some irritation and congestion in your nose or some drainage.  This is from the oxygen used during your procedure.  There is no need for concern and it should clear up in a day or so.  SYMPTOMS TO REPORT IMMEDIATELY:   Following lower endoscopy (colonoscopy or flexible sigmoidoscopy):  Excessive amounts of blood in the stool  Significant tenderness or worsening of abdominal pains  Swelling of the abdomen that is new, acute  Fever of 100F or higher    For urgent or emergent issues, a  gastroenterologist can be reached at any hour by calling [phone].   DIET:  We do recommend a small meal at first, but then you may proceed to your regular diet.  Drink plenty of fluids but you should avoid alcoholic beverages for 24 hours.  ACTIVITY:  You should plan to take it easy for the rest of today and you should NOT DRIVE or use heavy machinery until tomorrow (because of the sedation medicines used during the test).    FOLLOW UP: Our staff will call the number listed on your records 48-72 hours following your procedure to check on you and address any questions or concerns that you may have regarding the information given to you following your procedure. If we do not reach you, we will leave a message.  We will attempt to reach you two times.  During this call, we will ask if you have developed any symptoms of COVID 19. If you develop any symptoms (ie: fever, flu-like symptoms, shortness of breath, cough etc.) before then, please call [phone].  If you test positive for Covid 19 in the 2 weeks post procedure, please call and report this information to [place].    If any biopsies were taken you will be contacted by phone or by letter within the next 1-3 weeks.  Please call us at [phone] if you have not heard about the biopsies in 3 weeks.    SIGNATURES/CONFIDENTIALITY: You and/or your care partner have signed paperwork  which will be entered into your electronic medical record.  These signatures attest to the fact that that the information above on your After Visit Summary has been reviewed and is understood.  Full responsibility of the confidentiality of this discharge information lies with you and/or your care-partner.

## 2018-09-07 NOTE — Progress Notes (Signed)
PT taken to PACU. Monitors in place. VSS. Report given to RN. 

## 2018-09-07 NOTE — Progress Notes (Signed)
Called to room to assist during endoscopic procedure.  Patient ID and intended procedure confirmed with present staff. Received instructions for my participation in the procedure from the performing physician.  

## 2018-09-07 NOTE — Op Note (Signed)
[place] Patient Name: Raymond Ray Procedure Date: 09/07/2018 2:43 PM MRN: [id number] Endoscopist: Thornton Park MD, MD Age: 68 Referring MD:  Date of Birth: 1950/11/24 Gender: Male Account #: [id number] Procedure:                Colonoscopy Indications:              Hemoccult positive without associated iron                            deficiency or overt GI blood loss                           - 05/03/2018: iron 107, ferritin 478.5                           - 05/11/2018: Hemoccult positive                           - No hemoglobin or hematocrit included in referral                            records                           Atrial fibrillation on Eliquis                           Internal hemorrhoids                           Normal screening colonoscopy with Dr. Deatra Ina                            11/15/2012                           No known family history of colon cancer or polyps Medicines:                See the Anesthesia note for documentation of the                            administered medications Procedure:                Pre-Anesthesia Assessment:                           - Prior to the procedure, a History and Physical                            was performed, and patient medications and                            allergies were reviewed. The patient's tolerance of                            previous anesthesia was also reviewed. The risks  and benefits of the procedure and the sedation                            options and risks were discussed with the patient.                            All questions were answered, and informed consent                            was obtained. Prior Anticoagulants: The patient has                            taken Eliquis (apixaban), last dose was 3 days                            prior to procedure. ASA Grade Assessment: III - A                            patient with severe systemic disease.  After                            reviewing the risks and benefits, the patient was                            deemed in satisfactory condition to undergo the                            procedure.                           After obtaining informed consent, the colonoscope                            was passed under direct vision. Throughout the                            procedure, the patient's blood pressure, pulse, and                            oxygen saturations were monitored continuously. The                            Model CF-HQ190L [id number]) scope was introduced                            through the anus and advanced to the the terminal                            ileum, with identification of the appendiceal                            orifice and IC valve. The colonoscopy was performed  without difficulty. The patient tolerated the                            procedure well. The quality of the bowel                            preparation was good. The terminal ileum, ileocecal                            valve, appendiceal orifice, and rectum were                            photographed. Scope In: 2:55:53 PM Scope Out: 3:13:43 PM Scope Withdrawal Time: 0 hours 14 minutes 47 seconds  Total Procedure Duration: 0 hours 17 minutes 50 seconds  Findings:                 The perianal and digital rectal examinations were                            normal.                           A few small-mouthed diverticula were found in the                            sigmoid colon and descending colon.                           A 3 mm polyp was found in the transverse colon. The                            polyp was sessile. The polyp was removed with a                            cold snare. Resection and retrieval were complete.                            Estimated blood loss was minimal.                           Non-bleeding internal hemorrhoids were found. The                             hemorrhoids were large.                           The exam was otherwise without abnormality on                            direct and retroflexion views. Complications:            No immediate complications. Estimated blood loss:                            Minimal. Estimated Blood Loss:  Estimated blood loss was minimal. Impression:               - Diverticulosis in the sigmoid colon and in the                            descending colon.                           - One 3 mm polyp in the transverse colon, removed                            with a cold snare. Resected and retrieved.                           - Non-bleeding internal hemorrhoids.                           - The examination was otherwise normal on direct                            and retroflexion views.                           - His hemorrhoids are likely to be the source of                            his hemoccult positive stools. Recommendation:           - Patient has a contact number available for                            emergencies. The signs and symptoms of potential                            delayed complications were discussed with the                            patient. Return to normal activities tomorrow.                            Written discharge instructions were provided to the                            patient.                           - Resume regular diet today. High fiber diet                            recommended.                           - Resume Eliquis (apixaban) at prior dose today.                           - Await pathology results.                           -  Given the hemorrhoids, drink plenty of fluids and                            use a daily stool bulking agent such as Metamucil.                           - Repeat colonoscopy date to be determined after                            pending pathology results are reviewed for                             surveillance based on pathology results. If the                            polyp is an adenoma, will repeat colonoscopy in 7                            years. Thornton Park MD, MD 09/07/2018 3:22:19 PM This report has been signed electronically.

## 2018-09-07 NOTE — Progress Notes (Signed)
Pt's states no medical or surgical changes since previsit or office visit. Covid- Nancy Vitals- Courtney 

## 2018-09-09 ENCOUNTER — Telehealth: Payer: Self-pay

## 2018-09-09 NOTE — Telephone Encounter (Signed)
Left message on follow up call. 

## 2018-09-09 NOTE — Telephone Encounter (Signed)
  Follow up Call-  Call back number 09/07/2018  Post procedure Call Back phone  # [phone]  Permission to leave phone message Yes  Some recent data might be hidden     Patient questions:  Do you have a fever, pain , or abdominal swelling? No. Pain Score  0 *  Have you tolerated food without any problems? Yes.    Have you been able to return to your normal activities? Yes.    Do you have any questions about your discharge instructions: Diet   No. Medications  No. Follow up visit  No.  Do you have questions or concerns about your Care? No.  Actions: * If pain score is 4 or above: No action needed, pain <4. 1. Have you developed a fever since your procedure? no  2.   Have you had an respiratory symptoms (SOB or cough) since your procedure? no  3.   Have you tested positive for COVID 19 since your procedure no  4.   Have you had any family members/close contacts diagnosed with the COVID 19 since your procedure?  no   If yes to any of these questions please route to Joylene John, RN and Alphonsa Gin, Therapist, sports.

## 2018-09-12 ENCOUNTER — Encounter: Payer: Self-pay | Admitting: Gastroenterology

## 2018-09-22 ENCOUNTER — Other Ambulatory Visit: Payer: Self-pay | Admitting: Cardiovascular Disease

## 2018-10-03 ENCOUNTER — Other Ambulatory Visit: Payer: Self-pay | Admitting: Cardiovascular Disease

## 2018-10-04 NOTE — Telephone Encounter (Signed)
35m 81.3kg Scr 1.3 04/30/18 [facility] 07/13/18

## 2018-10-04 NOTE — Telephone Encounter (Signed)
Refill Request.  

## 2018-11-23 DIAGNOSIS — R972 Elevated prostate specific antigen [PSA]: Secondary | ICD-10-CM | POA: Diagnosis not present

## 2018-11-26 DIAGNOSIS — Z23 Encounter for immunization: Secondary | ICD-10-CM | POA: Diagnosis not present

## 2019-01-17 DIAGNOSIS — R972 Elevated prostate specific antigen [PSA]: Secondary | ICD-10-CM | POA: Diagnosis not present

## 2019-01-24 DIAGNOSIS — G894 Chronic pain syndrome: Secondary | ICD-10-CM | POA: Diagnosis not present

## 2019-01-24 DIAGNOSIS — G629 Polyneuropathy, unspecified: Secondary | ICD-10-CM | POA: Diagnosis not present

## 2019-01-25 DIAGNOSIS — R972 Elevated prostate specific antigen [PSA]: Secondary | ICD-10-CM | POA: Diagnosis not present

## 2019-01-25 DIAGNOSIS — N5201 Erectile dysfunction due to arterial insufficiency: Secondary | ICD-10-CM | POA: Diagnosis not present

## 2019-02-28 DIAGNOSIS — R972 Elevated prostate specific antigen [PSA]: Secondary | ICD-10-CM | POA: Diagnosis not present

## 2019-02-28 DIAGNOSIS — C61 Malignant neoplasm of prostate: Secondary | ICD-10-CM | POA: Diagnosis not present

## 2019-03-09 DIAGNOSIS — D225 Melanocytic nevi of trunk: Secondary | ICD-10-CM | POA: Diagnosis not present

## 2019-03-09 DIAGNOSIS — L570 Actinic keratosis: Secondary | ICD-10-CM | POA: Diagnosis not present

## 2019-03-09 DIAGNOSIS — L820 Inflamed seborrheic keratosis: Secondary | ICD-10-CM | POA: Diagnosis not present

## 2019-03-09 DIAGNOSIS — D485 Neoplasm of uncertain behavior of skin: Secondary | ICD-10-CM | POA: Diagnosis not present

## 2019-03-09 DIAGNOSIS — L111 Transient acantholytic dermatosis [Grover]: Secondary | ICD-10-CM | POA: Diagnosis not present

## 2019-03-09 DIAGNOSIS — D1801 Hemangioma of skin and subcutaneous tissue: Secondary | ICD-10-CM | POA: Diagnosis not present

## 2019-03-09 DIAGNOSIS — Z85828 Personal history of other malignant neoplasm of skin: Secondary | ICD-10-CM | POA: Diagnosis not present

## 2019-03-09 DIAGNOSIS — D2261 Melanocytic nevi of right upper limb, including shoulder: Secondary | ICD-10-CM | POA: Diagnosis not present

## 2019-03-10 DIAGNOSIS — C61 Malignant neoplasm of prostate: Secondary | ICD-10-CM | POA: Diagnosis not present

## 2019-03-10 DIAGNOSIS — N5201 Erectile dysfunction due to arterial insufficiency: Secondary | ICD-10-CM | POA: Diagnosis not present

## 2019-03-14 ENCOUNTER — Encounter: Payer: Self-pay | Admitting: *Deleted

## 2019-03-17 ENCOUNTER — Other Ambulatory Visit: Payer: Self-pay | Admitting: Cardiovascular Disease

## 2019-03-29 ENCOUNTER — Ambulatory Visit: Payer: Medicare Other

## 2019-04-01 ENCOUNTER — Telehealth: Payer: Self-pay | Admitting: Medical Oncology

## 2019-04-01 NOTE — Telephone Encounter (Signed)
I called pt to introduce myself as the Prostate Nurse Navigator and the Coordinator of the Prostate [place].  1. I confirmed with the patient he is aware of his referral to the clinic 2/12, arriving at 8:00 am. Patient offered telehealth or video visit but requested to come into the office. I discussed the COVID restrictions and screening process.   2. I discussed the format of the clinic and the physicians he will be seeing that day.  3. I discussed where the clinic is located and how to contact me. I discussed valet parking and registration.  4. I confirmed he received the packet of information I mailed earlier this week. I discussed the medical  forms to be completed and asked him to bring them with him the day of his appointment.   He voiced understanding of the above. I asked him to call me if he has any questions or concerns regarding his appointments or the forms he needs to complete.

## 2019-04-03 ENCOUNTER — Ambulatory Visit: Payer: Medicare Other | Attending: Internal Medicine

## 2019-04-03 DIAGNOSIS — Z23 Encounter for immunization: Secondary | ICD-10-CM | POA: Insufficient documentation

## 2019-04-03 NOTE — Progress Notes (Signed)
   Covid-19 Vaccination Clinic  Name:  Raymond Ray    MRN: [id number] DOB: [birth year]  04/03/2019  Raymond Ray was observed post Covid-19 immunization for 15 minutes without incidence. He was provided with Vaccine Information Sheet and instruction to access the V-Safe system.   Raymond Ray was instructed to call 911 with any severe reactions post vaccine: Marland Kitchen Difficulty breathing  . Swelling of your face and throat  . A fast heartbeat  . A bad rash all over your body  . Dizziness and weakness    Immunizations Administered    Name Date Dose VIS Date Route   Pfizer COVID-19 Vaccine 04/03/2019  2:17 PM 0.3 mL 02/04/2019 Intramuscular   Manufacturer: [employer]   Lot: [id number]   [place]: [id number]

## 2019-04-05 ENCOUNTER — Encounter: Payer: Self-pay | Admitting: Medical Oncology

## 2019-04-06 ENCOUNTER — Encounter: Payer: Self-pay | Admitting: Radiation Oncology

## 2019-04-06 NOTE — Progress Notes (Signed)
GU Location of Tumor / Histology: prostatic adenocarcinoma  If Prostate Cancer, Gleason Score is (4 + 3) and PSA is (4.62). Prostate volume: 32.17 gm.   Biopsies of prostate (if applicable) revealed:    Past/Anticipated interventions by urology, if any: prostate biopsy, referral to [facility]  Past/Anticipated interventions by medical oncology, if any: no  Weight changes, if any: no  Bowel/Bladder complaints, if any: Reports nocturia. Denies dysuria or hematuria.  Reports ED with intermittent success with both sildenafil and Cialis.  Nausea/Vomiting, if any: no  Pain issues, if any:  Chronic back pain.  SAFETY ISSUES:  Prior radiation? Denies  Pacemaker/ICD? Denies. Patient does have a history of afib.  Possible current pregnancy? no, male patient  Is the patient on methotrexate? no  Current Complaints / other details:  69 year old male. Married with two daughters. 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 Denies a family hx of cancer. Leaning toward surgery.

## 2019-04-07 ENCOUNTER — Telehealth: Payer: Self-pay | Admitting: Medical Oncology

## 2019-04-07 NOTE — Telephone Encounter (Signed)
Spoke with patient to confirm appointment for [facility] 2/12 arriving at 8 am. I reviewed valet parking, COVID restrictions and registration. I reminded him to bring completed medical forms. He voiced understanding of the above.

## 2019-04-07 NOTE — Progress Notes (Signed)
                               Care Plan Summary  Name: Mr. Raymond Ray DOB: 06/09/1950    Your Medical Team:   Urologist -  Dr. [name], [facility]  Radiation Oncologist - Dr. Tyler Pita, [facility]   Medical Oncologist - Dr. Zola Button, [place]  Recommendations: 1) Robotic prostatectomy 2) Brachytherapy ( seed implant) 3) External beam radiation  * These recommendations are based on information available as of today's consult.      Recommendations may change depending on the results of further tests or exams.  Next Steps: 1) Dr. Lynne Logan office will call you to schedule surgery   When appointments need to be scheduled, you will be contacted by [facility] and/or [facility].  Questions?  Please do not hesitate to call Cira Rue, RN, BSN, OCN at [phone] any questions or concerns.  Shirlean Mylar is your Oncology Nurse Navigator and is available to assist you while you're receiving your medical care at [facility].

## 2019-04-08 ENCOUNTER — Encounter: Payer: Self-pay | Admitting: Medical Oncology

## 2019-04-08 ENCOUNTER — Other Ambulatory Visit: Payer: Self-pay

## 2019-04-08 ENCOUNTER — Encounter: Payer: Self-pay | Admitting: Radiation Oncology

## 2019-04-08 ENCOUNTER — Inpatient Hospital Stay: Payer: Medicare Other | Attending: Oncology | Admitting: Oncology

## 2019-04-08 ENCOUNTER — Ambulatory Visit
Admission: RE | Admit: 2019-04-08 | Discharge: 2019-04-08 | Disposition: A | Discharge status: Home / Self Care | Payer: Medicare Other | Source: Ambulatory Visit | Attending: Radiation Oncology | Admitting: Radiation Oncology

## 2019-04-08 ENCOUNTER — Other Ambulatory Visit: Payer: Self-pay | Admitting: Urology

## 2019-04-08 ENCOUNTER — Encounter: Payer: Self-pay | Admitting: General Practice

## 2019-04-08 VITALS — BP 116/83 | HR 88 | Temp 98.4°F | Resp 18 | Wt 177.2 lb

## 2019-04-08 DIAGNOSIS — C61 Malignant neoplasm of prostate: Secondary | ICD-10-CM | POA: Insufficient documentation

## 2019-04-08 DIAGNOSIS — Z7901 Long term (current) use of anticoagulants: Secondary | ICD-10-CM

## 2019-04-08 DIAGNOSIS — I341 Nonrheumatic mitral (valve) prolapse: Secondary | ICD-10-CM | POA: Diagnosis not present

## 2019-04-08 DIAGNOSIS — Z8249 Family history of ischemic heart disease and other diseases of the circulatory system: Secondary | ICD-10-CM | POA: Insufficient documentation

## 2019-04-08 DIAGNOSIS — I482 Chronic atrial fibrillation, unspecified: Secondary | ICD-10-CM

## 2019-04-08 DIAGNOSIS — Z833 Family history of diabetes mellitus: Secondary | ICD-10-CM

## 2019-04-08 DIAGNOSIS — Z79899 Other long term (current) drug therapy: Secondary | ICD-10-CM | POA: Diagnosis not present

## 2019-04-08 DIAGNOSIS — Z803 Family history of malignant neoplasm of breast: Secondary | ICD-10-CM | POA: Diagnosis not present

## 2019-04-08 DIAGNOSIS — Z791 Long term (current) use of non-steroidal anti-inflammatories (NSAID): Secondary | ICD-10-CM | POA: Insufficient documentation

## 2019-04-08 DIAGNOSIS — R972 Elevated prostate specific antigen [PSA]: Secondary | ICD-10-CM | POA: Diagnosis not present

## 2019-04-08 DIAGNOSIS — Z809 Family history of malignant neoplasm, unspecified: Secondary | ICD-10-CM | POA: Insufficient documentation

## 2019-04-08 HISTORY — DX: Malignant neoplasm of prostate: C61

## 2019-04-08 NOTE — Progress Notes (Signed)
Radiation Oncology         [phone] ________________________________  Multidisciplinary Prostate Cancer Clinic  Initial Radiation Oncology Consultation  Name: Raymond Ray MRN: [id number]  Date: 04/08/2019  DOB: 24-Feb-1951  [id number], Raymond [place], MD  Raymond Hughs, MD   REFERRING PHYSICIAN: Ardis Hughs, MD  DIAGNOSIS: 69 y.o. gentleman with stage T1c adenocarcinoma of the prostate with a Gleason's score of 4+3 and a PSA of 4.62    ICD-10-CM   1. Malignant neoplasm of prostate ([place])  C61     HISTORY OF PRESENT ILLNESS::Raymond Ray is a 69 y.o. gentleman.  He was noted to have an elevated PSA of 4.93 on 05/05/2018 by his primary care physician, Dr. Joylene Ray.  Accordingly, he was referred for evaluation in urology by Dr. Louis Ray on 05/20/2018,  digital rectal examination was performed at that time revealing no nodules.  Repeat PSA that day was down to 4.8. They opted for repeat PSA in 3 months which was decreased slightly at 4.13 on 07/19/18.  When he returned for follow up on 01/25/2019, his PSA from the week prior had increased slightly to 4.62.  The patient proceeded to transrectal ultrasound with 12 biopsies of the prostate on 02/28/2019.  The prostate volume measured 32.17 cc.  Out of 12 core biopsies, 5 were positive.  The maximum Gleason score was 4+3, and this was seen in the left base. Additionally, Gleason 3+3 was seen in the left base lateral, left mid (two small foci), right base (small focus), and right mid lateral (small focus).  The patient reviewed the biopsy results with his urologist and he has kindly been referred today to the multidisciplinary prostate cancer clinic for presentation of pathology and radiology studies in our conference for discussion of potential radiation treatment options and clinical evaluation.  PREVIOUS RADIATION THERAPY: No  PAST MEDICAL HISTORY:  has a past medical history of Allergy, Chronic atrial fibrillation ([place]), History of colonoscopy  (04/2001), Mitral valve prolapse, Osteoarthritis of hip, PONV (postoperative nausea and vomiting), Prostate cancer ([place]), and Vertigo (01/07/2017).    PAST SURGICAL HISTORY: Past Surgical History:  Procedure Laterality Date  . COLONOSCOPY    . KNEE SURGERY     left at age 12 in [place]  . PROSTATE BIOPSY    . spinal injection     December 2013  . TOTAL HIP ARTHROPLASTY Right 01/29/2017   Procedure: RIGHT TOTAL HIP ARTHROPLASTY ANTERIOR APPROACH;  Surgeon: Rod Can, MD;  Location: [facility];  Service: Orthopedics;  Laterality: Right;  Marland Kitchen VASECTOMY      FAMILY HISTORY: family history includes Arrhythmia in his father; Breast cancer in his paternal aunt and sister; Cancer in his mother; Diabetes in his mother; Heart attack in his mother.  SOCIAL HISTORY:  reports that he has never smoked. He has never used smokeless tobacco. He reports that he does not drink alcohol or use drugs.  ALLERGIES: Patient has no known allergies.  MEDICATIONS:  Current Outpatient Medications  Medication Sig Dispense Refill  . Ascorbic Acid (VITAMIN C) 500 MG CAPS Take by mouth.    . Cyanocobalamin (B-12) 500 MCG TABS Take 500 mcg by mouth daily.    Marland Kitchen ELIQUIS 5 MG TABS tablet TAKE 1 TABLET BY MOUTH TWICE A DAY 180 tablet 1  . metoprolol tartrate (LOPRESSOR) 50 MG tablet TAKE 1/2 TABLET BY MOUTH EVERY DAY 45 tablet 3  . Multiple Vitamin (MULTIVITAMIN) tablet Take 1 tablet daily by mouth.    . oxyCODONE (OXY IR/ROXICODONE) 5 MG  immediate release tablet oxycodone 5 mg tablet    . sildenafil (VIAGRA) 100 MG tablet TAKE 1/2 TO 1  (100 MG DOSE) BY MOUTH DAILY AS NEEDED FOR ERECTILE DYSFUNCTION. 10 tablet 5   No current facility-administered medications for this encounter.    REVIEW OF SYSTEMS:  On review of systems, the patient reports that he is doing well overall. He denies any chest pain, shortness of breath, cough, fevers, chills, night sweats, unintended weight changes. He denies any bowel disturbances,  and denies abdominal pain, nausea or vomiting. He denies any new musculoskeletal or joint aches or pains. His IPSS was 4, indicating mild urinary symptoms. He reports some weak stream, nocturia x1, and occasional frequency. His SHIM was 20, indicating he has mild erectile dysfunction. A complete review of systems is obtained and is otherwise negative.   PHYSICAL EXAM:  Wt Readings from Last 3 Encounters:  04/08/19 177 lb 3.2 oz (80.4 kg)  09/07/18 179 lb (81.2 kg)  09/01/18 179 lb 4 oz (81.3 kg)   Temp Readings from Last 3 Encounters:  04/08/19 98.4 F (36.9 C)  09/07/18 98.9 F (37.2 C)  09/01/18 97.8 F (36.6 C)   BP Readings from Last 3 Encounters:  04/08/19 116/83  09/07/18 105/70  09/01/18 100/70   Pulse Readings from Last 3 Encounters:  04/08/19 88  09/07/18 74  09/01/18 88   Pain Assessment Pain Score: 0-No pain/10  In general this is a well appearing Caucasian male in no acute distress. He's alert and oriented x4 and appropriate throughout the examination. Cardiopulmonary assessment is negative for acute distress and he exhibits normal effort.    KPS = 100  100 - Normal; no complaints; no evidence of disease. 90   - Able to carry on normal activity; minor signs or symptoms of disease. 80   - Normal activity with effort; some signs or symptoms of disease. 53   - Cares for self; unable to carry on normal activity or to do active work. 60   - Requires occasional assistance, but is able to care for most of his personal needs. 50   - Requires considerable assistance and frequent medical care. 56   - Disabled; requires special care and assistance. 2   - Severely disabled; hospital admission is indicated although death not imminent. 94   - Very sick; hospital admission necessary; active supportive treatment necessary. 10   - Moribund; fatal processes progressing rapidly. 0     - Dead  Karnofsky DA, Abelmann [place], Craver LS and Burchenal [facility] [phone]) The use of the nitrogen  mustards in the palliative treatment of carcinoma: with particular reference to bronchogenic carcinoma Cancer 1 [phone]   LABORATORY DATA:  Lab Results  Component Value Date   WBC 12.1 (H) 01/30/2017   HGB 9.2 (L) 01/30/2017   HCT 27.0 (L) 01/30/2017   MCV 100.7 (H) 01/30/2017   PLT 232 01/30/2017   Lab Results  Component Value Date   NA 139 01/30/2017   K 4.2 01/30/2017   CL 111 01/30/2017   CO2 22 01/30/2017   Lab Results  Component Value Date   ALT 14 (L) 01/23/2017   AST 21 01/23/2017   ALKPHOS 56 01/23/2017   BILITOT 1.0 01/23/2017     RADIOGRAPHY: No results found.    IMPRESSION/PLAN: 69 y.o. gentleman with Stage T1c adenocarcinoma of the prostate with a PSA of 4.62 and a Gleason score of 4+3.    We discussed the patient's workup and outlined the nature  of prostate cancer in this setting. The patient's T stage, Gleason's score, and PSA put him into the unfavorable intermediate risk group. Accordingly, he is eligible for a variety of potential treatment options including brachytherapy, 5.5 weeks of external radiation or prostatectomy. We discussed the available radiation techniques, and focused on the details and logistics and delivery. We discussed and outlined the risks, benefits, short and long-term effects associated with radiotherapy and compared and contrasted these with prostatectomy.    The patient focused most of his questions and interest in robotic-assisted laparoscopic radical prostatectomy.  We discussed some of the potential advantages of surgery including surgical staging, the availability of salvage radiotherapy to the prostatic fossa, and the confidence associated with immediate biochemical response.  We discussed some of the potential proven indications for postoperative radiotherapy including positive margins, extracapsular extension, and seminal vesicle involvement. We also talked about some of the other potential findings leading to a recommendation for  radiotherapy including a non-zero postoperative PSA and positive lymph nodes. He was encouraged to ask questions that were answered to his stated satisfaction.   At the conclusion of our conversation, the patient appears to be leaning towards prostatectomy. He will meet with Dr. Alinda Money and Dr. Alen Blew as part of the multidisciplinary clinic to further discuss treatment options. We wished him well if he does proceed with prostatectomy and reminded him that we are here if he needs [place] in the future.      Nicholos Johns, PA-C    Tyler Pita, MD  [facility] Direct Dial: [phone]  Fax: [phone] [email or web address]  Skype  [email or web address]   This document serves as a record of services personally performed by Tyler Pita, MD and Freeman Caldron, PA-C. It was created on their behalf by Wilburn Mylar, a trained medical scribe. The creation of this record is based on the scribe's personal observations and the provider's statements to them. This document has been checked and approved by the attending provider.

## 2019-04-08 NOTE — Progress Notes (Signed)
[employer] Psychosocial Distress Screening Spiritual Care  Met with Nora and his wife [name] in [facility] to introduce [place] team/resources, reviewing distress screen per protocol.  The patient scored a 3 on the Psychosocial Distress Thermometer which indicates mild distress. Also assessed for distress and other psychosocial needs.   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  Follow up needed: No. Couple is aware of ongoing chaplain, counselor, and Support Team availability, should need/desire arise.   [place], [place], [facility] Pager [phone] Voicemail [phone]

## 2019-04-08 NOTE — Consult Note (Signed)
Multi-Disciplinary Clinic     04/08/2019   --------------------------------------------------------------------------------   Raymond Ray  MRN: [id number]  DOB: 01/14/1951, 69 year old Male  SSN:    PRIMARY CARE:  [name] A. Perini, MD  REFERRING:  Ardis Hughs, MD  PROVIDER:  Louis Meckel, M.D.  TREATING:  [name], M.D.  LOCATION:  [facility] [phone]     --------------------------------------------------------------------------------   CC/HPI: CC: Prostate Cancer   Physician requesting consult: Dr. Burman Nieves  PCP: Dr. 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 This prompted a TRUS biopsy of the prostate on 02/28/19 that demonstrated Gleason 4+3=7 adenocarcinoma with 5 out of 12 biopsy cores positive for malignancy.   Family history: None.   Imaging studies: None.   PMH: He has a history of atrial fibrillation (Eliquis) and chronic back pain.  PSH: No abdominal surgeries.   TNM stage: cT1c Nx Mx  PSA: 4.6  Gleason score: 4+3=7  Biopsy (02/28/19): 5/12 cores positive  Left: L mid (70%, 3+3=6), L lateral base (10%, 3+3=6), L base (30%, 4+3=7)  Right: R lateral mid (<5%, 3+3=6), R base (5%, 3+3=6)  Prostate volume: 32.2 cc   Nomogram  OC disease: 48%  EPE: 48%  SVI: 8%  LNI: 8%  PFS (5 year, 10 year): 70%, 55%   Urinary function: IPSS is 4.  Erectile function: SHIM score is 20. He does take Viagra regularly but does have reliable results. He takes between 50 and 100 mg prn.     ALLERGIES: No Allergies    MEDICATIONS: Metoprolol Tartrate 50 mg tablet tablet  Tadalafil 5 mg tablet 2 tablet PO Daily PRN  Allegra Allergy 180 mg tablet  Ambien 10 mg tablet tablet  Amitriptyline Hcl  25 mg tablet  Eliquis 5 mg tablet tablet  Lyrica  Multivitamin     GU PSH: Prostate Needle Biopsy - 02/28/2019     NON-GU PSH: Anesth, Vasectomy - 1985 Hip Replacement - 2018 Surgical Pathology, Gross And Microscopic Examination For Prostate Needle - 02/28/2019     GU PMH: Prostate Cancer - 03/10/2019 Elevated PSA - 01/25/2019, - 07/26/2018, - 05/20/2018 ED due to arterial insufficiency - 07/26/2018    NON-GU PMH: Atrial Fibrillation    FAMILY HISTORY: 2 daughters - Daughter   SOCIAL HISTORY: Marital Status: Married Preferred Language: English; Ethnicity: Not Hispanic Or Latino; Race: White Current Smoking Status: Patient has never smoked.   Tobacco Use Assessment Completed: Used Tobacco in last 30 days? Does not use smokeless tobacco. Has never drank.  Does not use drugs. Does not drink caffeine. Has not had a blood transfusion. Patient's occupation IT consultant.    REVIEW OF SYSTEMS:    GU Review Male:   Patient denies frequent urination, hard to postpone urination, burning/ pain with urination, get up at night to urinate, leakage of urine, stream starts and stops, trouble starting your streams, and have to strain to urinate .  Gastrointestinal (Lower):   Patient denies diarrhea and constipation.  Gastrointestinal (Upper):   Patient denies nausea and vomiting.  Constitutional:   Patient denies fever, night sweats, weight loss, and fatigue.  Skin:   Patient denies skin rash/ lesion and itching.  Eyes:   Patient denies blurred  vision and double vision.  Ears/ Nose/ Throat:   Patient denies sore throat and sinus problems.  Hematologic/Lymphatic:   Patient denies swollen glands and easy bruising.  Cardiovascular:   Patient denies leg swelling and chest pains.  Respiratory:   Patient denies cough and shortness of breath.  Endocrine:   Patient denies excessive thirst.  Musculoskeletal:   Patient denies back pain and joint pain.  Neurological:   Patient denies headaches and  dizziness.  Psychologic:   Patient denies anxiety and depression.   VITAL SIGNS: None   GU PHYSICAL EXAMINATION:    Prostate: Prostate about 40 grams. Left lobe normal consistency, right lobe normal consistency. Symmetrical lobes. No prostate nodule. Left lobe no tenderness, right lobe no tenderness.    MULTI-SYSTEM PHYSICAL EXAMINATION:    Constitutional: Well-nourished. No physical deformities. Normally developed. Good grooming.  Neck: Neck symmetrical, not swollen. Normal tracheal position.  Respiratory: No labored breathing, no use of accessory muscles. Clear bilaterally.  Cardiovascular: Normal temperature, normal extremity pulses, no swelling, no varicosities. Regular rate and rhythm.  Lymphatic: No enlargement of neck, axillae, groin.  Skin: No paleness, no jaundice, no cyanosis. No lesion, no ulcer, no rash.  Neurologic / Psychiatric: Oriented to time, oriented to place, oriented to person. No depression, no anxiety, no agitation.  Gastrointestinal: No mass, no tenderness, no rigidity, non obese abdomen.  Eyes: Normal conjunctivae. Normal eyelids.  Ears, Nose, Mouth, and Throat: Left ear no scars, no lesions, no masses. Right ear no scars, no lesions, no masses. Nose no scars, no lesions, no masses. Normal hearing. Normal lips.  Musculoskeletal: Normal gait and station of head and neck.     PAST DATA REVIEWED:  Source Of History:  Patient  Lab Test Review:   PSA  Records Review:   Pathology Reports, Previous Patient Records   01/18/19 07/08/18  PSA  Total PSA 4.62 ng/mL 4.131 ng/dl  Free PSA 0.42 ng/mL   % Free PSA 9 % PSA     PROCEDURES: None   ASSESSMENT:      ICD-10 Details  1 GU:   Prostate Cancer - C61    PLAN:           Document Letter(s):  Created for Patient: Clinical Summary         Notes:   1. Unfavorable intermediate risk prostate cancer: I had a detailed discussion with Mr. Keil and his wife today regarding his treatment options. He has been well  informed through his discussions with Dr. Louis Meckel, and now today with Dr. Alen Blew and Dr. Tammi Klippel. He has made the decision that he wishes to proceed with surgical therapy.   The patient was counseled about the natural history of prostate cancer and the standard treatment options that are available for prostate cancer. It was explained to him how his age and life expectancy, clinical stage, Gleason score, and PSA affect his prognosis, the decision to proceed with additional staging studies, as well as how that information influences recommended treatment strategies. We discussed the roles for active surveillance, radiation therapy, surgical therapy, androgen deprivation, as well as ablative therapy options for the treatment of prostate cancer as appropriate to his individual cancer situation. We discussed the risks and benefits of these options with regard to their impact on cancer control and also in terms of potential adverse events, complications, and impact on quality of life particularly related to urinary and sexual function. The patient was encouraged to ask questions throughout the discussion today and all questions were  answered to his stated satisfaction. In addition, the patient was provided with and/or directed to appropriate resources and literature for further education about prostate cancer and treatment options. We discussed surgical therapy for prostate cancer including the different available surgical approaches. We discussed, in detail, the risks and expectations of surgery with regard to cancer control, urinary control, and erectile function as well as the expected postoperative recovery process. Additional risks of surgery including but not limited to bleeding, infection, hernia formation, nerve damage, lymphocele formation, bowel/rectal injury potentially necessitating colostomy, damage to the urinary tract resulting in urine leakage, urethral stricture, and the cardiopulmonary risks such as  myocardial infarction, stroke, death, venothromboembolism, etc. were explained. The risk of open surgical conversion for robotic/laparoscopic prostatectomy was also discussed.   He will be scheduled for a bilateral nerve-sparing robot assisted laparoscopic radical prostatectomy and bilateral pelvic lymphadenectomy. He has appropriate expectations about return of erectile function. He understands that he will need to stop his Eliquis at least 48 hours preoperatively. He is interested in proceeding with surgery just after Easter. 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   Cc: Dr. [name] Perini  Dr. Burman Nieves  Dr. Zola Button his  Dr. Tyler Pita     E & M CODES: I spent 62 minutes total time with the patient.

## 2019-04-08 NOTE — Progress Notes (Signed)
Reason for the request:    Prostate cancer  HPI: I was asked by Dr. Louis Meckel to evaluate Raymond Ray for diagnosis of prostate cancer.  He is a 68 year old man was found to have an elevated PSA of 4.6.  He was evaluated by Dr. Louis Meckel and a biopsy completed on February 28, 2019.  Biopsy showed Gleason score 4+3 equal 7 with 5 out of 12 cores involved in total.  There is a secondary pattern of 3+3 = 6 in the left lateral base and left medial and mid right medial base and right lateral mid.  Patient referred to the prostate cancer multidisciplinary clinic to discuss treatment options.  Clinically, he reports no complaints at this time.  He remains very active and attends to activities of daily living.  He exercises regularly without any recent decline in ability to do so.  He denies any frequency urgency nocturia or hematuria.  He does not report any headaches, blurry vision, syncope or seizures. Does not report any fevers, chills or sweats.  Does not report any cough, wheezing or hemoptysis.  Does not report any chest pain, palpitation, orthopnea or leg edema.  Does not report any nausea, vomiting or abdominal pain.  Does not report any constipation or diarrhea.  Does not report any skeletal complaints.    Does not report frequency, urgency or hematuria.  Does not report any skin rashes or lesions. Does not report any heat or cold intolerance.  Does not report any lymphadenopathy or petechiae.  Does not report any anxiety or depression.  Remaining review of systems is negative.    Past Medical History:  Diagnosis Date  . Allergy   . Chronic atrial fibrillation ([place])   . History of colonoscopy 04/2001   negative  . Mitral valve prolapse   . Osteoarthritis of hip   . PONV (postoperative nausea and vomiting)    left knee cartilage removal;    . Prostate cancer ([place])   . Vertigo 01/07/2017   currently not symptomatic   :  Past Surgical History:  Procedure Laterality Date  . COLONOSCOPY    . KNEE  SURGERY     left at age 2 in [place]  . PROSTATE BIOPSY    . spinal injection     December 2013  . TOTAL HIP ARTHROPLASTY Right 01/29/2017   Procedure: RIGHT TOTAL HIP ARTHROPLASTY ANTERIOR APPROACH;  Surgeon: Rod Can, MD;  Location: [facility];  Service: Orthopedics;  Laterality: Right;  Marland Kitchen VASECTOMY    :   Current Outpatient Medications:  .  acetaminophen (TYLENOL) 500 MG tablet, Take 500 mg by mouth as needed., Disp: , Rfl:  .  Cyanocobalamin (B-12) 500 MCG TABS, Take 500 mcg by mouth daily., Disp: , Rfl:  .  diclofenac Sodium (VOLTAREN) 1 % GEL, Voltaren 1 % topical gel  APPLY 2 GRAM TO THE AFFECTED AREA(S) BY TOPICAL ROUTE 2 or 3 TIMES PER DAY, Disp: , Rfl:  .  ELIQUIS 5 MG TABS tablet, TAKE 1 TABLET BY MOUTH TWICE A DAY, Disp: 180 tablet, Rfl: 1 .  fexofenadine (ALLEGRA) 180 MG tablet, Take 90 mg by mouth as needed for allergies (for allergies or skin itching). , Disp: , Rfl:  .  L-Methylfolate-Algae-B12-B6 (METANX) 3-90.314-2-35 MG CAPS, Metanx (algal oil) 3 mg-35 mg-2 mg-90.314 mg capsule  Take 2 capsules every day by oral route., Disp: , Rfl:  .  metoprolol tartrate (LOPRESSOR) 50 MG tablet, TAKE 1/2 TABLET BY MOUTH EVERY DAY, Disp: 45 tablet, Rfl: 3 .  Multiple Vitamin (MULTIVITAMIN) tablet, Take 1 tablet daily by mouth., Disp: , Rfl:  .  Na Sulfate-K Sulfate-Mg Sulf 17.5-3.13-1.6 GM/177ML SOLN, Suprep-Use as directed, Disp: 354 mL, Rfl: 0 .  oxyCODONE (OXY IR/ROXICODONE) 5 MG immediate release tablet, oxycodone 5 mg tablet, Disp: , Rfl:  .  oxyCODONE-acetaminophen (PERCOCET) 10-325 MG tablet, 1 tablet as needed., Disp: , Rfl:  .  pregabalin (LYRICA) 75 MG capsule, pregabalin 75 mg capsule  TAKE 1 CAPSULE BY MOUTH TWICE A DAY, Disp: , Rfl:  .  sildenafil (VIAGRA) 100 MG tablet, TAKE 1/2 TO 1  (100 MG DOSE) BY MOUTH DAILY AS NEEDED FOR ERECTILE DYSFUNCTION., Disp: 10 tablet, Rfl: 5 .  zolpidem (AMBIEN) 10 MG tablet, Take 10 mg by mouth at bedtime. , Disp: 30 tablet, Rfl:  0:  No Known Allergies:  Family History  Problem Relation Age of Onset  . Arrhythmia Father        tachycardia  . Cancer Mother   . Heart attack Mother   . Diabetes Mother   . Breast cancer Sister   . Colon cancer Neg Hx   . Esophageal cancer Neg Hx   . Stomach cancer Neg Hx   . Rectal cancer Neg Hx   :  Social History   Socioeconomic History  . Marital status: Married    Spouse name: Not on file  . Number of children: 2  . Years of education: Not on file  . Highest education level: Not on file  Occupational History  . Not on file  Tobacco Use  . Smoking status: Never Smoker  . Smokeless tobacco: Never Used  Substance and Sexual Activity  . Alcohol use: No  . Drug use: No  . Sexual activity: Yes  Other Topics Concern  . Not on file  Social History Narrative   Right handed   Lives in two story home with wife   Social Determinants of Health   Financial Resource Strain:   . Difficulty of Paying Living Expenses: Not on file  Food Insecurity:   . Worried About Charity fundraiser in the Last Year: Not on file  . Ran Out of Food in the Last Year: Not on file  Transportation Needs:   . Lack of Transportation (Medical): Not on file  . Lack of Transportation (Non-Medical): Not on file  Physical Activity:   . Days of Exercise per Week: Not on file  . Minutes of Exercise per Session: Not on file  Stress:   . Feeling of Stress : Not on file  Social Connections:   . Frequency of Communication with Friends and Family: Not on file  . Frequency of Social Gatherings with Friends and Family: Not on file  . Attends Religious Services: Not on file  . Active Member of Clubs or Organizations: Not on file  . Attends Archivist Meetings: Not on file  . Marital Status: Not on file  Intimate Partner Violence:   . Fear of Current or Ex-Partner: Not on file  . Emotionally Abused: Not on file  . Physically Abused: Not on file  . Sexually Abused: Not on file   :  Pertinent items are noted in HPI.  Exam: ECOG 0 General appearance: alert and cooperative appeared without distress. Head: atraumatic without any abnormalities. Eyes: conjunctivae/corneas clear. PERRL.  Sclera anicteric. Throat: lips, mucosa, and tongue normal; without oral thrush or ulcers. Resp: clear to auscultation bilaterally without rhonchi, wheezes or dullness to percussion. Cardio: regular rate and rhythm,  S1, S2 normal, no murmur, click, rub or gallop GI: soft, non-tender; bowel sounds normal; no masses,  no organomegaly Skin: Skin color, texture, turgor normal. No rashes or lesions Lymph nodes: Cervical, supraclavicular, and axillary nodes normal. Neurologic: Grossly normal without any motor, sensory or deep tendon reflexes. Musculoskeletal: No joint deformity or effusion.  CBC    Component Value Date/Time   WBC 12.1 (H) 01/30/2017 0505   RBC 2.68 (L) 01/30/2017 0505   HGB 9.2 (L) 01/30/2017 0505   HGB 11.3 (L) 01/07/2017 1051   HCT 27.0 (L) 01/30/2017 0505   HCT 32.7 (L) 01/07/2017 1051   PLT 232 01/30/2017 0505   PLT 234 01/07/2017 1051   MCV 100.7 (H) 01/30/2017 0505   MCV 103 (H) 01/07/2017 1051   MCH 34.3 (H) 01/30/2017 0505   MCHC 34.1 01/30/2017 0505   RDW 14.0 01/30/2017 0505   RDW 15.0 01/07/2017 1051   LYMPHSABS 0.7 01/07/2017 1315   LYMPHSABS 0.5 (L) 01/07/2017 1051   MONOABS 0.2 01/07/2017 1315   EOSABS 0.0 01/07/2017 1315   EOSABS 0.0 01/07/2017 1051   BASOSABS 0.0 01/07/2017 1315   BASOSABS 0.0 01/07/2017 1051     Chemistry      Component Value Date/Time   NA 139 01/30/2017 0505   NA 142 01/07/2017 1051   K 4.2 01/30/2017 0505   CL 111 01/30/2017 0505   CO2 22 01/30/2017 0505   BUN 23 (H) 01/30/2017 0505   BUN 25 01/07/2017 1051   CREATININE 1.00 01/30/2017 0505      Component Value Date/Time   CALCIUM 8.2 (L) 01/30/2017 0505   ALKPHOS 56 01/23/2017 1412   AST 21 01/23/2017 1412   ALT 14 (L) 01/23/2017 1412   BILITOT 1.0  01/23/2017 1412   BILITOT 0.9 01/07/2017 1051       Assessment and Plan:   70 year old man with prostate cancer diagnosed in January 2021.  He was found to have a PSA 4.6 and a Gleason score 4+3 = 7 and 1 core with 30% involvement.  He had 4 other cores with Gleason score 3+3 equal 6.   This case was discussed in the prostate cancer multidisciplinary clinic including discussion with his reviewing pathologist.  Treatment options were reviewed which include primary surgical therapy versus definitive therapy with radiation.  Given his low intermediate risk potentially doing radiation without androgen deprivation would be reasonable.  Risks and benefits of both approaches were debated.  The role of systemic therapy form of androgen deprivation, oral targeted therapy, systemic chemotherapy was reviewed today.  For the most part there is really no indication for systemic therapy.  Possible therapy for androgen deprivation short-term could be debated but also could be omitted at this time.  He is considering these options at this time I am leaning towards primary surgical therapy at this time.  45  minutes was spent on this encounter.  The time was dedicated to reviewing his disease status, reviewing pathology reports, discussing his case with other specialist as well as answering questions regarding future plan of care.    A copy of this consult has been forwarded to the requesting physician.

## 2019-04-15 ENCOUNTER — Telehealth: Payer: Self-pay | Admitting: Medical Oncology

## 2019-04-15 NOTE — Telephone Encounter (Signed)
Spoke with patient to follow up post [facility]. He voiced his appreciation for the clinic and the knowledge that was shared. He is scheduled for robotic prostatectomy with Dr. Alinda Money, on 06/02/19. I wished him well and encouraged him to call me if I can answer any questions or assist him, in any way. He voiced understanding.

## 2019-04-27 ENCOUNTER — Ambulatory Visit: Payer: Medicare Other | Attending: Internal Medicine

## 2019-04-27 ENCOUNTER — Ambulatory Visit: Payer: Medicare Other

## 2019-04-27 DIAGNOSIS — Z23 Encounter for immunization: Secondary | ICD-10-CM

## 2019-04-27 NOTE — Progress Notes (Signed)
   Covid-19 Vaccination Clinic  Name:  Raymond Ray    MRN: [id number] DOB: 05/06/1950  04/27/2019  [name] was observed post Covid-19 immunization for 15 minutes without incident. He was provided with Vaccine Information Sheet and instruction to access the V-Safe system.   [name] was instructed to call 911 with any severe reactions post vaccine: Marland Kitchen Difficulty breathing  . Swelling of face and throat  . A fast heartbeat  . A bad rash all over body  . Dizziness and weakness   Immunizations Administered    Name Date Dose VIS Date Route   Pfizer COVID-19 Vaccine 04/27/2019  3:00 PM 0.3 mL 02/04/2019 Intramuscular   Manufacturer: [place]   Lot: [id number]   [place]: [id number]

## 2019-05-04 DIAGNOSIS — C61 Malignant neoplasm of prostate: Secondary | ICD-10-CM | POA: Diagnosis not present

## 2019-05-04 DIAGNOSIS — M6281 Muscle weakness (generalized): Secondary | ICD-10-CM | POA: Diagnosis not present

## 2019-05-04 DIAGNOSIS — M62838 Other muscle spasm: Secondary | ICD-10-CM | POA: Diagnosis not present

## 2019-05-06 ENCOUNTER — Other Ambulatory Visit: Payer: Self-pay | Admitting: Cardiovascular Disease

## 2019-05-06 NOTE — Telephone Encounter (Signed)
68 M 81.3 kg, SCr 1.3 04/2018, LOV 06/2018 [place]

## 2019-05-12 DIAGNOSIS — I83812 Varicose veins of left lower extremities with pain: Secondary | ICD-10-CM | POA: Diagnosis not present

## 2019-05-24 DIAGNOSIS — N393 Stress incontinence (female) (male): Secondary | ICD-10-CM | POA: Diagnosis not present

## 2019-05-24 DIAGNOSIS — M6281 Muscle weakness (generalized): Secondary | ICD-10-CM | POA: Diagnosis not present

## 2019-05-24 DIAGNOSIS — M62838 Other muscle spasm: Secondary | ICD-10-CM | POA: Diagnosis not present

## 2019-05-25 NOTE — Progress Notes (Signed)
PCP - Crist Infante, MD  Cardiologist - [name], MD w/ LOV date 07-13-18 to f/u in 1 year  Chest x-ray -  EKG -  Stress Test -  ECHO - 03-05-18 Cardiac Cath -   Sleep Study -  CPAP -   Fasting Blood Sugar -  Checks Blood Sugar _____ times a day  Blood Thinner Instructions:Eliquis Aspirin Instructions: Last Dose:  Anesthesia review: Hx of Chronic A-Fib, Mitral valve prolapse  Patient denies shortness of breath, fever, cough and chest pain at PAT appointment   Patient verbalized understanding of instructions that were given to them at the PAT appointment. Patient was also instructed that they will need to review over the PAT instructions again at home before surgery.

## 2019-05-25 NOTE — Patient Instructions (Addendum)
DUE TO COVID-19 ONLY ONE VISITOR IS ALLOWED TO COME WITH YOU AND STAY IN THE WAITING ROOM ONLY DURING PRE OP AND PROCEDURE DAY OF SURGERY. THE 1 VISITOR MAY VISIT WITH YOU AFTER SURGERY IN YOUR PRIVATE ROOM DURING VISITING HOURS ONLY!  YOU NEED TO HAVE A COVID 19 TEST ON 05-30-19 @2 :00 PM, THIS TEST MUST BE DONE BEFORE SURGERY, COME  [address], [place] , [id number].  ([place]) ONCE YOUR COVID TEST IS COMPLETED, PLEASE BEGIN THE QUARANTINE INSTRUCTIONS AS OUTLINED IN YOUR HANDOUT.                Raymond Ray  05/25/2019   Your procedure is scheduled on: 06-02-19   Report to [facility] Main  Entrance    Report to Short Stay at 5:30  AM     Call this number if you have problems the morning of surgery [phone]    Remember: Do not eat food or drink liquids :After Midnight.     Take these medicines the morning of surgery with A SIP OF WATER: Metoprolol Tartrate (Lopressor), and Percocet, prn  BRUSH YOUR TEETH MORNING OF SURGERY AND RINSE YOUR MOUTH OUT, NO CHEWING GUM CANDY OR MINTS.                                You may not have any metal on your body including hair pins and              piercings     Do not wear jewelry,cologne, lotions, powders or deodorant                    Men may shave face and neck.   Do not bring valuables to the hospital. [place].  Contacts, dentures or bridgework may not be worn into surgery.  You may bring an overnight bag      Special Instructions: Please follow your prep, per your surgeon's instructions              Please read over the following fact sheets you were given: _____________________________________________________________________             [facility] - Preparing for Surgery Before surgery, you can play an important role.  Because skin is not sterile, your skin needs to be as free of germs as possible.  You can reduce the number of germs on  your skin by washing with CHG (chlorahexidine gluconate) soap before surgery.  CHG is an antiseptic cleaner which kills germs and bonds with the skin to continue killing germs even after washing. Please DO NOT use if you have an allergy to CHG or antibacterial soaps.  If your skin becomes reddened/irritated stop using the CHG and inform your nurse when you arrive at Short Stay. Do not shave (including legs and underarms) for at least 48 hours prior to the first CHG shower.  You may shave your face/neck. Please follow these instructions carefully:  1.  Shower with CHG Soap the night before surgery and the  morning of Surgery.  2.  If you choose to wash your hair, wash your hair first as usual with your  normal  shampoo.  3.  After you shampoo, rinse your hair and body thoroughly to remove the  shampoo.  4.  Use CHG as you would any other liquid soap.  You can apply chg directly  to the skin and wash                       Gently with a scrungie or clean washcloth.  5.  Apply the CHG Soap to your body ONLY FROM THE NECK DOWN.   Do not use on face/ open                           Wound or open sores. Avoid contact with eyes, ears mouth and genitals (private parts).                       Wash face,  Genitals (private parts) with your normal soap.             6.  Wash thoroughly, paying special attention to the area where your surgery  will be performed.  7.  Thoroughly rinse your body with warm water from the neck down.  8.  DO NOT shower/wash with your normal soap after using and rinsing off  the CHG Soap.                9.  Pat yourself dry with a clean towel.            10.  Wear clean pajamas.            11.  Place clean sheets on your bed the night of your first shower and do not  sleep with pets. Day of Surgery : Do not apply any lotions/deodorants the morning of surgery.  Please wear clean clothes to the hospital/surgery center.  FAILURE TO FOLLOW THESE INSTRUCTIONS MAY  RESULT IN THE CANCELLATION OF YOUR SURGERY PATIENT SIGNATURE_________________________________  NURSE SIGNATURE__________________________________  ________________________________________________________________________

## 2019-05-26 ENCOUNTER — Other Ambulatory Visit: Payer: Self-pay

## 2019-05-26 ENCOUNTER — Encounter (HOSPITAL_COMMUNITY)
Admission: RE | Admit: 2019-05-26 | Discharge: 2019-05-26 | Disposition: A | Discharge status: Home / Self Care | Payer: Medicare Other | Source: Ambulatory Visit | Attending: Urology | Admitting: Urology

## 2019-05-26 ENCOUNTER — Encounter (HOSPITAL_COMMUNITY): Payer: Self-pay

## 2019-05-26 DIAGNOSIS — I4891 Unspecified atrial fibrillation: Secondary | ICD-10-CM | POA: Diagnosis not present

## 2019-05-26 DIAGNOSIS — Z01810 Encounter for preprocedural cardiovascular examination: Secondary | ICD-10-CM | POA: Insufficient documentation

## 2019-05-26 LAB — CBC
HCT: 37.5 % — ABNORMAL LOW (ref 39.0–52.0)
Hemoglobin: 12.3 g/dL — ABNORMAL LOW (ref 13.0–17.0)
MCH: 36 pg — ABNORMAL HIGH (ref 26.0–34.0)
MCHC: 32.8 g/dL (ref 30.0–36.0)
MCV: 109.6 fL — ABNORMAL HIGH (ref 80.0–100.0)
Platelets: 263 10*3/uL (ref 150–400)
RBC: 3.42 MIL/uL — ABNORMAL LOW (ref 4.22–5.81)
RDW: 15 % (ref 11.5–15.5)
WBC: 6.4 10*3/uL (ref 4.0–10.5)
nRBC: 0 % (ref 0.0–0.2)

## 2019-05-26 LAB — BASIC METABOLIC PANEL
Anion gap: 9 (ref 5–15)
BUN: 26 mg/dL — ABNORMAL HIGH (ref 8–23)
CO2: 26 mmol/L (ref 22–32)
Calcium: 9.4 mg/dL (ref 8.9–10.3)
Chloride: 109 mmol/L (ref 98–111)
Creatinine, Ser: 1.28 mg/dL — ABNORMAL HIGH (ref 0.61–1.24)
GFR calc Af Amer: >60 mL/min (ref >60)
GFR calc non Af Amer: 58 mL/min — ABNORMAL LOW (ref >60)
Glucose, Bld: 96 mg/dL (ref 70–99)
Potassium: 4.6 mmol/L (ref 3.5–5.1)
Sodium: 144 mmol/L (ref 135–145)

## 2019-05-30 ENCOUNTER — Other Ambulatory Visit (HOSPITAL_COMMUNITY)
Admission: RE | Admit: 2019-05-30 | Discharge: 2019-05-30 | Disposition: A | Discharge status: Home / Self Care | Payer: Medicare Other | Source: Ambulatory Visit | Attending: Urology | Admitting: Urology

## 2019-05-30 DIAGNOSIS — Z01812 Encounter for preprocedural laboratory examination: Secondary | ICD-10-CM | POA: Insufficient documentation

## 2019-05-30 DIAGNOSIS — Z20822 Contact with and (suspected) exposure to covid-19: Secondary | ICD-10-CM | POA: Diagnosis not present

## 2019-05-30 LAB — SARS CORONAVIRUS 2 (TAT 6-24 HRS): SARS Coronavirus 2: NEGATIVE

## 2019-05-30 NOTE — Progress Notes (Signed)
BMP result routed to Dr. Alinda Money for review.

## 2019-06-01 NOTE — H&P (Signed)
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 This prompted a TRUS biopsy of the prostate on 02/28/19 that demonstrated Gleason 4+3=7 adenocarcinoma with 5 out of 12 biopsy cores positive for malignancy.   Family history: None.   Imaging studies: None.   PMH: He has a history of atrial fibrillation (Eliquis) and chronic back pain.  PSH: No abdominal surgeries.   TNM stage: cT1c Nx Mx  PSA: 4.6  Gleason score: 4+3=7  Biopsy (02/28/19): 5/12 cores positive  Left: L mid (70%, 3+3=6), L lateral base (10%, 3+3=6), L base (30%, 4+3=7)  Right: R lateral mid (<5%, 3+3=6), R base (5%, 3+3=6)  Prostate volume: 32.2 cc   Nomogram  OC disease: 48%  EPE: 48%  SVI: 8%  LNI: 8%  PFS (5 year, 10 year): 70%, 55%   Urinary function: IPSS is 4.  Erectile function: SHIM score is 20. He does take Viagra regularly but does have reliable results. He takes between 50 and 100 mg prn.     ALLERGIES: No Allergies    MEDICATIONS: Metoprolol Tartrate 50 mg tablet tablet  Tadalafil 5 mg tablet 2 tablet PO Daily PRN  Allegra Allergy 180 mg tablet  Ambien 10 mg tablet tablet  Amitriptyline Hcl 25 mg tablet  Eliquis 5 mg tablet tablet  Lyrica  Multivitamin     GU PSH: Prostate Needle Biopsy - 02/28/2019     NON-GU PSH: Anesth, Vasectomy - 1985 Hip Replacement - 2018 Surgical Pathology, Gross And Microscopic Examination For Prostate Needle - 02/28/2019     GU PMH: Prostate Cancer - 03/10/2019 Elevated PSA - 01/25/2019, - 07/26/2018, - 05/20/2018 ED due to arterial insufficiency - 07/26/2018    NON-GU PMH: Atrial Fibrillation    FAMILY HISTORY: 2 daughters - Daughter   SOCIAL HISTORY: Marital Status: Married Preferred Language: English; Ethnicity: Not Hispanic Or Latino; Race: White Current Smoking Status: Patient has never smoked.    Tobacco Use Assessment Completed: Used Tobacco in last 30 days? Does not use smokeless tobacco. Has never drank.  Does not use drugs. Does not drink caffeine. Has not had a blood transfusion. Patient's occupation IT consultant.    REVIEW OF SYSTEMS:    GU Review Male:   Patient denies frequent urination, hard to postpone urination, burning/ pain with urination, get up at night to urinate, leakage of urine, stream starts and stops, trouble starting your streams, and have to strain to urinate .  Gastrointestinal (Lower):   Patient denies diarrhea and constipation.  Gastrointestinal (Upper):   Patient denies nausea and vomiting.  Constitutional:   Patient denies fever, night sweats, weight loss, and fatigue.  Skin:   Patient denies skin rash/ lesion and itching.  Eyes:   Patient denies blurred vision and double vision.  Ears/ Nose/ Throat:   Patient denies sore throat and sinus problems.  Hematologic/Lymphatic:   Patient denies swollen glands and easy bruising.  Cardiovascular:   Patient denies leg swelling and chest pains.  Respiratory:   Patient denies cough and shortness of breath.  Endocrine:   Patient denies excessive thirst.  Musculoskeletal:   Patient denies back pain and joint pain.  Neurological:   Patient denies headaches and dizziness.  Psychologic:   Patient denies anxiety and depression.    MULTI-SYSTEM PHYSICAL EXAMINATION:    Constitutional: Well-nourished. No physical  deformities. Normally developed. Good grooming.  Neck: Neck symmetrical, not swollen. Normal tracheal position.  Respiratory: No labored breathing, no use of accessory muscles. Clear bilaterally.  Cardiovascular: Normal temperature, normal extremity pulses, no swelling, no varicosities. Regular rate and rhythm.  Lymphatic: No enlargement of neck, axillae, groin.  Skin: No paleness, no jaundice, no cyanosis. No lesion, no ulcer, no rash.  Neurologic / Psychiatric: Oriented to time, oriented to place,  oriented to person. No depression, no anxiety, no agitation.  Gastrointestinal: No mass, no tenderness, no rigidity, non obese abdomen.  Eyes: Normal conjunctivae. Normal eyelids.  Ears, Nose, Mouth, and Throat: Left ear no scars, no lesions, no masses. Right ear no scars, no lesions, no masses. Nose no scars, no lesions, no masses. Normal hearing. Normal lips.  Musculoskeletal: Normal gait and station of head and neck.     PAST DATA REVIEWED:  Source Of History:  Patient  Lab Test Review:   PSA  Records Review:   Pathology Reports, Previous Patient Records   01/18/19 07/08/18  PSA  Total PSA 4.62 ng/mL 4.131 ng/dl  Free PSA 0.42 ng/mL   % Free PSA 9 % PSA     PROCEDURES: None   ASSESSMENT:      ICD-10 Details  1 GU:   Prostate Cancer - C61    PLAN:      1. Unfavorable intermediate risk prostate cancer:He has made the decision that he wishes to proceed with surgical therapy.   He will be scheduled for a bilateral nerve-sparing robot assisted laparoscopic radical prostatectomy and bilateral pelvic lymphadenectomy. He has appropriate expectations about return of erectile function. He understands that he will need to stop his Eliquis at least 48 hours preoperatively. He is interested in proceeding with surgery just after Easter. 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

## 2019-06-02 ENCOUNTER — Observation Stay (HOSPITAL_COMMUNITY)
Admission: RE | Admit: 2019-06-02 | Discharge: 2019-06-03 | Disposition: A | Discharge status: Home / Self Care | Payer: Medicare Other | Attending: Urology | Admitting: Urology

## 2019-06-02 ENCOUNTER — Ambulatory Visit (HOSPITAL_COMMUNITY): Payer: Medicare Other | Admitting: Physician Assistant

## 2019-06-02 ENCOUNTER — Ambulatory Visit (HOSPITAL_COMMUNITY): Payer: Medicare Other | Admitting: Anesthesiology

## 2019-06-02 ENCOUNTER — Other Ambulatory Visit: Payer: Self-pay

## 2019-06-02 ENCOUNTER — Encounter (HOSPITAL_COMMUNITY): Payer: Self-pay | Admitting: Urology

## 2019-06-02 ENCOUNTER — Encounter (HOSPITAL_COMMUNITY)
Admission: RE | Disposition: A | Discharge status: Home / Self Care | Payer: Self-pay | Source: Home / Self Care | Attending: Urology

## 2019-06-02 DIAGNOSIS — N5201 Erectile dysfunction due to arterial insufficiency: Secondary | ICD-10-CM | POA: Insufficient documentation

## 2019-06-02 DIAGNOSIS — Z79899 Other long term (current) drug therapy: Secondary | ICD-10-CM | POA: Insufficient documentation

## 2019-06-02 DIAGNOSIS — Z96649 Presence of unspecified artificial hip joint: Secondary | ICD-10-CM | POA: Insufficient documentation

## 2019-06-02 DIAGNOSIS — G8929 Other chronic pain: Secondary | ICD-10-CM | POA: Diagnosis not present

## 2019-06-02 DIAGNOSIS — I340 Nonrheumatic mitral (valve) insufficiency: Secondary | ICD-10-CM | POA: Diagnosis not present

## 2019-06-02 DIAGNOSIS — I4891 Unspecified atrial fibrillation: Secondary | ICD-10-CM | POA: Diagnosis not present

## 2019-06-02 DIAGNOSIS — C61 Malignant neoplasm of prostate: Principal | ICD-10-CM | POA: Diagnosis present

## 2019-06-02 DIAGNOSIS — Z7901 Long term (current) use of anticoagulants: Secondary | ICD-10-CM | POA: Diagnosis not present

## 2019-06-02 DIAGNOSIS — N529 Male erectile dysfunction, unspecified: Secondary | ICD-10-CM | POA: Diagnosis not present

## 2019-06-02 HISTORY — PX: ROBOT ASSISTED LAPAROSCOPIC RADICAL PROSTATECTOMY: SHX5141

## 2019-06-02 HISTORY — PX: LYMPHADENECTOMY: SHX5960

## 2019-06-02 LAB — TYPE AND SCREEN
ABO/RH(D): O POS
Antibody Screen: NEGATIVE

## 2019-06-02 LAB — HEMOGLOBIN AND HEMATOCRIT, BLOOD
HCT: 32.6 % — ABNORMAL LOW (ref 39.0–52.0)
Hemoglobin: 10.5 g/dL — ABNORMAL LOW (ref 13.0–17.0)

## 2019-06-02 SURGERY — XI ROBOTIC ASSISTED LAPAROSCOPIC RADICAL PROSTATECTOMY LEVEL 2
Anesthesia: General

## 2019-06-02 MED ORDER — FENTANYL CITRATE (PF) 250 MCG/5ML IJ SOLN
INTRAMUSCULAR | Status: DC | PRN
  Administered 2019-06-02: 50 ug via INTRAVENOUS
  Administered 2019-06-02: 100 ug via INTRAVENOUS
  Administered 2019-06-02 (×2): 50 ug via INTRAVENOUS

## 2019-06-02 MED ORDER — PHENYLEPHRINE 40 MCG/ML (10ML) SYRINGE FOR IV PUSH (FOR BLOOD PRESSURE SUPPORT)
PREFILLED_SYRINGE | INTRAVENOUS | Status: AC
  Filled 2019-06-02: qty 10

## 2019-06-02 MED ORDER — PROMETHAZINE HCL 25 MG/ML IJ SOLN: 6.2500 mg | INTRAMUSCULAR | Status: DC | PRN

## 2019-06-02 MED ORDER — STERILE WATER FOR IRRIGATION IR SOLN
Status: DC | PRN
  Administered 2019-06-02: 1000 mL

## 2019-06-02 MED ORDER — KETAMINE HCL 10 MG/ML IJ SOLN
INTRAMUSCULAR | Status: AC
  Filled 2019-06-02: qty 1

## 2019-06-02 MED ORDER — CEFAZOLIN SODIUM-DEXTROSE 1-4 GM/50ML-% IV SOLN
1.0000 g | Freq: Three times a day (TID) | INTRAVENOUS | Status: AC
  Administered 2019-06-02 – 2019-06-03 (×2): 1 g via INTRAVENOUS
  Filled 2019-06-02 (×3): qty 50

## 2019-06-02 MED ORDER — LIDOCAINE 2% (20 MG/ML) 5 ML SYRINGE
INTRAMUSCULAR | Status: AC
  Filled 2019-06-02: qty 5

## 2019-06-02 MED ORDER — PHENYLEPHRINE 40 MCG/ML (10ML) SYRINGE FOR IV PUSH (FOR BLOOD PRESSURE SUPPORT)
PREFILLED_SYRINGE | INTRAVENOUS | Status: DC | PRN
  Administered 2019-06-02 (×7): 80 ug via INTRAVENOUS

## 2019-06-02 MED ORDER — DIPHENHYDRAMINE HCL 50 MG/ML IJ SOLN: 12.5000 mg | Freq: Four times a day (QID) | INTRAMUSCULAR | Status: DC | PRN

## 2019-06-02 MED ORDER — BELLADONNA ALKALOIDS-OPIUM 16.2-60 MG RE SUPP
1.0000 | Freq: Four times a day (QID) | RECTAL | Status: DC | PRN
  Administered 2019-06-02: 1 via RECTAL
  Filled 2019-06-02: qty 1

## 2019-06-02 MED ORDER — ONDANSETRON HCL 4 MG/2ML IJ SOLN
INTRAMUSCULAR | Status: DC | PRN
  Administered 2019-06-02: 4 mg via INTRAVENOUS

## 2019-06-02 MED ORDER — KETOROLAC TROMETHAMINE 15 MG/ML IJ SOLN
15.0000 mg | Freq: Four times a day (QID) | INTRAMUSCULAR | Status: DC
  Administered 2019-06-02 – 2019-06-03 (×3): 15 mg via INTRAVENOUS
  Filled 2019-06-02 (×3): qty 1

## 2019-06-02 MED ORDER — METOPROLOL TARTRATE 25 MG PO TABS
25.0000 mg | ORAL_TABLET | Freq: Every day | ORAL | Status: DC
  Administered 2019-06-03: 25 mg via ORAL
  Filled 2019-06-02: qty 1

## 2019-06-02 MED ORDER — BUPIVACAINE HCL 0.25 % IJ SOLN
INTRAMUSCULAR | Status: AC
  Filled 2019-06-02: qty 1

## 2019-06-02 MED ORDER — MIDAZOLAM HCL 2 MG/2ML IJ SOLN
INTRAMUSCULAR | Status: AC
  Filled 2019-06-02: qty 2

## 2019-06-02 MED ORDER — PROPOFOL 10 MG/ML IV BOLUS
INTRAVENOUS | Status: DC | PRN
  Administered 2019-06-02: 120 mg via INTRAVENOUS

## 2019-06-02 MED ORDER — LACTATED RINGERS IV SOLN
INTRAVENOUS | Status: DC | PRN
  Administered 2019-06-02: 1000 mL

## 2019-06-02 MED ORDER — MIDAZOLAM HCL 5 MG/5ML IJ SOLN
INTRAMUSCULAR | Status: DC | PRN
  Administered 2019-06-02: 2 mg via INTRAVENOUS

## 2019-06-02 MED ORDER — FENTANYL CITRATE (PF) 250 MCG/5ML IJ SOLN
INTRAMUSCULAR | Status: AC
  Filled 2019-06-02: qty 5

## 2019-06-02 MED ORDER — BACITRACIN-NEOMYCIN-POLYMYXIN 400-5-5000 EX OINT: 1.0000 "application " | TOPICAL_OINTMENT | Freq: Three times a day (TID) | CUTANEOUS | Status: DC | PRN

## 2019-06-02 MED ORDER — LACTATED RINGERS IV SOLN: INTRAVENOUS | Status: DC

## 2019-06-02 MED ORDER — BUPIVACAINE HCL 0.25 % IJ SOLN
INTRAMUSCULAR | Status: DC | PRN
  Administered 2019-06-02: 27 mL

## 2019-06-02 MED ORDER — ONDANSETRON HCL 4 MG/2ML IJ SOLN: 4.0000 mg | INTRAMUSCULAR | Status: DC | PRN

## 2019-06-02 MED ORDER — ONDANSETRON HCL 4 MG/2ML IJ SOLN
INTRAMUSCULAR | Status: AC
  Filled 2019-06-02: qty 2

## 2019-06-02 MED ORDER — FLEET ENEMA 7-19 GM/118ML RE ENEM: 1.0000 | ENEMA | Freq: Once | RECTAL | Status: DC

## 2019-06-02 MED ORDER — SODIUM CHLORIDE 0.9 % IV BOLUS: 1000.0000 mL | Freq: Once | INTRAVENOUS | Status: DC

## 2019-06-02 MED ORDER — OXYCODONE-ACETAMINOPHEN 10-325 MG PO TABS: 0.5000 | ORAL_TABLET | ORAL | 0 refills | Status: DC | PRN

## 2019-06-02 MED ORDER — KCL IN DEXTROSE-NACL 20-5-0.45 MEQ/L-%-% IV SOLN
INTRAVENOUS | Status: DC
  Filled 2019-06-02 (×3): qty 1000

## 2019-06-02 MED ORDER — LIDOCAINE HCL 2 % IJ SOLN
INTRAMUSCULAR | Status: AC
  Filled 2019-06-02: qty 20

## 2019-06-02 MED ORDER — LIDOCAINE HCL (CARDIAC) PF 100 MG/5ML IV SOSY
PREFILLED_SYRINGE | INTRAVENOUS | Status: DC | PRN
  Administered 2019-06-02: 100 mg via INTRAVENOUS

## 2019-06-02 MED ORDER — HYDROMORPHONE HCL 1 MG/ML IJ SOLN
INTRAMUSCULAR | Status: AC
  Administered 2019-06-02: 12:00:00 0.25 mg via INTRAVENOUS
  Filled 2019-06-02: qty 1

## 2019-06-02 MED ORDER — PROPOFOL 10 MG/ML IV BOLUS
INTRAVENOUS | Status: AC
  Filled 2019-06-02: qty 20

## 2019-06-02 MED ORDER — SULFAMETHOXAZOLE-TRIMETHOPRIM 800-160 MG PO TABS: 1.0000 | ORAL_TABLET | Freq: Two times a day (BID) | ORAL | 0 refills | Status: DC

## 2019-06-02 MED ORDER — SODIUM CHLORIDE 0.9 % IR SOLN
Status: DC | PRN
  Administered 2019-06-02: 1000 mL via INTRAVESICAL

## 2019-06-02 MED ORDER — HEPARIN SODIUM (PORCINE) 1000 UNIT/ML IJ SOLN
INTRAMUSCULAR | Status: AC
  Filled 2019-06-02: qty 1

## 2019-06-02 MED ORDER — CEFAZOLIN SODIUM-DEXTROSE 2-4 GM/100ML-% IV SOLN
2.0000 g | Freq: Once | INTRAVENOUS | Status: AC
  Administered 2019-06-02: 2 g via INTRAVENOUS

## 2019-06-02 MED ORDER — SUGAMMADEX SODIUM 200 MG/2ML IV SOLN
INTRAVENOUS | Status: DC | PRN
  Administered 2019-06-02: 200 mg via INTRAVENOUS

## 2019-06-02 MED ORDER — ACETAMINOPHEN 325 MG PO TABS
650.0000 mg | ORAL_TABLET | ORAL | Status: DC | PRN
  Administered 2019-06-02: 650 mg via ORAL
  Filled 2019-06-02: qty 2

## 2019-06-02 MED ORDER — CEFAZOLIN SODIUM-DEXTROSE 2-4 GM/100ML-% IV SOLN
INTRAVENOUS | Status: AC
  Filled 2019-06-02: qty 100

## 2019-06-02 MED ORDER — ZOLPIDEM TARTRATE 5 MG PO TABS
5.0000 mg | ORAL_TABLET | Freq: Every evening | ORAL | Status: DC | PRN
  Administered 2019-06-02: 22:00:00 5 mg via ORAL
  Filled 2019-06-02: qty 1

## 2019-06-02 MED ORDER — DOCUSATE SODIUM 100 MG PO CAPS
100.0000 mg | ORAL_CAPSULE | Freq: Two times a day (BID) | ORAL | Status: DC
  Administered 2019-06-02 – 2019-06-03 (×3): 100 mg via ORAL
  Filled 2019-06-02 (×2): qty 1

## 2019-06-02 MED ORDER — ROCURONIUM BROMIDE 100 MG/10ML IV SOLN
INTRAVENOUS | Status: DC | PRN
  Administered 2019-06-02: 10 mg via INTRAVENOUS
  Administered 2019-06-02: 20 mg via INTRAVENOUS
  Administered 2019-06-02: 70 mg via INTRAVENOUS
  Administered 2019-06-02: 20 mg via INTRAVENOUS

## 2019-06-02 MED ORDER — MORPHINE SULFATE (PF) 4 MG/ML IV SOLN
2.0000 mg | INTRAVENOUS | Status: DC | PRN
  Filled 2019-06-02: qty 1

## 2019-06-02 MED ORDER — DIPHENHYDRAMINE HCL 12.5 MG/5ML PO ELIX: 12.5000 mg | ORAL_SOLUTION | Freq: Four times a day (QID) | ORAL | Status: DC | PRN

## 2019-06-02 MED ORDER — KETAMINE HCL 10 MG/ML IJ SOLN
INTRAMUSCULAR | Status: DC | PRN
  Administered 2019-06-02: 40 mg via INTRAVENOUS

## 2019-06-02 MED ORDER — HYDROMORPHONE HCL 1 MG/ML IJ SOLN
0.2500 mg | INTRAMUSCULAR | Status: DC | PRN
  Administered 2019-06-02 (×3): 0.25 mg via INTRAVENOUS

## 2019-06-02 MED ORDER — MAGNESIUM CITRATE PO SOLN: 1.0000 | Freq: Once | ORAL | Status: DC

## 2019-06-02 MED ORDER — LIDOCAINE 20MG/ML (2%) 15 ML SYRINGE OPTIME
INTRAMUSCULAR | Status: DC | PRN
  Administered 2019-06-02: 1.5 mg/kg/h via INTRAVENOUS

## 2019-06-02 MED ORDER — EPHEDRINE SULFATE 50 MG/ML IJ SOLN
INTRAMUSCULAR | Status: DC | PRN
  Administered 2019-06-02: 10 mg via INTRAVENOUS

## 2019-06-02 SURGICAL SUPPLY — 61 items
APPLICATOR COTTON TIP 6 STRL (MISCELLANEOUS) ×2 IMPLANT
APPLICATOR COTTON TIP 6IN STRL (MISCELLANEOUS) ×4
CATH FOLEY 2WAY SLVR 18FR 30CC (CATHETERS) ×4 IMPLANT
CATH ROBINSON RED A/P 16FR (CATHETERS) ×4 IMPLANT
CATH ROBINSON RED A/P 8FR (CATHETERS) ×4 IMPLANT
CATH TIEMANN FOLEY 18FR 5CC (CATHETERS) ×4 IMPLANT
CHLORAPREP W/TINT 26 (MISCELLANEOUS) ×4 IMPLANT
CLIP VESOLOCK LG 6/CT PURPLE (CLIP) ×12 IMPLANT
COVER SURGICAL LIGHT HANDLE (MISCELLANEOUS) ×4 IMPLANT
COVER TIP SHEARS 8 DVNC (MISCELLANEOUS) ×2 IMPLANT
COVER TIP SHEARS 8MM DA VINCI (MISCELLANEOUS) ×4
COVER WAND RF STERILE (DRAPES) IMPLANT
CUTTER ECHEON FLEX ENDO 45 340 (ENDOMECHANICALS) ×4 IMPLANT
DECANTER SPIKE VIAL GLASS SM (MISCELLANEOUS) ×4 IMPLANT
DERMABOND ADVANCED (GAUZE/BANDAGES/DRESSINGS) ×2
DERMABOND ADVANCED .7 DNX12 (GAUZE/BANDAGES/DRESSINGS) ×2 IMPLANT
DRAIN CHANNEL RND F F (WOUND CARE) IMPLANT
DRAPE ARM DVNC X/XI (DISPOSABLE) ×8 IMPLANT
DRAPE COLUMN DVNC XI (DISPOSABLE) ×2 IMPLANT
DRAPE DA VINCI XI ARM (DISPOSABLE) ×8
DRAPE DA VINCI XI COLUMN (DISPOSABLE) ×2
DRAPE SURG IRRIG POUCH 19X23 (DRAPES) ×4 IMPLANT
DRSG TEGADERM 4X4.75 (GAUZE/BANDAGES/DRESSINGS) ×4 IMPLANT
ELECT PENCIL ROCKER SW 15FT (MISCELLANEOUS) ×4 IMPLANT
ELECT REM PT RETURN 15FT ADLT (MISCELLANEOUS) ×4 IMPLANT
GLOVE BIO SURGEON STRL SZ 6.5 (GLOVE) ×3 IMPLANT
GLOVE BIO SURGEONS STRL SZ 6.5 (GLOVE) ×1
GLOVE BIOGEL M STRL SZ7.5 (GLOVE) ×8 IMPLANT
GOWN STRL REUS W/TWL LRG LVL3 (GOWN DISPOSABLE) ×12 IMPLANT
HOLDER FOLEY CATH W/STRAP (MISCELLANEOUS) ×4 IMPLANT
IRRIG SUCT STRYKERFLOW 2 WTIP (MISCELLANEOUS) ×4
IRRIGATION SUCT STRKRFLW 2 WTP (MISCELLANEOUS) ×2 IMPLANT
IV LACTATED RINGERS 1000ML (IV SOLUTION) ×4 IMPLANT
KIT TURNOVER KIT A (KITS) IMPLANT
NDL SAFETY ECLIPSE 18X1.5 (NEEDLE) ×2 IMPLANT
NEEDLE HYPO 18GX1.5 SHARP (NEEDLE) ×3
PACK ROBOT UROLOGY CUSTOM (CUSTOM PROCEDURE TRAY) ×4 IMPLANT
PENCIL SMOKE EVACUATOR (MISCELLANEOUS) IMPLANT
SEAL CANN UNIV 5-8 DVNC XI (MISCELLANEOUS) ×8 IMPLANT
SEAL XI 5MM-8MM UNIVERSAL (MISCELLANEOUS) ×8
SET TUBE SMOKE EVAC HIGH FLOW (TUBING) ×4 IMPLANT
SOLUTION ELECTROLUBE (MISCELLANEOUS) ×4 IMPLANT
STAPLE RELOAD 45 GRN (STAPLE) ×2 IMPLANT
STAPLE RELOAD 45MM GREEN (STAPLE) ×2
SUT ETHILON 3 0 PS 1 (SUTURE) ×4 IMPLANT
SUT MNCRL 3 0 RB1 (SUTURE) ×4 IMPLANT
SUT MNCRL 3 0 VIOLET RB1 (SUTURE) ×2 IMPLANT
SUT MNCRL AB 4-0 PS2 18 (SUTURE) ×8 IMPLANT
SUT MONOCRYL 3 0 RB1 (SUTURE) ×6
SUT VIC AB 0 CT1 27 (SUTURE) ×2
SUT VIC AB 0 CT1 27XBRD ANTBC (SUTURE) ×2 IMPLANT
SUT VIC AB 0 UR5 27 (SUTURE) ×4 IMPLANT
SUT VIC AB 2-0 SH 27 (SUTURE) ×2
SUT VIC AB 2-0 SH 27X BRD (SUTURE) ×2 IMPLANT
SUT VIC AB 3-0 SH 27 (SUTURE) ×2
SUT VIC AB 3-0 SH 27XBRD (SUTURE) ×2 IMPLANT
SUT VICRYL 0 UR6 27IN ABS (SUTURE) ×8 IMPLANT
SYR 27GX1/2 1ML LL SAFETY (SYRINGE) ×4 IMPLANT
TOWEL OR NON WOVEN STRL DISP B (DISPOSABLE) ×4 IMPLANT
TROCAR XCEL NON-BLD 5MMX100MML (ENDOMECHANICALS) IMPLANT
WATER STERILE IRR 1000ML POUR (IV SOLUTION) ×4 IMPLANT

## 2019-06-02 NOTE — Anesthesia Procedure Notes (Signed)
Procedure Name: Intubation Date/Time: 06/02/2019 7:24 AM Performed by: Glory Buff, CRNA Pre-anesthesia Checklist: Patient identified, Emergency Drugs available, Suction available and Patient being monitored Patient Re-evaluated:Patient Re-evaluated prior to induction Oxygen Delivery Method: Circle system utilized Preoxygenation: Pre-oxygenation with 100% oxygen Induction Type: IV induction Ventilation: Mask ventilation without difficulty Laryngoscope Size: Faubert and 3 Grade View: Grade I Tube type: Oral Tube size: 7.5 mm Number of attempts: 1 Airway Equipment and Method: Stylet and Oral airway Placement Confirmation: ETT inserted through vocal cords under direct vision,  positive ETCO2 and breath sounds checked- equal and bilateral Secured at: 22 cm Tube secured with: Tape Dental Injury: Teeth and Oropharynx as per pre-operative assessment

## 2019-06-02 NOTE — Anesthesia Preprocedure Evaluation (Addendum)
Anesthesia Evaluation  Patient identified by MRN, date of birth, ID band Patient awake    Reviewed: Allergy & Precautions, NPO status , Patient's Chart, lab work & pertinent test results  History of Anesthesia Complications (+) PONV  Airway Mallampati: II  TM Distance: >3 FB Neck ROM: Full    Dental no notable dental hx.    Pulmonary neg pulmonary ROS,    Pulmonary exam normal breath sounds clear to auscultation       Cardiovascular Normal cardiovascular exam+ dysrhythmias Atrial Fibrillation  Rhythm:Regular Rate:Normal     Neuro/Psych negative neurological ROS  negative psych ROS   GI/Hepatic negative GI ROS, Neg liver ROS,   Endo/Other  negative endocrine ROS  Renal/GU negative Renal ROS  negative genitourinary   Musculoskeletal negative musculoskeletal ROS (+)   Abdominal   Peds negative pediatric ROS (+)  Hematology negative hematology ROS (+)   Anesthesia Other Findings   Reproductive/Obstetrics negative OB ROS                             Anesthesia Physical Anesthesia Plan  ASA: III  Anesthesia Plan: General   Post-op Pain Management:    Induction: Intravenous  PONV Risk Score and Plan: 3 and Ondansetron, Dexamethasone, Treatment may vary due to age or medical condition and Droperidol  Airway Management Planned: Oral ETT  Additional Equipment:   Intra-op Plan:   Post-operative Plan: Extubation in OR  Informed Consent: I have reviewed the patients History and Physical, chart, labs and discussed the procedure including the risks, benefits and alternatives for the proposed anesthesia with the patient or authorized representative who has indicated his/her understanding and acceptance.     Dental advisory given  Plan Discussed with: CRNA and Surgeon  Anesthesia Plan Comments:         Anesthesia Quick Evaluation

## 2019-06-02 NOTE — Progress Notes (Signed)
Patient ID: Raymond Ray, male   DOB: 1950/06/16, 69 y.o.   MRN: [id number] Post-op note  Subjective: The patient is doing well.  No complaints except urgency and bladder spasms.  Denies N/V.  Has not ambulated yet.   Objective: Vital signs in last 24 hours: Temp:  [96.8 F (36 C)-98.3 F (36.8 C)] 97.7 F (36.5 C) (04/08 1458) Pulse Rate:  [49-121] 77 (04/08 1458) Resp:  [10-23] 16 (04/08 1400) BP: (101-123)/(63-93) 120/79 (04/08 1458) SpO2:  [96 %-100 %] 100 % (04/08 1458) Weight:  [80.3 kg] 80.3 kg (04/08 1458)  Intake/Output from previous day: No intake/output data recorded. Intake/Output this shift: Total I/O In: 2432 [P.O.:120; I.V.:2212; IV Piggyback:100] Out: 300 [Urine:90; Drains:60; Blood:150]  Physical Exam:  General: Alert and oriented. Abdomen: Soft, Nondistended. Incisions: Clean and dry. Urine: red  Lab Results: Recent Labs    06/02/19 1138  HGB 10.5*  HCT 32.6*    Assessment/Plan: POD#0   1) Continue to monitor  2) DVT prophy, clears, IS, amb, pain control  3) B/O for spasms   LOS: 0 days   Debbrah Alar 06/02/2019, 3:16 PM

## 2019-06-02 NOTE — Transfer of Care (Signed)
Immediate Anesthesia Transfer of Care Note  Patient: Barrick Gleason  Procedure(s) Performed: XI ROBOTIC ASSISTED LAPAROSCOPIC RADICAL PROSTATECTOMY LEVEL 2 (N/A ) LYMPHADENECTOMY, PELVIC (Bilateral )  Patient Location: PACU  Anesthesia Type:General  Level of Consciousness: drowsy, patient cooperative and responds to stimulation  Airway & Oxygen Therapy: Patient Spontanous Breathing and Patient connected to face mask oxygen  Post-op Assessment: Report given to RN and Post -op Vital signs reviewed and stable  Post vital signs: Reviewed and stable  Last Vitals:  Vitals Value Taken Time  BP 110/82 06/02/19 1130  Temp    Pulse 120 06/02/19 1131  Resp 23 06/02/19 1128  SpO2 100 % 06/02/19 1131  Vitals shown include unvalidated device data.  Last Pain:  Vitals:   06/02/19 0545  TempSrc: Oral         Complications: No apparent anesthesia complications

## 2019-06-02 NOTE — Op Note (Signed)

## 2019-06-03 ENCOUNTER — Encounter: Payer: Self-pay | Admitting: *Deleted

## 2019-06-03 DIAGNOSIS — G8929 Other chronic pain: Secondary | ICD-10-CM | POA: Diagnosis not present

## 2019-06-03 DIAGNOSIS — C61 Malignant neoplasm of prostate: Secondary | ICD-10-CM | POA: Diagnosis not present

## 2019-06-03 DIAGNOSIS — Z79899 Other long term (current) drug therapy: Secondary | ICD-10-CM | POA: Diagnosis not present

## 2019-06-03 DIAGNOSIS — I4891 Unspecified atrial fibrillation: Secondary | ICD-10-CM | POA: Diagnosis not present

## 2019-06-03 DIAGNOSIS — N5201 Erectile dysfunction due to arterial insufficiency: Secondary | ICD-10-CM | POA: Diagnosis not present

## 2019-06-03 DIAGNOSIS — Z7901 Long term (current) use of anticoagulants: Secondary | ICD-10-CM | POA: Diagnosis not present

## 2019-06-03 LAB — HEMOGLOBIN AND HEMATOCRIT, BLOOD
HCT: 27.7 % — ABNORMAL LOW (ref 39.0–52.0)
HCT: 33.1 % — ABNORMAL LOW (ref 39.0–52.0)
Hemoglobin: 9.2 g/dL — ABNORMAL LOW (ref 13.0–17.0)
Hemoglobin: 10.9 g/dL — ABNORMAL LOW (ref 13.0–17.0)

## 2019-06-03 MED ORDER — OXYCODONE-ACETAMINOPHEN 5-325 MG PO TABS
1.0000 | ORAL_TABLET | Freq: Four times a day (QID) | ORAL | Status: DC | PRN
  Administered 2019-06-03: 1 via ORAL
  Filled 2019-06-03: qty 1

## 2019-06-03 MED ORDER — BISACODYL 10 MG RE SUPP
10.0000 mg | Freq: Once | RECTAL | Status: DC
  Filled 2019-06-03: qty 1

## 2019-06-03 MED ORDER — CHLORHEXIDINE GLUCONATE CLOTH 2 % EX PADS: 6.0000 | MEDICATED_PAD | Freq: Every day | CUTANEOUS | Status: DC

## 2019-06-03 NOTE — Progress Notes (Signed)
JP drain to Left lower quadrant removed as ordered. No complications noted. Dry gauze and tape applied to surgical site. No pain voiced at this time. VSS. Walking hallway with wife at bedside. Will cont to mx.

## 2019-06-03 NOTE — Discharge Instructions (Signed)
1. Activity:  You are encouraged to ambulate frequently (about every hour during waking hours) to help prevent blood clots from forming in your legs or lungs.  However, you should not engage in any heavy lifting (> 10-15 lbs), strenuous activity, or straining. °2. Diet: You should continue a clear liquid diet until passing gas from below.  Once this occurs, you may advance your diet to a soft diet that would be easy to digest (i.e soups, scrambled eggs, mashed potatoes, etc.) for 24 hours just as you would if getting over a bad stomach flu.  If tolerating this diet well for 24 hours, you may then begin eating regular food.  It will be normal to have some amount of bloating, nausea, and abdominal discomfort intermittently. °3. Prescriptions:  You will be provided a prescription for pain medication to take as needed.  If your pain is not severe enough to require the prescription pain medication, you may take Tylenol instead.  You should also take an over the counter stool softener (Colace 100 mg twice daily) to avoid straining with bowel movements as the pain medication may constipate you. Finally, you will also be provided a prescription for an antibiotic to begin the day prior to your return visit in the office for catheter removal. °4. Catheter care: You will be taught how to take care of the catheter by the nursing staff prior to discharge from the hospital.  You may use both a leg bag and the larger bedside bag but it is recommended to at least use the bigger bedside bag at nighttime as the leg bag is small and will fill up overnight and also does not drain as well when lying flat. You may periodically feel a strong urge to void with the catheter in place.  This is a bladder spasm and most often can occur when having a bowel movement or when you are moving around. It is typically self-limited and usually will stop after a few minutes.  You may use some Vaseline or Neosporin around the tip of the catheter to  reduce friction at the tip of the penis. °5. Incisions: You may remove your dressing bandages the 2nd day after surgery.  You most likely will have a few small staples in each of the incisions and once the bandages are removed, the incisions may stay open to air.  You may start showering (not soaking or bathing in water) 48 hours after surgery and the incisions simply need to be patted dry after the shower.  No additional care is needed. °6. What to call us about: You should call the office ([phone]) if you develop fever > 101, persistent vomiting, or the catheter stops draining. Also, feel free to call with any other questions you may have and remember the handout that was provided to you as a reference preoperatively which answers many of the common questions that arise after surgery. °7. You may resume aspirin, advil, aleve, vitamins, and supplements 7 days after surgery. ° °You may resume Eliquis 5 days after surgery. ° °

## 2019-06-03 NOTE — Anesthesia Postprocedure Evaluation (Signed)
Anesthesia Post Note  Patient: Raymond Ray  Procedure(s) Performed: XI ROBOTIC ASSISTED LAPAROSCOPIC RADICAL PROSTATECTOMY LEVEL 2 (N/A ) LYMPHADENECTOMY, PELVIC (Bilateral )     Patient location during evaluation: PACU Anesthesia Type: General Level of consciousness: awake and alert Pain management: pain level controlled Vital Signs Assessment: post-procedure vital signs reviewed and stable Respiratory status: spontaneous breathing, nonlabored ventilation, respiratory function stable and patient connected to nasal cannula oxygen Cardiovascular status: blood pressure returned to baseline and stable Postop Assessment: no apparent nausea or vomiting Anesthetic complications: no    Last Vitals:  Vitals:   06/03/19 0154 06/03/19 0513  BP:  95/66  Pulse: 77 78  Resp: 15 19  Temp:  36.7 C  SpO2:  98%    Last Pain:  Vitals:   06/03/19 0513  TempSrc: Oral  PainSc:                  Pauline Trainer S

## 2019-06-03 NOTE — Progress Notes (Signed)
Patient ID: Raymond Ray, male   DOB: 04-22-1950, 69 y.o.   MRN: [id number]  1 Day Post-Op Subjective: The patient is doing well.  No nausea or vomiting. Pain is adequately controlled.  Objective: Vital signs in last 24 hours: Temp:  [96.8 F (36 C)-99 F (37.2 C)] 98 F (36.7 C) (04/09 0513) Pulse Rate:  [49-121] 78 (04/09 0513) Resp:  [10-23] 19 (04/09 0513) BP: (95-123)/(60-93) 95/66 (04/09 0513) SpO2:  [96 %-100 %] 98 % (04/09 0513) Weight:  [80.3 kg] 80.3 kg (04/08 1458)  Intake/Output from previous day: 04/08 0701 - 04/09 0700 In: 4423.6 [P.O.:600; I.V.:3623.6; IV Piggyback:200] Out: [id number] [Urine:750; Drains:171; Blood:150] Intake/Output this shift: No intake/output data recorded.  Physical Exam:  General: Alert and oriented. CV: RRR Lungs: Clear bilaterally. GI: Soft, Nondistended. Incisions: Clean, dry, and intact Urine: Clear Extremities: Nontender, no erythema, no edema.  Lab Results: Recent Labs    06/02/19 1138 06/03/19 0508  HGB 10.5* 9.2*  HCT 32.6* 27.7*      Assessment/Plan: POD# 1 s/p robotic prostatectomy.  1) SL IVF 2) Ambulate, Incentive spirometry 3) Transition to oral pain medication 4) Dulcolax suppository 5) D/C pelvic drain 6) Pt does have chronic anemia but will recheck Hgb to ensure no significant drop to suggest any postop bleeding prior to discharge 7) Plan for likely discharge later today   Pryor Curia. MD   LOS: 0 days   Dutch Gray 06/03/2019, 7:03 AM

## 2019-06-03 NOTE — Progress Notes (Signed)
Discharge instructions and medications reviewed. VSS. No questions or concerns at this time. Ready for discharge home.

## 2019-06-03 NOTE — Discharge Summary (Signed)
  Date of admission: 06/02/2019  Date of discharge: 06/03/2019  Admission diagnosis: Prostate Cancer  Discharge diagnosis: Prostate Cancer  History and Physical: For full details, please see admission history and physical. Briefly, Raymond Ray is a 69 y.o. gentleman with localized prostate cancer.  After discussing management/treatment options, he elected to proceed with surgical treatment.  Hospital Course: Raymond Ray was taken to the operating room on 06/02/2019 and underwent a robotic assisted laparoscopic radical prostatectomy. He tolerated this procedure well and without complications. Postoperatively, he was able to be transferred to a regular hospital room following recovery from anesthesia.  He was able to begin ambulating the night of surgery. He remained hemodynamically stable overnight.  He had excellent urine output with appropriately minimal output from his pelvic drain and his pelvic drain was removed on POD #1.  He was transitioned to oral pain medication, tolerated a clear liquid diet, and had met all discharge criteria and was able to be discharged home later on POD#1.  Laboratory values:  Recent Labs    06/02/19 1138 06/03/19 0508 06/03/19 1108  HGB 10.5* 9.2* 10.9*  HCT 32.6* 27.7* 33.1*    Disposition: Home  Discharge instruction: He was instructed to be ambulatory but to refrain from heavy lifting, strenuous activity, or driving. He was instructed on urethral catheter care.  Discharge medications:   Allergies as of 06/03/2019   No Known Allergies     Medication List    STOP taking these medications   B-12 500 MCG Tabs   Eliquis 5 MG Tabs tablet Generic drug: apixaban   multivitamin tablet   Vitamin C 500 MG Caps     TAKE these medications   metoprolol tartrate 50 MG tablet Commonly known as: LOPRESSOR TAKE 1/2 TABLET BY MOUTH EVERY DAY   oxyCODONE-acetaminophen 10-325 MG tablet Commonly known as: PERCOCET Take 0.5 tablets by mouth every 4 (four)  hours as needed for pain. What changed: when to take this   polyethylene glycol 17 g packet Commonly known as: MIRALAX / GLYCOLAX Take 17 g by mouth daily.   sildenafil 100 MG tablet Commonly known as: VIAGRA TAKE 1/2 TO 1  (100 MG DOSE) BY MOUTH DAILY AS NEEDED FOR ERECTILE DYSFUNCTION.   sulfamethoxazole-trimethoprim 800-160 MG tablet Commonly known as: BACTRIM DS Take 1 tablet by mouth 2 (two) times daily. Start the day prior to foley removal appointment   zolpidem 10 MG tablet Commonly known as: AMBIEN Take 10 mg by mouth at bedtime.       Followup: He will followup in 1 week for catheter removal and to discuss his surgical pathology results.

## 2019-06-07 ENCOUNTER — Encounter: Payer: Self-pay | Admitting: Medical Oncology

## 2019-06-09 LAB — SURGICAL PATHOLOGY

## 2019-06-29 DIAGNOSIS — M62838 Other muscle spasm: Secondary | ICD-10-CM | POA: Diagnosis not present

## 2019-06-29 DIAGNOSIS — N393 Stress incontinence (female) (male): Secondary | ICD-10-CM | POA: Diagnosis not present

## 2019-06-29 DIAGNOSIS — M6281 Muscle weakness (generalized): Secondary | ICD-10-CM | POA: Diagnosis not present

## 2019-07-12 ENCOUNTER — Telehealth: Payer: Self-pay | Admitting: Cardiovascular Disease

## 2019-07-12 NOTE — Telephone Encounter (Signed)
     I went in opt's chart to see who called him today

## 2019-07-13 DIAGNOSIS — N393 Stress incontinence (female) (male): Secondary | ICD-10-CM | POA: Diagnosis not present

## 2019-07-13 DIAGNOSIS — M62838 Other muscle spasm: Secondary | ICD-10-CM | POA: Diagnosis not present

## 2019-07-13 DIAGNOSIS — M6281 Muscle weakness (generalized): Secondary | ICD-10-CM | POA: Diagnosis not present

## 2019-08-01 ENCOUNTER — Ambulatory Visit (HOSPITAL_COMMUNITY): Payer: Medicare Other | Attending: Cardiovascular Disease

## 2019-08-01 ENCOUNTER — Other Ambulatory Visit: Payer: Self-pay

## 2019-08-01 DIAGNOSIS — I4819 Other persistent atrial fibrillation: Secondary | ICD-10-CM

## 2019-08-01 DIAGNOSIS — I340 Nonrheumatic mitral (valve) insufficiency: Secondary | ICD-10-CM

## 2019-08-04 ENCOUNTER — Telehealth: Payer: Self-pay | Admitting: *Deleted

## 2019-08-04 NOTE — Telephone Encounter (Signed)
-----   Message from [name], MD sent at 08/03/2019  1:50 PM EDT ----- It seems that his mitral valve is leaking more.  I would like for him to see Dr. Burt Knack who specializes in valvular disease, to see if we need to consider fixing the valve or continue to monitor.

## 2019-08-04 NOTE — Telephone Encounter (Signed)
Advised patient Message sent to [place] RN to get scheduled

## 2019-08-09 ENCOUNTER — Encounter: Payer: Self-pay | Admitting: Medical Oncology

## 2019-08-09 ENCOUNTER — Encounter: Payer: Self-pay | Admitting: Internal Medicine

## 2019-08-09 NOTE — Progress Notes (Signed)
Patient has given me permission to review his chart as it relates to his cardiac condition

## 2019-08-11 ENCOUNTER — Ambulatory Visit (INDEPENDENT_AMBULATORY_CARE_PROVIDER_SITE_OTHER): Payer: Medicare Other | Admitting: Cardiovascular Disease

## 2019-08-11 ENCOUNTER — Other Ambulatory Visit: Payer: Self-pay

## 2019-08-11 ENCOUNTER — Encounter: Payer: Self-pay | Admitting: Cardiovascular Disease

## 2019-08-11 VITALS — BP 86/50 | HR 83 | Ht 75.0 in | Wt 177.0 lb

## 2019-08-11 DIAGNOSIS — R0602 Shortness of breath: Secondary | ICD-10-CM | POA: Diagnosis not present

## 2019-08-11 DIAGNOSIS — I340 Nonrheumatic mitral (valve) insufficiency: Secondary | ICD-10-CM | POA: Diagnosis not present

## 2019-08-11 MED ORDER — DIAZEPAM 5 MG PO TABS: ORAL_TABLET | ORAL | 0 refills | Status: DC

## 2019-08-11 NOTE — H&P (View-Only) (Signed)
Cardiology Office Note:    Date:  08/11/2019   ID:  Raymond Ray, DOB 1950/09/05, MRN [id number]  PCP:  Crist Infante, MD  [facility] Cardiologist:  [name], MD  [place] Electrophysiologist:  None   Referring MD: Crist Infante, MD   Chief Complaint  Patient presents with  . Shortness of Breath    History of Present Illness:    Raymond Ray is a 69 y.o. male presenting for evaluation of mitral regurgitation, referred by Dr Oval Linsey.   He is here with his wife today. He has followed in our practice for many years by Dr Mare Ferrari and with Dr Oval Linsey since his retirement. He has longstanding permanent atrial fibrillation. States he was first diagnosed with AFib in the 1970's and was treated with quinidine at that time. He was on warfarin for many years and transitioned to apixaban several years ago. He has known about having 'mitral valve prolapse' for decades as well. He doesn't recall having mitral regurgitation or being told of a heart murmur until more recently.   Going back over his echo studies, he had an echo in 2011 demonstrating normal LV size, LVEF 35-59%, and holosystolic mitral valve prolapse with moderate mitral regurgitation. In January 2020 an echo showed an LVEF of 60-65%, an mild-moderate MR with severe biatrial dilatation, estimated PASP 35 mmHg, LVESD 27 mm.   The patient has prostate CA and had a total prostatectomy via robotic approach in April of this year. He has had a good recovery and is back to most of his normal activities. He complains of generalized fatigue, especially near the end of the day. He is walking about one mile several times per week without significant limitation. He also walks on an elliptical. He complains of shortness of breath with walking up a hill but has felt this is because of poor aerobic conditioning. He also is winded at times when walking up stairs. No orthopnea or PND. Occasional swelling of the feet at night.     Past Medical History:  Diagnosis Date  . Allergy   . Chronic atrial fibrillation ([place])   . History of colonoscopy 04/2001   negative  . Mitral valve prolapse   . Osteoarthritis of hip   . PONV (postoperative nausea and vomiting)    left knee cartilage removal;    . Prostate cancer ([place])   . Vertigo 01/07/2017   currently not symptomatic     Past Surgical History:  Procedure Laterality Date  . COLONOSCOPY    . KNEE SURGERY     left at age 3 in [place]  . LYMPHADENECTOMY Bilateral 06/02/2019   Procedure: LYMPHADENECTOMY, PELVIC;  Surgeon: [name], MD;  Location: [facility];  Service: Urology;  Laterality: Bilateral;  . PROSTATE BIOPSY    . ROBOT ASSISTED LAPAROSCOPIC RADICAL PROSTATECTOMY N/A 06/02/2019   Procedure: XI ROBOTIC ASSISTED LAPAROSCOPIC RADICAL PROSTATECTOMY LEVEL 2;  Surgeon: [name], MD;  Location: [facility];  Service: Urology;  Laterality: N/A;  . spinal injection     December 2013  . TOTAL HIP ARTHROPLASTY Right 01/29/2017   Procedure: RIGHT TOTAL HIP ARTHROPLASTY ANTERIOR APPROACH;  Surgeon: Rod Can, MD;  Location: [facility];  Service: Orthopedics;  Laterality: Right;  Marland Kitchen VASECTOMY      Current Medications: Current Meds  Medication Sig  . apixaban (ELIQUIS) 5 MG TABS tablet Eliquis 5 mg tablet  TAKE 1 TABLET BY MOUTH TWICE A DAY  . metoprolol tartrate (LOPRESSOR) 50 MG tablet TAKE 1/2  TABLET BY MOUTH EVERY DAY (Patient taking differently: Take 25 mg by mouth daily. )  . polyethylene glycol (MIRALAX / GLYCOLAX) 17 g packet Take 17 g by mouth daily.  . sildenafil (VIAGRA) 100 MG tablet TAKE 1/2 TO 1  (100 MG DOSE) BY MOUTH DAILY AS NEEDED FOR ERECTILE DYSFUNCTION.  Marland Kitchen sulfamethoxazole-trimethoprim (BACTRIM DS) 800-160 MG tablet Take 1 tablet by mouth 2 (two) times daily. Start the day prior to foley removal appointment  . zolpidem (AMBIEN) 10 MG tablet Take 10 mg by mouth at bedtime.     Allergies:   Patient has no known allergies.   Social  History   Socioeconomic History  . Marital status: Married    Spouse name: Raymond Ray  . Number of children: 2  . Years of education: Not on file  . Highest education level: Not on file  Occupational History  . Not on file  Tobacco Use  . Smoking status: Never Smoker  . Smokeless tobacco: Never Used  Vaping Use  . Vaping Use: Never used  Substance and Sexual Activity  . Alcohol use: No  . Drug use: No  . Sexual activity: Yes  Other Topics Concern  . Not on file  Social History Narrative   Right handed   Lives in two story home with wife   Daughter, Raymond Ray, resides in [place]. 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 Daughter, Raymond Ray, resides in [place]. 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    Social Determinants of Health   Financial Resource Strain:   . Difficulty of Paying Living Expenses:   Food Insecurity:   . Worried About Charity fundraiser in the Last Year:   . Arboriculturist in the Last Year:   Transportation Needs:   . Film/video editor (Medical):   Marland Kitchen Lack of Transportation (Non-Medical):   Physical Activity:   . Days of Exercise per Week:   . Minutes of Exercise per Session:   Stress:   . Feeling of Stress :   Social Connections:   . Frequency of Communication with Friends and Family:   . Frequency of Social Gatherings with Friends and Family:   . Attends Religious Services:   . Active Member of Clubs or Organizations:   . Attends Archivist Meetings:   Marland Kitchen Marital Status:      Family History: The patient's family history includes Arrhythmia in his father; Breast cancer in his paternal aunt and sister; Cancer in his mother; Diabetes in his mother; Heart attack in his mother. There is no history of Colon cancer, Esophageal cancer, Stomach cancer, or Rectal cancer.  ROS:   Please see the history of present illness.    All other systems reviewed and are negative.  EKGs/Labs/Other Studies Reviewed:    The following studies were reviewed  today: Echo: IMPRESSIONS    1. Left ventricular ejection fraction, by estimation, is 50 to 55%. The  left ventricle has low normal function. The left ventricle has no regional  wall motion abnormalities. Left ventricular diastolic function could not  be evaluated.  2. Right ventricular systolic function is normal. The right ventricular  size is normal. There is moderately elevated pulmonary artery systolic  pressure.  3. Left atrial size was severely dilated.  4. Right atrial size was severely dilated.  5. The mitral valve is myxomatous. Moderate to severe mitral valve  regurgitation.  6. Tricuspid valve regurgitation is mild to moderate.  7. The aortic valve is normal in structure. Aortic  valve regurgitation is  not visualized. No aortic stenosis is present.  8. Aortic dilatation noted. 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   FINDINGS  Left Ventricle: Left ventricular ejection fraction, by estimation, is 50  to 55%. The left ventricle has low normal function. The left ventricle has  no regional wall motion abnormalities. The left ventricular internal  cavity size was normal in size.  There is borderline left ventricular hypertrophy. Left ventricular  diastolic function could not be evaluated due to atrial fibrillation. Left  ventricular diastolic function could not be evaluated.   Right Ventricle: The right ventricular size is normal. No increase in  right ventricular wall thickness. Right ventricular systolic function is  normal. There is moderately elevated pulmonary artery systolic pressure.  The tricuspid regurgitant velocity is  3.05 m/s, and with an assumed right atrial pressure of 10 mmHg, the  estimated right ventricular systolic pressure is 16.1 mmHg.   Left Atrium: Left atrial size was severely dilated.   Right Atrium: Right atrial size was severely dilated.   Pericardium: There is no evidence of pericardial effusion.    Mitral Valve: The mitral valve is myxomatous. There is mild late systolic  prolapse of of the mitral valve. Moderate to severe mitral valve  regurgitation.   Tricuspid Valve: The tricuspid valve is normal in structure. Tricuspid  valve regurgitation is mild to moderate. No evidence of tricuspid  stenosis.   Aortic Valve: The aortic valve is normal in structure. Aortic valve  regurgitation is not visualized. No aortic stenosis is present.   Pulmonic Valve: The pulmonic valve was normal in structure. Pulmonic valve  regurgitation is trivial. No evidence of pulmonic stenosis.   Aorta: Aortic dilatation noted. 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   IAS/Shunts: The atrial septum is grossly normal.     LEFT VENTRICLE  PLAX 2D  LVIDd:     4.50 cm  LVIDs:     2.70 cm  LV PW:     1.30 cm  LV IVS:    1.00 cm  LVOT diam:   2.25 cm  LV SV:     65  LV SV Index:  31  LVOT Area:   3.98 cm     RIGHT VENTRICLE  RV Basal diam: 4.00 cm  RV S prime:   11.54 cm/s  TAPSE (M-mode): 1.1 cm  RVSP:      45.2 mmHg   LEFT ATRIUM       Index    RIGHT ATRIUM      Index  LA diam:    5.30 cm 2.54 cm/m RA Pressure: 8.00 mmHg  LA Vol (A2C):  142.0 ml 68.15 ml/m RA Area:   29.90 cm  LA Vol (A4C):  84.4 ml 40.51 ml/m RA Volume:  109.00 ml 52.32 ml/m  LA Biplane Vol: 109.0 ml 52.32 ml/m  AORTIC VALVE  LVOT Vmax:  89.33 cm/s  LVOT Vmean: 54.300 cm/s  LVOT VTI:  0.165 m    AORTA  Ao Root diam: 3.70 cm   MR Peak grad:  97.4 mmHg  TRICUSPID VALVE  MR Mean grad:  65.0 mmHg  TR Peak grad:  37.2 mmHg  MR Vmax:     493.50 cm/s TR Vmax:    305.00 cm/s  MR Vmean:    378.2 cm/s Estimated RAP: 8.00 mmHg  MR PISA:     6.28 cm  RVSP:      45.2 mmHg  MR PISA Eff ROA: 47 mm  MR PISA Radius: 1.00 cm   SHUNTS                Systemic VTI: 0.16 m                 Systemic Diam: 2.25 cm  EKG:  EKG is ordered today.  The ekg ordered today demonstrates atrial fibrillation 80 bpm  Recent Labs: 05/26/2019: BUN 26; Creatinine, Ser 1.28; Platelets 263; Potassium 4.6; Sodium 144 06/03/2019: Hemoglobin 10.9  Recent Lipid Panel    Component Value Date/Time   CHOL 166 03/03/2011 0922   TRIG 41.0 03/03/2011 0922   HDL 55.80 03/03/2011 0922   CHOLHDL 3 03/03/2011 0922   VLDL 8.2 03/03/2011 0922   LDLCALC 102 (H) 03/03/2011 0922    Physical Exam:    VS:  BP (!) 86/50   Pulse 83   Ht 6\' 3"  (1.905 m)   Wt 177 lb (80.3 kg)   SpO2 98%   BMI 22.12 kg/m     Wt Readings from Last 3 Encounters:  08/11/19 177 lb (80.3 kg)  06/02/19 177 lb 0.5 oz (80.3 kg)  05/26/19 175 lb 9 oz (79.6 kg)     GEN:  Well nourished, well developed thin male in no acute distress HEENT: Normal NECK: No JVD; No carotid bruits LYMPHATICS: No lymphadenopathy CARDIAC: irregularly irregular with a 3/6 mid-late systolic murmur at the apex RESPIRATORY:  Clear to auscultation without rales, wheezing or rhonchi  ABDOMEN: Soft, non-tender, non-distended MUSCULOSKELETAL:  1+ bilateral ankle edema; No deformity  SKIN: Warm and dry NEUROLOGIC:  Alert and oriented x 3 PSYCHIATRIC:  Normal affect   ASSESSMENT:    1. Nonrheumatic mitral valve regurgitation    PLAN:    In order of problems listed above:  This is a 69 year old male with longstanding history of mitral valve prolapse with mitral regurgitation as well as permanent atrial fibrillation, now with at least moderately severe mitral regurgitation and mild LV dysfunction, New York Heart Association functional class II symptoms of exertional dyspnea and fatigue.  The patient's echocardiogram is personally reviewed.  The left ventricle is not dilated but there is lower than expected LV systolic function in the setting of what appears to be at least 3+ mitral regurgitation.  There is severe biatrial enlargement.   There appears to be bileaflet mitral valve prolapse with moderately severe regurgitation.  I discussed the natural history of mitral regurgitation with the patient today.  We discussed considerations of ongoing medical therapy and close observation with echo surveillance versus surgical intervention.  The patient understands that there is more data needed to help with decision-making.  It is concerning that his LVEF is lower on the recent study that on his previous study 18 months ago.  He does appear to have mild functional limitation.  I have recommended further evaluation with a transesophageal echocardiogram.  This should help define the functional anatomy of the mitral valve as well as the severity of mitral regurgitation.  I reviewed the risks, indications, and alternatives to transesophageal echocardiogram.  The patient understands and agrees to proceed.  I have also recommended a cardiac MR study to better quantitate the patient's LV function and evaluate LV volume as well as regurgitant volumes.  We will check a BNP.  We will then discuss further evaluation with the patient and probable referral to Dr. Roxy Manns for surgical consultation.  All of the patient's questions are answered.   Medication Adjustments/Labs and Tests  Ordered: Current medicines are reviewed at length with the patient today.  Concerns regarding medicines are outlined above.  Orders Placed This Encounter  Procedures  . MR CARDIAC MORPHOLOGY W WO CONTRAST  . Basic metabolic panel  . CBC with Differential/Platelet  . Pro b natriuretic peptide (BNP)  . EKG 12-Lead   Meds ordered this encounter  Medications  . diazepam (VALIUM) 5 MG tablet    Sig: Take 5 mg 1 hour prior to your cardiac MRI. Bring the 2nd tablet with you to take if needed.    Dispense:  2 tablet    Refill:  0    Patient Instructions  Medication Instructions:  Your provider recommends that you continue on your current medications as directed. Please refer  to the Current Medication list given to you today.   *If you need a refill on your cardiac medications before your next appointment, please call your pharmacy*  Lab Work: TODAY: BMET, BNP If you have labs (blood work) drawn today and your tests are completely normal, you will receive your results only by: Marland Kitchen MyChart Message (if you have MyChart) OR . A paper copy in the mail If you have any lab test that is abnormal or we need to change your treatment, we will call you to review the results.  Testing/Procedures: Your physician has requested that you have a TEE. During a TEE, sound waves are used to create images of your heart. It provides your doctor with information about the size and shape of your heart and how well your heart's chambers and valves are working. In this test, a transducer is attached to the end of a flexible tube that's guided down your throat and into your esophagus (the tube leading from you mouth to your stomach) to get a more detailed image of your heart. You are not awake for the procedure. Please see the instruction sheet given to you today. For further information please visit [email or web address].  Dr. Burt Knack recommends you have a CARDIAC MRI.   Follow-Up: We will contact you after your testing for appropriate follow-up planning.    Signed, Sherren Mocha, MD  08/11/2019 3:18 PM    [place]

## 2019-08-11 NOTE — H&P (View-Only) (Signed)
Cardiology Office Note:    Date:  08/11/2019   ID:  Raymond Ray, DOB Nov 12, 1950, MRN [id number]  PCP:  Crist Infante, MD  [facility] Cardiologist:  [name], MD  [place] Electrophysiologist:  None   Referring MD: Crist Infante, MD   Chief Complaint  Patient presents with  . Shortness of Breath    History of Present Illness:    Raymond Ray is a 69 y.o. male presenting for evaluation of mitral regurgitation, referred by Dr Oval Linsey.   He is here with his wife today. He has followed in our practice for many years by Dr Mare Ferrari and with Dr Oval Linsey since his retirement. He has longstanding permanent atrial fibrillation. States he was first diagnosed with AFib in the 1970's and was treated with quinidine at that time. He was on warfarin for many years and transitioned to apixaban several years ago. He has known about having 'mitral valve prolapse' for decades as well. He doesn't recall having mitral regurgitation or being told of a heart murmur until more recently.   Going back over his echo studies, he had an echo in 2011 demonstrating normal LV size, LVEF 60-63%, and holosystolic mitral valve prolapse with moderate mitral regurgitation. In January 2020 an echo showed an LVEF of 60-65%, an mild-moderate MR with severe biatrial dilatation, estimated PASP 35 mmHg, LVESD 27 mm.   The patient has prostate CA and had a total prostatectomy via robotic approach in April of this year. He has had a good recovery and is back to most of his normal activities. He complains of generalized fatigue, especially near the end of the day. He is walking about one mile several times per week without significant limitation. He also walks on an elliptical. He complains of shortness of breath with walking up a hill but has felt this is because of poor aerobic conditioning. He also is winded at times when walking up stairs. No orthopnea or PND. Occasional swelling of the feet at night.     Past Medical History:  Diagnosis Date  . Allergy   . Chronic atrial fibrillation ([place])   . History of colonoscopy 04/2001   negative  . Mitral valve prolapse   . Osteoarthritis of hip   . PONV (postoperative nausea and vomiting)    left knee cartilage removal;    . Prostate cancer ([place])   . Vertigo 01/07/2017   currently not symptomatic     Past Surgical History:  Procedure Laterality Date  . COLONOSCOPY    . KNEE SURGERY     left at age 69 in [place]  . LYMPHADENECTOMY Bilateral 06/02/2019   Procedure: LYMPHADENECTOMY, PELVIC;  Surgeon: [name], MD;  Location: [facility];  Service: Urology;  Laterality: Bilateral;  . PROSTATE BIOPSY    . ROBOT ASSISTED LAPAROSCOPIC RADICAL PROSTATECTOMY N/A 06/02/2019   Procedure: XI ROBOTIC ASSISTED LAPAROSCOPIC RADICAL PROSTATECTOMY LEVEL 2;  Surgeon: [name], MD;  Location: [facility];  Service: Urology;  Laterality: N/A;  . spinal injection     December 2013  . TOTAL HIP ARTHROPLASTY Right 01/29/2017   Procedure: RIGHT TOTAL HIP ARTHROPLASTY ANTERIOR APPROACH;  Surgeon: Rod Can, MD;  Location: [facility];  Service: Orthopedics;  Laterality: Right;  Marland Kitchen VASECTOMY      Current Medications: Current Meds  Medication Sig  . apixaban (ELIQUIS) 5 MG TABS tablet Eliquis 5 mg tablet  TAKE 1 TABLET BY MOUTH TWICE A DAY  . metoprolol tartrate (LOPRESSOR) 50 MG tablet TAKE 1/2  TABLET BY MOUTH EVERY DAY (Patient taking differently: Take 25 mg by mouth daily. )  . polyethylene glycol (MIRALAX / GLYCOLAX) 17 g packet Take 17 g by mouth daily.  . sildenafil (VIAGRA) 100 MG tablet TAKE 1/2 TO 1  (100 MG DOSE) BY MOUTH DAILY AS NEEDED FOR ERECTILE DYSFUNCTION.  Marland Kitchen sulfamethoxazole-trimethoprim (BACTRIM DS) 800-160 MG tablet Take 1 tablet by mouth 2 (two) times daily. Start the day prior to foley removal appointment  . zolpidem (AMBIEN) 10 MG tablet Take 10 mg by mouth at bedtime.     Allergies:   Patient has no known allergies.   Social  History   Socioeconomic History  . Marital status: Married    Spouse name: Raymond Ray  . Number of children: 2  . Years of education: Not on file  . Highest education level: Not on file  Occupational History  . Not on file  Tobacco Use  . Smoking status: Never Smoker  . Smokeless tobacco: Never Used  Vaping Use  . Vaping Use: Never used  Substance and Sexual Activity  . Alcohol use: No  . Drug use: No  . Sexual activity: Yes  Other Topics Concern  . Not on file  Social History Narrative   Right handed   Lives in two story home with wife   Daughter, [name], resides in [place]. 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 Daughter, [name], resides in [place]. 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    Social Determinants of Health   Financial Resource Strain:   . Difficulty of Paying Living Expenses:   Food Insecurity:   . Worried About Charity fundraiser in the Last Year:   . Arboriculturist in the Last Year:   Transportation Needs:   . Film/video editor (Medical):   Marland Kitchen Lack of Transportation (Non-Medical):   Physical Activity:   . Days of Exercise per Week:   . Minutes of Exercise per Session:   Stress:   . Feeling of Stress :   Social Connections:   . Frequency of Communication with Friends and Family:   . Frequency of Social Gatherings with Friends and Family:   . Attends Religious Services:   . Active Member of Clubs or Organizations:   . Attends Archivist Meetings:   Marland Kitchen Marital Status:      Family History: The patient's family history includes Arrhythmia in his father; Breast cancer in his paternal aunt and sister; Cancer in his mother; Diabetes in his mother; Heart attack in his mother. There is no history of Colon cancer, Esophageal cancer, Stomach cancer, or Rectal cancer.  ROS:   Please see the history of present illness.    All other systems reviewed and are negative.  EKGs/Labs/Other Studies Reviewed:    The following studies were reviewed  today: Echo: IMPRESSIONS    1. Left ventricular ejection fraction, by estimation, is 50 to 55%. The  left ventricle has low normal function. The left ventricle has no regional  wall motion abnormalities. Left ventricular diastolic function could not  be evaluated.  2. Right ventricular systolic function is normal. The right ventricular  size is normal. There is moderately elevated pulmonary artery systolic  pressure.  3. Left atrial size was severely dilated.  4. Right atrial size was severely dilated.  5. The mitral valve is myxomatous. Moderate to severe mitral valve  regurgitation.  6. Tricuspid valve regurgitation is mild to moderate.  7. The aortic valve is normal in structure. Aortic  valve regurgitation is  not visualized. No aortic stenosis is present.  8. Aortic dilatation noted. 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   FINDINGS  Left Ventricle: Left ventricular ejection fraction, by estimation, is 50  to 55%. The left ventricle has low normal function. The left ventricle has  no regional wall motion abnormalities. The left ventricular internal  cavity size was normal in size.  There is borderline left ventricular hypertrophy. Left ventricular  diastolic function could not be evaluated due to atrial fibrillation. Left  ventricular diastolic function could not be evaluated.   Right Ventricle: The right ventricular size is normal. No increase in  right ventricular wall thickness. Right ventricular systolic function is  normal. There is moderately elevated pulmonary artery systolic pressure.  The tricuspid regurgitant velocity is  3.05 m/s, and with an assumed right atrial pressure of 10 mmHg, the  estimated right ventricular systolic pressure is 63.3 mmHg.   Left Atrium: Left atrial size was severely dilated.   Right Atrium: Right atrial size was severely dilated.   Pericardium: There is no evidence of pericardial effusion.    Mitral Valve: The mitral valve is myxomatous. There is mild late systolic  prolapse of of the mitral valve. Moderate to severe mitral valve  regurgitation.   Tricuspid Valve: The tricuspid valve is normal in structure. Tricuspid  valve regurgitation is mild to moderate. No evidence of tricuspid  stenosis.   Aortic Valve: The aortic valve is normal in structure. Aortic valve  regurgitation is not visualized. No aortic stenosis is present.   Pulmonic Valve: The pulmonic valve was normal in structure. Pulmonic valve  regurgitation is trivial. No evidence of pulmonic stenosis.   Aorta: Aortic dilatation noted. 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   IAS/Shunts: The atrial septum is grossly normal.     LEFT VENTRICLE  PLAX 2D  LVIDd:     4.50 cm  LVIDs:     2.70 cm  LV PW:     1.30 cm  LV IVS:    1.00 cm  LVOT diam:   2.25 cm  LV SV:     65  LV SV Index:  31  LVOT Area:   3.98 cm     RIGHT VENTRICLE  RV Basal diam: 4.00 cm  RV S prime:   11.54 cm/s  TAPSE (M-mode): 1.1 cm  RVSP:      45.2 mmHg   LEFT ATRIUM       Index    RIGHT ATRIUM      Index  LA diam:    5.30 cm 2.54 cm/m RA Pressure: 8.00 mmHg  LA Vol (A2C):  142.0 ml 68.15 ml/m RA Area:   29.90 cm  LA Vol (A4C):  84.4 ml 40.51 ml/m RA Volume:  109.00 ml 52.32 ml/m  LA Biplane Vol: 109.0 ml 52.32 ml/m  AORTIC VALVE  LVOT Vmax:  89.33 cm/s  LVOT Vmean: 54.300 cm/s  LVOT VTI:  0.165 m    AORTA  Ao Root diam: 3.70 cm   MR Peak grad:  97.4 mmHg  TRICUSPID VALVE  MR Mean grad:  65.0 mmHg  TR Peak grad:  37.2 mmHg  MR Vmax:     493.50 cm/s TR Vmax:    305.00 cm/s  MR Vmean:    378.2 cm/s Estimated RAP: 8.00 mmHg  MR PISA:     6.28 cm  RVSP:      45.2 mmHg  MR PISA Eff ROA: 47 mm  MR PISA Radius: 1.00 cm   SHUNTS                Systemic VTI: 0.16 m                 Systemic Diam: 2.25 cm  EKG:  EKG is ordered today.  The ekg ordered today demonstrates atrial fibrillation 80 bpm  Recent Labs: 05/26/2019: BUN 26; Creatinine, Ser 1.28; Platelets 263; Potassium 4.6; Sodium 144 06/03/2019: Hemoglobin 10.9  Recent Lipid Panel    Component Value Date/Time   CHOL 166 03/03/2011 0922   TRIG 41.0 03/03/2011 0922   HDL 55.80 03/03/2011 0922   CHOLHDL 3 03/03/2011 0922   VLDL 8.2 03/03/2011 0922   LDLCALC 102 (H) 03/03/2011 0922    Physical Exam:    VS:  BP (!) 86/50   Pulse 83   Ht 6\' 3"  (1.905 m)   Wt 177 lb (80.3 kg)   SpO2 98%   BMI 22.12 kg/m     Wt Readings from Last 3 Encounters:  08/11/19 177 lb (80.3 kg)  06/02/19 177 lb 0.5 oz (80.3 kg)  05/26/19 175 lb 9 oz (79.6 kg)     GEN:  Well nourished, well developed thin male in no acute distress HEENT: Normal NECK: No JVD; No carotid bruits LYMPHATICS: No lymphadenopathy CARDIAC: irregularly irregular with a 3/6 mid-late systolic murmur at the apex RESPIRATORY:  Clear to auscultation without rales, wheezing or rhonchi  ABDOMEN: Soft, non-tender, non-distended MUSCULOSKELETAL:  1+ bilateral ankle edema; No deformity  SKIN: Warm and dry NEUROLOGIC:  Alert and oriented x 3 PSYCHIATRIC:  Normal affect   ASSESSMENT:    1. Nonrheumatic mitral valve regurgitation    PLAN:    In order of problems listed above:  This is a 69 year old male with longstanding history of mitral valve prolapse with mitral regurgitation as well as permanent atrial fibrillation, now with at least moderately severe mitral regurgitation and mild LV dysfunction, New York Heart Association functional class II symptoms of exertional dyspnea and fatigue.  The patient's echocardiogram is personally reviewed.  The left ventricle is not dilated but there is lower than expected LV systolic function in the setting of what appears to be at least 3+ mitral regurgitation.  There is severe biatrial enlargement.   There appears to be bileaflet mitral valve prolapse with moderately severe regurgitation.  I discussed the natural history of mitral regurgitation with the patient today.  We discussed considerations of ongoing medical therapy and close observation with echo surveillance versus surgical intervention.  The patient understands that there is more data needed to help with decision-making.  It is concerning that his LVEF is lower on the recent study that on his previous study 18 months ago.  He does appear to have mild functional limitation.  I have recommended further evaluation with a transesophageal echocardiogram.  This should help define the functional anatomy of the mitral valve as well as the severity of mitral regurgitation.  I reviewed the risks, indications, and alternatives to transesophageal echocardiogram.  The patient understands and agrees to proceed.  I have also recommended a cardiac MR study to better quantitate the patient's LV function and evaluate LV volume as well as regurgitant volumes.  We will check a BNP.  We will then discuss further evaluation with the patient and probable referral to Dr. Roxy Manns for surgical consultation.  All of the patient's questions are answered.   Medication Adjustments/Labs and Tests  Ordered: Current medicines are reviewed at length with the patient today.  Concerns regarding medicines are outlined above.  Orders Placed This Encounter  Procedures  . MR CARDIAC MORPHOLOGY W WO CONTRAST  . Basic metabolic panel  . CBC with Differential/Platelet  . Pro b natriuretic peptide (BNP)  . EKG 12-Lead   Meds ordered this encounter  Medications  . diazepam (VALIUM) 5 MG tablet    Sig: Take 5 mg 1 hour prior to your cardiac MRI. Bring the 2nd tablet with you to take if needed.    Dispense:  2 tablet    Refill:  0    Patient Instructions  Medication Instructions:  Your provider recommends that you continue on your current medications as directed. Please refer  to the Current Medication list given to you today.   *If you need a refill on your cardiac medications before your next appointment, please call your pharmacy*  Lab Work: TODAY: BMET, BNP If you have labs (blood work) drawn today and your tests are completely normal, you will receive your results only by: Marland Kitchen MyChart Message (if you have MyChart) OR . A paper copy in the mail If you have any lab test that is abnormal or we need to change your treatment, we will call you to review the results.  Testing/Procedures: Your physician has requested that you have a TEE. During a TEE, sound waves are used to create images of your heart. It provides your doctor with information about the size and shape of your heart and how well your heart's chambers and valves are working. In this test, a transducer is attached to the end of a flexible tube that's guided down your throat and into your esophagus (the tube leading from you mouth to your stomach) to get a more detailed image of your heart. You are not awake for the procedure. Please see the instruction sheet given to you today. For further information please visit [email or web address].  Dr. Burt Knack recommends you have a CARDIAC MRI.   Follow-Up: We will contact you after your testing for appropriate follow-up planning.    Signed, Sherren Mocha, MD  08/11/2019 3:18 PM    [place]

## 2019-08-11 NOTE — Patient Instructions (Signed)
Medication Instructions:  Your provider recommends that you continue on your current medications as directed. Please refer to the Current Medication list given to you today.   *If you need a refill on your cardiac medications before your next appointment, please call your pharmacy*  Lab Work: TODAY: BMET, BNP If you have labs (blood work) drawn today and your tests are completely normal, you will receive your results only by: Marland Kitchen MyChart Message (if you have MyChart) OR . A paper copy in the mail If you have any lab test that is abnormal or we need to change your treatment, we will call you to review the results.  Testing/Procedures: Your physician has requested that you have a TEE. During a TEE, sound waves are used to create images of your heart. It provides your doctor with information about the size and shape of your heart and how well your heart's chambers and valves are working. In this test, a transducer is attached to the end of a flexible tube that's guided down your throat and into your esophagus (the tube leading from you mouth to your stomach) to get a more detailed image of your heart. You are not awake for the procedure. Please see the instruction sheet given to you today. For further information please visit [email or web address].  Dr. Burt Knack recommends you have a CARDIAC MRI.   Follow-Up: We will contact you after your testing for appropriate follow-up planning.

## 2019-08-11 NOTE — Progress Notes (Signed)
Cardiology Office Note:    Date:  08/11/2019   ID:  Raymond Ray, DOB 20-Dec-1950, MRN [id number]  PCP:  Crist Infante, MD  [facility] Cardiologist:  [name], MD  [place] Electrophysiologist:  None   Referring MD: Crist Infante, MD   Chief Complaint  Patient presents with  . Shortness of Breath    History of Present Illness:    Raymond Ray is a 69 y.o. male presenting for evaluation of mitral regurgitation, referred by Dr Oval Linsey.   He is here with his wife today. He has followed in our practice for many years by Dr Mare Ferrari and with Dr Oval Linsey since his retirement. He has longstanding permanent atrial fibrillation. States he was first diagnosed with AFib in the 1970's and was treated with quinidine at that time. He was on warfarin for many years and transitioned to apixaban several years ago. He has known about having 'mitral valve prolapse' for decades as well. He doesn't recall having mitral regurgitation or being told of a heart murmur until more recently.   Going back over his echo studies, he had an echo in 2011 demonstrating normal LV size, LVEF 09-98%, and holosystolic mitral valve prolapse with moderate mitral regurgitation. In January 2020 an echo showed an LVEF of 60-65%, an mild-moderate MR with severe biatrial dilatation, estimated PASP 35 mmHg, LVESD 27 mm.   The patient has prostate CA and had a total prostatectomy via robotic approach in April of this year. He has had a good recovery and is back to most of his normal activities. He complains of generalized fatigue, especially near the end of the day. He is walking about one mile several times per week without significant limitation. He also walks on an elliptical. He complains of shortness of breath with walking up a hill but has felt this is because of poor aerobic conditioning. He also is winded at times when walking up stairs. No orthopnea or PND. Occasional swelling of the feet at night.     Past Medical History:  Diagnosis Date  . Allergy   . Chronic atrial fibrillation ([place])   . History of colonoscopy 04/2001   negative  . Mitral valve prolapse   . Osteoarthritis of hip   . PONV (postoperative nausea and vomiting)    left knee cartilage removal;    . Prostate cancer ([place])   . Vertigo 01/07/2017   currently not symptomatic     Past Surgical History:  Procedure Laterality Date  . COLONOSCOPY    . KNEE SURGERY     left at age 89 in [place]  . LYMPHADENECTOMY Bilateral 06/02/2019   Procedure: LYMPHADENECTOMY, PELVIC;  Surgeon: [name], MD;  Location: [facility];  Service: Urology;  Laterality: Bilateral;  . PROSTATE BIOPSY    . ROBOT ASSISTED LAPAROSCOPIC RADICAL PROSTATECTOMY N/A 06/02/2019   Procedure: XI ROBOTIC ASSISTED LAPAROSCOPIC RADICAL PROSTATECTOMY LEVEL 2;  Surgeon: [name], MD;  Location: [facility];  Service: Urology;  Laterality: N/A;  . spinal injection     December 2013  . TOTAL HIP ARTHROPLASTY Right 01/29/2017   Procedure: RIGHT TOTAL HIP ARTHROPLASTY ANTERIOR APPROACH;  Surgeon: Rod Can, MD;  Location: [facility];  Service: Orthopedics;  Laterality: Right;  Marland Kitchen VASECTOMY      Current Medications: Current Meds  Medication Sig  . apixaban (ELIQUIS) 5 MG TABS tablet Eliquis 5 mg tablet  TAKE 1 TABLET BY MOUTH TWICE A DAY  . metoprolol tartrate (LOPRESSOR) 50 MG tablet TAKE 1/2  TABLET BY MOUTH EVERY DAY (Patient taking differently: Take 25 mg by mouth daily. )  . polyethylene glycol (MIRALAX / GLYCOLAX) 17 g packet Take 17 g by mouth daily.  . sildenafil (VIAGRA) 100 MG tablet TAKE 1/2 TO 1  (100 MG DOSE) BY MOUTH DAILY AS NEEDED FOR ERECTILE DYSFUNCTION.  Marland Kitchen sulfamethoxazole-trimethoprim (BACTRIM DS) 800-160 MG tablet Take 1 tablet by mouth 2 (two) times daily. Start the day prior to foley removal appointment  . zolpidem (AMBIEN) 10 MG tablet Take 10 mg by mouth at bedtime.     Allergies:   Patient has no known allergies.   Social  History   Socioeconomic History  . Marital status: Married    Spouse name: Raymond Ray  . Number of children: 2  . Years of education: Not on file  . Highest education level: Not on file  Occupational History  . Not on file  Tobacco Use  . Smoking status: Never Smoker  . Smokeless tobacco: Never Used  Vaping Use  . Vaping Use: Never used  Substance and Sexual Activity  . Alcohol use: No  . Drug use: No  . Sexual activity: Yes  Other Topics Concern  . Not on file  Social History Narrative   Right handed   Lives in two story home with wife   Daughter, Raymond Ray, resides in [place]. 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 Daughter, Raymond Ray, resides in [place]. 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    Social Determinants of Health   Financial Resource Strain:   . Difficulty of Paying Living Expenses:   Food Insecurity:   . Worried About Charity fundraiser in the Last Year:   . Arboriculturist in the Last Year:   Transportation Needs:   . Film/video editor (Medical):   Marland Kitchen Lack of Transportation (Non-Medical):   Physical Activity:   . Days of Exercise per Week:   . Minutes of Exercise per Session:   Stress:   . Feeling of Stress :   Social Connections:   . Frequency of Communication with Friends and Family:   . Frequency of Social Gatherings with Friends and Family:   . Attends Religious Services:   . Active Member of Clubs or Organizations:   . Attends Archivist Meetings:   Marland Kitchen Marital Status:      Family History: The patient's family history includes Arrhythmia in his father; Breast cancer in his paternal aunt and sister; Cancer in his mother; Diabetes in his mother; Heart attack in his mother. There is no history of Colon cancer, Esophageal cancer, Stomach cancer, or Rectal cancer.  ROS:   Please see the history of present illness.    All other systems reviewed and are negative.  EKGs/Labs/Other Studies Reviewed:    The following studies were reviewed  today: Echo: IMPRESSIONS    1. Left ventricular ejection fraction, by estimation, is 50 to 55%. The  left ventricle has low normal function. The left ventricle has no regional  wall motion abnormalities. Left ventricular diastolic function could not  be evaluated.  2. Right ventricular systolic function is normal. The right ventricular  size is normal. There is moderately elevated pulmonary artery systolic  pressure.  3. Left atrial size was severely dilated.  4. Right atrial size was severely dilated.  5. The mitral valve is myxomatous. Moderate to severe mitral valve  regurgitation.  6. Tricuspid valve regurgitation is mild to moderate.  7. The aortic valve is normal in structure. Aortic  valve regurgitation is  not visualized. No aortic stenosis is present.  8. Aortic dilatation noted. 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   FINDINGS  Left Ventricle: Left ventricular ejection fraction, by estimation, is 50  to 55%. The left ventricle has low normal function. The left ventricle has  no regional wall motion abnormalities. The left ventricular internal  cavity size was normal in size.  There is borderline left ventricular hypertrophy. Left ventricular  diastolic function could not be evaluated due to atrial fibrillation. Left  ventricular diastolic function could not be evaluated.   Right Ventricle: The right ventricular size is normal. No increase in  right ventricular wall thickness. Right ventricular systolic function is  normal. There is moderately elevated pulmonary artery systolic pressure.  The tricuspid regurgitant velocity is  3.05 m/s, and with an assumed right atrial pressure of 10 mmHg, the  estimated right ventricular systolic pressure is 62.8 mmHg.   Left Atrium: Left atrial size was severely dilated.   Right Atrium: Right atrial size was severely dilated.   Pericardium: There is no evidence of pericardial effusion.    Mitral Valve: The mitral valve is myxomatous. There is mild late systolic  prolapse of of the mitral valve. Moderate to severe mitral valve  regurgitation.   Tricuspid Valve: The tricuspid valve is normal in structure. Tricuspid  valve regurgitation is mild to moderate. No evidence of tricuspid  stenosis.   Aortic Valve: The aortic valve is normal in structure. Aortic valve  regurgitation is not visualized. No aortic stenosis is present.   Pulmonic Valve: The pulmonic valve was normal in structure. Pulmonic valve  regurgitation is trivial. No evidence of pulmonic stenosis.   Aorta: Aortic dilatation noted. 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   IAS/Shunts: The atrial septum is grossly normal.     LEFT VENTRICLE  PLAX 2D  LVIDd:     4.50 cm  LVIDs:     2.70 cm  LV PW:     1.30 cm  LV IVS:    1.00 cm  LVOT diam:   2.25 cm  LV SV:     65  LV SV Index:  31  LVOT Area:   3.98 cm     RIGHT VENTRICLE  RV Basal diam: 4.00 cm  RV S prime:   11.54 cm/s  TAPSE (M-mode): 1.1 cm  RVSP:      45.2 mmHg   LEFT ATRIUM       Index    RIGHT ATRIUM      Index  LA diam:    5.30 cm 2.54 cm/m RA Pressure: 8.00 mmHg  LA Vol (A2C):  142.0 ml 68.15 ml/m RA Area:   29.90 cm  LA Vol (A4C):  84.4 ml 40.51 ml/m RA Volume:  109.00 ml 52.32 ml/m  LA Biplane Vol: 109.0 ml 52.32 ml/m  AORTIC VALVE  LVOT Vmax:  89.33 cm/s  LVOT Vmean: 54.300 cm/s  LVOT VTI:  0.165 m    AORTA  Ao Root diam: 3.70 cm   MR Peak grad:  97.4 mmHg  TRICUSPID VALVE  MR Mean grad:  65.0 mmHg  TR Peak grad:  37.2 mmHg  MR Vmax:     493.50 cm/s TR Vmax:    305.00 cm/s  MR Vmean:    378.2 cm/s Estimated RAP: 8.00 mmHg  MR PISA:     6.28 cm  RVSP:      45.2 mmHg  MR PISA Eff ROA: 47 mm  MR PISA Radius: 1.00 cm   SHUNTS                Systemic VTI: 0.16 m                 Systemic Diam: 2.25 cm  EKG:  EKG is ordered today.  The ekg ordered today demonstrates atrial fibrillation 80 bpm  Recent Labs: 05/26/2019: BUN 26; Creatinine, Ser 1.28; Platelets 263; Potassium 4.6; Sodium 144 06/03/2019: Hemoglobin 10.9  Recent Lipid Panel    Component Value Date/Time   CHOL 166 03/03/2011 0922   TRIG 41.0 03/03/2011 0922   HDL 55.80 03/03/2011 0922   CHOLHDL 3 03/03/2011 0922   VLDL 8.2 03/03/2011 0922   LDLCALC 102 (H) 03/03/2011 0922    Physical Exam:    VS:  BP (!) 86/50   Pulse 83   Ht 6\' 3"  (1.905 m)   Wt 177 lb (80.3 kg)   SpO2 98%   BMI 22.12 kg/m     Wt Readings from Last 3 Encounters:  08/11/19 177 lb (80.3 kg)  06/02/19 177 lb 0.5 oz (80.3 kg)  05/26/19 175 lb 9 oz (79.6 kg)     GEN:  Well nourished, well developed thin male in no acute distress HEENT: Normal NECK: No JVD; No carotid bruits LYMPHATICS: No lymphadenopathy CARDIAC: irregularly irregular with a 3/6 mid-late systolic murmur at the apex RESPIRATORY:  Clear to auscultation without rales, wheezing or rhonchi  ABDOMEN: Soft, non-tender, non-distended MUSCULOSKELETAL:  1+ bilateral ankle edema; No deformity  SKIN: Warm and dry NEUROLOGIC:  Alert and oriented x 3 PSYCHIATRIC:  Normal affect   ASSESSMENT:    1. Nonrheumatic mitral valve regurgitation    PLAN:    In order of problems listed above:  This is a 69 year old male with longstanding history of mitral valve prolapse with mitral regurgitation as well as permanent atrial fibrillation, now with at least moderately severe mitral regurgitation and mild LV dysfunction, New York Heart Association functional class II symptoms of exertional dyspnea and fatigue.  The patient's echocardiogram is personally reviewed.  The left ventricle is not dilated but there is lower than expected LV systolic function in the setting of what appears to be at least 3+ mitral regurgitation.  There is severe biatrial enlargement.   There appears to be bileaflet mitral valve prolapse with moderately severe regurgitation.  I discussed the natural history of mitral regurgitation with the patient today.  We discussed considerations of ongoing medical therapy and close observation with echo surveillance versus surgical intervention.  The patient understands that there is more data needed to help with decision-making.  It is concerning that his LVEF is lower on the recent study that on his previous study 18 months ago.  He does appear to have mild functional limitation.  I have recommended further evaluation with a transesophageal echocardiogram.  This should help define the functional anatomy of the mitral valve as well as the severity of mitral regurgitation.  I reviewed the risks, indications, and alternatives to transesophageal echocardiogram.  The patient understands and agrees to proceed.  I have also recommended a cardiac MR study to better quantitate the patient's LV function and evaluate LV volume as well as regurgitant volumes.  We will check a BNP.  We will then discuss further evaluation with the patient and probable referral to Dr. Roxy Manns for surgical consultation.  All of the patient's questions are answered.   Medication Adjustments/Labs and Tests  Ordered: Current medicines are reviewed at length with the patient today.  Concerns regarding medicines are outlined above.  Orders Placed This Encounter  Procedures  . MR CARDIAC MORPHOLOGY W WO CONTRAST  . Basic metabolic panel  . CBC with Differential/Platelet  . Pro b natriuretic peptide (BNP)  . EKG 12-Lead   Meds ordered this encounter  Medications  . diazepam (VALIUM) 5 MG tablet    Sig: Take 5 mg 1 hour prior to your cardiac MRI. Bring the 2nd tablet with you to take if needed.    Dispense:  2 tablet    Refill:  0    Patient Instructions  Medication Instructions:  Your provider recommends that you continue on your current medications as directed. Please refer  to the Current Medication list given to you today.   *If you need a refill on your cardiac medications before your next appointment, please call your pharmacy*  Lab Work: TODAY: BMET, BNP If you have labs (blood work) drawn today and your tests are completely normal, you will receive your results only by: Marland Kitchen MyChart Message (if you have MyChart) OR . A paper copy in the mail If you have any lab test that is abnormal or we need to change your treatment, we will call you to review the results.  Testing/Procedures: Your physician has requested that you have a TEE. During a TEE, sound waves are used to create images of your heart. It provides your doctor with information about the size and shape of your heart and how well your heart's chambers and valves are working. In this test, a transducer is attached to the end of a flexible tube that's guided down your throat and into your esophagus (the tube leading from you mouth to your stomach) to get a more detailed image of your heart. You are not awake for the procedure. Please see the instruction sheet given to you today. For further information please visit [email or web address].  Dr. Burt Knack recommends you have a CARDIAC MRI.   Follow-Up: We will contact you after your testing for appropriate follow-up planning.    Signed, Sherren Mocha, MD  08/11/2019 3:18 PM    [place]

## 2019-08-12 ENCOUNTER — Telehealth: Payer: Self-pay | Admitting: *Deleted

## 2019-08-12 ENCOUNTER — Encounter: Payer: Self-pay | Admitting: *Deleted

## 2019-08-12 LAB — CBC WITH DIFFERENTIAL/PLATELET
Basophils Absolute: 0.1 10*3/uL (ref 0.0–0.2)
Basos: 1 %
EOS (ABSOLUTE): 0.1 10*3/uL (ref 0.0–0.4)
Eos: 1 %
Hematocrit: 36.1 % — ABNORMAL LOW (ref 37.5–51.0)
Hemoglobin: 12.1 g/dL — ABNORMAL LOW (ref 13.0–17.7)
Immature Grans (Abs): 0 10*3/uL (ref 0.0–0.1)
Immature Granulocytes: 0 %
Lymphocytes Absolute: 1.1 10*3/uL (ref 0.7–3.1)
Lymphs: 20 %
MCH: 34.8 pg — ABNORMAL HIGH (ref 26.6–33.0)
MCHC: 33.5 g/dL (ref 31.5–35.7)
MCV: 104 fL — ABNORMAL HIGH (ref 79–97)
Monocytes Absolute: 0.6 10*3/uL (ref 0.1–0.9)
Monocytes: 10 %
Neutrophils Absolute: 3.9 10*3/uL (ref 1.4–7.0)
Neutrophils: 68 %
Platelets: 243 10*3/uL (ref 150–450)
RBC: 3.48 x10E6/uL — ABNORMAL LOW (ref 4.14–5.80)
RDW: 14.2 % (ref 11.6–15.4)
WBC: 5.6 10*3/uL (ref 3.4–10.8)

## 2019-08-12 LAB — BASIC METABOLIC PANEL
BUN/Creatinine Ratio: 20 (ref 10–24)
BUN: 26 mg/dL (ref 8–27)
CO2: 25 mmol/L (ref 20–29)
Calcium: 9.4 mg/dL (ref 8.6–10.2)
Chloride: 105 mmol/L (ref 96–106)
Creatinine, Ser: 1.27 mg/dL (ref 0.76–1.27)
GFR calc Af Amer: 67 mL/min/{1.73_m2} (ref >59)
GFR calc non Af Amer: 58 mL/min/{1.73_m2} — ABNORMAL LOW (ref >59)
Glucose: 96 mg/dL (ref 65–99)
Potassium: 4.7 mmol/L (ref 3.5–5.2)
Sodium: 144 mmol/L (ref 134–144)

## 2019-08-12 LAB — PRO B NATRIURETIC PEPTIDE: NT-Pro BNP: 1865 pg/mL — ABNORMAL HIGH (ref 0–376)

## 2019-08-12 NOTE — Telephone Encounter (Signed)
Spoke with patient regarding Cardiac MRI appt scheduled 08/23/19 at 8:00 am at [facility]---arrival time is 7:15 am 1st floor admissions office --will mail information to patient.  He voiced his understanding.

## 2019-08-22 ENCOUNTER — Telehealth (HOSPITAL_COMMUNITY): Payer: Self-pay | Admitting: *Deleted

## 2019-08-22 NOTE — Telephone Encounter (Signed)
Reaching out to patient to offer assistance regarding upcoming cardiac imaging study; pt verbalizes understanding of appt date/time, parking situation and where to check in, pre-test medications ordered, and verified current allergies; name and call back number provided for further questions should they arise  [place] [facility] [phone] office [phone] cell

## 2019-08-23 ENCOUNTER — Other Ambulatory Visit (HOSPITAL_COMMUNITY)
Admission: RE | Admit: 2019-08-23 | Discharge: 2019-08-23 | Disposition: A | Discharge status: Home / Self Care | Payer: Medicare Other | Source: Ambulatory Visit | Attending: Internal Medicine | Admitting: Internal Medicine

## 2019-08-23 ENCOUNTER — Other Ambulatory Visit: Payer: Self-pay | Admitting: Cardiovascular Disease

## 2019-08-23 ENCOUNTER — Other Ambulatory Visit: Payer: Self-pay

## 2019-08-23 ENCOUNTER — Ambulatory Visit (HOSPITAL_COMMUNITY)
Admission: RE | Admit: 2019-08-23 | Discharge: 2019-08-23 | Disposition: A | Discharge status: Home / Self Care | Payer: Medicare Other | Source: Ambulatory Visit | Attending: Cardiovascular Disease | Admitting: Cardiovascular Disease

## 2019-08-23 DIAGNOSIS — Z20822 Contact with and (suspected) exposure to covid-19: Secondary | ICD-10-CM | POA: Insufficient documentation

## 2019-08-23 DIAGNOSIS — I340 Nonrheumatic mitral (valve) insufficiency: Secondary | ICD-10-CM | POA: Insufficient documentation

## 2019-08-23 LAB — SARS CORONAVIRUS 2 (TAT 6-24 HRS): SARS Coronavirus 2: NEGATIVE

## 2019-08-23 MED ORDER — GADOBUTROL 1 MMOL/ML IV SOLN
10.0000 mL | Freq: Once | INTRAVENOUS | Status: AC | PRN
  Administered 2019-08-23: 10 mL via INTRAVENOUS

## 2019-08-24 ENCOUNTER — Other Ambulatory Visit: Payer: Self-pay

## 2019-08-24 ENCOUNTER — Ambulatory Visit (HOSPITAL_BASED_OUTPATIENT_CLINIC_OR_DEPARTMENT_OTHER)
Admission: RE | Admit: 2019-08-24 | Discharge: 2019-08-24 | Disposition: A | Discharge status: Home / Self Care | Payer: Medicare Other | Source: Ambulatory Visit | Attending: Cardiovascular Disease | Admitting: Cardiovascular Disease

## 2019-08-24 ENCOUNTER — Ambulatory Visit (HOSPITAL_COMMUNITY)
Admission: RE | Admit: 2019-08-24 | Discharge: 2019-08-24 | Disposition: A | Discharge status: Home / Self Care | Payer: Medicare Other | Attending: Internal Medicine | Admitting: Internal Medicine

## 2019-08-24 ENCOUNTER — Encounter (HOSPITAL_COMMUNITY)
Admission: RE | Disposition: A | Discharge status: Home / Self Care | Payer: Self-pay | Source: Home / Self Care | Attending: Internal Medicine

## 2019-08-24 DIAGNOSIS — Z7901 Long term (current) use of anticoagulants: Secondary | ICD-10-CM | POA: Insufficient documentation

## 2019-08-24 DIAGNOSIS — Z79899 Other long term (current) drug therapy: Secondary | ICD-10-CM | POA: Insufficient documentation

## 2019-08-24 DIAGNOSIS — Z9079 Acquired absence of other genital organ(s): Secondary | ICD-10-CM | POA: Insufficient documentation

## 2019-08-24 DIAGNOSIS — Z96641 Presence of right artificial hip joint: Secondary | ICD-10-CM | POA: Insufficient documentation

## 2019-08-24 DIAGNOSIS — I340 Nonrheumatic mitral (valve) insufficiency: Secondary | ICD-10-CM

## 2019-08-24 DIAGNOSIS — R0602 Shortness of breath: Secondary | ICD-10-CM | POA: Diagnosis not present

## 2019-08-24 DIAGNOSIS — M169 Osteoarthritis of hip, unspecified: Secondary | ICD-10-CM | POA: Insufficient documentation

## 2019-08-24 DIAGNOSIS — I081 Rheumatic disorders of both mitral and tricuspid valves: Secondary | ICD-10-CM | POA: Diagnosis not present

## 2019-08-24 DIAGNOSIS — I341 Nonrheumatic mitral (valve) prolapse: Secondary | ICD-10-CM

## 2019-08-24 HISTORY — PX: BUBBLE STUDY: SHX6837

## 2019-08-24 HISTORY — PX: TEE WITHOUT CARDIOVERSION: SHX5443

## 2019-08-24 SURGERY — ECHOCARDIOGRAM, TRANSESOPHAGEAL
Anesthesia: Moderate Sedation

## 2019-08-24 MED ORDER — SODIUM CHLORIDE 0.9 % IV SOLN: INTRAVENOUS | Status: DC

## 2019-08-24 MED ORDER — FENTANYL CITRATE (PF) 100 MCG/2ML IJ SOLN
INTRAMUSCULAR | Status: AC
  Filled 2019-08-24: qty 4

## 2019-08-24 MED ORDER — DIPHENHYDRAMINE HCL 50 MG/ML IJ SOLN
INTRAMUSCULAR | Status: AC
  Filled 2019-08-24: qty 1

## 2019-08-24 MED ORDER — MIDAZOLAM HCL (PF) 5 MG/ML IJ SOLN
INTRAMUSCULAR | Status: AC
  Filled 2019-08-24: qty 2

## 2019-08-24 MED ORDER — FENTANYL CITRATE (PF) 100 MCG/2ML IJ SOLN
INTRAMUSCULAR | Status: AC
  Filled 2019-08-24: qty 2

## 2019-08-24 MED ORDER — MIDAZOLAM HCL (PF) 5 MG/ML IJ SOLN
INTRAMUSCULAR | Status: AC
  Filled 2019-08-24: qty 1

## 2019-08-24 MED ORDER — FENTANYL CITRATE (PF) 100 MCG/2ML IJ SOLN
INTRAMUSCULAR | Status: DC | PRN
  Administered 2019-08-24 (×4): 50 ug via INTRAVENOUS

## 2019-08-24 MED ORDER — BUTAMBEN-TETRACAINE-BENZOCAINE 2-2-14 % EX AERO
INHALATION_SPRAY | CUTANEOUS | Status: DC | PRN
  Administered 2019-08-24: 2 via TOPICAL

## 2019-08-24 MED ORDER — MIDAZOLAM HCL (PF) 10 MG/2ML IJ SOLN
INTRAMUSCULAR | Status: DC | PRN
  Administered 2019-08-24 (×3): 2 mg via INTRAVENOUS
  Administered 2019-08-24: 1 mg via INTRAVENOUS
  Administered 2019-08-24: 2 mg via INTRAVENOUS
  Administered 2019-08-24: 1 mg via INTRAVENOUS

## 2019-08-24 NOTE — Progress Notes (Signed)
  Echocardiogram Echocardiogram Transesophageal has been performed.  Raymond Ray 08/24/2019, 9:59 AM

## 2019-08-24 NOTE — Discharge Instructions (Signed)

## 2019-08-24 NOTE — CV Procedure (Signed)
INDICATIONS: mitral valve prolapse with regurgitation  PROCEDURE:   Informed consent was obtained prior to the procedure. The risks, benefits and alternatives for the procedure were discussed and the patient comprehended these risks.  Risks include, but are not limited to, cough, sore throat, vomiting, nausea, somnolence, esophageal and stomach trauma or perforation, bleeding, low blood pressure, aspiration, pneumonia, infection, trauma to the teeth and death.    After a procedural time-out, the oropharynx was anesthetized with 20% benzocaine spray.   During this procedure the patient was administered a total of Versed 10 mg and Fentanyl 200 mg to achieve and maintain moderate conscious sedation.  The patient's heart rate, blood pressure, and oxygen saturationweare monitored continuously during the procedure. The period of conscious sedation was 60 minutes, of which I was present face-to-face 100% of this time.  I requested the assistance of Dr. Annye Asa in anesthesia to pass the probe due to resistance. After multiple attempts by both physicians, probe was passed without complications. The transesophageal probe was inserted in the esophagus and stomach without difficulty and multiple views were obtained.  The patient was kept under observation until the patient left the procedure room.  The patient left the procedure room in stable condition.   Agitated microbubble saline contrast was administered.  COMPLICATIONS:    There were no immediate complications.  FINDINGS:  PLEASE SEE FORMAL ECHO REPORT FOR DETAILS.  Bileaflet mitral valve prolapse. 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 Multiple jets seen.  Myxomatous tricuspid valve with moderate TR. Severe biatrial enlargement. No PFO, negative bubble study.   Transgastric views challenging due to air, unable to obtain aortic valve gradient for continuity equation.    RECOMMENDATIONS:    Stable for discharge when alert.  Time Spent Directly with the Patient:  75 minutes   Raymond Ray 08/24/2019, 10:13 AM

## 2019-08-24 NOTE — Interval H&P Note (Signed)
History and Physical Interval Note:  08/24/2019 8:17 AM  Raymond Ray  has presented today for surgery, with the diagnosis of MITRAL VALVE REGURGITATION.  The various methods of treatment have been discussed with the patient and family. After consideration of risks, benefits and other options for treatment, the patient has consented to  Procedure(s): TRANSESOPHAGEAL ECHOCARDIOGRAM (TEE) (N/A) as a surgical intervention.  The patient's history has been reviewed, patient examined, no change in status, stable for surgery.  I have reviewed the patient's chart and labs.  Questions were answered to the patient's satisfaction.     [name]

## 2019-08-25 ENCOUNTER — Encounter (HOSPITAL_COMMUNITY): Payer: Self-pay | Admitting: Internal Medicine

## 2019-08-26 ENCOUNTER — Telehealth: Payer: Self-pay | Admitting: Cardiovascular Disease

## 2019-08-26 ENCOUNTER — Telehealth: Payer: Self-pay

## 2019-08-26 NOTE — Telephone Encounter (Signed)
Transferred call to [facility].

## 2019-08-26 NOTE — Telephone Encounter (Signed)
-----   Message from Sherren Mocha, MD sent at 08/26/2019  6:33 AM EDT ----- Severe MR confirmed. Plan R/L heart cath and surgical referral to [facility].

## 2019-08-26 NOTE — Telephone Encounter (Signed)
Scheduled patient for [facility] 7/14. Instructions briefly reviewed then sent to MyChart for the patient to review. He understands the procedure nurse will call prior to the procedure to review as well. He was grateful for assistance and agrees with treatment plan.

## 2019-09-02 DIAGNOSIS — C61 Malignant neoplasm of prostate: Secondary | ICD-10-CM | POA: Diagnosis not present

## 2019-09-06 ENCOUNTER — Telehealth: Payer: Self-pay | Admitting: *Deleted

## 2019-09-06 ENCOUNTER — Telehealth: Payer: Self-pay | Admitting: Cardiovascular Disease

## 2019-09-06 NOTE — Telephone Encounter (Signed)
Pt aware okay to proceed with [facility] tomorrow as scheduled.

## 2019-09-06 NOTE — Telephone Encounter (Addendum)
Pt contacted pre-catheterization scheduled at [facility] for: Wednesday September 07, 2019 8:30 AM Verified arrival time and place: [place] [facility]) at: 6:30 AM   No solid food after midnight prior to cath, clear liquids until 5 AM day of procedure.  Hold: Eliquis-pt states he took dose of Eliquis PM , 09/05/19, at 9 PM, and not 09/04/19 PM as instructed 08/26/19.                 Pt instructed to hold Eliquis now until post procedure.          Viagra-until post procedure  Except hold medications AM meds can be  taken pre-cath with sips of water including: ASA 81 mg   Confirmed patient has responsible adult to drive home post procedure and observe 24 hours after arriving home: yes  You are allowed ONE visitor in the waiting room during your procedure. Both you and your visitor must wear a mask once you enter the hospital.      COVID-19 Pre-Screening Questions:   In the past 7 to 10 days have you had a new cough, shortness of breath, headache, congestion, fever (100 or greater) unexplained body aches, new sore throat, or sudden loss of taste or sense of smell? no  In the past 7 to 10 days have you been around anyone with known Covid 19? no  Pt states he is fully vaccinated, see immunization history.      Pt took dose of Eliquis 09/05/19 9:00 PM and not 09/04/19 PM as instructed 08/26/19. Pt instructed  to hold now until post procedure. Pt advised I will forward to Dr Burt Knack for review and recommendation if okay to proceed with [facility] 09/07/19. Pt states because of his schedule, tomorrow is the only day he can do [facility]. Pt advised I will follow up with him once I hear back from Dr Burt Knack

## 2019-09-06 NOTE — Telephone Encounter (Signed)
No problem. OK to proceed as scheduled. thanks

## 2019-09-06 NOTE — Telephone Encounter (Signed)
See other phone note 09/06/19

## 2019-09-06 NOTE — Telephone Encounter (Signed)
Will send to heart cath procedure nurse, who patient talk to earlier today.

## 2019-09-06 NOTE — Telephone Encounter (Signed)
Pt wanted to let Dr. Antionette Char nurse know that he DID NOT take any Eliquis this morning. It was a misunderstanding from this morning

## 2019-09-07 ENCOUNTER — Encounter: Payer: Self-pay | Admitting: Thoracic Surgery (Cardiothoracic Vascular Surgery)

## 2019-09-07 ENCOUNTER — Other Ambulatory Visit: Payer: Self-pay

## 2019-09-07 ENCOUNTER — Encounter (HOSPITAL_COMMUNITY)
Admission: RE | Disposition: A | Discharge status: Home / Self Care | Payer: Self-pay | Source: Home / Self Care | Attending: Cardiovascular Disease

## 2019-09-07 ENCOUNTER — Encounter (HOSPITAL_COMMUNITY): Payer: Self-pay | Admitting: Cardiovascular Disease

## 2019-09-07 ENCOUNTER — Ambulatory Visit (HOSPITAL_COMMUNITY)
Admission: RE | Admit: 2019-09-07 | Discharge: 2019-09-07 | Disposition: A | Discharge status: Home / Self Care | Payer: Medicare Other | Attending: Cardiovascular Disease | Admitting: Cardiovascular Disease

## 2019-09-07 DIAGNOSIS — Z79899 Other long term (current) drug therapy: Secondary | ICD-10-CM | POA: Diagnosis not present

## 2019-09-07 DIAGNOSIS — I4821 Permanent atrial fibrillation: Secondary | ICD-10-CM | POA: Diagnosis not present

## 2019-09-07 DIAGNOSIS — Z7901 Long term (current) use of anticoagulants: Secondary | ICD-10-CM | POA: Insufficient documentation

## 2019-09-07 DIAGNOSIS — I2510 Atherosclerotic heart disease of native coronary artery without angina pectoris: Secondary | ICD-10-CM | POA: Insufficient documentation

## 2019-09-07 DIAGNOSIS — Z8546 Personal history of malignant neoplasm of prostate: Secondary | ICD-10-CM | POA: Diagnosis not present

## 2019-09-07 DIAGNOSIS — I071 Rheumatic tricuspid insufficiency: Secondary | ICD-10-CM | POA: Insufficient documentation

## 2019-09-07 DIAGNOSIS — I340 Nonrheumatic mitral (valve) insufficiency: Secondary | ICD-10-CM | POA: Diagnosis present

## 2019-09-07 HISTORY — PX: RIGHT/LEFT HEART CATH AND CORONARY ANGIOGRAPHY: CATH118266

## 2019-09-07 LAB — POCT I-STAT 7, (LYTES, BLD GAS, ICA,H+H)
Acid-base deficit: 1 mmol/L (ref 0.0–2.0)
Bicarbonate: 24.1 mmol/L (ref 20.0–28.0)
Calcium, Ion: 1.2 mmol/L (ref 1.15–1.40)
HCT: 30 % — ABNORMAL LOW (ref 39.0–52.0)
Hemoglobin: 10.2 g/dL — ABNORMAL LOW (ref 13.0–17.0)
O2 Saturation: 100 %
Potassium: 4.2 mmol/L (ref 3.5–5.1)
Sodium: 145 mmol/L (ref 135–145)
TCO2: 25 mmol/L (ref 22–32)
pCO2 arterial: 41.2 mmHg (ref 32.0–48.0)
pH, Arterial: 7.375 (ref 7.350–7.450)
pO2, Arterial: 183 mmHg — ABNORMAL HIGH (ref 83.0–108.0)

## 2019-09-07 LAB — POCT I-STAT EG7
Acid-base deficit: 1 mmol/L (ref 0.0–2.0)
Bicarbonate: 25 mmol/L (ref 20.0–28.0)
Calcium, Ion: 1.28 mmol/L (ref 1.15–1.40)
HCT: 31 % — ABNORMAL LOW (ref 39.0–52.0)
Hemoglobin: 10.5 g/dL — ABNORMAL LOW (ref 13.0–17.0)
O2 Saturation: 73 %
Potassium: 4.3 mmol/L (ref 3.5–5.1)
Sodium: 144 mmol/L (ref 135–145)
TCO2: 26 mmol/L (ref 22–32)
pCO2, Ven: 45.2 mmHg (ref 44.0–60.0)
pH, Ven: 7.351 (ref 7.250–7.430)
pO2, Ven: 40 mmHg (ref 32.0–45.0)

## 2019-09-07 SURGERY — RIGHT/LEFT HEART CATH AND CORONARY ANGIOGRAPHY
Anesthesia: LOCAL

## 2019-09-07 MED ORDER — SODIUM CHLORIDE 0.9% FLUSH: 3.0000 mL | Freq: Two times a day (BID) | INTRAVENOUS | Status: DC

## 2019-09-07 MED ORDER — SODIUM CHLORIDE 0.9 % IV SOLN: 250.0000 mL | INTRAVENOUS | Status: DC | PRN

## 2019-09-07 MED ORDER — SODIUM CHLORIDE 0.9 % WEIGHT BASED INFUSION: 1.0000 mL/kg/h | INTRAVENOUS | Status: DC

## 2019-09-07 MED ORDER — FENTANYL CITRATE (PF) 100 MCG/2ML IJ SOLN
INTRAMUSCULAR | Status: AC
  Filled 2019-09-07: qty 2

## 2019-09-07 MED ORDER — MIDAZOLAM HCL 2 MG/2ML IJ SOLN
INTRAMUSCULAR | Status: AC
  Filled 2019-09-07: qty 2

## 2019-09-07 MED ORDER — FENTANYL CITRATE (PF) 100 MCG/2ML IJ SOLN
INTRAMUSCULAR | Status: DC | PRN
  Administered 2019-09-07 (×2): 25 ug via INTRAVENOUS

## 2019-09-07 MED ORDER — VERAPAMIL HCL 2.5 MG/ML IV SOLN
INTRAVENOUS | Status: AC
  Filled 2019-09-07: qty 2

## 2019-09-07 MED ORDER — IOHEXOL 350 MG/ML SOLN
INTRAVENOUS | Status: DC | PRN
  Administered 2019-09-07: 60 mL

## 2019-09-07 MED ORDER — LABETALOL HCL 5 MG/ML IV SOLN: 10.0000 mg | INTRAVENOUS | Status: DC | PRN

## 2019-09-07 MED ORDER — ONDANSETRON HCL 4 MG/2ML IJ SOLN: 4.0000 mg | Freq: Four times a day (QID) | INTRAMUSCULAR | Status: DC | PRN

## 2019-09-07 MED ORDER — LIDOCAINE HCL (PF) 1 % IJ SOLN
INTRAMUSCULAR | Status: DC | PRN
  Administered 2019-09-07: 5 mL

## 2019-09-07 MED ORDER — HEPARIN (PORCINE) IN NACL 1000-0.9 UT/500ML-% IV SOLN
INTRAVENOUS | Status: DC | PRN
  Administered 2019-09-07 (×2): 500 mL

## 2019-09-07 MED ORDER — SODIUM CHLORIDE 0.9% FLUSH: 3.0000 mL | INTRAVENOUS | Status: DC | PRN

## 2019-09-07 MED ORDER — HEPARIN SODIUM (PORCINE) 1000 UNIT/ML IJ SOLN
INTRAMUSCULAR | Status: AC
  Filled 2019-09-07: qty 1

## 2019-09-07 MED ORDER — HEPARIN SODIUM (PORCINE) 1000 UNIT/ML IJ SOLN
INTRAMUSCULAR | Status: DC | PRN
  Administered 2019-09-07: 4000 [IU] via INTRAVENOUS

## 2019-09-07 MED ORDER — ASPIRIN 81 MG PO CHEW: 81.0000 mg | CHEWABLE_TABLET | ORAL | Status: DC

## 2019-09-07 MED ORDER — VERAPAMIL HCL 2.5 MG/ML IV SOLN
INTRAVENOUS | Status: DC | PRN
  Administered 2019-09-07: 10 mL via INTRA_ARTERIAL

## 2019-09-07 MED ORDER — MIDAZOLAM HCL 2 MG/2ML IJ SOLN
INTRAMUSCULAR | Status: DC | PRN
  Administered 2019-09-07 (×2): 1 mg via INTRAVENOUS

## 2019-09-07 MED ORDER — LIDOCAINE HCL (PF) 1 % IJ SOLN
INTRAMUSCULAR | Status: AC
  Filled 2019-09-07: qty 30

## 2019-09-07 MED ORDER — SODIUM CHLORIDE 0.9 % WEIGHT BASED INFUSION
3.0000 mL/kg/h | INTRAVENOUS | Status: AC
  Administered 2019-09-07: 3 mL/kg/h via INTRAVENOUS

## 2019-09-07 MED ORDER — ACETAMINOPHEN 325 MG PO TABS: 650.0000 mg | ORAL_TABLET | ORAL | Status: DC | PRN

## 2019-09-07 MED ORDER — HYDRALAZINE HCL 20 MG/ML IJ SOLN: 10.0000 mg | INTRAMUSCULAR | Status: DC | PRN

## 2019-09-07 MED ORDER — HEPARIN (PORCINE) IN NACL 1000-0.9 UT/500ML-% IV SOLN
INTRAVENOUS | Status: AC
  Filled 2019-09-07: qty 1000

## 2019-09-07 SURGICAL SUPPLY — 12 items

## 2019-09-07 NOTE — Interval H&P Note (Signed)
History and Physical Interval Note:  09/07/2019 8:28 AM  Raymond Ray  has presented today for surgery, with the diagnosis of mitral regurgitation.  The various methods of treatment have been discussed with the patient and family. After consideration of risks, benefits and other options for treatment, the patient has consented to  Procedure(s): RIGHT/LEFT HEART CATH AND CORONARY ANGIOGRAPHY (N/A) as a surgical intervention.  The patient's history has been reviewed, patient examined, no change in status, stable for surgery.  I have reviewed the patient's chart and labs.  Questions were answered to the patient's satisfaction.     Sherren Mocha

## 2019-09-07 NOTE — Discharge Instructions (Signed)
Radial Site Care  This sheet gives you information about how to care for yourself after your procedure. Your health care provider may also give you more specific instructions. If you have problems or questions, contact your health care provider. What can I expect after the procedure? After the procedure, it is common to have:  Bruising and tenderness at the catheter insertion area. Follow these instructions at home: Medicines  Take over-the-counter and prescription medicines only as told by your health care provider. Insertion site care  Follow instructions from your health care provider about how to take care of your insertion site. Make sure you: ? Wash your hands with soap and water before you change your bandage (dressing). If soap and water are not available, use hand sanitizer. ? Change your dressing as told by your health care provider. ? Leave stitches (sutures), skin glue, or adhesive strips in place. These skin closures may need to stay in place for 2 weeks or longer. If adhesive strip edges start to loosen and curl up, you may trim the loose edges. Do not remove adhesive strips completely unless your health care provider tells you to do that.  Check your insertion site every day for signs of infection. Check for: ? Redness, swelling, or pain. ? Fluid or blood. ? Pus or a bad smell. ? Warmth.  Do not take baths, swim, or use a hot tub until your health care provider approves.  You may shower 24-48 hours after the procedure, or as directed by your health care provider. ? Remove the dressing and gently wash the site with plain soap and water. ? Pat the area dry with a clean towel. ? Do not rub the site. That could cause bleeding.  Do not apply powder or lotion to the site. Activity   For 24 hours after the procedure, or as directed by your health care provider: ? Do not flex or bend the affected arm. ? Do not push or pull heavy objects with the affected arm. ? Do not  drive yourself home from the hospital or clinic. You may drive 24 hours after the procedure unless your health care provider tells you not to. ? Do not operate machinery or power tools.  Do not lift anything that is heavier than 10 lb (4.5 kg), or the limit that you are told, until your health care provider says that it is safe.  Ask your health care provider when it is okay to: ? Return to work or school. ? Resume usual physical activities or sports. ? Resume sexual activity. General instructions  If the catheter site starts to bleed, raise your arm and put firm pressure on the site. If the bleeding does not stop, get help right away. This is a medical emergency.  If you went home on the same day as your procedure, a responsible adult should be with you for the first 24 hours after you arrive home.  Keep all follow-up visits as told by your health care provider. This is important. Contact a health care provider if:  You have a fever.  You have redness, swelling, or yellow drainage around your insertion site. Get help right away if:  You have unusual pain at the radial site.  The catheter insertion area swells very fast.  The insertion area is bleeding, and the bleeding does not stop when you hold steady pressure on the area.  Your arm or hand becomes pale, cool, tingly, or numb. These symptoms may represent a serious problem   that is an emergency. Do not wait to see if the symptoms will go away. Get medical help right away. Call your local emergency services (911 in the [place] Do not drive yourself to the hospital. Summary  After the procedure, it is common to have bruising and tenderness at the site.  Follow instructions from your health care provider about how to take care of your radial site wound. Check the wound every day for signs of infection.  Do not lift anything that is heavier than 10 lb (4.5 kg), or the limit that you are told, until your health care provider says  that it is safe. This information is not intended to replace advice given to you by your health care provider. Make sure you discuss any questions you have with your health care provider. Document Revised: 03/18/2017 Document Reviewed: 03/18/2017 [employer] Patient Education  2020 [employer]  

## 2019-09-08 ENCOUNTER — Institutional Professional Consult (permissible substitution) (INDEPENDENT_AMBULATORY_CARE_PROVIDER_SITE_OTHER): Payer: Medicare Other | Admitting: Thoracic Surgery (Cardiothoracic Vascular Surgery)

## 2019-09-08 ENCOUNTER — Encounter: Payer: Medicare Other | Admitting: Thoracic Surgery (Cardiothoracic Vascular Surgery)

## 2019-09-08 ENCOUNTER — Encounter: Payer: Self-pay | Admitting: Thoracic Surgery (Cardiothoracic Vascular Surgery)

## 2019-09-08 VITALS — BP 114/75 | HR 83 | Temp 98.1°F | Resp 16 | Ht 75.0 in | Wt 175.0 lb

## 2019-09-08 DIAGNOSIS — I4821 Permanent atrial fibrillation: Secondary | ICD-10-CM

## 2019-09-08 DIAGNOSIS — I340 Nonrheumatic mitral (valve) insufficiency: Secondary | ICD-10-CM | POA: Diagnosis not present

## 2019-09-08 DIAGNOSIS — I361 Nonrheumatic tricuspid (valve) insufficiency: Secondary | ICD-10-CM | POA: Diagnosis not present

## 2019-09-08 NOTE — Patient Instructions (Addendum)
Stop taking Eliquis 7 days prior to surgery (July 28)  Continue taking all other medications without change through the day before surgery.  Make sure to bring all of your medications with you when you come for your Pre-Admission Testing appointment at [facility] Short-Stay Department.  Have nothing to eat or drink after midnight the night before surgery.  On the morning of surgery take only Metoprolol with a sip of water.  At your appointment for Pre-Admission Testing at the [facility] Short-Stay Department you will be asked to sign permission forms for your upcoming surgery.  By definition your signature on these forms implies that you and/or your designee provide full informed consent for your planned surgical procedure(s), that alternative treatment options have been discussed, that you understand and accept any and all potential risks, and that you have some understanding of what to expect for your post-operative convalescence.  For any major cardiac surgical procedure potential operative risks include but are not limited to at least some risk of death, stroke or other neurologic complication, myocardial infarction, congestive heart failure, respiratory failure, renal failure, bleeding requiring blood transfusion and/or reexploration, irregular heart rhythm, heart block or bradycardia requiring permanent pacemaker, pneumonia, pericardial effusion, pleural effusion, wound infection, pulmonary embolus or other thromboembolic complication, chronic pain, or other complications related to the specific procedure(s) performed.  Please call to schedule a follow-up appointment in our office prior to surgery if you have any unresolved questions about your planned surgical procedure, the associated risks, alternative treatment options, and/or expectations for your post-operative recovery.

## 2019-09-08 NOTE — Progress Notes (Signed)
[address]       [place] [address]             [phone]     CARDIOTHORACIC SURGERY CONSULTATION REPORT  Referring Provider is Sherren Mocha, MD Primary Cardiologist is [name], MD PCP is Crist Infante, MD  Chief Complaint  Patient presents with   Mitral Regurgitation    Surgical eval, Cardiac Cath 09/07/19, TEE 08/24/19, ECHO 08/01/19    HPI:  Patient is a 69 year old male who has been referred for surgical consultation to discuss treatment options for management of mitral valve prolapse with severe symptomatic primary mitral regurgitation and permanent atrial fibrillation.  Patient has long history of mitral valve prolapse and atrial fibrillation first diagnosed nearly 40 years ago.  He initially underwent cardioversion and was treated with quinidine and maintained sinus rhythm for more than 10 years.  He later developed recurrent persistent atrial fibrillation for which she has been treated with long-term anticoagulation and rate control.  He was followed for many years by Dr. Mare Ferrari and more recently he has been followed by Dr. Oval Linsey.  He states that his only been during the last several years that he has known of presence of a heart murmur.  Echocardiogram performed in 2011 reportedly revealed holosystolic mitral valve prolapse with moderate mitral regurgitation.  Follow-up echocardiogram January 2020 revealed mitral valve prolapse with moderate mitral regurgitation and normal left ventricular systolic function with ejection fraction estimated 60 to 65%.    Patient recently developed prostate cancer for which he underwent robotic prostatectomy in April 2021.  He recovered uneventfully and did not have problems with congestive heart failure during his brief hospitalization.  He does complain of exertional shortness of breath with more strenuous exertion, such as walking up a hill or walking on an elliptical trainer at a brisk pace.  He also  reports that he gets short of breath walking up a flight of stairs.  Recent follow-up transthoracic echocardiogram revealed mitral valve prolapse with "moderate to severe mitral regurgitation" with drop in left ventricular systolic function and ejection fraction estimated only 50 to 55%.  Transesophageal echocardiogram was performed August 24, 2019 and confirmed the presence of bileaflet prolapse with severe mitral regurgitation and mildly reduced left ventricular systolic function.  There was no definite flail segment in either leaflet but there was severe mitral regurgitation with systolic flow reversal in the pulmonary veins.  Ejection fraction was estimated 50 to 55%.  There was moderate right ventricular systolic dysfunction with mild right ventricular chamber enlargement and moderate tricuspid regurgitation.  Patient was referred to the multidisciplinary heart valve clinic and has been evaluated previously by Dr. Burt Knack.  Diagnostic cardiac catheterization was performed and revealed nonobstructive calcified plaque in the left anterior descending coronary artery with otherwise minimal nonobstructive coronary artery disease.  Right heart pressures were normal although there was a large V wave and pulmonary capillary wedge tracing consistent with severe mitral regurgitation.  Cardiothoracic surgical consultation was requested.  Patient is married and lives locally in [place] with his wife.  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  He remains physically active and exercises on a regular basis.  He admits to exertional shortness of breath with more strenuous physical exertion as noted previously.  He denies any history of resting shortness of breath, PND, orthopnea, palpitations, dizzy spells, or syncope.  He does get some  lower extremity edema, typically worse at the end of the day.  He has been chronically  anticoagulated for many years and was on Coumadin for a long time but more recently has been anticoagulated using Eliquis.  He has not had any significant bleeding complications and reports only occasional minor epistaxis.  He has never had any history of TIA or stroke.  He does not experience palpitations and he has not had problems with tachybradycardia symptoms nor issues with rate control.   Past Medical History:  Diagnosis Date   Allergy    Chronic atrial fibrillation ([facility])    History of colonoscopy 04/2001   negative   Mitral valve prolapse    Nonrheumatic mitral (valve) insufficiency    Osteoarthritis of hip    Permanent atrial fibrillation ([facility])    PONV (postoperative nausea and vomiting)    left knee cartilage removal;     Prostate cancer ([facility])    Tricuspid regurgitation    Vertigo 01/07/2017   currently not symptomatic     Past Surgical History:  Procedure Laterality Date   BUBBLE STUDY  08/24/2019   Procedure: BUBBLE STUDY;  Surgeon: [name], MD;  Location: [facility];  Service: Cardiology;;   COLONOSCOPY     KNEE SURGERY     left at age 44 in [place] Bilateral 06/02/2019   Procedure: Noel Journey, PELVIC;  Surgeon: [name], MD;  Location: [facility];  Service: Urology;  Laterality: Bilateral;   PROSTATE BIOPSY     RIGHT/LEFT HEART CATH AND CORONARY ANGIOGRAPHY N/A 09/07/2019   Procedure: RIGHT/LEFT HEART CATH AND CORONARY ANGIOGRAPHY;  Surgeon: Sherren Mocha, MD;  Location: [facility];  Service: Cardiovascular;  Laterality: N/A;   ROBOT ASSISTED LAPAROSCOPIC RADICAL PROSTATECTOMY N/A 06/02/2019   Procedure: XI ROBOTIC ASSISTED LAPAROSCOPIC RADICAL PROSTATECTOMY LEVEL 2;  Surgeon: [name], MD;  Location: [facility];  Service: Urology;  Laterality: N/A;   spinal injection     December 2013   TEE WITHOUT CARDIOVERSION N/A 08/24/2019   Procedure: TRANSESOPHAGEAL ECHOCARDIOGRAM (TEE);  Surgeon: [name], MD;  Location: [facility];  Service: Cardiology;  Laterality: N/A;   TOTAL HIP ARTHROPLASTY Right 01/29/2017   Procedure: RIGHT TOTAL HIP ARTHROPLASTY ANTERIOR APPROACH;  Surgeon: Rod Can, MD;  Location: [facility];  Service: Orthopedics;  Laterality: Right;   VASECTOMY      Family History  Problem Relation Age of Onset   Arrhythmia Father        tachycardia   Cancer Mother    Heart attack Mother    Diabetes Mother    Breast cancer Sister    Breast cancer Paternal Aunt    Colon cancer Neg Hx    Esophageal cancer Neg Hx    Stomach cancer Neg Hx    Rectal cancer Neg Hx     Social History   Socioeconomic History   Marital status: Married    Spouse name: [name]   Number of children: 2   Years of education: Not on file   Highest education level: Not on file  Occupational History   Not on file  Tobacco Use   Smoking status: Never Smoker   Smokeless tobacco: Never Used  Vaping Use   Vaping Use: Never used  Substance and Sexual Activity   Alcohol use: No   Drug use: No   Sexual activity: Yes  Other Topics Concern   Not on file  Social History Narrative   Right handed   Lives  in two story home with wife   Daughter, [name], resides in [place]. 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 Daughter, [name], resides in [place]. 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    Social Determinants of Health   Financial Resource Strain:    Difficulty of Paying Living Expenses:   Food Insecurity:    Worried About Charity fundraiser in the Last Year:    Arboriculturist in the Last Year:   Transportation Needs:    Film/video editor (Medical):    Lack of Transportation (Non-Medical):   Physical Activity:    Days of Exercise per Week:    Minutes of Exercise per Session:   Stress:    Feeling of Stress :   Social Connections:    Frequency of Communication with Friends and Family:    Frequency of Social Gatherings with Friends and Family:     Attends Religious Services:    Active Member of Clubs or Organizations:    Attends Music therapist:    Marital Status:   Intimate Partner Violence:    Fear of Current or Ex-Partner:    Emotionally Abused:    Physically Abused:    Sexually Abused:     Current Outpatient Medications  Medication Sig Dispense Refill   apixaban (ELIQUIS) 5 MG TABS tablet Take 5 mg by mouth 2 (two) times daily.      ascorbic acid (VITAMIN C) 500 MG tablet Take 500 mg by mouth daily.     hydrocortisone 1 % lotion Apply 1 application topically 2 (two) times daily as needed (irritated/itchy skin.).     metoprolol tartrate (LOPRESSOR) 50 MG tablet TAKE 1/2 TABLET BY MOUTH EVERY DAY (Patient taking differently: Take 25 mg by mouth daily. ) 45 tablet 3   oxyCODONE-acetaminophen (PERCOCET) 10-325 MG tablet Take 0.5 tablets by mouth every 4 (four) hours as needed for pain. (Patient taking differently: Take 0.5 tablets by mouth every 4 (four) hours as needed for pain. Pain.) 20 tablet 0   polyethylene glycol (MIRALAX / GLYCOLAX) 17 g packet Take 17 g by mouth daily.     sildenafil (VIAGRA) 100 MG tablet TAKE 1/2 TO 1  (100 MG DOSE) BY MOUTH DAILY AS NEEDED FOR ERECTILE DYSFUNCTION. (Patient taking differently: Take 50-100 mg by mouth daily as needed for erectile dysfunction. ) 10 tablet 5   vitamin B-12 (CYANOCOBALAMIN) 1000 MCG tablet Take 1,000 mcg by mouth daily.     zolpidem (AMBIEN CR) 12.5 MG CR tablet Take 12.5 mg by mouth at bedtime as needed for sleep.     No current facility-administered medications for this visit.    No Known Allergies    Review of Systems:   General:  normal appetite, decreased energy, no weight gain, no weight loss, no fever  Cardiac:  no chest pain with exertion, no chest pain at rest, +SOB with more strenuous exertion, no resting SOB, no PND, no orthopnea, no palpitations, + arrhythmia, + atrial fibrillation, + LE edema, no dizzy spells, no  syncope  Respiratory:  no shortness of breath, no home oxygen, no productive cough, no dry cough, no bronchitis, no wheezing, no hemoptysis, no asthma, no pain with inspiration or cough, no sleep apnea, no CPAP at night  GI:   no difficulty swallowing, no reflux, no frequent heartburn, no hiatal hernia, no abdominal pain, no constipation, no diarrhea, no hematochezia, no hematemesis, no melena  GU:   no dysuria,  no frequency, no urinary tract infection, no hematuria,  no enlarged prostate, no kidney stones, no kidney disease  Vascular:  no pain suggestive of claudication, + pain in feet, no leg cramps, + varicose veins, no DVT, no non-healing foot ulcer  Neuro:   no stroke, no TIA's, no seizures, no headaches, no temporary blindness one eye,  no slurred speech, no peripheral neuropathy, no chronic pain, no instability of gait, no memory/cognitive dysfunction  Musculoskeletal: + arthritis, no joint swelling, no myalgias, no difficulty walking, normal mobility   Skin:   no rash, no itching, no skin infections, no pressure sores or ulcerations  Psych:   no anxiety, no depression, no nervousness, no unusual recent stress  Eyes:   no blurry vision, no floaters, no recent vision changes, does not wear glasses or contacts  ENT:   no hearing loss, no loose or painful teeth, no dentures, last saw dentist February 2021  Hematologic:  + easy bruising, no abnormal bleeding, no clotting disorder, no frequent epistaxis  Endocrine:  no diabetes, does not check CBG's at home     Physical Exam:   BP 114/75 (BP Location: Left Arm, Patient Position: Sitting, Cuff Size: Normal)    Pulse 83    Temp 98.1 F (36.7 C)    Resp 16    Ht 6\' 3"  (1.905 m)    Wt 175 lb (79.4 kg)    SpO2 99% Comment: RA   BMI 21.87 kg/m   General:    well-appearing  HEENT:  Unremarkable   Neck:   no JVD, no bruits, no adenopathy   Chest:   clear to auscultation, symmetrical breath sounds, no wheezes, no rhonchi   CV:   Irregular rate and  rhythm, grade III/VI holosystolic murmur best at apex  Abdomen:  soft, non-tender, no masses   Extremities:  warm, well-perfused, pulses diminished, mild LE edema  Rectal/GU  Deferred  Neuro:   Grossly non-focal and symmetrical throughout  Skin:   Clean and dry, no rashes, no breakdown   Diagnostic Tests:   ECHOCARDIOGRAM REPORT       Patient Name:  Raymond Ray Date of Exam: 08/01/2019  Medical Rec #: [id number]      Height:    75.0 in  Accession #:  [id number]     Weight:    177.0 lb  Date of Birth: 18-Nov-1950      BSA:     2.084 m  Patient Age:  68 years      BP:      111/75 mmHg  Patient Gender: M          HR:      81 bpm.  Exam Location: [address]   Procedure: 2D Echo, Cardiac Doppler and Color Doppler   Indications:  I05.9 Mitral valve disorder    History:    Patient has prior history of Echocardiogram examinations,  most         recent 03/05/2018. Mitral Valve Prolapse. Non-rheumatic  mitral         valve regurgitation. Chronic atrial fibrillation. Iron         deficiency anemia.    Sonographer:  Diamond Nickel RCS  Referring Phys: [phone] [place]    IMPRESSIONS    1. Left ventricular ejection fraction, by estimation, is 50 to 55%. The  left ventricle has low normal function. The left ventricle has no regional  wall motion abnormalities. Left ventricular diastolic function could not  be evaluated.  2. Right ventricular systolic function is normal. The right ventricular  size  is normal. There is moderately elevated pulmonary artery systolic  pressure.  3. Left atrial size was severely dilated.  4. Right atrial size was severely dilated.  5. The mitral valve is myxomatous. Moderate to severe mitral valve  regurgitation.  6. Tricuspid valve regurgitation is mild to moderate.  7. The aortic valve is normal in structure. Aortic valve  regurgitation is  not visualized. No aortic stenosis is present.  8. Aortic dilatation noted. 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   FINDINGS  Left Ventricle: Left ventricular ejection fraction, by estimation, is 50  to 55%. The left ventricle has low normal function. The left ventricle has  no regional wall motion abnormalities. The left ventricular internal  cavity size was normal in size.  There is borderline left ventricular hypertrophy. Left ventricular  diastolic function could not be evaluated due to atrial fibrillation. Left  ventricular diastolic function could not be evaluated.   Right Ventricle: The right ventricular size is normal. No increase in  right ventricular wall thickness. Right ventricular systolic function is  normal. There is moderately elevated pulmonary artery systolic pressure.  The tricuspid regurgitant velocity is  3.05 m/s, and with an assumed right atrial pressure of 10 mmHg, the  estimated right ventricular systolic pressure is 28.7 mmHg.   Left Atrium: Left atrial size was severely dilated.   Right Atrium: Right atrial size was severely dilated.   Pericardium: There is no evidence of pericardial effusion.   Mitral Valve: The mitral valve is myxomatous. There is mild late systolic  prolapse of of the mitral valve. Moderate to severe mitral valve  regurgitation.   Tricuspid Valve: The tricuspid valve is normal in structure. Tricuspid  valve regurgitation is mild to moderate. No evidence of tricuspid  stenosis.   Aortic Valve: The aortic valve is normal in structure. Aortic valve  regurgitation is not visualized. No aortic stenosis is present.   Pulmonic Valve: The pulmonic valve was normal in structure. Pulmonic valve  regurgitation is trivial. No evidence of pulmonic stenosis.   Aorta: Aortic dilatation noted. 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   IAS/Shunts: The atrial septum is  grossly normal.     LEFT VENTRICLE  PLAX 2D  LVIDd:     4.50 cm  LVIDs:     2.70 cm  LV PW:     1.30 cm  LV IVS:    1.00 cm  LVOT diam:   2.25 cm  LV SV:     65  LV SV Index:  31  LVOT Area:   3.98 cm     RIGHT VENTRICLE  RV Basal diam: 4.00 cm  RV S prime:   11.54 cm/s  TAPSE (M-mode): 1.1 cm  RVSP:      45.2 mmHg   LEFT ATRIUM       Index    RIGHT ATRIUM      Index  LA diam:    5.30 cm 2.54 cm/m RA Pressure: 8.00 mmHg  LA Vol (A2C):  142.0 ml 68.15 ml/m RA Area:   29.90 cm  LA Vol (A4C):  84.4 ml 40.51 ml/m RA Volume:  109.00 ml 52.32 ml/m  LA Biplane Vol: 109.0 ml 52.32 ml/m  AORTIC VALVE  LVOT Vmax:  89.33 cm/s  LVOT Vmean: 54.300 cm/s  LVOT VTI:  0.165 m    AORTA  Ao Root diam: 3.70 cm   MR Peak grad:  97.4 mmHg  TRICUSPID  VALVE  MR Mean grad:  65.0 mmHg  TR Peak grad:  37.2 mmHg  MR Vmax:     493.50 cm/s TR Vmax:    305.00 cm/s  MR Vmean:    378.2 cm/s Estimated RAP: 8.00 mmHg  MR PISA:     6.28 cm  RVSP:      45.2 mmHg  MR PISA Eff ROA: 47 mm  MR PISA Radius: 1.00 cm   SHUNTS                Systemic VTI: 0.16 m                Systemic Diam: 2.25 cm   Mertie Moores MD  Electronically signed by Mertie Moores MD  Signature Date/Time: 08/01/2019/4:13:54 PM       TRANSESOPHOGEAL ECHO REPORT       Patient Name:  [name] Date of Exam: 08/24/2019  Medical Rec #: [id number]      Height:    75.0 in  Accession #:  [id number]     Weight:    177.0 lb  Date of Birth: Jul 06, 1950      BSA:     2.083 m  Patient Age:  59 years      BP:      109/80 mmHg  Patient Gender: M          HR:      82 bpm.  Exam Location: Inpatient   Procedure: 3D Echo, Transesophageal Echo, Cardiac Doppler, Color Doppler  and       Saline Contrast Bubble Study    Indications:   I34.1 Nonrheumatic mitral (valve) prolapse; I34.0  Nonrheumatic          mitral (valve) insufficiency    History:     Patient has prior history of Echocardiogram examinations,  most          recent 08/01/2019. Abnormal ECG, Mitral Valve Disease;          Arrythmias:Atrial Fibrillation.    Sonographer:   Roseanna Rainbow RDCS  Referring Phys: [place]  Diagnosing Phys: Cherlynn Kaiser MD   PROCEDURE: After discussion of the risks and benefits of a TEE, an  informed consent was obtained from the patient. TEE procedure time was 31  minutes. The transesophogeal probe was passed without difficulty through  the esophogus of the patient. Imaged  were obtained with the patient in a left lateral decubitus position. Local  oropharyngeal anesthetic was provided with Cetacaine. Sedation performed  by performing physician. Patients was under conscious sedation during this  procedure. Anesthetic  administered: 234mcg of Fentanyl, 10mg  of Versed. Image quality was good.  The patient's vital signs; including heart rate, blood pressure, and  oxygen saturation; remained stable throughout the procedure. The patient  developed no complications during the  procedure. Assitance on probe placement obtained from Dr. [name],  anesthesiologist. Esophageal intubation was somewhat challenging at the  level of the oropharynx under moderate sedation, ingested air limited  transgastric image quality. Components of  study were limited due to minimize patient discomfort. Unable to obtain 3D  of tricuspid valve.   IMPRESSIONS    1. Bileaflet mitral valve prolapse with cleft bewteen P1 and P2 scallops  of posterior mitral leaflet. P2 and P3 scallops demonstrate greatest  degree of prolapse. Severe mitral valve regurgitation with at least 3  jets. No definite flail component.  Systolic reversal and systolic blunting in the pulmonary veins. The mitral   valve is myxomatous. Severe  mitral valve regurgitation.  2. Left ventricular ejection fraction, by estimation, is 50 to 55%. The  left ventricle has low normal function.  3. Right ventricular systolic function is moderately reduced. The right  ventricular size is mildly enlarged.  4. Left atrial size was severely dilated. No left atrial/left atrial  appendage thrombus was detected.  5. Right atrial size was severely dilated.  6. Prolapse of tricuspid valve leaflets, likely all three leaflets. The  tricuspid valve is myxomatous. Tricuspid valve regurgitation is moderate.  7. Aortic dilatation noted. There is borderline dilatation of the aortic  root measuring 39 mm. There is focal moderate (Grade III) atheroma plaque  involving the transverse aorta.  8. Agitated saline contrast bubble study was negative, with no evidence  of any interatrial shunt.  9. The aortic valve is tricuspid. Aortic valve regurgitation is trivial.   FINDINGS  Left Ventricle: Left ventricular ejection fraction, by estimation, is 50  to 55%. The left ventricle has low normal function. The left ventricular  internal cavity size was normal in size.   Right Ventricle: The right ventricular size is mildly enlarged. No  increase in right ventricular wall thickness. Right ventricular systolic  function is moderately reduced.   Left Atrium: Left atrial size was severely dilated. No left atrial/left  atrial appendage thrombus was detected.   Right Atrium: Right atrial size was severely dilated.   Pericardium: There is no evidence of pericardial effusion.   Mitral Valve: Bileaflet mitral valve prolapse with cleft bewteen P1 and P2  scallops of posterior mitral leaflet. P2 and P3 scallops demonstrate  greatest degree of prolapse. Severe mitral valve regurgitation with at  least 3 jets. No definite flail  component. Systolic reversal and systolic blunting in the pulmonary veins.  The mitral valve is  myxomatous. Severe mitral valve regurgitation.   Tricuspid Valve: Prolapse of tricuspid valve leaflets, likely all three  leaflets. The tricuspid valve is myxomatous. Tricuspid valve regurgitation  is moderate.   Aortic Valve: The aortic valve is tricuspid. Aortic valve regurgitation is  trivial.   Pulmonic Valve: The pulmonic valve was normal in structure. Pulmonic valve  regurgitation is trivial.   Aorta: Aortic dilatation noted. There is borderline dilatation of the  aortic root measuring 39 mm. There is moderate (Grade III) atheroma plaque  involving the transverse aorta.   IAS/Shunts: No atrial level shunt detected by color flow Doppler. Agitated  saline contrast was given intravenously to evaluate for intracardiac  shunting. Agitated saline contrast bubble study was negative, with no  evidence of any interatrial shunt.     LEFT VENTRICLE  PLAX 2D  LVOT diam:   2.20 cm  LVOT Area:   3.80 cm     MR Peak grad:  109.8 mmHg  MR Mean grad:  68.0 mmHg  SHUNTS  MR Vmax:    524.00 cm/s Systemic Diam: 2.20 cm  MR Vmean:    378.0 cm/s  MR PISA:    5.09 cm  MR PISA Radius: 0.90 cm   Cherlynn Kaiser MD  Electronically signed by Cherlynn Kaiser MD  Signature Date/Time: 08/25/2019/9:02:12 AM     RIGHT/LEFT HEART CATH AND CORONARY ANGIOGRAPHY  Conclusion  1.  Calcified nonobstructive proximal LAD stenosis 2.  Patent coronary arteries with mild diffuse irregularity and no flow-limiting coronary stenoses 3.  Normal right heart filling pressures with preserved cardiac output 4.  20 mm V wave consistent with the patient's known mitral regurgitation  Recommendation: Continued plans for cardiac surgical evaluation for mitral  valve repair Surgeon Notes    08/24/2019 10:18 AM [facility] Procedure signed by [name], MD  Indications  Nonrheumatic mitral valve insufficiency [I34.0 (ICD-10-CM)]  Procedural Details  Technical Details INDICATION: Severe  nonrheumatic mitral regurgitation  PROCEDURAL DETAILS: There was an indwelling IV in a right antecubital vein. Using normal sterile technique, the IV was changed out for a 5 Fr brachial sheath over a 0.018 inch wire. The right wrist was then prepped, draped, and anesthetized with 1% lidocaine. Using direct ultrasound guidance a 5/6 French Slender sheath was placed in the right radial artery.  Ultrasound images are captured and stored in the patient's chart.  Intra-arterial verapamil was administered through the radial artery sheath. IV heparin was administered after a JR4 catheter was advanced into the central aorta. A Swan-Ganz catheter was used for the right heart catheterization. Standard protocol was followed for recording of right heart pressures and sampling of oxygen saturations. Fick cardiac output was calculated. Standard Judkins catheters were used for selective coronary angiography. LV pressure is recorded and an aortic valve pullback is performed. There were no immediate procedural complications. The patient was transferred to the post catheterization recovery area for further monitoring.    Estimated blood loss <50 mL.   During this procedure medications were administered to achieve and maintain moderate conscious sedation while the patient's heart rate, blood pressure, and oxygen saturation were continuously monitored and I was present face-to-face 100% of this time.  Medications (Filter: Administrations occurring from [phone] to 0933 on 09/07/19) (important) Continuous medications are totaled by the amount administered until 09/07/19 0933.  Heparin (Porcine) in NaCl 1000-0.9 UT/500ML-% SOLN (mL) Total volume:  1,000 mL  Date/Time  Rate/Dose/Volume Action  09/07/19 0824  500 mL Given  0825  500 mL Given    fentaNYL (SUBLIMAZE) injection (mcg) Total dose:  50 mcg  Date/Time  Rate/Dose/Volume Action  09/07/19 0835  25 mcg Given  0845  25 mcg Given    midazolam (VERSED) injection  (mg) Total dose:  2 mg  Date/Time  Rate/Dose/Volume Action  09/07/19 0835  1 mg Given  0845  1 mg Given    lidocaine (PF) (XYLOCAINE) 1 % injection (mL) Total volume:  5 mL  Date/Time  Rate/Dose/Volume Action  09/07/19 0847  5 mL Given    Radial Cocktail/Verapamil only (mL) Total volume:  10 mL  Date/Time  Rate/Dose/Volume Action  09/07/19 0909  10 mL Given    heparin sodium (porcine) injection (Units) Total dose:  4,000 Units  Date/Time  Rate/Dose/Volume Action  09/07/19 0911  4,000 Units Given    iohexol (OMNIPAQUE) 350 MG/ML injection (mL) Total volume:  60 mL  Date/Time  Rate/Dose/Volume Action  09/07/19 0924  60 mL Given    Sedation Time  Sedation Time Physician-1: 48 minutes 7 seconds  Contrast  Medication Name Total Dose  iohexol (OMNIPAQUE) 350 MG/ML injection 60 mL    Radiation/Fluoro  Fluoro time: 9.6 (min) DAP: 12.1 (Gycm2) Cumulative Air Kerma: 248.9 (mGy)  Coronary Findings  Diagnostic Dominance: Co-dominant Left Anterior Descending  There is mild diffuse disease throughout the vessel. The LAD wraps around the LV apex. The vessel is large in caliber with mild diffuse irregularity. There is a calcified plaque in the proximal LAD before the first septal perforator with 35% stenosis present. There is no high-grade obstructive disease throughout the LAD territory.  Prox LAD lesion is 35% stenosed. The lesion is severely calcified.  Ramus Intermedius  Vessel is moderate in size. The ramus intermedius  trifurcates and has no significant stenosis throughout its distribution. The vessel is medium in caliber.  Left Circumflex  Vessel is moderate in size. The vessel exhibits minimal luminal irregularities. The circumflex is medium in caliber with mild irregularity noted.  Right Coronary Artery  There is mild diffuse disease throughout the vessel. There are mild coronary irregularities noted with up to 25% stenosis in the mid vessel. This is a large, dominant  vessel.  Intervention  No interventions have been documented. Coronary Diagrams  Diagnostic Dominance: Co-dominant  Intervention  Implants   No implant documentation for this case.  Syngo Images  Show images for CARDIAC CATHETERIZATION Images on Long Term Storage  Show images for Ran, Tullis to Procedure Log  Procedure Log    Hemo Data   Most Recent Value  Fick Cardiac Output 6.19 L/min  Fick Cardiac Output Index 2.99 (L/min)/BSA  RA A Wave 6 mmHg  RA V Wave 7 mmHg  RA Mean 5 mmHg  RV Systolic Pressure 25 mmHg  RV Diastolic Pressure 2 mmHg  RV EDP 4 mmHg  PA Systolic Pressure 27 mmHg  PA Diastolic Pressure 9 mmHg  PA Mean 17 mmHg  PW A Wave 13 mmHg  PW V Wave 20 mmHg  PW Mean 13 mmHg  AO Systolic Pressure 287 mmHg  AO Diastolic Pressure 68 mmHg  AO Mean 88 mmHg  LV Systolic Pressure 867 mmHg  LV Diastolic Pressure 6 mmHg  LV EDP 11 mmHg  AOp Systolic Pressure 672 mmHg  AOp Diastolic Pressure 61 mmHg  AOp Mean Pressure 80 mmHg  LVp Systolic Pressure 094 mmHg  LVp Diastolic Pressure 6 mmHg  LVp EDP Pressure 11 mmHg  QP/QS 1  TPVR Index 5.68 HRUI  TSVR Index 26.07 HRUI  PVR SVR Ratio 0.05  TPVR/TSVR Ratio 0.22     Impression:  Patient has mitral valve prolapse with stage D severe symptomatic primary mitral regurgitation.  He describes recent progression of mild symptoms of exertional shortness of breath and fatigue that occur only with more strenuous physical exertion, consistent with chronic diastolic congestive heart failure, [place] Heart Association functional class I-II.  He remains quite active physically and also recently recovered uneventfully from robotic prostatectomy without significant complications.  I have personally reviewed the patient's recent transthoracic and transesophageal echocardiograms and diagnostic cardiac catheterization.  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  There are multiple jets of regurgitation with severe mitral regurgitation including flow reversal in the pulmonary veins.  There does not appear to be any flail segments or ruptured primary chordae tendinae.  Left ventricular function is somewhat reduced with ejection fraction estimated only 50 to 55% in the setting of severe mitral regurgitation.  The right ventricle is mildly dilated and right ventricular function appears mildly reduced.  There is some dilatation of the tricuspid annulus and moderate tricuspid regurgitation.  The aortic valve appears normal.  Diagnostic cardiac catheterization is notable for the absence of significant coronary artery disease and revealed normal right heart filling pressures with preserved cardiac output.  There were large V waves on pulmonary capillary wedge tracing consistent with severe mitral regurgitation.  I agree the patient would benefit from mitral valve repair.  Concomitant tricuspid annuloplasty may be indicated.  He might benefit from concomitant Maze procedure at least clipping of the left atrial appendage, although given the extremely long duration of longstanding permanent atrial fibrillation the  likelihood of restoring sinus rhythm is probably extremely low.  Concomitant Maze procedure might be associated with improved rate control and/or development of persistent junctional rhythm, and perhaps associated with a need for permanent pacemaker placement due to chronotropic insufficiency.   Plan:  The patient and his wife were counseled at length regarding the indications, risks and potential benefits of mitral valve repair.  The rationale for elective surgery has been explained, including a comparison between surgery and continued medical therapy with close follow-up.  The likelihood of successful and durable mitral valve repair has been discussed with particular reference to the  findings of their recent echocardiogram.  Based upon these findings and previous experience, I have quoted them a greater than 95 percent likelihood of successful valve repair with less than 2 percent risk of mortality or major morbidity.  Alternative surgical approaches have been discussed including a comparison between conventional sternotomy and minimally-invasive techniques.  The relative risks and benefits of each have been reviewed as they pertain to the patient's specific circumstances, and expectations for the patient's postoperative convalescence has been discussed.  The relative risks and benefits of performing a maze procedure at the time of their surgery was discussed at length, including the expected likelihood of long term freedom from recurrent symptomatic atrial fibrillation and/or atrial flutter.  The possible indications and benefits for concomitant tricuspid annuloplasty were discussed.    he patient desires to proceed with surgery in early August.  We tentatively plan to proceed with surgery on September 28, 2019.  The patient will return to our office for follow-up prior to surgery on September 26, 2019.  Prior to that he will undergo CT angiography to evaluate the feasibility of peripheral cannulation for surgery.     I spent in excess of 90 minutes during the conduct of this office consultation and >50% of this time involved direct face-to-face encounter with the patient for counseling and/or coordination of their care.   Valentina Gu. Roxy Manns, MD 09/08/2019 3:24 PM

## 2019-09-09 ENCOUNTER — Encounter: Payer: Self-pay | Admitting: *Deleted

## 2019-09-09 ENCOUNTER — Other Ambulatory Visit: Payer: Self-pay | Admitting: Thoracic Surgery (Cardiothoracic Vascular Surgery)

## 2019-09-09 ENCOUNTER — Other Ambulatory Visit: Payer: Self-pay | Admitting: *Deleted

## 2019-09-09 DIAGNOSIS — C61 Malignant neoplasm of prostate: Secondary | ICD-10-CM | POA: Diagnosis not present

## 2019-09-09 DIAGNOSIS — I340 Nonrheumatic mitral (valve) insufficiency: Secondary | ICD-10-CM

## 2019-09-09 DIAGNOSIS — N393 Stress incontinence (female) (male): Secondary | ICD-10-CM | POA: Diagnosis not present

## 2019-09-09 DIAGNOSIS — I4821 Permanent atrial fibrillation: Secondary | ICD-10-CM

## 2019-09-09 DIAGNOSIS — N5201 Erectile dysfunction due to arterial insufficiency: Secondary | ICD-10-CM | POA: Diagnosis not present

## 2019-09-09 NOTE — Progress Notes (Unsigned)
ct 

## 2019-09-13 ENCOUNTER — Other Ambulatory Visit: Payer: Self-pay | Admitting: Thoracic Surgery (Cardiothoracic Vascular Surgery)

## 2019-09-13 DIAGNOSIS — I340 Nonrheumatic mitral (valve) insufficiency: Secondary | ICD-10-CM

## 2019-09-15 ENCOUNTER — Other Ambulatory Visit: Payer: Self-pay

## 2019-09-15 ENCOUNTER — Encounter (HOSPITAL_COMMUNITY): Payer: Self-pay

## 2019-09-15 ENCOUNTER — Ambulatory Visit (HOSPITAL_COMMUNITY)
Admission: RE | Admit: 2019-09-15 | Discharge: 2019-09-15 | Disposition: A | Discharge status: Home / Self Care | Payer: Medicare Other | Source: Ambulatory Visit | Attending: Thoracic Surgery (Cardiothoracic Vascular Surgery) | Admitting: Thoracic Surgery (Cardiothoracic Vascular Surgery)

## 2019-09-15 DIAGNOSIS — I340 Nonrheumatic mitral (valve) insufficiency: Secondary | ICD-10-CM

## 2019-09-15 DIAGNOSIS — I712 Thoracic aortic aneurysm, without rupture: Secondary | ICD-10-CM | POA: Diagnosis not present

## 2019-09-15 DIAGNOSIS — I700 Atherosclerosis of aorta: Secondary | ICD-10-CM | POA: Diagnosis not present

## 2019-09-15 LAB — POCT I-STAT CREATININE: Creatinine, Ser: 1.3 mg/dL — ABNORMAL HIGH (ref 0.61–1.24)

## 2019-09-15 MED ORDER — IOHEXOL 350 MG/ML SOLN
100.0000 mL | Freq: Once | INTRAVENOUS | Status: AC | PRN
  Administered 2019-09-15: 100 mL via INTRAVENOUS

## 2019-09-15 MED ORDER — SODIUM CHLORIDE (PF) 0.9 % IJ SOLN
INTRAMUSCULAR | Status: AC
  Filled 2019-09-15: qty 50

## 2019-09-21 ENCOUNTER — Other Ambulatory Visit: Payer: Self-pay | Admitting: *Deleted

## 2019-09-26 ENCOUNTER — Ambulatory Visit (INDEPENDENT_AMBULATORY_CARE_PROVIDER_SITE_OTHER): Payer: Medicare Other | Admitting: Thoracic Surgery (Cardiothoracic Vascular Surgery)

## 2019-09-26 ENCOUNTER — Other Ambulatory Visit: Payer: Self-pay

## 2019-09-26 ENCOUNTER — Encounter: Payer: Self-pay | Admitting: Thoracic Surgery (Cardiothoracic Vascular Surgery)

## 2019-09-26 ENCOUNTER — Ambulatory Visit (HOSPITAL_COMMUNITY)
Admission: RE | Admit: 2019-09-26 | Discharge: 2019-09-26 | Disposition: A | Discharge status: Home / Self Care | Payer: Medicare Other | Source: Ambulatory Visit | Attending: Thoracic Surgery (Cardiothoracic Vascular Surgery) | Admitting: Thoracic Surgery (Cardiothoracic Vascular Surgery)

## 2019-09-26 ENCOUNTER — Encounter (HOSPITAL_COMMUNITY)
Admission: RE | Admit: 2019-09-26 | Discharge: 2019-09-26 | Disposition: A | Discharge status: Home / Self Care | Payer: Medicare Other | Source: Ambulatory Visit | Attending: Thoracic Surgery (Cardiothoracic Vascular Surgery) | Admitting: Thoracic Surgery (Cardiothoracic Vascular Surgery)

## 2019-09-26 ENCOUNTER — Other Ambulatory Visit (HOSPITAL_COMMUNITY)
Admission: RE | Admit: 2019-09-26 | Discharge: 2019-09-26 | Disposition: A | Discharge status: Home / Self Care | Payer: Medicare Other | Source: Ambulatory Visit | Attending: Thoracic Surgery (Cardiothoracic Vascular Surgery) | Admitting: Thoracic Surgery (Cardiothoracic Vascular Surgery)

## 2019-09-26 ENCOUNTER — Encounter (HOSPITAL_COMMUNITY): Payer: Self-pay

## 2019-09-26 ENCOUNTER — Ambulatory Visit (HOSPITAL_BASED_OUTPATIENT_CLINIC_OR_DEPARTMENT_OTHER)
Admission: RE | Admit: 2019-09-26 | Discharge: 2019-09-26 | Disposition: A | Discharge status: Home / Self Care | Payer: Medicare Other | Source: Ambulatory Visit | Attending: Thoracic Surgery (Cardiothoracic Vascular Surgery) | Admitting: Thoracic Surgery (Cardiothoracic Vascular Surgery)

## 2019-09-26 VITALS — BP 110/65 | HR 85 | Temp 97.8°F | Resp 20 | Ht 75.0 in | Wt 178.0 lb

## 2019-09-26 DIAGNOSIS — I2510 Atherosclerotic heart disease of native coronary artery without angina pectoris: Secondary | ICD-10-CM | POA: Diagnosis not present

## 2019-09-26 DIAGNOSIS — Z8546 Personal history of malignant neoplasm of prostate: Secondary | ICD-10-CM | POA: Diagnosis not present

## 2019-09-26 DIAGNOSIS — Z20822 Contact with and (suspected) exposure to covid-19: Secondary | ICD-10-CM | POA: Diagnosis not present

## 2019-09-26 DIAGNOSIS — I340 Nonrheumatic mitral (valve) insufficiency: Secondary | ICD-10-CM

## 2019-09-26 DIAGNOSIS — N170 Acute kidney failure with tubular necrosis: Secondary | ICD-10-CM | POA: Diagnosis not present

## 2019-09-26 DIAGNOSIS — I361 Nonrheumatic tricuspid (valve) insufficiency: Secondary | ICD-10-CM | POA: Diagnosis not present

## 2019-09-26 DIAGNOSIS — Z96641 Presence of right artificial hip joint: Secondary | ICD-10-CM | POA: Diagnosis not present

## 2019-09-26 DIAGNOSIS — Z8249 Family history of ischemic heart disease and other diseases of the circulatory system: Secondary | ICD-10-CM | POA: Diagnosis not present

## 2019-09-26 DIAGNOSIS — R21 Rash and other nonspecific skin eruption: Secondary | ICD-10-CM | POA: Diagnosis not present

## 2019-09-26 DIAGNOSIS — I083 Combined rheumatic disorders of mitral, aortic and tricuspid valves: Secondary | ICD-10-CM | POA: Diagnosis not present

## 2019-09-26 DIAGNOSIS — Z803 Family history of malignant neoplasm of breast: Secondary | ICD-10-CM | POA: Diagnosis not present

## 2019-09-26 DIAGNOSIS — Z01818 Encounter for other preprocedural examination: Secondary | ICD-10-CM | POA: Insufficient documentation

## 2019-09-26 DIAGNOSIS — I4821 Permanent atrial fibrillation: Secondary | ICD-10-CM | POA: Diagnosis not present

## 2019-09-26 DIAGNOSIS — Z833 Family history of diabetes mellitus: Secondary | ICD-10-CM | POA: Diagnosis not present

## 2019-09-26 DIAGNOSIS — Z7901 Long term (current) use of anticoagulants: Secondary | ICD-10-CM | POA: Diagnosis not present

## 2019-09-26 DIAGNOSIS — D62 Acute posthemorrhagic anemia: Secondary | ICD-10-CM | POA: Diagnosis not present

## 2019-09-26 HISTORY — DX: Anemia, unspecified: D64.9

## 2019-09-26 HISTORY — DX: Cardiac murmur, unspecified: R01.1

## 2019-09-26 LAB — COMPREHENSIVE METABOLIC PANEL
ALT: 12 U/L (ref 0–44)
AST: 21 U/L (ref 15–41)
Albumin: 3.8 g/dL (ref 3.5–5.0)
Alkaline Phosphatase: 50 U/L (ref 38–126)
Anion gap: 9 (ref 5–15)
BUN: 21 mg/dL (ref 8–23)
CO2: 23 mmol/L (ref 22–32)
Calcium: 9 mg/dL (ref 8.9–10.3)
Chloride: 105 mmol/L (ref 98–111)
Creatinine, Ser: 1.06 mg/dL (ref 0.61–1.24)
GFR calc Af Amer: >60 mL/min (ref >60)
GFR calc non Af Amer: >60 mL/min (ref >60)
Glucose, Bld: 102 mg/dL — ABNORMAL HIGH (ref 70–99)
Potassium: 4.6 mmol/L (ref 3.5–5.1)
Sodium: 137 mmol/L (ref 135–145)
Total Bilirubin: 1.5 mg/dL — ABNORMAL HIGH (ref 0.3–1.2)
Total Protein: 6 g/dL — ABNORMAL LOW (ref 6.5–8.1)

## 2019-09-26 LAB — CBC
HCT: 32.4 % — ABNORMAL LOW (ref 39.0–52.0)
Hemoglobin: 10.9 g/dL — ABNORMAL LOW (ref 13.0–17.0)
MCH: 35.5 pg — ABNORMAL HIGH (ref 26.0–34.0)
MCHC: 33.6 g/dL (ref 30.0–36.0)
MCV: 105.5 fL — ABNORMAL HIGH (ref 80.0–100.0)
Platelets: 271 10*3/uL (ref 150–400)
RBC: 3.07 MIL/uL — ABNORMAL LOW (ref 4.22–5.81)
RDW: 14.8 % (ref 11.5–15.5)
WBC: 6 10*3/uL (ref 4.0–10.5)
nRBC: 0 % (ref 0.0–0.2)

## 2019-09-26 LAB — URINALYSIS, ROUTINE W REFLEX MICROSCOPIC
Bilirubin Urine: NEGATIVE
Glucose, UA: NEGATIVE mg/dL
Hgb urine dipstick: NEGATIVE
Ketones, ur: NEGATIVE mg/dL
Leukocytes,Ua: NEGATIVE
Nitrite: NEGATIVE
Protein, ur: NEGATIVE mg/dL
Specific Gravity, Urine: 1.018 (ref 1.005–1.030)
pH: 6 (ref 5.0–8.0)

## 2019-09-26 LAB — BLOOD GAS, ARTERIAL
Acid-Base Excess: 0.4 mmol/L (ref 0.0–2.0)
Bicarbonate: 24.5 mmol/L (ref 20.0–28.0)
Drawn by: 421801
FIO2: 21
O2 Saturation: 97.8 %
Patient temperature: 37
pCO2 arterial: 39.5 mmHg (ref 32.0–48.0)
pH, Arterial: 7.408 (ref 7.350–7.450)
pO2, Arterial: 103 mmHg (ref 83.0–108.0)

## 2019-09-26 LAB — HEMOGLOBIN A1C
Hgb A1c MFr Bld: 5.7 % — ABNORMAL HIGH (ref 4.8–5.6)
Mean Plasma Glucose: 116.89 mg/dL

## 2019-09-26 LAB — PROTIME-INR
INR: 1.3 — ABNORMAL HIGH (ref 0.8–1.2)
Prothrombin Time: 15.6 seconds — ABNORMAL HIGH (ref 11.4–15.2)

## 2019-09-26 LAB — APTT: aPTT: 34 seconds (ref 24–36)

## 2019-09-26 LAB — SARS CORONAVIRUS 2 (TAT 6-24 HRS): SARS Coronavirus 2: NEGATIVE

## 2019-09-26 LAB — SURGICAL PCR SCREEN
MRSA, PCR: NEGATIVE
Staphylococcus aureus: POSITIVE — AB

## 2019-09-26 NOTE — Progress Notes (Signed)
PCP - Dr. Crist Infante Cardiologist - Dr. Oval Linsey  Chest x-ray - today EKG - today Stress Test - 04/29/02 ECHO - 08/14/19 Cardiac Cath - 09/07/19  Blood Thinner Instructions: Eliquis last dose 09/21/19   COVID TEST- today  Anesthesia review: yes - heart hx, prostate surgery in April, 2021  Patient denies shortness of breath, fever, cough and chest pain at PAT appointment   All instructions explained to the patient, with a verbal understanding of the material. Patient agrees to go over the instructions while at home for a better understanding. Patient also instructed to self quarantine after being tested for COVID-19. The opportunity to ask questions was provided.

## 2019-09-26 NOTE — Pre-Procedure Instructions (Signed)
Raymond Ray  09/26/2019    Your procedure is scheduled on Wednesday, September 28, 2019 at 8:30 AM.   Report to [facility] Entrance "A" Admitting Office at 6:30 AM.   Call this number if you have problems the morning of surgery: [phone]   Questions prior to day of surgery, please call [phone] between 8 & 4 PM.   Remember:  Do not eat or drink after midnight Tuesday, 09/27/19.  Take these medicines the morning of surgery with A SIP OF WATER: Metoprolol (Lopressor)  Stop Eliquis as instructed by surgeon/cardiologist. Don't take Aspirin the day of surgery. Stop Vitamins as of today prior to surgery. Do not use NSAIDS (Ibuprofen, Aleve, etc), Herbal medications, other Aspirin containing products or Fish Oil prior to surgery.    Do not wear jewelry.  Do not wear lotions, powders, cologne or deodorant.  Men may shave face and neck.  Do not bring valuables to the hospital.  [facility] is not responsible for any belongings or valuables.  Contacts, dentures or bridgework may not be worn into surgery.  Leave your suitcase in the car.  After surgery it may be brought to your room.  For patients admitted to the hospital, discharge time will be determined by your treatment team.  [facility] - Preparing for Surgery  Before surgery, you can play an important role.  Because skin is not sterile, your skin needs to be as free of germs as possible.  You can reduce the number of germs on you skin by washing with CHG (chlorahexidine gluconate) soap before surgery.  CHG is an antiseptic cleaner which kills germs and bonds with the skin to continue killing germs even after washing.  Oral Hygiene is also important in reducing the risk of infection.  Remember to brush your teeth with your regular toothpaste the morning of surgery.  Please DO NOT use if you have an allergy to CHG or antibacterial soaps.  If your skin becomes reddened/irritated stop using the CHG and inform your nurse when  you arrive at Short Stay.  Do not shave (including legs and underarms) for at least 48 hours prior to the first CHG shower.  You may shave your face.  Please follow these instructions carefully:   1.  Shower with CHG Soap the night before surgery and the morning of Surgery.  2.  If you choose to wash your hair, wash your hair first as usual with your normal shampoo.  3.  After you shampoo, rinse your hair and body thoroughly to remove the shampoo. 4.  Use CHG as you would any other liquid soap.  You can apply chg directly to the skin and wash gently with a      scrungie or washcloth.           5.  Apply the CHG Soap to your body ONLY FROM THE NECK DOWN.   Do not use on open wounds or open sores. Avoid contact with your eyes, ears, mouth and genitals (private parts).  Wash genitals (private parts) with your normal soap - do this prior to using CHG soap.  6.  Wash thoroughly, paying special attention to the area where your surgery will be performed.  7.  Thoroughly rinse your body with warm water from the neck down.  8.  DO NOT shower/wash with your normal soap after using and rinsing off the CHG Soap.  9.  Pat yourself dry with a clean towel.  10.  Wear clean pajamas.            11.  Place clean sheets on your bed the night of your first shower and do not sleep with pets.  Day of Surgery  Shower as above. Do not apply any lotions/deodorants the morning of surgery.   Please wear clean clothes to the hospital. Remember to brush your teeth with toothpaste.  Please read over the fact sheets that you were given.

## 2019-09-26 NOTE — Progress Notes (Signed)
[address]       [place], [address]             [place] OFFICE NOTE  Referring Provider is Sherren Mocha, MD Primary Cardiologist is [name], MD PCP is Crist Infante, MD   HPI:  Patient is a 69 year old male with long history of mitral valve prolapse with mitral regurgitation and persistent atrial fibrillation who returns to the office today for follow-up with tentative plans to proceed with elective mitral valve repair later this week.  He was originally seen in consultation on September 08, 2019.  He reports no new problems or complaints over the last few weeks.  He stopped taking Eliquis in anticipation of his upcoming surgery.   Current Outpatient Medications  Medication Sig Dispense Refill  . hydrocortisone cream 1 % Apply 1 application topically daily.     . metoprolol tartrate (LOPRESSOR) 50 MG tablet TAKE 1/2 TABLET BY MOUTH EVERY DAY (Patient taking differently: Take 25 mg by mouth daily. ) 45 tablet 3  . oxyCODONE (OXY IR/ROXICODONE) 5 MG immediate release tablet Take 5 mg by mouth every 4 (four) hours as needed for severe pain.    Marland Kitchen oxyCODONE-acetaminophen (PERCOCET) 10-325 MG tablet Take 0.5 tablets by mouth every 4 (four) hours as needed for pain. (Patient taking differently: Take 0.5 tablets by mouth every 4 (four) hours as needed for pain. Pain.) 20 tablet 0  . polyethylene glycol (MIRALAX / GLYCOLAX) 17 g packet Take 17 g by mouth daily.    . sildenafil (VIAGRA) 100 MG tablet TAKE 1/2 TO 1  (100 MG DOSE) BY MOUTH DAILY AS NEEDED FOR ERECTILE DYSFUNCTION. (Patient taking differently: Take 100 mg by mouth every other day. ) 10 tablet 5  . zolpidem (AMBIEN CR) 12.5 MG CR tablet Take 12.5 mg by mouth at bedtime.     Marland Kitchen apixaban (ELIQUIS) 5 MG TABS tablet Take 5 mg by mouth 2 (two) times daily.  (Patient not taking: Reported on 09/26/2019)    . ascorbic acid (VITAMIN C) 500 MG tablet Take 500 mg by mouth daily.  (Patient not taking: Reported on 09/26/2019)    . Multiple Vitamins-Minerals (MULTIVITAMIN WITH MINERALS) tablet Take 1 tablet by mouth daily. [facility] men (Patient not taking: Reported on 09/26/2019)    . vitamin B-12 (CYANOCOBALAMIN) 1000 MCG tablet Take 1,000 mcg by mouth daily. (Patient not taking: Reported on 09/26/2019)     No current facility-administered medications for this visit.      Physical Exam:   BP 110/65   Pulse 85   Temp 97.8 F (36.6 C) (Skin)   Resp 20   Ht 6\' 3"  (1.905 m)   Wt 178 lb (80.7 kg)   SpO2 98% Comment: RA  BMI 22.25 kg/m   General:  Well-appearing  Chest:   Clear to auscultation  CV:   Irregular rate and rhythm with systolic murmur  Incisions:  n/a  Abdomen:  Soft nontender  Extremities:  Warm and well-perfused  Diagnostic Tests:  CT ANGIOGRAPHY CHEST, ABDOMEN AND PELVIS  TECHNIQUE: Non-contrast CT of the chest was initially obtained.  Multidetector CT imaging through the chest, abdomen and pelvis was performed using the standard protocol during bolus administration of intravenous contrast. Multiplanar reconstructed images and MIPs were obtained and reviewed to evaluate the vascular anatomy.  CONTRAST:  135mL OMNIPAQUE IOHEXOL 350 MG/ML SOLN  COMPARISON:  None.  FINDINGS: CTA CHEST FINDINGS  Cardiovascular: Left atrium  is enlarged with an AP dimension of 5.0 cm and compatible with history of mitral regurgitation. Ascending thoracic aorta is mildly aneurysmal measuring up to 4.0 cm. Bovine type aortic arch with a short trunk for the left common carotid artery and brachiocephalic artery. Right vertebral artery is dominant. Great vessels are patent. Bilateral subclavian and proximal axillary arteries are patent. Proximal descending thoracic aorta measures 2.7 cm. No significant atherosclerotic disease involving the thoracic aorta. Negative for aortic dissection. Main pulmonary arteries are patent. No significant pericardial  fluid. Coronary artery calcifications particularly in the LAD.  Mediastinum/Nodes: No mediastinal or hilar lymphadenopathy. No axillary lymph node enlargement.  Lungs/Pleura: Trachea and mainstem bronchi are patent. No pleural effusions. Lungs are clear. No consolidation or airspace disease. Minimal scarring at the lung apices.  Musculoskeletal: No acute bone abnormality.  Review of the MIP images confirms the above findings.  CTA ABDOMEN AND PELVIS FINDINGS  VASCULAR  Aorta: Atherosclerotic calcifications involving the abdominal aorta without aneurysm or dissection.  Celiac: Patent without evidence of aneurysm, dissection, vasculitis or significant stenosis.  SMA: High-grade stenosis involving the proximal SMA related to noncalcified plaque. The stenosis appears to be at least 70%. SMA is patent distal to the high-grade stenosis. There is variant anatomy with a replaced right hepatic artery coming off the proximal SMA.  Renals: Left renal artery is widely patent with mild atherosclerotic disease. No evidence for aneurysm or dissection involving the left renal artery. Atherosclerotic disease involving the proximal right renal artery with less than 50% stenosis. Negative for dissection or aneurysm involving the right renal artery. Small accessory right renal artery supplying the lower pole and originates from the distal abdominal aorta just above the bifurcation.  IMA: Patent.  Inflow: Common, external and internal iliac arteries are widely patent without any significant atherosclerotic disease. No evidence for dissection or aneurysm involving the iliac arteries.  Proximal outflow: Mild atherosclerotic disease in the left common femoral artery. Proximal femoral arteries are widely patent.  Veins: No obvious venous abnormality within the limitations of this arterial phase study.  Review of the MIP images confirms the above  findings.  NON-VASCULAR  Hepatobiliary: 2.6 cm hypodensity along the inferior right hepatic lobe measures 10 Hounsfield units. This likely represents an exophytic hepatic cyst. Normal appearance of the gallbladder.  Pancreas: Unremarkable. No pancreatic ductal dilatation or surrounding inflammatory changes.  Spleen: Normal in size without focal abnormality.  Adrenals/Urinary Tract: Normal adrenal glands. Small amount of fluid in the urinary bladder. Limited evaluation of the distal right ureter due to artifact from hip replacement. No suspicious renal lesion or hydronephrosis. Small hypodensity in the right kidney lower pole is too small to definitively characterize.  Stomach/Bowel: Stomach is decompressed. No evidence for bowel obstruction or bowel inflammation.  Lymphatic: No lymph node enlargement in the abdomen or pelvis.  Reproductive: History of prostatectomy.  Other: Trace free fluid in the pelvis. Small inguinal hernias containing fat.  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 Degenerative facet disease in lower lumbar spine.  Review of the MIP images confirms the above findings.  IMPRESSION: 1. Ascending thoracic aorta is mildly aneurysmal measuring up to 4.0 cm. No significant atherosclerotic disease in the thoracic aorta. Recommend annual imaging followup by CTA or MRA. This recommendation follows 2010 ACCF/AHA/AATS/ACR/ASA/SCA/SCAI/SIR/STS/SVM Guidelines for the Diagnosis and Management of Patients with Thoracic Aortic Disease. Circulation. 2010; 121: W119-J478. Aortic aneurysm NOS (ICD10-I71.9) 2. Mild atherosclerotic disease in the abdominal aorta without aneurysm. Aortic Atherosclerosis (ICD10-I70.0). 3. 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 4. Bilateral iliac arteries are patent without significant atherosclerotic disease or stenosis. 5. Enlarged left atrium and compatible with history  of mitral regurgitation. 6. Probable exophytic cyst in the right hepatic lobe measuring roughly 2.6 cm. 7. Trace fluid in the pelvis of unknown etiology. No evidence for acute inflammation in the abdomen or pelvis.   Electronically Signed   By: Markus Daft M.D.   On: 09/16/2019 09:19    Impression:  Patient has mitral valve prolapse with stage D severe symptomatic primary mitral regurgitation.  He describes recent progression of mild symptoms of exertional shortness of breath and fatigue that occur only with more strenuous physical exertion, consistent with chronic diastolic congestive heart failure, New York Heart Association functional class I-II.  He remains quite active physically and also recently recovered uneventfully from robotic prostatectomy without significant complications.  I have personally reviewed the patient's recent transthoracic and transesophageal echocardiograms and diagnostic cardiac catheterization.  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  There are multiple jets of regurgitation with severe mitral regurgitation including flow reversal in the pulmonary veins.  There does not appear to be any flail segments or ruptured primary chordae tendinae.  Left ventricular function is somewhat reduced with ejection fraction estimated only 50 to 55% in the setting of severe mitral regurgitation.  The right ventricle is mildly dilated and right ventricular function appears mildly reduced.  There is some dilatation of the tricuspid annulus and moderate tricuspid regurgitation.  The aortic valve appears normal.  Diagnostic cardiac catheterization is notable for the absence of significant coronary artery disease and revealed normal right heart filling pressures with preserved cardiac output.  There were large V waves on pulmonary capillary wedge tracing  consistent with severe mitral regurgitation.  I agree the patient would benefit from mitral valve repair.  Concomitant tricuspid annuloplasty may be indicated.  He might benefit from concomitant Maze procedure at least clipping of the left atrial appendage, although given the extremely long duration of longstanding permanent atrial fibrillation the likelihood of restoring sinus rhythm is probably extremely low.  Concomitant Maze procedure might be associated with improved rate control and/or development of persistent junctional rhythm, and perhaps associated with a need for permanent pacemaker placement due to chronotropic insufficiency.    Plan:  The patient and his wife were again counseled regarding the indications, risks and potential benefits of mitral valve repair.  The rationale for elective surgery has been explained, including a comparison between surgery and continued medical therapy with close follow-up.  The likelihood of successful and durable mitral valve repair has been discussed with particular reference to the findings of their recent echocardiogram.  Based upon these findings and previous experience, I have quoted them a greater than 95 percent likelihood of successful valve repair with less than 2 percent risk of mortality or major morbidity.  Alternative surgical approaches have been discussed including a comparison between conventional sternotomy and minimally-invasive techniques.  The relative risks and benefits of each have been reviewed as they pertain to the patient's specific circumstances, and expectations for the patient's postoperative convalescence has been discussed.  The relative risks and benefits of performing a maze procedure at the time of their surgery was discussed at length, including the expected likelihood of long term freedom from recurrent symptomatic atrial fibrillation and/or atrial flutter.    After considerable discussion and consultation with Dr. Caryl Comes, we have  decided to plan to proceed with clipping of the left atrial appendage if feasible but we will not plan concomitant Maze procedure at the time of mitral valve repair.  The possible indications and benefits for concomitant tricuspid annuloplasty were discussed.    The patient understands and accepts all potential risks of surgery including but not limited to risk of death, stroke or other neurologic complication, myocardial infarction, congestive heart failure, respiratory failure, renal failure, bleeding requiring transfusion and/or reexploration, arrhythmia, infection or other wound complications, pneumonia, pleural and/or pericardial effusion, pulmonary embolus, aortic dissection or other major vascular complication, or delayed complications related to valve repair or replacement including but not limited to structural valve deterioration and failure, thrombosis, embolization, endocarditis, or paravalvular leak.  Specific risks potentially related to the minimally-invasive approach were discussed at length, including but not limited to risk of conversion to full or partial sternotomy, aortic dissection or other major vascular complication, unilateral acute lung injury or pulmonary edema, phrenic nerve dysfunction or paralysis, rib fracture, chronic pain, lung hernia, or lymphocele. All of their questions have been answered.    I spent in excess of 15 minutes during the conduct of this office consultation and >50% of this time involved direct face-to-face encounter with the patient for counseling and/or coordination of their care.    Valentina Gu. Roxy Manns, MD 09/26/2019 2:01 PM

## 2019-09-26 NOTE — Patient Instructions (Signed)
Do not take Eliquis  Continue taking all other medications without change through the day before surgery.  Make sure to bring all of your medications with you when you come for your Pre-Admission Testing appointment at [facility] Short-Stay Department.  Have nothing to eat or drink after midnight the night before surgery.  On the morning of surgery take only metoprolol with a sip of water.

## 2019-09-27 ENCOUNTER — Encounter (HOSPITAL_COMMUNITY): Payer: Self-pay | Admitting: Thoracic Surgery (Cardiothoracic Vascular Surgery)

## 2019-09-27 MED ORDER — NOREPINEPHRINE 4 MG/250ML-% IV SOLN
0.0000 ug/min | INTRAVENOUS | Status: DC
  Filled 2019-09-27: qty 250

## 2019-09-27 MED ORDER — DEXMEDETOMIDINE HCL IN NACL 400 MCG/100ML IV SOLN
0.1000 ug/kg/h | INTRAVENOUS | Status: AC
  Administered 2019-09-28: .5 ug/kg/h via INTRAVENOUS
  Filled 2019-09-27: qty 100

## 2019-09-27 MED ORDER — MANNITOL 20 % IV SOLN
INTRAVENOUS | Status: DC
  Filled 2019-09-27: qty 13

## 2019-09-27 MED ORDER — TRANEXAMIC ACID (OHS) BOLUS VIA INFUSION
15.0000 mg/kg | INTRAVENOUS | Status: AC
  Administered 2019-09-28: 1210.5 mg via INTRAVENOUS
  Filled 2019-09-27: qty 1211

## 2019-09-27 MED ORDER — SODIUM CHLORIDE 0.9 % IV SOLN
INTRAVENOUS | Status: DC
  Filled 2019-09-27: qty 30

## 2019-09-27 MED ORDER — PLASMA-LYTE 148 IV SOLN
INTRAVENOUS | Status: DC
  Filled 2019-09-27: qty 2.5

## 2019-09-27 MED ORDER — NITROGLYCERIN IN D5W 200-5 MCG/ML-% IV SOLN
2.0000 ug/min | INTRAVENOUS | Status: DC
  Filled 2019-09-27: qty 250

## 2019-09-27 MED ORDER — VANCOMYCIN HCL 1000 MG IV SOLR
INTRAVENOUS | Status: DC
  Filled 2019-09-27: qty 1000

## 2019-09-27 MED ORDER — TRANEXAMIC ACID (OHS) PUMP PRIME SOLUTION
2.0000 mg/kg | INTRAVENOUS | Status: DC
  Filled 2019-09-27: qty 1.61

## 2019-09-27 MED ORDER — PHENYLEPHRINE HCL-NACL 20-0.9 MG/250ML-% IV SOLN
30.0000 ug/min | INTRAVENOUS | Status: AC
  Administered 2019-09-28: 20 ug/min via INTRAVENOUS
  Filled 2019-09-27: qty 250

## 2019-09-27 MED ORDER — VANCOMYCIN HCL 1250 MG/250ML IV SOLN
1250.0000 mg | INTRAVENOUS | Status: AC
  Administered 2019-09-28: 1250 mg via INTRAVENOUS
  Filled 2019-09-27: qty 250

## 2019-09-27 MED ORDER — INSULIN REGULAR(HUMAN) IN NACL 100-0.9 UT/100ML-% IV SOLN
INTRAVENOUS | Status: AC
  Administered 2019-09-28: 2 [IU]/h via INTRAVENOUS
  Filled 2019-09-27: qty 100

## 2019-09-27 MED ORDER — SODIUM CHLORIDE 0.9 % IV SOLN
1.5000 g | INTRAVENOUS | Status: AC
  Administered 2019-09-28: 1.5 g via INTRAVENOUS
  Filled 2019-09-27 (×2): qty 1.5

## 2019-09-27 MED ORDER — GLUTARALDEHYDE 0.625% SOAKING SOLUTION
TOPICAL | Status: DC
  Filled 2019-09-27: qty 50

## 2019-09-27 MED ORDER — MILRINONE LACTATE IN DEXTROSE 20-5 MG/100ML-% IV SOLN
0.3000 ug/kg/min | INTRAVENOUS | Status: DC
  Filled 2019-09-27: qty 100

## 2019-09-27 MED ORDER — POTASSIUM CHLORIDE 2 MEQ/ML IV SOLN
80.0000 meq | INTRAVENOUS | Status: DC
  Filled 2019-09-27: qty 40

## 2019-09-27 MED ORDER — SODIUM CHLORIDE 0.9 % IV SOLN
750.0000 mg | INTRAVENOUS | Status: AC
  Administered 2019-09-28: 750 mg via INTRAVENOUS
  Filled 2019-09-27: qty 750

## 2019-09-27 MED ORDER — EPINEPHRINE HCL 5 MG/250ML IV SOLN IN NS
0.0000 ug/min | INTRAVENOUS | Status: DC
  Filled 2019-09-27: qty 250

## 2019-09-27 MED ORDER — TRANEXAMIC ACID 1000 MG/10ML IV SOLN
1.5000 mg/kg/h | INTRAVENOUS | Status: AC
  Administered 2019-09-28: 1.5 mg/kg/h via INTRAVENOUS
  Filled 2019-09-27: qty 25

## 2019-09-27 NOTE — Anesthesia Preprocedure Evaluation (Addendum)
Anesthesia Evaluation  Patient identified by MRN, date of birth, ID band Patient awake    Reviewed: Allergy & Precautions, H&P , NPO status , Patient's Chart, lab work & pertinent test results  History of Anesthesia Complications (+) PONV  Airway Mallampati: II  TM Distance: >3 FB Neck ROM: Full    Dental no notable dental hx. (+) Teeth Intact, Dental Advisory Given   Pulmonary neg pulmonary ROS,    Pulmonary exam normal breath sounds clear to auscultation       Cardiovascular Exercise Tolerance: Good + dysrhythmias Atrial Fibrillation + Valvular Problems/Murmurs MR  Rhythm:Regular Rate:Normal     Neuro/Psych negative neurological ROS  negative psych ROS   GI/Hepatic negative GI ROS, Neg liver ROS,   Endo/Other  negative endocrine ROS  Renal/GU negative Renal ROS  negative genitourinary   Musculoskeletal  (+) Arthritis , Osteoarthritis,    Abdominal   Peds  Hematology  (+) Blood dyscrasia, anemia ,   Anesthesia Other Findings   Reproductive/Obstetrics negative OB ROS                            Anesthesia Physical Anesthesia Plan  ASA: IV  Anesthesia Plan: General   Post-op Pain Management:    Induction: Intravenous  PONV Risk Score and Plan: 3 and Midazolam, Ondansetron and Treatment may vary due to age or medical condition  Airway Management Planned: Double Lumen EBT  Additional Equipment: Arterial line, CVP, PA Cath, TEE, 3D TEE and Ultrasound Guidance Line Placement  Intra-op Plan:   Post-operative Plan: Extubation in OR and Possible Post-op intubation/ventilation  Informed Consent: I have reviewed the patients History and Physical, chart, labs and discussed the procedure including the risks, benefits and alternatives for the proposed anesthesia with the patient or authorized representative who has indicated his/her understanding and acceptance.     Dental advisory  given  Plan Discussed with: CRNA  Anesthesia Plan Comments:        Anesthesia Quick Evaluation

## 2019-09-27 NOTE — H&P (Signed)
[address]       [place] [address]             [id number]          CARDIOTHORACIC SURGERY HISTORY AND PHYSICAL EXAM  Referring Provider is Sherren Mocha, MD Primary Cardiologist is [name], MD PCP is Crist Infante, MD  Chief Complaint  Patient presents with  . Mitral Regurgitation    Surgical eval, Cardiac Cath 09/07/19, TEE 08/24/19, ECHO 08/01/19    HPI:  Patient is a 69 year old male who has been referred for surgical consultation to discuss treatment options for management of mitral valve prolapse with severe symptomatic primary mitral regurgitation and permanent atrial fibrillation.  Patient has long history of mitral valve prolapse and atrial fibrillation first diagnosed nearly 40 years ago.  He initially underwent cardioversion and was treated with quinidine and maintained sinus rhythm for more than 10 years.  He later developed recurrent persistent atrial fibrillation for which she has been treated with long-term anticoagulation and rate control.  He was followed for many years by Dr. Mare Ferrari and more recently he has been followed by Dr. Oval Linsey.  He states that his only been during the last several years that he has known of presence of a heart murmur.  Echocardiogram performed in 2011 reportedly revealed holosystolic mitral valve prolapse with moderate mitral regurgitation.  Follow-up echocardiogram January 2020 revealed mitral valve prolapse with moderate mitral regurgitation and normal left ventricular systolic function with ejection fraction estimated 60 to 65%.    Patient recently developed prostate cancer for which he underwent robotic prostatectomy in April 2021.  He recovered uneventfully and did not have problems with congestive heart failure during his brief hospitalization.  He does complain of exertional shortness of breath with more strenuous exertion, such as walking up a hill or walking on an elliptical trainer at a brisk  pace.  He also reports that he gets short of breath walking up a flight of stairs.  Recent follow-up transthoracic echocardiogram revealed mitral valve prolapse with "moderate to severe mitral regurgitation" with drop in left ventricular systolic function and ejection fraction estimated only 50 to 55%.  Transesophageal echocardiogram was performed August 24, 2019 and confirmed the presence of bileaflet prolapse with severe mitral regurgitation and mildly reduced left ventricular systolic function.  There was no definite flail segment in either leaflet but there was severe mitral regurgitation with systolic flow reversal in the pulmonary veins.  Ejection fraction was estimated 50 to 55%.  There was moderate right ventricular systolic dysfunction with mild right ventricular chamber enlargement and moderate tricuspid regurgitation.  Patient was referred to the multidisciplinary heart valve clinic and has been evaluated previously by Dr. Burt Knack.  Diagnostic cardiac catheterization was performed and revealed nonobstructive calcified plaque in the left anterior descending coronary artery with otherwise minimal nonobstructive coronary artery disease.  Right heart pressures were normal although there was a large V wave and pulmonary capillary wedge tracing consistent with severe mitral regurgitation.  Cardiothoracic surgical consultation was requested.  Patient is married and lives locally in [place] with his wife.  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  He remains physically active and exercises on a regular basis.  He admits to exertional shortness of breath with more strenuous physical exertion as noted previously.  He denies any history of resting shortness of breath, PND, orthopnea, palpitations, dizzy spells, or  syncope.  He does get some lower extremity edema, typically worse at the end of the day.  He has been  chronically anticoagulated for many years and was on Coumadin for a long time but more recently has been anticoagulated using Eliquis.  He has not had any significant bleeding complications and reports only occasional minor epistaxis.  He has never had any history of TIA or stroke.  He does not experience palpitations and he has not had problems with tachybradycardia symptoms nor issues with rate control.  Patient returns to the office today for follow-up with tentative plans to proceed with elective mitral valve repair later this week.  He was originally seen in consultation on September 08, 2019.  He reports no new problems or complaints over the last few weeks.  He stopped taking Eliquis in anticipation of his upcoming surgery.  Past Medical History:  Diagnosis Date  . Allergy   . Anemia   . Chronic atrial fibrillation ([place])   . Heart murmur   . History of colonoscopy 04/2001   negative  . Mitral valve prolapse   . Nonrheumatic mitral (valve) insufficiency   . Osteoarthritis of hip   . Permanent atrial fibrillation ([place])   . PONV (postoperative nausea and vomiting)    left knee cartilage removal;    . Prostate cancer ([place])   . Tricuspid regurgitation   . Vertigo 01/07/2017   currently not symptomatic     Past Surgical History:  Procedure Laterality Date  . BUBBLE STUDY  08/24/2019   Procedure: BUBBLE STUDY;  Surgeon: [name], MD;  Location: [facility];  Service: Cardiology;;  . COLONOSCOPY    . KNEE SURGERY     left at age 40 in [place]  . LYMPHADENECTOMY Bilateral 06/02/2019   Procedure: LYMPHADENECTOMY, PELVIC;  Surgeon: [name], MD;  Location: [facility];  Service: Urology;  Laterality: Bilateral;  . PROSTATE BIOPSY    . RIGHT/LEFT HEART CATH AND CORONARY ANGIOGRAPHY N/A 09/07/2019   Procedure: RIGHT/LEFT HEART CATH AND CORONARY ANGIOGRAPHY;  Surgeon: Sherren Mocha, MD;  Location: [facility];  Service: Cardiovascular;  Laterality: N/A;  . ROBOT ASSISTED  LAPAROSCOPIC RADICAL PROSTATECTOMY N/A 06/02/2019   Procedure: XI ROBOTIC ASSISTED LAPAROSCOPIC RADICAL PROSTATECTOMY LEVEL 2;  Surgeon: [name], MD;  Location: [facility];  Service: Urology;  Laterality: N/A;  . spinal injection     December 2013  . TEE WITHOUT CARDIOVERSION N/A 08/24/2019   Procedure: TRANSESOPHAGEAL ECHOCARDIOGRAM (TEE);  Surgeon: [name], MD;  Location: [facility];  Service: Cardiology;  Laterality: N/A;  . TOTAL HIP ARTHROPLASTY Right 01/29/2017   Procedure: RIGHT TOTAL HIP ARTHROPLASTY ANTERIOR APPROACH;  Surgeon: Rod Can, MD;  Location: [facility];  Service: Orthopedics;  Laterality: Right;  Marland Kitchen VASECTOMY      Family History  Problem Relation Age of Onset  . Arrhythmia Father        tachycardia  . Cancer Mother   . Heart attack Mother   . Diabetes Mother   . Breast cancer Sister   . Breast cancer Paternal Aunt   . Colon cancer Neg Hx   . Esophageal cancer Neg Hx   . Stomach cancer Neg Hx   . Rectal cancer Neg Hx     Social History Social History   Tobacco Use  . Smoking status: Never Smoker  . Smokeless tobacco: Never Used  Vaping Use  . Vaping Use: Never used  Substance Use Topics  . Alcohol use: No  .  Drug use: No    Prior to Admission medications   Medication Sig Start Date End Date Taking? Authorizing Provider  apixaban (ELIQUIS) 5 MG TABS tablet Take 5 mg by mouth 2 (two) times daily.  Patient not taking: Reported on 09/26/2019   Yes [provider]  ascorbic acid (VITAMIN C) 500 MG tablet Take 500 mg by mouth daily. Patient not taking: Reported on 09/26/2019   Yes [provider]  hydrocortisone cream 1 % Apply 1 application topically daily.    Yes [provider]  metoprolol tartrate (LOPRESSOR) 50 MG tablet TAKE 1/2 TABLET BY MOUTH EVERY DAY Patient taking differently: Take 25 mg by mouth daily.  03/17/19  Yes [name], MD  Multiple Vitamins-Minerals (MULTIVITAMIN WITH MINERALS) tablet Take 1  tablet by mouth daily. [facility] men Patient not taking: Reported on 09/26/2019   Yes [provider]  oxyCODONE (OXY IR/ROXICODONE) 5 MG immediate release tablet Take 5 mg by mouth every 4 (four) hours as needed for severe pain.   Yes [provider]  oxyCODONE-acetaminophen (PERCOCET) 10-325 MG tablet Take 0.5 tablets by mouth every 4 (four) hours as needed for pain. Patient taking differently: Take 0.5 tablets by mouth every 4 (four) hours as needed for pain. Pain. 06/02/19  Yes Dancy, Amanda, PA-C  polyethylene glycol (MIRALAX / GLYCOLAX) 17 g packet Take 17 g by mouth daily.   Yes [provider]  sildenafil (VIAGRA) 100 MG tablet TAKE 1/2 TO 1  (100 MG DOSE) BY MOUTH DAILY AS NEEDED FOR ERECTILE DYSFUNCTION. Patient taking differently: Take 100 mg by mouth every other day.  01/07/18  Yes [name], MD  vitamin B-12 (CYANOCOBALAMIN) 1000 MCG tablet Take 1,000 mcg by mouth daily. Patient not taking: Reported on 09/26/2019   Yes [provider]  zolpidem (AMBIEN CR) 12.5 MG CR tablet Take 12.5 mg by mouth at bedtime.    Yes [provider]    No Known Allergies     Review of Systems:              General:                      normal appetite, decreased energy, no weight gain, no weight loss, no fever             Cardiac:                       no chest pain with exertion, no chest pain at rest, +SOB with more strenuous exertion, no resting SOB, no PND, no orthopnea, no palpitations, + arrhythmia, + atrial fibrillation, + LE edema, no dizzy spells, no syncope             Respiratory:                 no shortness of breath, no home oxygen, no productive cough, no dry cough, no bronchitis, no wheezing, no hemoptysis, no asthma, no pain with inspiration or cough, no sleep apnea, no CPAP at night             GI:                               no difficulty swallowing, no reflux, no frequent heartburn, no hiatal hernia, no abdominal pain, no constipation,  no diarrhea, no hematochezia, no hematemesis, no melena  GU:                              no dysuria,  no frequency, no urinary tract infection, no hematuria, no enlarged prostate, no kidney stones, no kidney disease             Vascular:                     no pain suggestive of claudication, + pain in feet, no leg cramps, + varicose veins, no DVT, no non-healing foot ulcer             Neuro:                         no stroke, no TIA's, no seizures, no headaches, no temporary blindness one eye,  no slurred speech, no peripheral neuropathy, no chronic pain, no instability of gait, no memory/cognitive dysfunction             Musculoskeletal:         + arthritis, no joint swelling, no myalgias, no difficulty walking, normal mobility              Skin:                            no rash, no itching, no skin infections, no pressure sores or ulcerations             Psych:                         no anxiety, no depression, no nervousness, no unusual recent stress             Eyes:                           no blurry vision, no floaters, no recent vision changes, does not wear glasses or contacts             ENT:                            no hearing loss, no loose or painful teeth, no dentures, last saw dentist February 2021             Hematologic:               + easy bruising, no abnormal bleeding, no clotting disorder, no frequent epistaxis             Endocrine:                   no diabetes, does not check CBG's at home                           Physical Exam:              BP 114/75 (BP Location: Left Arm, Patient Position: Sitting, Cuff Size: Normal)   Pulse 83   Temp 98.1 F (36.7 C)   Resp 16   Ht 6\' 3"  (1.905 m)   Wt 175 lb (79.4 kg)   SpO2 99% Comment: RA  BMI 21.87 kg/m              General:  well-appearing             HEENT:                       Unremarkable              Neck:                           no JVD, no bruits, no adenopathy               Chest:                          clear to auscultation, symmetrical breath sounds, no wheezes, no rhonchi              CV:                              Irregular rate and rhythm, grade III/VI holosystolic murmur best at apex             Abdomen:                    soft, non-tender, no masses              Extremities:                 warm, well-perfused, pulses diminished, mild LE edema             Rectal/GU                   Deferred             Neuro:                         Grossly non-focal and symmetrical throughout             Skin:                            Clean and dry, no rashes, no breakdown   Diagnostic Tests:  ECHOCARDIOGRAM REPORT       Patient Name:  BANKS CHAIKIN Date of Exam: 08/01/2019  Medical Rec #: [id number]      Height:    75.0 in  Accession #:  [id number]     Weight:    177.0 lb  Date of Birth: [birth year]      BSA:     2.084 m  Patient Age:  58 years      BP:      111/75 mmHg  Patient Gender: M          HR:      81 bpm.  Exam Location: [address]   Procedure: 2D Echo, Cardiac Doppler and Color Doppler   Indications:  I05.9 Mitral valve disorder    History:    Patient has prior history of Echocardiogram examinations,  most         recent 03/05/2018. Mitral Valve Prolapse. Non-rheumatic  mitral         valve regurgitation. Chronic atrial fibrillation. Iron         deficiency anemia.    Sonographer:  Diamond Nickel RCS  Referring Phys: [phone] [facility] [place]   IMPRESSIONS    1. Left ventricular ejection fraction, by estimation, is 50  to 55%. The  left ventricle has low normal function. The left ventricle has no regional  wall motion abnormalities. Left ventricular diastolic function could not  be evaluated.  2. Right ventricular systolic function is normal. The right ventricular  size is normal. There is moderately elevated pulmonary artery  systolic  pressure.  3. Left atrial size was severely dilated.  4. Right atrial size was severely dilated.  5. The mitral valve is myxomatous. Moderate to severe mitral valve  regurgitation.  6. Tricuspid valve regurgitation is mild to moderate.  7. The aortic valve is normal in structure. Aortic valve regurgitation is  not visualized. No aortic stenosis is present.  8. Aortic dilatation noted. 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   FINDINGS  Left Ventricle: Left ventricular ejection fraction, by estimation, is 50  to 55%. The left ventricle has low normal function. The left ventricle has  no regional wall motion abnormalities. The left ventricular internal  cavity size was normal in size.  There is borderline left ventricular hypertrophy. Left ventricular  diastolic function could not be evaluated due to atrial fibrillation. Left  ventricular diastolic function could not be evaluated.   Right Ventricle: The right ventricular size is normal. No increase in  right ventricular wall thickness. Right ventricular systolic function is  normal. There is moderately elevated pulmonary artery systolic pressure.  The tricuspid regurgitant velocity is  3.05 m/s, and with an assumed right atrial pressure of 10 mmHg, the  estimated right ventricular systolic pressure is 62.8 mmHg.   Left Atrium: Left atrial size was severely dilated.   Right Atrium: Right atrial size was severely dilated.   Pericardium: There is no evidence of pericardial effusion.   Mitral Valve: The mitral valve is myxomatous. There is mild late systolic  prolapse of of the mitral valve. Moderate to severe mitral valve  regurgitation.   Tricuspid Valve: The tricuspid valve is normal in structure. Tricuspid  valve regurgitation is mild to moderate. No evidence of tricuspid  stenosis.   Aortic Valve: The aortic valve is normal in structure. Aortic valve  regurgitation is not  visualized. No aortic stenosis is present.   Pulmonic Valve: The pulmonic valve was normal in structure. Pulmonic valve  regurgitation is trivial. No evidence of pulmonic stenosis.   Aorta: Aortic dilatation noted. 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   IAS/Shunts: The atrial septum is grossly normal.     LEFT VENTRICLE  PLAX 2D  LVIDd:     4.50 cm  LVIDs:     2.70 cm  LV PW:     1.30 cm  LV IVS:    1.00 cm  LVOT diam:   2.25 cm  LV SV:     65  LV SV Index:  31  LVOT Area:   3.98 cm     RIGHT VENTRICLE  RV Basal diam: 4.00 cm  RV S prime:   11.54 cm/s  TAPSE (M-mode): 1.1 cm  RVSP:      45.2 mmHg   LEFT ATRIUM       Index    RIGHT ATRIUM      Index  LA diam:    5.30 cm 2.54 cm/m RA Pressure: 8.00 mmHg  LA Vol (A2C):  142.0 ml 68.15 ml/m RA Area:   29.90 cm  LA Vol (A4C):  84.4 ml 40.51 ml/m RA Volume:  109.00 ml 52.32 ml/m  LA Biplane Vol: 109.0 ml  52.32 ml/m  AORTIC VALVE  LVOT Vmax:  89.33 cm/s  LVOT Vmean: 54.300 cm/s  LVOT VTI:  0.165 m    AORTA  Ao Root diam: 3.70 cm   MR Peak grad:  97.4 mmHg  TRICUSPID VALVE  MR Mean grad:  65.0 mmHg  TR Peak grad:  37.2 mmHg  MR Vmax:     493.50 cm/s TR Vmax:    305.00 cm/s  MR Vmean:    378.2 cm/s Estimated RAP: 8.00 mmHg  MR PISA:     6.28 cm  RVSP:      45.2 mmHg  MR PISA Eff ROA: 47 mm  MR PISA Radius: 1.00 cm   SHUNTS                Systemic VTI: 0.16 m                Systemic Diam: 2.25 cm   Mertie Moores MD  Electronically signed by Mertie Moores MD  Signature Date/Time: 08/01/2019/4:13:54 PM       TRANSESOPHOGEAL ECHO REPORT       Patient Name:  [name] Date of Exam: 08/24/2019  Medical Rec #: [id number]      Height:    75.0 in  Accession #:  [id number]     Weight:    177.0 lb  Date of Birth:  Jun 10, 1950      BSA:     2.083 m  Patient Age:  31 years      BP:      109/80 mmHg  Patient Gender: M          HR:      82 bpm.  Exam Location: Inpatient   Procedure: 3D Echo, Transesophageal Echo, Cardiac Doppler, Color Doppler  and       Saline Contrast Bubble Study   Indications:   I34.1 Nonrheumatic mitral (valve) prolapse; I34.0  Nonrheumatic          mitral (valve) insufficiency    History:     Patient has prior history of Echocardiogram examinations,  most          recent 08/01/2019. Abnormal ECG, Mitral Valve Disease;          Arrythmias:Atrial Fibrillation.    Sonographer:   Roseanna Rainbow RDCS  Referring Phys: [place]  Diagnosing Phys: Cherlynn Kaiser MD   PROCEDURE: After discussion of the risks and benefits of a TEE, an  informed consent was obtained from the patient. TEE procedure time was 31  minutes. The transesophogeal probe was passed without difficulty through  the esophogus of the patient. Imaged  were obtained with the patient in a left lateral decubitus position. Local  oropharyngeal anesthetic was provided with Cetacaine. Sedation performed  by performing physician. Patients was under conscious sedation during this  procedure. Anesthetic  administered: 216mcg of Fentanyl, 10mg  of Versed. Image quality was good.  The patient's vital signs; including heart rate, blood pressure, and  oxygen saturation; remained stable throughout the procedure. The patient  developed no complications during the  procedure. Assitance on probe placement obtained from Dr. [name],  anesthesiologist. Esophageal intubation was somewhat challenging at the  level of the oropharynx under moderate sedation, ingested air limited  transgastric image quality. Components of  study were limited due to minimize patient discomfort. Unable to obtain 3D  of tricuspid valve.   IMPRESSIONS    1.  Bileaflet mitral valve prolapse with cleft bewteen P1 and P2 scallops  of posterior mitral  leaflet. P2 and P3 scallops demonstrate greatest  degree of prolapse. Severe mitral valve regurgitation with at least 3  jets. No definite flail component.  Systolic reversal and systolic blunting in the pulmonary veins. The mitral  valve is myxomatous. Severe mitral valve regurgitation.  2. Left ventricular ejection fraction, by estimation, is 50 to 55%. The  left ventricle has low normal function.  3. Right ventricular systolic function is moderately reduced. The right  ventricular size is mildly enlarged.  4. Left atrial size was severely dilated. No left atrial/left atrial  appendage thrombus was detected.  5. Right atrial size was severely dilated.  6. Prolapse of tricuspid valve leaflets, likely all three leaflets. The  tricuspid valve is myxomatous. Tricuspid valve regurgitation is moderate.  7. Aortic dilatation noted. There is borderline dilatation of the aortic  root measuring 39 mm. There is focal moderate (Grade III) atheroma plaque  involving the transverse aorta.  8. Agitated saline contrast bubble study was negative, with no evidence  of any interatrial shunt.  9. The aortic valve is tricuspid. Aortic valve regurgitation is trivial.   FINDINGS  Left Ventricle: Left ventricular ejection fraction, by estimation, is 50  to 55%. The left ventricle has low normal function. The left ventricular  internal cavity size was normal in size.   Right Ventricle: The right ventricular size is mildly enlarged. No  increase in right ventricular wall thickness. Right ventricular systolic  function is moderately reduced.   Left Atrium: Left atrial size was severely dilated. No left atrial/left  atrial appendage thrombus was detected.   Right Atrium: Right atrial size was severely dilated.   Pericardium: There is no evidence of pericardial effusion.   Mitral Valve: Bileaflet mitral  valve prolapse with cleft bewteen P1 and P2  scallops of posterior mitral leaflet. P2 and P3 scallops demonstrate  greatest degree of prolapse. Severe mitral valve regurgitation with at  least 3 jets. No definite flail  component. Systolic reversal and systolic blunting in the pulmonary veins.  The mitral valve is myxomatous. Severe mitral valve regurgitation.   Tricuspid Valve: Prolapse of tricuspid valve leaflets, likely all three  leaflets. The tricuspid valve is myxomatous. Tricuspid valve regurgitation  is moderate.   Aortic Valve: The aortic valve is tricuspid. Aortic valve regurgitation is  trivial.   Pulmonic Valve: The pulmonic valve was normal in structure. Pulmonic valve  regurgitation is trivial.   Aorta: Aortic dilatation noted. There is borderline dilatation of the  aortic root measuring 39 mm. There is moderate (Grade III) atheroma plaque  involving the transverse aorta.   IAS/Shunts: No atrial level shunt detected by color flow Doppler. Agitated  saline contrast was given intravenously to evaluate for intracardiac  shunting. Agitated saline contrast bubble study was negative, with no  evidence of any interatrial shunt.     LEFT VENTRICLE  PLAX 2D  LVOT diam:   2.20 cm  LVOT Area:   3.80 cm     MR Peak grad:  109.8 mmHg  MR Mean grad:  68.0 mmHg  SHUNTS  MR Vmax:    524.00 cm/s Systemic Diam: 2.20 cm  MR Vmean:    378.0 cm/s  MR PISA:    5.09 cm  MR PISA Radius: 0.90 cm   Cherlynn Kaiser MD  Electronically signed by Cherlynn Kaiser MD  Signature Date/Time: 08/25/2019/9:02:12 AM   RIGHT/LEFT HEART CATH AND CORONARY ANGIOGRAPHY  Conclusion  1. Calcified nonobstructive proximal LAD stenosis 2. Patent coronary arteries with mild diffuse irregularity and  no flow-limiting coronary stenoses 3. Normal right heart filling pressures with preserved cardiac output 4. 20 mm V wave consistent with the patient's known mitral  regurgitation  Recommendation: Continued plans for cardiac surgical evaluation for mitral valve repair Surgeon Notes    08/24/2019 10:18 AM [facility] Procedure signed by [name], MD  Indications  Nonrheumatic mitral valve insufficiency [I34.0 (ICD-10-CM)]  Procedural Details  Technical Details INDICATION: Severe nonrheumatic mitral regurgitation  PROCEDURAL DETAILS: There was an indwelling IV in a right antecubital vein. Using normal sterile technique, the IV was changed out for a 5 Fr brachial sheath over a 0.018 inch wire. The right wrist was then prepped, draped, and anesthetized with 1% lidocaine. Using direct ultrasound guidance a 5/6 French Slender sheath was placed in the right radial artery. Ultrasound images are captured and stored in the patient's chart. Intra-arterial verapamil was administered through the radial artery sheath. IV heparin was administered after a JR4 catheter was advanced into the central aorta. A Swan-Ganz catheter was used for the right heart catheterization. Standard protocol was followed for recording of right heart pressures and sampling of oxygen saturations. Fick cardiac output was calculated. Standard Judkins catheters were used for selective coronary angiography. LV pressure is recorded and an aortic valve pullback is performed. There were no immediate procedural complications. The patient was transferred to the post catheterization recovery area for further monitoring.    Estimated blood loss <50 mL.   During this procedure medications were administered to achieve and maintain moderate conscious sedation while the patient's heart rate, blood pressure, and oxygen saturation were continuously monitored and I was present face-to-face 100% of this time.  Medications (Filter: Administrations occurring from [phone] to 0933 on 09/07/19) (important) Continuous medications are totaled by the amount administered until 09/07/19 0933.  Heparin (Porcine) in  NaCl 1000-0.9 UT/500ML-% SOLN (mL) Total volume:  1,000 mL  Date/Time  Rate/Dose/Volume Action  09/07/19 0824  500 mL Given  0825  500 mL Given    fentaNYL (SUBLIMAZE) injection (mcg) Total dose:  50 mcg  Date/Time  Rate/Dose/Volume Action  09/07/19 0835  25 mcg Given  0845  25 mcg Given    midazolam (VERSED) injection (mg) Total dose:  2 mg  Date/Time  Rate/Dose/Volume Action  09/07/19 0835  1 mg Given  0845  1 mg Given    lidocaine (PF) (XYLOCAINE) 1 % injection (mL) Total volume:  5 mL  Date/Time  Rate/Dose/Volume Action  09/07/19 0847  5 mL Given    Radial Cocktail/Verapamil only (mL) Total volume:  10 mL  Date/Time  Rate/Dose/Volume Action  09/07/19 0909  10 mL Given    heparin sodium (porcine) injection (Units) Total dose:  4,000 Units  Date/Time  Rate/Dose/Volume Action  09/07/19 0911  4,000 Units Given    iohexol (OMNIPAQUE) 350 MG/ML injection (mL) Total volume:  60 mL  Date/Time  Rate/Dose/Volume Action  09/07/19 0924  60 mL Given    Sedation Time  Sedation Time Physician-1: 48 minutes 7 seconds  Contrast  Medication Name Total Dose  iohexol (OMNIPAQUE) 350 MG/ML injection 60 mL    Radiation/Fluoro  Fluoro time: 9.6 (min) DAP: 12.1 (Gycm2) Cumulative Air Kerma: 248.9 (mGy)  Coronary Findings  Diagnostic Dominance: Co-dominant Left Anterior Descending  There is mild diffuse disease throughout the vessel. The LAD wraps around the LV apex. The vessel is large in caliber with mild diffuse irregularity. There is a calcified plaque in the proximal LAD before the first septal perforator with 35% stenosis present.  There is no high-grade obstructive disease throughout the LAD territory.  Prox LAD lesion is 35% stenosed. The lesion is severely calcified.  Ramus Intermedius  Vessel is moderate in size. The ramus intermedius trifurcates and has no significant stenosis throughout its distribution. The vessel is medium in caliber.   Left Circumflex  Vessel is moderate in size. The vessel exhibits minimal luminal irregularities. The circumflex is medium in caliber with mild irregularity noted.  Right Coronary Artery  There is mild diffuse disease throughout the vessel. There are mild coronary irregularities noted with up to 25% stenosis in the mid vessel. This is a large, dominant vessel.  Intervention  No interventions have been documented. Coronary Diagrams  Diagnostic Dominance: Co-dominant  Intervention  Implants      No implant documentation for this case.  Syngo Images  Show images for CARDIAC CATHETERIZATION Images on Long Term Storage  Show images for Basim, Bartnik to Procedure Log  Procedure Log    Hemo Data   Most Recent Value  Fick Cardiac Output 6.19 L/min  Fick Cardiac Output Index 2.99 (L/min)/BSA  RA A Wave 6 mmHg  RA V Wave 7 mmHg  RA Mean 5 mmHg  RV Systolic Pressure 25 mmHg  RV Diastolic Pressure 2 mmHg  RV EDP 4 mmHg  PA Systolic Pressure 27 mmHg  PA Diastolic Pressure 9 mmHg  PA Mean 17 mmHg  PW A Wave 13 mmHg  PW V Wave 20 mmHg  PW Mean 13 mmHg  AO Systolic Pressure 010 mmHg  AO Diastolic Pressure 68 mmHg  AO Mean 88 mmHg  LV Systolic Pressure 272 mmHg  LV Diastolic Pressure 6 mmHg  LV EDP 11 mmHg  AOp Systolic Pressure 536 mmHg  AOp Diastolic Pressure 61 mmHg  AOp Mean Pressure 80 mmHg  LVp Systolic Pressure 644 mmHg  LVp Diastolic Pressure 6 mmHg  LVp EDP Pressure 11 mmHg  QP/QS 1  TPVR Index 5.68 HRUI  TSVR Index 26.07 HRUI  PVR SVR Ratio 0.05  TPVR/TSVR Ratio 0.22      CT ANGIOGRAPHY CHEST, ABDOMEN AND PELVIS  TECHNIQUE: Non-contrast CT of the chest was initially obtained.  Multidetector CT imaging through the chest, abdomen and pelvis was performed using the standard protocol during bolus administration of intravenous contrast. Multiplanar reconstructed images and MIPs were obtained and reviewed to evaluate the vascular  anatomy.  CONTRAST: 161mL OMNIPAQUE IOHEXOL 350 MG/ML SOLN  COMPARISON: None.  FINDINGS: CTA CHEST FINDINGS  Cardiovascular: Left atrium is enlarged with an AP dimension of 5.0 cm and compatible with history of mitral regurgitation. Ascending thoracic aorta is mildly aneurysmal measuring up to 4.0 cm. Bovine type aortic arch with a short trunk for the left common carotid artery and brachiocephalic artery. Right vertebral artery is dominant. Great vessels are patent. Bilateral subclavian and proximal axillary arteries are patent. Proximal descending thoracic aorta measures 2.7 cm. No significant atherosclerotic disease involving the thoracic aorta. Negative for aortic dissection. Main pulmonary arteries are patent. No significant pericardial fluid. Coronary artery calcifications particularly in the LAD.  Mediastinum/Nodes: No mediastinal or hilar lymphadenopathy. No axillary lymph node enlargement.  Lungs/Pleura: Trachea and mainstem bronchi are patent. No pleural effusions. Lungs are clear. No consolidation or airspace disease. Minimal scarring at the lung apices.  Musculoskeletal: No acute bone abnormality.  Review of the MIP images confirms the above findings.  CTA ABDOMEN AND PELVIS FINDINGS  VASCULAR  Aorta: Atherosclerotic calcifications involving the abdominal aorta without aneurysm or dissection.  Celiac: Patent without  evidence of aneurysm, dissection, vasculitis or significant stenosis.  SMA: High-grade stenosis involving the proximal SMA related to noncalcified plaque. The stenosis appears to be at least 70%. SMA is patent distal to the high-grade stenosis. There is variant anatomy with a replaced right hepatic artery coming off the proximal SMA.  Renals: Left renal artery is widely patent with mild atherosclerotic disease. No evidence for aneurysm or dissection involving the left renal artery. Atherosclerotic disease involving the proximal  right renal artery with less than 50% stenosis. Negative for dissection or aneurysm involving the right renal artery. Small accessory right renal artery supplying the lower pole and originates from the distal abdominal aorta just above the bifurcation.  IMA: Patent.  Inflow: Common, external and internal iliac arteries are widely patent without any significant atherosclerotic disease. No evidence for dissection or aneurysm involving the iliac arteries.  Proximal outflow: Mild atherosclerotic disease in the left common femoral artery. Proximal femoral arteries are widely patent.  Veins: No obvious venous abnormality within the limitations of this arterial phase study.  Review of the MIP images confirms the above findings.  NON-VASCULAR  Hepatobiliary: 2.6 cm hypodensity along the inferior right hepatic lobe measures 10 Hounsfield units. This likely represents an exophytic hepatic cyst. Normal appearance of the gallbladder.  Pancreas: Unremarkable. No pancreatic ductal dilatation or surrounding inflammatory changes.  Spleen: Normal in size without focal abnormality.  Adrenals/Urinary Tract: Normal adrenal glands. Small amount of fluid in the urinary bladder. Limited evaluation of the distal right ureter due to artifact from hip replacement. No suspicious renal lesion or hydronephrosis. Small hypodensity in the right kidney lower pole is too small to definitively characterize.  Stomach/Bowel: Stomach is decompressed. No evidence for bowel obstruction or bowel inflammation.  Lymphatic: No lymph node enlargement in the abdomen or pelvis.  Reproductive: History of prostatectomy.  Other: Trace free fluid in the pelvis. Small inguinal hernias containing fat.  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 Degenerative facet disease in lower lumbar spine.  Review of the MIP images confirms the above findings.  IMPRESSION: 1. 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 No significant atherosclerotic disease in the thoracic aorta. Recommend annual imaging followup by CTA or MRA. This recommendation follows 2010 ACCF/AHA/AATS/ACR/ASA/SCA/SCAI/SIR/STS/SVM Guidelines for the Diagnosis and Management of Patients with Thoracic Aortic Disease. Circulation. 2010; 121: D782-U235. Aortic aneurysm NOS (ICD10-I71.9) 2. Mild atherosclerotic disease in the abdominal aorta without aneurysm. Aortic Atherosclerosis (ICD10-I70.0). 3. 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 4. Bilateral iliac arteries are patent without significant atherosclerotic disease or stenosis. 5. Enlarged left atrium and compatible with history of mitral regurgitation. 6. Probable exophytic cyst in the right hepatic lobe measuring roughly 2.6 cm. 7. Trace fluid in the pelvis of unknown etiology. No evidence for acute inflammation in the abdomen or pelvis.   Electronically Signed By: Markus Daft M.D. On: 09/16/2019 09:19    Impression:  Patient has mitral valve prolapse with stage D severe symptomatic primary mitral regurgitation. He describes recent progression of mild symptoms of exertional shortness of breath and fatigue that occur only with more strenuous physical exertion,consistent with chronic diastolic congestive heart failure, New [place] Heart Association functional class I-II. He remains quite active physically and also recently recovered uneventfully from robotic prostatectomy without significant complications.  I have personally reviewed the patient's recent transthoracic and transesophageal echocardiograms and diagnostic cardiac catheterization. 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 There are multiple jets of regurgitation with severe mitral  regurgitation including flow reversal in the pulmonary veins. There does not appear to be any flail segments or ruptured primary chordae tendinae.Left ventricular function is somewhat reduced with ejection fraction estimated only 50 to 55% in the setting of severe mitral regurgitation. The right ventricle is mildly dilated and right ventricular function appears mildly reduced. There is some dilatation of the tricuspid annulus and moderate tricuspid regurgitation. The aortic valve appears normal. Diagnostic cardiac catheterization is notable for the absence of significant coronary artery disease and revealed normal right heart filling pressures with preserved cardiac output. There were large V waves on pulmonary capillary wedge tracing consistent with severe mitral regurgitation.  I agree the patient would benefit from mitral valve repair. Concomitant tricuspid annuloplasty may be indicated. He might benefit from concomitant Maze procedure at least clipping of the left atrial appendage, although given the extremely long duration of longstanding permanent atrial fibrillation the likelihood of restoring sinus rhythm is probably extremely low. Concomitant Maze procedure might be associated with improved rate control and/or development of persistent junctional rhythm, and perhaps associated with a need for permanent pacemaker placement due to chronotropic insufficiency.    Plan:  The patientand his wife were againcounseled regarding the indications, risks and potential benefits of mitral valve repair. The rationale for elective surgery has been explained, including a comparison between surgery and continued medical therapy with close follow-up. The likelihood of successful and durable mitral valve repair has been discussed with particular reference to the findings of their recent echocardiogram. Based upon these findings and previous experience, I have quoted them a greater than 95percent  likelihood of successful valve repair with less than 2percent risk of mortality or major morbidity. Alternative surgical approaches have been discussed including a comparison between conventional sternotomy and minimally-invasive techniques. The relative risks and benefits of each have been reviewed as they pertain to the patient's specific circumstances, and expectations for the patient's postoperative convalescence has been discussed. The relative risks and benefits of performing a maze procedure at the time of their surgery was discussed at length, including the expected likelihood of long term freedom from recurrent symptomatic atrial fibrillation and/or atrial flutter.   After considerable discussion and consultation with Dr. Caryl Comes, we have decided to plan to proceed with clipping of the left atrial appendage if feasible but we will not plan concomitant Maze procedure at the time of mitral valve repair.  The possible indications and benefits for concomitant tricuspid annuloplasty were discussed.  The patient understands and accepts all potential risks of surgery including but not limited to risk of death, stroke or other neurologic complication, myocardial infarction, congestive heart failure, respiratory failure, renal failure, bleeding requiring transfusion and/or reexploration, arrhythmia, infection or other wound complications, pneumonia, pleural and/or pericardial effusion, pulmonary embolus, aortic dissection or other major vascular complication, or delayed complications related to valve repair or replacement including but not limited to structural valve deterioration and failure, thrombosis, embolization, endocarditis, or paravalvular leak.  Specific risks potentially related to the minimally-invasive approach were discussed at length, including but not limited to risk of conversion to full or partial sternotomy, aortic dissection or other major vascular complication, unilateral acute lung  injury or pulmonary edema, phrenic nerve dysfunction or paralysis, rib fracture, chronic pain, lung hernia, or lymphocele. All of their questions have been answered.     Valentina Gu. Roxy Manns, MD 09/26/2019 2:01 PM

## 2019-09-28 ENCOUNTER — Inpatient Hospital Stay (HOSPITAL_COMMUNITY): Payer: Medicare Other | Admitting: Physician Assistant

## 2019-09-28 ENCOUNTER — Inpatient Hospital Stay (HOSPITAL_COMMUNITY)
Admission: RE | Admit: 2019-09-28 | Discharge: 2019-10-04 | DRG: 219 | Disposition: A | Discharge status: Home / Self Care | Payer: Medicare Other | Attending: Thoracic Surgery (Cardiothoracic Vascular Surgery) | Admitting: Thoracic Surgery (Cardiothoracic Vascular Surgery)

## 2019-09-28 ENCOUNTER — Inpatient Hospital Stay (HOSPITAL_COMMUNITY): Payer: Medicare Other

## 2019-09-28 ENCOUNTER — Other Ambulatory Visit: Payer: Self-pay

## 2019-09-28 ENCOUNTER — Encounter (HOSPITAL_COMMUNITY): Payer: Self-pay | Admitting: Thoracic Surgery (Cardiothoracic Vascular Surgery)

## 2019-09-28 ENCOUNTER — Inpatient Hospital Stay (HOSPITAL_COMMUNITY): Payer: Medicare Other | Admitting: Certified Registered Nurse Anesthetist

## 2019-09-28 ENCOUNTER — Encounter (HOSPITAL_COMMUNITY)
Admission: RE | Disposition: A | Discharge status: Home / Self Care | Payer: Self-pay | Source: Home / Self Care | Attending: Thoracic Surgery (Cardiothoracic Vascular Surgery)

## 2019-09-28 DIAGNOSIS — I517 Cardiomegaly: Secondary | ICD-10-CM | POA: Diagnosis not present

## 2019-09-28 DIAGNOSIS — Z7901 Long term (current) use of anticoagulants: Secondary | ICD-10-CM | POA: Diagnosis not present

## 2019-09-28 DIAGNOSIS — I77810 Thoracic aortic ectasia: Secondary | ICD-10-CM | POA: Diagnosis present

## 2019-09-28 DIAGNOSIS — I700 Atherosclerosis of aorta: Secondary | ICD-10-CM | POA: Diagnosis present

## 2019-09-28 DIAGNOSIS — J9811 Atelectasis: Secondary | ICD-10-CM

## 2019-09-28 DIAGNOSIS — N170 Acute kidney failure with tubular necrosis: Secondary | ICD-10-CM | POA: Diagnosis present

## 2019-09-28 DIAGNOSIS — Z9079 Acquired absence of other genital organ(s): Secondary | ICD-10-CM

## 2019-09-28 DIAGNOSIS — J90 Pleural effusion, not elsewhere classified: Secondary | ICD-10-CM | POA: Diagnosis not present

## 2019-09-28 DIAGNOSIS — Z833 Family history of diabetes mellitus: Secondary | ICD-10-CM | POA: Diagnosis not present

## 2019-09-28 DIAGNOSIS — Z803 Family history of malignant neoplasm of breast: Secondary | ICD-10-CM

## 2019-09-28 DIAGNOSIS — Z20822 Contact with and (suspected) exposure to covid-19: Secondary | ICD-10-CM | POA: Diagnosis present

## 2019-09-28 DIAGNOSIS — Z96641 Presence of right artificial hip joint: Secondary | ICD-10-CM | POA: Diagnosis present

## 2019-09-28 DIAGNOSIS — D509 Iron deficiency anemia, unspecified: Secondary | ICD-10-CM | POA: Diagnosis present

## 2019-09-28 DIAGNOSIS — Z8546 Personal history of malignant neoplasm of prostate: Secondary | ICD-10-CM | POA: Diagnosis not present

## 2019-09-28 DIAGNOSIS — I071 Rheumatic tricuspid insufficiency: Secondary | ICD-10-CM | POA: Diagnosis present

## 2019-09-28 DIAGNOSIS — I4821 Permanent atrial fibrillation: Secondary | ICD-10-CM | POA: Diagnosis present

## 2019-09-28 DIAGNOSIS — I2510 Atherosclerotic heart disease of native coronary artery without angina pectoris: Secondary | ICD-10-CM | POA: Diagnosis present

## 2019-09-28 DIAGNOSIS — Z952 Presence of prosthetic heart valve: Secondary | ICD-10-CM | POA: Diagnosis not present

## 2019-09-28 DIAGNOSIS — D62 Acute posthemorrhagic anemia: Secondary | ICD-10-CM | POA: Diagnosis not present

## 2019-09-28 DIAGNOSIS — I083 Combined rheumatic disorders of mitral, aortic and tricuspid valves: Principal | ICD-10-CM | POA: Diagnosis present

## 2019-09-28 DIAGNOSIS — Z79899 Other long term (current) drug therapy: Secondary | ICD-10-CM

## 2019-09-28 DIAGNOSIS — J811 Chronic pulmonary edema: Secondary | ICD-10-CM | POA: Diagnosis not present

## 2019-09-28 DIAGNOSIS — K4090 Unilateral inguinal hernia, without obstruction or gangrene, not specified as recurrent: Secondary | ICD-10-CM | POA: Diagnosis present

## 2019-09-28 DIAGNOSIS — G629 Polyneuropathy, unspecified: Secondary | ICD-10-CM | POA: Diagnosis not present

## 2019-09-28 DIAGNOSIS — R7989 Other specified abnormal findings of blood chemistry: Secondary | ICD-10-CM | POA: Diagnosis not present

## 2019-09-28 DIAGNOSIS — I361 Nonrheumatic tricuspid (valve) insufficiency: Secondary | ICD-10-CM | POA: Diagnosis not present

## 2019-09-28 DIAGNOSIS — Z8249 Family history of ischemic heart disease and other diseases of the circulatory system: Secondary | ICD-10-CM | POA: Diagnosis not present

## 2019-09-28 DIAGNOSIS — J939 Pneumothorax, unspecified: Secondary | ICD-10-CM | POA: Diagnosis not present

## 2019-09-28 DIAGNOSIS — I340 Nonrheumatic mitral (valve) insufficiency: Secondary | ICD-10-CM | POA: Diagnosis present

## 2019-09-28 DIAGNOSIS — K7689 Other specified diseases of liver: Secondary | ICD-10-CM | POA: Diagnosis present

## 2019-09-28 DIAGNOSIS — Z9889 Other specified postprocedural states: Secondary | ICD-10-CM

## 2019-09-28 DIAGNOSIS — C61 Malignant neoplasm of prostate: Secondary | ICD-10-CM | POA: Diagnosis not present

## 2019-09-28 DIAGNOSIS — I352 Nonrheumatic aortic (valve) stenosis with insufficiency: Secondary | ICD-10-CM | POA: Diagnosis not present

## 2019-09-28 DIAGNOSIS — I081 Rheumatic disorders of both mitral and tricuspid valves: Secondary | ICD-10-CM | POA: Diagnosis not present

## 2019-09-28 DIAGNOSIS — Z950 Presence of cardiac pacemaker: Secondary | ICD-10-CM | POA: Diagnosis not present

## 2019-09-28 HISTORY — PX: CLIPPING OF ATRIAL APPENDAGE: SHX5773

## 2019-09-28 HISTORY — PX: TEE WITHOUT CARDIOVERSION: SHX5443

## 2019-09-28 HISTORY — PX: MINIMALLY INVASIVE TRICUSPID VALVE REPAIR: SHX5975

## 2019-09-28 HISTORY — PX: MITRAL VALVE REPAIR: SHX2039

## 2019-09-28 HISTORY — DX: Other specified postprocedural states: Z98.890

## 2019-09-28 LAB — POCT I-STAT 7, (LYTES, BLD GAS, ICA,H+H)
Acid-Base Excess: 1 mmol/L (ref 0.0–2.0)
Acid-Base Excess: 0 mmol/L (ref 0.0–2.0)
Acid-base deficit: 2 mmol/L (ref 0.0–2.0)
Bicarbonate: 25.6 mmol/L (ref 20.0–28.0)
Bicarbonate: 23.5 mmol/L (ref 20.0–28.0)
Bicarbonate: 22.4 mmol/L (ref 20.0–28.0)
Calcium, Ion: 1.04 mmol/L — ABNORMAL LOW (ref 1.15–1.40)
Calcium, Ion: 1.08 mmol/L — ABNORMAL LOW (ref 1.15–1.40)
Calcium, Ion: 1.09 mmol/L — ABNORMAL LOW (ref 1.15–1.40)
HCT: 23 % — ABNORMAL LOW (ref 39.0–52.0)
HCT: 26 % — ABNORMAL LOW (ref 39.0–52.0)
HCT: 25 % — ABNORMAL LOW (ref 39.0–52.0)
Hemoglobin: 7.8 g/dL — ABNORMAL LOW (ref 13.0–17.0)
Hemoglobin: 8.8 g/dL — ABNORMAL LOW (ref 13.0–17.0)
Hemoglobin: 8.5 g/dL — ABNORMAL LOW (ref 13.0–17.0)
O2 Saturation: 100 %
O2 Saturation: 100 %
O2 Saturation: 100 %
Potassium: 4.5 mmol/L (ref 3.5–5.1)
Potassium: 5.1 mmol/L (ref 3.5–5.1)
Potassium: 5.6 mmol/L — ABNORMAL HIGH (ref 3.5–5.1)
Sodium: 140 mmol/L (ref 135–145)
Sodium: 140 mmol/L (ref 135–145)
Sodium: 140 mmol/L (ref 135–145)
TCO2: 27 mmol/L (ref 22–32)
TCO2: 25 mmol/L (ref 22–32)
TCO2: 23 mmol/L (ref 22–32)
pCO2 arterial: 41.7 mmHg (ref 32.0–48.0)
pCO2 arterial: 33.9 mmHg (ref 32.0–48.0)
pCO2 arterial: 34.5 mmHg (ref 32.0–48.0)
pH, Arterial: 7.396 (ref 7.350–7.450)
pH, Arterial: 7.449 (ref 7.350–7.450)
pH, Arterial: 7.421 (ref 7.350–7.450)
pO2, Arterial: 379 mmHg — ABNORMAL HIGH (ref 83.0–108.0)
pO2, Arterial: 287 mmHg — ABNORMAL HIGH (ref 83.0–108.0)
pO2, Arterial: 302 mmHg — ABNORMAL HIGH (ref 83.0–108.0)

## 2019-09-28 LAB — POCT I-STAT, CHEM 8
BUN: 22 mg/dL (ref 8–23)
BUN: 22 mg/dL (ref 8–23)
BUN: 21 mg/dL (ref 8–23)
BUN: 21 mg/dL (ref 8–23)
BUN: 20 mg/dL (ref 8–23)
Calcium, Ion: 1.23 mmol/L (ref 1.15–1.40)
Calcium, Ion: 1.23 mmol/L (ref 1.15–1.40)
Calcium, Ion: 1.09 mmol/L — ABNORMAL LOW (ref 1.15–1.40)
Calcium, Ion: 1.09 mmol/L — ABNORMAL LOW (ref 1.15–1.40)
Calcium, Ion: 1.32 mmol/L (ref 1.15–1.40)
Chloride: 106 mmol/L (ref 98–111)
Chloride: 106 mmol/L (ref 98–111)
Chloride: 105 mmol/L (ref 98–111)
Chloride: 107 mmol/L (ref 98–111)
Chloride: 104 mmol/L (ref 98–111)
Creatinine, Ser: 1.1 mg/dL (ref 0.61–1.24)
Creatinine, Ser: 1 mg/dL (ref 0.61–1.24)
Creatinine, Ser: 1 mg/dL (ref 0.61–1.24)
Creatinine, Ser: 0.9 mg/dL (ref 0.61–1.24)
Creatinine, Ser: 1 mg/dL (ref 0.61–1.24)
Glucose, Bld: 87 mg/dL (ref 70–99)
Glucose, Bld: 101 mg/dL — ABNORMAL HIGH (ref 70–99)
Glucose, Bld: 130 mg/dL — ABNORMAL HIGH (ref 70–99)
Glucose, Bld: 170 mg/dL — ABNORMAL HIGH (ref 70–99)
Glucose, Bld: 151 mg/dL — ABNORMAL HIGH (ref 70–99)
HCT: 29 % — ABNORMAL LOW (ref 39.0–52.0)
HCT: 29 % — ABNORMAL LOW (ref 39.0–52.0)
HCT: 28 % — ABNORMAL LOW (ref 39.0–52.0)
HCT: 25 % — ABNORMAL LOW (ref 39.0–52.0)
HCT: 20 % — ABNORMAL LOW (ref 39.0–52.0)
Hemoglobin: 9.9 g/dL — ABNORMAL LOW (ref 13.0–17.0)
Hemoglobin: 9.9 g/dL — ABNORMAL LOW (ref 13.0–17.0)
Hemoglobin: 9.5 g/dL — ABNORMAL LOW (ref 13.0–17.0)
Hemoglobin: 8.5 g/dL — ABNORMAL LOW (ref 13.0–17.0)
Hemoglobin: 6.8 g/dL — CL (ref 13.0–17.0)
Potassium: 4.2 mmol/L (ref 3.5–5.1)
Potassium: 4.2 mmol/L (ref 3.5–5.1)
Potassium: 4.8 mmol/L (ref 3.5–5.1)
Potassium: 5.4 mmol/L — ABNORMAL HIGH (ref 3.5–5.1)
Potassium: 4.7 mmol/L (ref 3.5–5.1)
Sodium: 143 mmol/L (ref 135–145)
Sodium: 142 mmol/L (ref 135–145)
Sodium: 141 mmol/L (ref 135–145)
Sodium: 140 mmol/L (ref 135–145)
Sodium: 140 mmol/L (ref 135–145)
TCO2: 25 mmol/L (ref 22–32)
TCO2: 25 mmol/L (ref 22–32)
TCO2: 25 mmol/L (ref 22–32)
TCO2: 24 mmol/L (ref 22–32)
TCO2: 21 mmol/L — ABNORMAL LOW (ref 22–32)

## 2019-09-28 LAB — BASIC METABOLIC PANEL
Anion gap: 9 (ref 5–15)
BUN: 20 mg/dL (ref 8–23)
CO2: 18 mmol/L — ABNORMAL LOW (ref 22–32)
Calcium: 7.7 mg/dL — ABNORMAL LOW (ref 8.9–10.3)
Chloride: 109 mmol/L (ref 98–111)
Creatinine, Ser: 1.19 mg/dL (ref 0.61–1.24)
GFR calc Af Amer: >60 mL/min (ref >60)
GFR calc non Af Amer: >60 mL/min (ref >60)
Glucose, Bld: 146 mg/dL — ABNORMAL HIGH (ref 70–99)
Potassium: 5.1 mmol/L (ref 3.5–5.1)
Sodium: 136 mmol/L (ref 135–145)

## 2019-09-28 LAB — CBC
HCT: 28.8 % — ABNORMAL LOW (ref 39.0–52.0)
HCT: 28.2 % — ABNORMAL LOW (ref 39.0–52.0)
Hemoglobin: 9.3 g/dL — ABNORMAL LOW (ref 13.0–17.0)
Hemoglobin: 9.4 g/dL — ABNORMAL LOW (ref 13.0–17.0)
MCH: 34.8 pg — ABNORMAL HIGH (ref 26.0–34.0)
MCH: 35.9 pg — ABNORMAL HIGH (ref 26.0–34.0)
MCHC: 32.3 g/dL (ref 30.0–36.0)
MCHC: 33.3 g/dL (ref 30.0–36.0)
MCV: 107.9 fL — ABNORMAL HIGH (ref 80.0–100.0)
MCV: 107.6 fL — ABNORMAL HIGH (ref 80.0–100.0)
Platelets: 189 10*3/uL (ref 150–400)
Platelets: 188 10*3/uL (ref 150–400)
RBC: 2.67 MIL/uL — ABNORMAL LOW (ref 4.22–5.81)
RBC: 2.62 MIL/uL — ABNORMAL LOW (ref 4.22–5.81)
RDW: 14.9 % (ref 11.5–15.5)
RDW: 15.1 % (ref 11.5–15.5)
WBC: 24 10*3/uL — ABNORMAL HIGH (ref 4.0–10.5)
WBC: 18.9 10*3/uL — ABNORMAL HIGH (ref 4.0–10.5)
nRBC: 0.3 % — ABNORMAL HIGH (ref 0.0–0.2)
nRBC: 0.2 % (ref 0.0–0.2)

## 2019-09-28 LAB — MAGNESIUM: Magnesium: 3.2 mg/dL — ABNORMAL HIGH (ref 1.7–2.4)

## 2019-09-28 LAB — APTT: aPTT: 35 seconds (ref 24–36)

## 2019-09-28 LAB — GLUCOSE, CAPILLARY
Glucose-Capillary: 106 mg/dL — ABNORMAL HIGH (ref 70–99)
Glucose-Capillary: 84 mg/dL (ref 70–99)
Glucose-Capillary: 90 mg/dL (ref 70–99)
Glucose-Capillary: 115 mg/dL — ABNORMAL HIGH (ref 70–99)
Glucose-Capillary: 130 mg/dL — ABNORMAL HIGH (ref 70–99)
Glucose-Capillary: 146 mg/dL — ABNORMAL HIGH (ref 70–99)

## 2019-09-28 LAB — ECHO INTRAOPERATIVE TEE
Height: 75 in
Weight: 2800 oz

## 2019-09-28 LAB — PREPARE RBC (CROSSMATCH)

## 2019-09-28 LAB — HEMOGLOBIN AND HEMATOCRIT, BLOOD
HCT: 24.5 % — ABNORMAL LOW (ref 39.0–52.0)
Hemoglobin: 7.9 g/dL — ABNORMAL LOW (ref 13.0–17.0)

## 2019-09-28 LAB — PLATELET COUNT: Platelets: 195 10*3/uL (ref 150–400)

## 2019-09-28 LAB — PROTIME-INR
INR: 1.8 — ABNORMAL HIGH (ref 0.8–1.2)
Prothrombin Time: 20.6 seconds — ABNORMAL HIGH (ref 11.4–15.2)

## 2019-09-28 SURGERY — REPAIR, MITRAL VALVE, MINIMALLY INVASIVE
Anesthesia: General | Site: Chest | Laterality: Right

## 2019-09-28 MED ORDER — ACETAMINOPHEN 650 MG RE SUPP: 650.0000 mg | Freq: Once | RECTAL | Status: DC

## 2019-09-28 MED ORDER — DEXMEDETOMIDINE HCL IN NACL 400 MCG/100ML IV SOLN: 0.0000 ug/kg/h | INTRAVENOUS | Status: DC

## 2019-09-28 MED ORDER — BUPIVACAINE LIPOSOME 1.3 % IJ SUSP
20.0000 mL | Freq: Once | INTRAMUSCULAR | Status: DC
  Filled 2019-09-28: qty 20

## 2019-09-28 MED ORDER — HEPARIN SODIUM (PORCINE) 1000 UNIT/ML IJ SOLN
INTRAMUSCULAR | Status: AC
  Filled 2019-09-28: qty 1

## 2019-09-28 MED ORDER — ACETAMINOPHEN 500 MG PO TABS
1000.0000 mg | ORAL_TABLET | Freq: Four times a day (QID) | ORAL | Status: AC
  Administered 2019-09-28 – 2019-10-03 (×20): 1000 mg via ORAL
  Filled 2019-09-28 (×20): qty 2

## 2019-09-28 MED ORDER — SODIUM CHLORIDE 0.9 % IV SOLN: INTRAVENOUS | Status: DC | PRN

## 2019-09-28 MED ORDER — CHLORHEXIDINE GLUCONATE 0.12 % MT SOLN: 15.0000 mL | OROMUCOSAL | Status: AC

## 2019-09-28 MED ORDER — DOCUSATE SODIUM 100 MG PO CAPS
200.0000 mg | ORAL_CAPSULE | Freq: Every day | ORAL | Status: DC
  Administered 2019-09-29 – 2019-10-04 (×6): 200 mg via ORAL
  Filled 2019-09-28 (×6): qty 2

## 2019-09-28 MED ORDER — HEPARIN SODIUM (PORCINE) 1000 UNIT/ML IJ SOLN
INTRAMUSCULAR | Status: DC | PRN
  Administered 2019-09-28: 24000 [IU] via INTRAVENOUS

## 2019-09-28 MED ORDER — LACTATED RINGERS IV SOLN: INTRAVENOUS | Status: DC | PRN

## 2019-09-28 MED ORDER — BISACODYL 10 MG RE SUPP: 10.0000 mg | Freq: Every day | RECTAL | Status: DC

## 2019-09-28 MED ORDER — SODIUM CHLORIDE 0.9% IV SOLUTION: Freq: Once | INTRAVENOUS | Status: DC

## 2019-09-28 MED ORDER — VANCOMYCIN HCL 1000 MG IV SOLR
INTRAVENOUS | Status: DC | PRN
  Administered 2019-09-28: 1000 mL

## 2019-09-28 MED ORDER — ROCURONIUM BROMIDE 10 MG/ML (PF) SYRINGE
PREFILLED_SYRINGE | INTRAVENOUS | Status: DC | PRN
  Administered 2019-09-28: 100 mg via INTRAVENOUS
  Administered 2019-09-28 (×2): 10 mg via INTRAVENOUS
  Administered 2019-09-28: 20 mg via INTRAVENOUS

## 2019-09-28 MED ORDER — FAMOTIDINE IN NACL 20-0.9 MG/50ML-% IV SOLN
INTRAVENOUS | Status: AC
  Administered 2019-09-28: 20 mg via INTRAVENOUS
  Filled 2019-09-28: qty 50

## 2019-09-28 MED ORDER — FENTANYL CITRATE (PF) 250 MCG/5ML IJ SOLN
INTRAMUSCULAR | Status: DC | PRN
  Administered 2019-09-28 (×2): 100 ug via INTRAVENOUS
  Administered 2019-09-28: 150 ug via INTRAVENOUS
  Administered 2019-09-28 (×2): 100 ug via INTRAVENOUS
  Administered 2019-09-28: 200 ug via INTRAVENOUS
  Administered 2019-09-28: 50 ug via INTRAVENOUS

## 2019-09-28 MED ORDER — INSULIN ASPART 100 UNIT/ML ~~LOC~~ SOLN
0.0000 [IU] | SUBCUTANEOUS | Status: DC
  Administered 2019-09-28 – 2019-09-29 (×3): 2 [IU] via SUBCUTANEOUS

## 2019-09-28 MED ORDER — PROTAMINE SULFATE 10 MG/ML IV SOLN
INTRAVENOUS | Status: DC | PRN
  Administered 2019-09-28: 210 mg via INTRAVENOUS
  Administered 2019-09-28: 10 mg via INTRAVENOUS

## 2019-09-28 MED ORDER — NITROGLYCERIN IN D5W 200-5 MCG/ML-% IV SOLN: 0.0000 ug/min | INTRAVENOUS | Status: DC

## 2019-09-28 MED ORDER — SODIUM CHLORIDE 0.9% FLUSH
10.0000 mL | Freq: Two times a day (BID) | INTRAVENOUS | Status: DC
  Administered 2019-09-28 – 2019-09-29 (×4): 10 mL

## 2019-09-28 MED ORDER — SODIUM CHLORIDE 0.45 % IV SOLN: INTRAVENOUS | Status: DC | PRN

## 2019-09-28 MED ORDER — EPHEDRINE SULFATE-NACL 50-0.9 MG/10ML-% IV SOSY
PREFILLED_SYRINGE | INTRAVENOUS | Status: DC | PRN
  Administered 2019-09-28: 15 mg via INTRAVENOUS

## 2019-09-28 MED ORDER — MAGNESIUM SULFATE 4 GM/100ML IV SOLN
INTRAVENOUS | Status: AC
  Administered 2019-09-28: 4 g via INTRAVENOUS
  Filled 2019-09-28: qty 100

## 2019-09-28 MED ORDER — ACETAMINOPHEN 160 MG/5ML PO SOLN: 650.0000 mg | Freq: Once | ORAL | Status: DC

## 2019-09-28 MED ORDER — ASPIRIN 81 MG PO CHEW: 324.0000 mg | CHEWABLE_TABLET | Freq: Every day | ORAL | Status: DC

## 2019-09-28 MED ORDER — MAGNESIUM SULFATE 4 GM/100ML IV SOLN: 4.0000 g | Freq: Once | INTRAVENOUS | Status: AC

## 2019-09-28 MED ORDER — OXYCODONE HCL 5 MG PO TABS
5.0000 mg | ORAL_TABLET | ORAL | Status: DC | PRN
  Administered 2019-09-28 – 2019-09-29 (×2): 10 mg via ORAL
  Administered 2019-09-29 – 2019-10-03 (×4): 5 mg via ORAL
  Filled 2019-09-28: qty 1
  Filled 2019-09-28: qty 2
  Filled 2019-09-28 (×2): qty 1
  Filled 2019-09-28: qty 2
  Filled 2019-09-28 (×2): qty 1

## 2019-09-28 MED ORDER — LACTATED RINGERS IV SOLN: INTRAVENOUS | Status: DC

## 2019-09-28 MED ORDER — PHENYLEPHRINE 40 MCG/ML (10ML) SYRINGE FOR IV PUSH (FOR BLOOD PRESSURE SUPPORT)
PREFILLED_SYRINGE | INTRAVENOUS | Status: AC
  Filled 2019-09-28: qty 10

## 2019-09-28 MED ORDER — METOPROLOL TARTRATE 5 MG/5ML IV SOLN: 2.5000 mg | INTRAVENOUS | Status: DC | PRN

## 2019-09-28 MED ORDER — BUPIVACAINE HCL (PF) 0.5 % IJ SOLN
INTRAMUSCULAR | Status: AC
  Filled 2019-09-28: qty 30

## 2019-09-28 MED ORDER — BISACODYL 5 MG PO TBEC
10.0000 mg | DELAYED_RELEASE_TABLET | Freq: Every day | ORAL | Status: DC
  Administered 2019-09-29 – 2019-10-03 (×5): 10 mg via ORAL
  Filled 2019-09-28 (×5): qty 2

## 2019-09-28 MED ORDER — DEXTROSE 50 % IV SOLN: 0.0000 mL | INTRAVENOUS | Status: DC | PRN

## 2019-09-28 MED ORDER — SUGAMMADEX SODIUM 200 MG/2ML IV SOLN
INTRAVENOUS | Status: DC | PRN
  Administered 2019-09-28: 200 mg via INTRAVENOUS

## 2019-09-28 MED ORDER — PHENYLEPHRINE 40 MCG/ML (10ML) SYRINGE FOR IV PUSH (FOR BLOOD PRESSURE SUPPORT)
PREFILLED_SYRINGE | INTRAVENOUS | Status: DC | PRN
  Administered 2019-09-28: 80 ug via INTRAVENOUS
  Administered 2019-09-28: 160 ug via INTRAVENOUS

## 2019-09-28 MED ORDER — DEXAMETHASONE SODIUM PHOSPHATE 10 MG/ML IJ SOLN
INTRAMUSCULAR | Status: DC | PRN
Start: 2019-09-28 — End: 2019-09-28
  Administered 2019-09-28: 10 mg via INTRAVENOUS

## 2019-09-28 MED ORDER — MIDAZOLAM HCL 5 MG/5ML IJ SOLN
INTRAMUSCULAR | Status: DC | PRN
  Administered 2019-09-28 (×2): 1 mg via INTRAVENOUS
  Administered 2019-09-28: 2 mg via INTRAVENOUS
  Administered 2019-09-28: 1 mg via INTRAVENOUS
  Administered 2019-09-28: 2 mg via INTRAVENOUS

## 2019-09-28 MED ORDER — ALBUMIN HUMAN 5 % IV SOLN
250.0000 mL | INTRAVENOUS | Status: DC | PRN
  Administered 2019-09-29: 12.5 g via INTRAVENOUS

## 2019-09-28 MED ORDER — CALCIUM CHLORIDE 10 % IV SOLN
INTRAVENOUS | Status: DC | PRN
  Administered 2019-09-28 (×2): 500 mg via INTRAVENOUS

## 2019-09-28 MED ORDER — PLASMA-LYTE 148 IV SOLN
INTRAVENOUS | Status: DC | PRN
  Administered 2019-09-28: 500 mL

## 2019-09-28 MED ORDER — BUPIVACAINE LIPOSOME 1.3 % IJ SUSP
INTRAMUSCULAR | Status: DC | PRN
  Administered 2019-09-28: 50 mL

## 2019-09-28 MED ORDER — FAMOTIDINE IN NACL 20-0.9 MG/50ML-% IV SOLN: 20.0000 mg | Freq: Two times a day (BID) | INTRAVENOUS | Status: DC

## 2019-09-28 MED ORDER — SODIUM CHLORIDE 0.9 % IV SOLN: 250.0000 mL | INTRAVENOUS | Status: DC

## 2019-09-28 MED ORDER — 0.9 % SODIUM CHLORIDE (POUR BTL) OPTIME
TOPICAL | Status: DC | PRN
  Administered 2019-09-28: 5000 mL

## 2019-09-28 MED ORDER — LACTATED RINGERS IV SOLN: 500.0000 mL | Freq: Once | INTRAVENOUS | Status: DC | PRN

## 2019-09-28 MED ORDER — METOPROLOL TARTRATE 12.5 MG HALF TABLET
12.5000 mg | ORAL_TABLET | Freq: Once | ORAL | Status: DC
  Filled 2019-09-28: qty 1

## 2019-09-28 MED ORDER — CHLORHEXIDINE GLUCONATE 4 % EX LIQD: 30.0000 mL | CUTANEOUS | Status: DC

## 2019-09-28 MED ORDER — SODIUM CHLORIDE 0.9% FLUSH: 10.0000 mL | INTRAVENOUS | Status: DC | PRN

## 2019-09-28 MED ORDER — MIDAZOLAM HCL 2 MG/2ML IJ SOLN: 2.0000 mg | INTRAMUSCULAR | Status: DC | PRN

## 2019-09-28 MED ORDER — ONDANSETRON HCL 4 MG/2ML IJ SOLN
4.0000 mg | Freq: Four times a day (QID) | INTRAMUSCULAR | Status: DC | PRN
  Administered 2019-09-29 – 2019-10-02 (×5): 4 mg via INTRAVENOUS
  Filled 2019-09-28 (×5): qty 2

## 2019-09-28 MED ORDER — POTASSIUM CHLORIDE 10 MEQ/50ML IV SOLN: 10.0000 meq | INTRAVENOUS | Status: AC

## 2019-09-28 MED ORDER — ACETAMINOPHEN 500 MG PO TABS
1000.0000 mg | ORAL_TABLET | Freq: Once | ORAL | Status: AC
  Administered 2019-09-28: 1000 mg via ORAL
  Filled 2019-09-28: qty 2

## 2019-09-28 MED ORDER — ACETAMINOPHEN 160 MG/5ML PO SOLN: 1000.0000 mg | Freq: Four times a day (QID) | ORAL | Status: DC

## 2019-09-28 MED ORDER — VANCOMYCIN HCL IN DEXTROSE 1-5 GM/200ML-% IV SOLN
1000.0000 mg | Freq: Once | INTRAVENOUS | Status: AC
  Administered 2019-09-28: 1000 mg via INTRAVENOUS
  Filled 2019-09-28: qty 200

## 2019-09-28 MED ORDER — INSULIN REGULAR(HUMAN) IN NACL 100-0.9 UT/100ML-% IV SOLN: INTRAVENOUS | Status: DC

## 2019-09-28 MED ORDER — SODIUM CHLORIDE 0.9 % IV SOLN
1.5000 g | Freq: Two times a day (BID) | INTRAVENOUS | Status: AC
  Administered 2019-09-28 – 2019-09-30 (×4): 1.5 g via INTRAVENOUS
  Filled 2019-09-28 (×5): qty 1.5

## 2019-09-28 MED ORDER — SODIUM CHLORIDE 0.9% FLUSH: 3.0000 mL | INTRAVENOUS | Status: DC | PRN

## 2019-09-28 MED ORDER — ONDANSETRON HCL 4 MG/2ML IJ SOLN
INTRAMUSCULAR | Status: DC | PRN
Start: 2019-09-28 — End: 2019-09-28
  Administered 2019-09-28: 4 mg via INTRAVENOUS

## 2019-09-28 MED ORDER — PANTOPRAZOLE SODIUM 40 MG PO TBEC
40.0000 mg | DELAYED_RELEASE_TABLET | Freq: Every day | ORAL | Status: DC
  Administered 2019-09-30 – 2019-10-04 (×5): 40 mg via ORAL
  Filled 2019-09-28 (×5): qty 1

## 2019-09-28 MED ORDER — PROTAMINE SULFATE 10 MG/ML IV SOLN
INTRAVENOUS | Status: AC
  Filled 2019-09-28: qty 25

## 2019-09-28 MED ORDER — SODIUM CHLORIDE 0.9 % IV SOLN: INTRAVENOUS | Status: DC

## 2019-09-28 MED ORDER — ASPIRIN EC 325 MG PO TBEC: 325.0000 mg | DELAYED_RELEASE_TABLET | Freq: Every day | ORAL | Status: DC

## 2019-09-28 MED ORDER — PROPOFOL 10 MG/ML IV BOLUS
INTRAVENOUS | Status: DC | PRN
  Administered 2019-09-28: 100 mg via INTRAVENOUS

## 2019-09-28 MED ORDER — PHENYLEPHRINE HCL-NACL 20-0.9 MG/250ML-% IV SOLN
0.0000 ug/min | INTRAVENOUS | Status: DC
  Administered 2019-09-29: 20 ug/min via INTRAVENOUS
  Filled 2019-09-28 (×2): qty 250

## 2019-09-28 MED ORDER — ALBUMIN HUMAN 5 % IV SOLN
INTRAVENOUS | Status: DC | PRN
Start: 2019-09-28 — End: 2019-09-28

## 2019-09-28 MED ORDER — FENTANYL CITRATE (PF) 250 MCG/5ML IJ SOLN
INTRAMUSCULAR | Status: AC
  Filled 2019-09-28: qty 25

## 2019-09-28 MED ORDER — SODIUM CHLORIDE 0.9% FLUSH
3.0000 mL | Freq: Two times a day (BID) | INTRAVENOUS | Status: DC
  Administered 2019-09-29 – 2019-10-03 (×9): 3 mL via INTRAVENOUS

## 2019-09-28 MED ORDER — EPHEDRINE 5 MG/ML INJ
INTRAVENOUS | Status: AC
  Filled 2019-09-28: qty 10

## 2019-09-28 MED ORDER — ORAL CARE MOUTH RINSE
15.0000 mL | Freq: Two times a day (BID) | OROMUCOSAL | Status: DC
  Administered 2019-09-28 – 2019-10-04 (×11): 15 mL via OROMUCOSAL

## 2019-09-28 MED ORDER — ORAL CARE MOUTH RINSE: 15.0000 mL | Freq: Two times a day (BID) | OROMUCOSAL | Status: DC

## 2019-09-28 MED ORDER — CHLORHEXIDINE GLUCONATE 0.12 % MT SOLN
15.0000 mL | Freq: Once | OROMUCOSAL | Status: AC
  Administered 2019-09-28: 15 mL via OROMUCOSAL
  Filled 2019-09-28: qty 15

## 2019-09-28 MED ORDER — MORPHINE SULFATE (PF) 2 MG/ML IV SOLN
1.0000 mg | INTRAVENOUS | Status: DC | PRN
  Administered 2019-09-28 – 2019-09-29 (×3): 2 mg via INTRAVENOUS
  Filled 2019-09-28 (×3): qty 1

## 2019-09-28 MED ORDER — TRAMADOL HCL 50 MG PO TABS
50.0000 mg | ORAL_TABLET | ORAL | Status: DC | PRN
  Administered 2019-09-28: 100 mg via ORAL
  Administered 2019-09-29: 50 mg via ORAL
  Administered 2019-09-29: 100 mg via ORAL
  Administered 2019-09-30: 50 mg via ORAL
  Administered 2019-09-30 – 2019-10-04 (×16): 100 mg via ORAL
  Filled 2019-09-28: qty 1
  Filled 2019-09-28 (×9): qty 2
  Filled 2019-09-28: qty 1
  Filled 2019-09-28 (×9): qty 2

## 2019-09-28 MED ORDER — CHLORHEXIDINE GLUCONATE CLOTH 2 % EX PADS
6.0000 | MEDICATED_PAD | Freq: Every day | CUTANEOUS | Status: DC
  Administered 2019-09-28 – 2019-09-30 (×2): 6 via TOPICAL

## 2019-09-28 MED ORDER — PROPOFOL 10 MG/ML IV BOLUS
INTRAVENOUS | Status: AC
  Filled 2019-09-28: qty 20

## 2019-09-28 MED ORDER — CHLORHEXIDINE GLUCONATE 0.12 % MT SOLN
OROMUCOSAL | Status: AC
  Administered 2019-09-28: 15 mL via OROMUCOSAL
  Filled 2019-09-28: qty 15

## 2019-09-28 MED ORDER — SUCCINYLCHOLINE CHLORIDE 200 MG/10ML IV SOSY
PREFILLED_SYRINGE | INTRAVENOUS | Status: AC
  Filled 2019-09-28: qty 10

## 2019-09-28 MED ORDER — MIDAZOLAM HCL (PF) 10 MG/2ML IJ SOLN
INTRAMUSCULAR | Status: AC
  Filled 2019-09-28: qty 2

## 2019-09-28 SURGICAL SUPPLY — 113 items
ADAPTER CARDIO PERF ANTE/RETRO (ADAPTER) ×4 IMPLANT
ARTICLIP LAA PROCLIP II 45 (Clip) ×4 IMPLANT
BAG DECANTER FOR FLEXI CONT (MISCELLANEOUS) ×8 IMPLANT
BLADE CLIPPER SURG (BLADE) ×4 IMPLANT
BLADE SURG 11 STRL SS (BLADE) ×4 IMPLANT
BLOOD HAEMOCONCENTR 700 MIDI (MISCELLANEOUS) ×4 IMPLANT
CANISTER SUCT 3000ML PPV (MISCELLANEOUS) ×8 IMPLANT
CANNULA ADULT BIO-MEDICUS 15FR (CANNULA) ×4 IMPLANT
CANNULA FEM VENOUS REMOTE 22FR (CANNULA) ×4 IMPLANT
CANNULA FEMORAL ART 14 SM (MISCELLANEOUS) ×4 IMPLANT
CANNULA GUNDRY RCSP 15FR (MISCELLANEOUS) ×4 IMPLANT
CANNULA OPTISITE PERFUSION 16F (CANNULA) IMPLANT
CANNULA OPTISITE PERFUSION 18F (CANNULA) ×4 IMPLANT
CANNULA SUMP PERICARDIAL (CANNULA) ×8 IMPLANT
CATH CPB KIT OWEN (MISCELLANEOUS) IMPLANT
CATH KIT ON-Q SILVERSOAK 5IN (CATHETERS) IMPLANT
CATH ROBINSON RED A/P 18FR (CATHETERS) ×4 IMPLANT
CELLS DAT CNTRL 66122 CELL SVR (MISCELLANEOUS) ×3 IMPLANT
CLIP VESOCCLUDE SM WIDE 24/CT (CLIP) ×4 IMPLANT
CNTNR URN SCR LID CUP LEK RST (MISCELLANEOUS) ×3 IMPLANT
CONN ST 1/4X3/8  BEN (MISCELLANEOUS) ×3
CONN ST 1/4X3/8 BEN (MISCELLANEOUS) ×9 IMPLANT
CONNECTOR 1/2X3/8X1/2 3 WAY (MISCELLANEOUS) ×4
CONNECTOR 1/2X3/8X1/2 3WAY (MISCELLANEOUS) ×6 IMPLANT
CONT SPEC 4OZ STRL OR WHT (MISCELLANEOUS) ×1
COVER BACK TABLE 24X17X13 BIG (DRAPES) ×4 IMPLANT
COVER PROBE W GEL 5X96 (DRAPES) ×4 IMPLANT
DERMABOND ADVANCED (GAUZE/BANDAGES/DRESSINGS) ×1
DERMABOND ADVANCED .7 DNX12 (GAUZE/BANDAGES/DRESSINGS) ×3 IMPLANT
DEVICE ATRICLIP LAA PRCLPII 45 (Clip) ×3 IMPLANT
DEVICE CLOSURE PERCLS PRGLD 6F (VASCULAR PRODUCTS) ×12 IMPLANT
DEVICE SUT CK QUICK LOAD MINI (Prosthesis & Implant Heart) ×8 IMPLANT
DEVICE TROCAR PUNCTURE CLOSURE (ENDOMECHANICALS) ×4 IMPLANT
DRAIN CHANNEL 32F RND 10.7 FF (WOUND CARE) ×8 IMPLANT
DRAPE C-ARM 42X72 X-RAY (DRAPES) ×4 IMPLANT
DRAPE CV SPLIT W-CLR ANES SCRN (DRAPES) ×8 IMPLANT
DRAPE INCISE IOBAN 66X45 STRL (DRAPES) ×4 IMPLANT
DRAPE PERI GROIN 82X75IN TIB (DRAPES) ×4 IMPLANT
DRAPE SLUSH/WARMER DISC (DRAPES) ×4 IMPLANT
DRSG AQUACEL AG ADV 3.5X10 (GAUZE/BANDAGES/DRESSINGS) ×4 IMPLANT
ELECT BLADE 6.5 EXT (BLADE) ×4 IMPLANT
ELECT REM PT RETURN 9FT ADLT (ELECTROSURGICAL) ×8
ELECTRODE REM PT RTRN 9FT ADLT (ELECTROSURGICAL) ×6 IMPLANT
FELT TEFLON 1X6 (MISCELLANEOUS) ×4 IMPLANT
FEMORAL VENOUS CANN RAP (CANNULA) IMPLANT
GAUZE SPONGE 4X4 12PLY STRL LF (GAUZE/BANDAGES/DRESSINGS) ×4 IMPLANT
GLOVE BIO SURGEON STRL SZ 6.5 (GLOVE) ×12 IMPLANT
GLOVE BIOGEL PI IND STRL 7.5 (GLOVE) ×3 IMPLANT
GLOVE BIOGEL PI INDICATOR 7.5 (GLOVE) ×1
GLOVE ORTHO TXT STRL SZ7.5 (GLOVE) ×12 IMPLANT
GLOVE SURG SS PI 7.0 STRL IVOR (GLOVE) ×4 IMPLANT
GOWN STRL REUS W/ TWL LRG LVL3 (GOWN DISPOSABLE) ×30 IMPLANT
GOWN STRL REUS W/TWL LRG LVL3 (GOWN DISPOSABLE) ×10
GRASPER SUT TROCAR 14GX15 (MISCELLANEOUS) ×4 IMPLANT
IV NS 1000ML (IV SOLUTION) ×1
IV NS 1000ML BAXH (IV SOLUTION) ×3 IMPLANT
IV NS IRRIG 3000ML ARTHROMATIC (IV SOLUTION) ×4 IMPLANT
KIT BASIN OR (CUSTOM PROCEDURE TRAY) ×4 IMPLANT
KIT DEVICE SUT COR-KNOT MIS 5 (INSTRUMENTS) ×4 IMPLANT
KIT DILATOR VASC 18G NDL (KITS) ×4 IMPLANT
KIT DRAINAGE VACCUM ASSIST (KITS) ×4 IMPLANT
KIT SUCTION CATH 14FR (SUCTIONS) ×4 IMPLANT
KIT SUT CK MINI COMBO 4X17 (Prosthesis & Implant Heart) ×4 IMPLANT
KIT TURNOVER KIT B (KITS) ×4 IMPLANT
LEAD PACING MYOCARDI (MISCELLANEOUS) ×4 IMPLANT
LINE VENT (MISCELLANEOUS) ×4 IMPLANT
NEEDLE AORTIC ROOT 14G 7F (CATHETERS) ×4 IMPLANT
NS IRRIG 1000ML POUR BTL (IV SOLUTION) ×20 IMPLANT
PACK E MIN INVASIVE VALVE (SUTURE) ×4 IMPLANT
PACK OPEN HEART (CUSTOM PROCEDURE TRAY) ×4 IMPLANT
PAD ARMBOARD 7.5X6 YLW CONV (MISCELLANEOUS) ×8 IMPLANT
PAD ELECT DEFIB RADIOL ZOLL (MISCELLANEOUS) ×4 IMPLANT
PERCLOSE PROGLIDE 6F (VASCULAR PRODUCTS) ×16
POSITIONER HEAD DONUT 9IN (MISCELLANEOUS) ×4 IMPLANT
RING ANLPLS SIMUFORM 36 (Prosthesis & Implant Heart) ×3 IMPLANT
RING ANNULOPLASTY SIMUFORM 36 (Prosthesis & Implant Heart) ×2 IMPLANT
RTRCTR WOUND ALEXIS 18CM MED (MISCELLANEOUS) ×4
SET CANNULATION TOURNIQUET (MISCELLANEOUS) ×4 IMPLANT
SET CARDIOPLEGIA MPS 5001102 (MISCELLANEOUS) ×4 IMPLANT
SET IRRIG TUBING LAPAROSCOPIC (IRRIGATION / IRRIGATOR) ×4 IMPLANT
SET MICROPUNCTURE 5F STIFF (MISCELLANEOUS) ×4 IMPLANT
SHEATH PINNACLE 8F 10CM (SHEATH) ×12 IMPLANT
SIZER CHORD-X CHORDAL CXCS (SIZER) ×4 IMPLANT
SOL ANTI FOG 6CC (MISCELLANEOUS) ×3 IMPLANT
SOLUTION ANTI FOG 6CC (MISCELLANEOUS) ×1
SUT BONE WAX W31G (SUTURE) ×4 IMPLANT
SUT ETHIBOND (SUTURE) ×8 IMPLANT
SUT ETHIBOND 2 0 SH (SUTURE) ×4 IMPLANT
SUT ETHIBOND 2-0 RB-1 WHT (SUTURE) ×8 IMPLANT
SUT ETHIBOND X763 2 0 SH 1 (SUTURE) ×4 IMPLANT
SUT GORETEX CV 4 TH 22 36 (SUTURE) IMPLANT
SUT PROLENE 3 0 SH DA (SUTURE) ×8 IMPLANT
SUT PROLENE 3 0 SH1 36 (SUTURE) ×12 IMPLANT
SUT PROLENE 4 0 RB 1 (SUTURE) ×6
SUT PROLENE 4-0 RB1 .5 CRCL 36 (SUTURE) ×6 IMPLANT
SUT PTFE CHORD X 16MM (SUTURE) ×8 IMPLANT
SUT SILK  1 MH (SUTURE) ×1
SUT SILK 1 MH (SUTURE) ×3 IMPLANT
SUT VIC AB 3-0 SH 8-18 (SUTURE) ×4 IMPLANT
SYR TOOMEY 50ML (SYRINGE) ×4 IMPLANT
SYSTEM SAHARA CHEST DRAIN ATS (WOUND CARE) ×4 IMPLANT
TAPE CLOTH SURG 4X10 WHT LF (GAUZE/BANDAGES/DRESSINGS) ×4 IMPLANT
TAPE PAPER 2X10 WHT MICROPORE (GAUZE/BANDAGES/DRESSINGS) ×4 IMPLANT
TOWEL GREEN STERILE (TOWEL DISPOSABLE) ×4 IMPLANT
TOWEL GREEN STERILE FF (TOWEL DISPOSABLE) ×4 IMPLANT
TRAY FOLEY SLVR 16FR TEMP STAT (SET/KITS/TRAYS/PACK) ×4 IMPLANT
TROCAR XCEL BLADELESS 5X75MML (TROCAR) ×4 IMPLANT
TROCAR XCEL NON-BLD 11X100MML (ENDOMECHANICALS) ×8 IMPLANT
TUBE SUCT INTRACARD DLP 20F (MISCELLANEOUS) ×4 IMPLANT
TUNNELER SHEATH ON-Q 11GX8 DSP (PAIN MANAGEMENT) IMPLANT
UNDERPAD 30X36 HEAVY ABSORB (UNDERPADS AND DIAPERS) ×4 IMPLANT
WATER STERILE IRR 1000ML POUR (IV SOLUTION) ×8 IMPLANT
WIRE EMERALD 3MM-J .035X150CM (WIRE) ×4 IMPLANT

## 2019-09-28 NOTE — Progress Notes (Signed)
  Echocardiogram Echocardiogram Transesophageal has been performed.  Johny Chess 09/28/2019, 9:41 AM

## 2019-09-28 NOTE — Anesthesia Procedure Notes (Signed)
Central Venous Catheter Insertion Performed by: Roderic Palau, MD, anesthesiologist Start/End8/05/2019 7:45 AM, 09/28/2019 8:00 AM Patient location: Pre-op. Preanesthetic checklist: patient identified, IV checked, site marked, risks and benefits discussed, surgical consent, monitors and equipment checked, pre-op evaluation, timeout performed and anesthesia consent Position: Trendelenburg Lidocaine 1% used for infiltration and patient sedated Hand hygiene performed , maximum sterile barriers used  and Seldinger technique used Catheter size: 9 Fr Total catheter length 10. Central line was placed.MAC introducer Procedure performed using ultrasound guided technique. Ultrasound Notes:anatomy identified, needle tip was noted to be adjacent to the nerve/plexus identified, no ultrasound evidence of intravascular and/or intraneural injection and image(s) printed for medical record Attempts: 1 Following insertion, line sutured, dressing applied and Biopatch. Post procedure assessment: blood return through all ports, free fluid flow and no air  Patient tolerated the procedure well with no immediate complications.

## 2019-09-28 NOTE — Interval H&P Note (Signed)
History and Physical Interval Note:  09/28/2019 8:33 AM  Raymond Ray  has presented today for surgery, with the diagnosis of MR AFIB.  The various methods of treatment have been discussed with the patient and family. After consideration of risks, benefits and other options for treatment, the patient has consented to  Procedure(s): MINIMALLY INVASIVE MITRAL VALVE REPAIR (MVR) (Right) possible MINIMALLY INVASIVE TRICUSPID VALVE REPAIR (Right) TRANSESOPHAGEAL ECHOCARDIOGRAM (TEE) (N/A) as a surgical intervention.  The patient's history has been reviewed, patient examined, no change in status, stable for surgery.  I have reviewed the patient's chart and labs.  Questions were answered to the patient's satisfaction.     Rexene Alberts

## 2019-09-28 NOTE — Anesthesia Procedure Notes (Signed)
Procedure Name: Intubation Date/Time: 09/28/2019 8:54 AM Performed by: Janace Litten, CRNA Pre-anesthesia Checklist: Patient identified, Emergency Drugs available, Suction available and Patient being monitored Patient Re-evaluated:Patient Re-evaluated prior to induction Oxygen Delivery Method: Circle System Utilized Preoxygenation: Pre-oxygenation with 100% oxygen Induction Type: IV induction Ventilation: Mask ventilation without difficulty Laryngoscope Size: Mac and 4 Grade View: Grade I Tube type: Oral Endobronchial tube: Left, EBT position confirmed by auscultation, Double lumen EBT and EBT position confirmed by fiberoptic bronchoscope and 39 Fr Number of attempts: 1 Airway Equipment and Method: Stylet and Oral airway Placement Confirmation: ETT inserted through vocal cords under direct vision,  positive ETCO2 and breath sounds checked- equal and bilateral Tube secured with: Tape Dental Injury: Teeth and Oropharynx as per pre-operative assessment

## 2019-09-28 NOTE — Transfer of Care (Signed)
Immediate Anesthesia Transfer of Care Note  Patient: Raymond Ray  Procedure(s) Performed: MINIMALLY INVASIVE MITRAL VALVE REPAIR (MVR) USING SIMUFORM 36MM (Right Chest) possible MINIMALLY INVASIVE TRICUSPID VALVE REPAIR (Right Chest) TRANSESOPHAGEAL ECHOCARDIOGRAM (TEE) (N/A ) CLIPPING OF ATRIAL APPENDAGE USING ATRICURE CLIP SIZE 45MM (N/A Chest)  Patient Location: ICU  Anesthesia Type:General  Level of Consciousness: awake, alert  and patient cooperative  Airway & Oxygen Therapy: Patient Spontanous Breathing and Patient connected to face mask oxygen  Post-op Assessment: Report given to RN and Post -op Vital signs reviewed and stable  Post vital signs: Reviewed and stable  Last Vitals:  Vitals Value Taken Time  BP 120/79 09/28/19 1424  Temp    Pulse 80 09/28/19 1432  Resp 17 09/28/19 1432  SpO2 98 % 09/28/19 1432  Vitals shown include unvalidated device data.  Last Pain:  Vitals:   09/28/19 0715  TempSrc:   PainSc: 0-No pain         Complications: No complications documented.

## 2019-09-28 NOTE — Progress Notes (Signed)
Patient ID: Raymond Ray, male   DOB: 06-06-1950, 69 y.o.   MRN: [id number]  TCTS Evening Rounds:   Hemodynamically stable on neo. CI = 2.0  Extubated  Urine output good  CT output low  CBC    Component Value Date/Time   WBC 24.0 (H) 09/28/2019 1502   RBC 2.67 (L) 09/28/2019 1502   HGB 9.3 (L) 09/28/2019 1502   HGB 12.1 (L) 08/11/2019 0959   HCT 28.8 (L) 09/28/2019 1502   HCT 36.1 (L) 08/11/2019 0959   PLT 189 09/28/2019 1502   PLT 243 08/11/2019 0959   MCV 107.9 (H) 09/28/2019 1502   MCV 104 (H) 08/11/2019 0959   MCH 34.8 (H) 09/28/2019 1502   MCHC 32.3 09/28/2019 1502   RDW 14.9 09/28/2019 1502   RDW 14.2 08/11/2019 0959   LYMPHSABS 1.1 08/11/2019 0959   MONOABS 0.2 01/07/2017 1315   EOSABS 0.1 08/11/2019 0959   BASOSABS 0.1 08/11/2019 0959     BMET    Component Value Date/Time   NA 140 09/28/2019 1324   NA 144 08/11/2019 0959   K 4.7 09/28/2019 1324   CL 104 09/28/2019 1324   CO2 23 09/26/2019 1140   GLUCOSE 151 (H) 09/28/2019 1324   BUN 20 09/28/2019 1324   BUN 26 08/11/2019 0959   CREATININE 1.00 09/28/2019 1324   CALCIUM 9.0 09/26/2019 1140   GFRNONAA >60 09/26/2019 1140   GFRAA >60 09/26/2019 1140     A/P:  Stable postop course. Continue current plans

## 2019-09-28 NOTE — Anesthesia Procedure Notes (Signed)
Arterial Line Insertion Start/End8/05/2019 7:45 AM Performed by: Lance Coon, CRNA  Preanesthetic checklist: patient identified, IV checked, risks and benefits discussed, surgical consent, monitors and equipment checked and pre-op evaluation Lidocaine 1% used for infiltration and patient sedated Left, radial was placed Catheter size: 20 G Hand hygiene performed  and maximum sterile barriers used  Allen's test indicative of satisfactory collateral circulation Attempts: 1 Procedure performed without using ultrasound guided technique. Following insertion, dressing applied and Biopatch. Post procedure assessment: normal  Patient tolerated the procedure well with no immediate complications.

## 2019-09-28 NOTE — Anesthesia Postprocedure Evaluation (Signed)
Anesthesia Post Note  Patient: Raymond Ray  Procedure(s) Performed: MINIMALLY INVASIVE MITRAL VALVE REPAIR (MVR) USING SIMUFORM 36MM (Right Chest) possible MINIMALLY INVASIVE TRICUSPID VALVE REPAIR (Right Chest) TRANSESOPHAGEAL ECHOCARDIOGRAM (TEE) (N/A ) CLIPPING OF ATRIAL APPENDAGE USING ATRICURE CLIP SIZE 45MM (N/A Chest)     Patient location during evaluation: ICU Anesthesia Type: General Level of consciousness: sedated Pain management: pain level controlled Vital Signs Assessment: post-procedure vital signs reviewed and stable Respiratory status: spontaneous breathing, nonlabored ventilation, respiratory function stable and patient connected to face mask oxygen Cardiovascular status: blood pressure returned to baseline and stable Postop Assessment: no apparent nausea or vomiting Anesthetic complications: no   No complications documented.  Last Vitals:  Vitals:   09/28/19 1500 09/28/19 1515  BP:    Pulse: 80 81  Resp: (!) 21 18  Temp: (!) 36 C (!) 36.1 C  SpO2: 98% 99%    Last Pain:  Vitals:   09/28/19 0715  TempSrc:   PainSc: 0-No pain                 Sharea Guinther,W. EDMOND

## 2019-09-28 NOTE — Anesthesia Procedure Notes (Signed)
Central Venous Catheter Insertion Performed by: Roderic Palau, MD, anesthesiologist Start/End8/05/2019 7:45 AM, 09/28/2019 8:00 AM Patient location: Pre-op. Preanesthetic checklist: patient identified, IV checked, site marked, risks and benefits discussed, surgical consent, monitors and equipment checked, pre-op evaluation, timeout performed and anesthesia consent Hand hygiene performed  and maximum sterile barriers used  PA cath was placed.Swan type:thermodilution PA Cath depth:50 Procedure performed without using ultrasound guided technique. Attempts: 1 Patient tolerated the procedure well with no immediate complications.

## 2019-09-28 NOTE — Brief Op Note (Addendum)
09/28/2019  1:54 PM  PATIENT:  Raymond Ray  69 y.o. male  PRE-OPERATIVE DIAGNOSIS:  1. Severe mitral Regurgitation 2. Atrial Fibrillation  POST-OPERATIVE DIAGNOSIS:  1. Severe mitral Regurgitation 2. Atrial Fibrillation  PROCEDURE: TRANSESOPHAGEAL ECHOCARDIOGRAM (TEE), MINIMALLY INVASIVE MITRAL VALVE REPAIR (MVR) (USING SIMUFORM Model # [id number], Serial # [id number], Size 36MM), with COMPLEX VALVULOPLASTY (3 pairs of Neo Cords) CLIPPING OF ATRIAL APPENDAGE USING ATRICURE CLIP ( SIZE 45 MM)   SURGEON:  Surgeon(s) and Role:    Rexene Alberts, MD - Primary  PHYSICIAN ASSISTANT: Lars Pinks PA-C  ANESTHESIA:   general  EBL:  Per anesthesia and perfusion record  DRAINS: Chest tubes placed in the mediastinal and pleural spaces   COUNTS CORRECT:  YES  DICTATION: .Dragon Dictation  PLAN OF CARE: Admit to inpatient   PATIENT DISPOSITION:  ICU - intubated and hemodynamically stable.   Delay start of Pharmacological VTE agent (>24hrs) due to surgical blood loss or risk of bleeding: yes  BASELINE WEIGHT: 79.4 kg

## 2019-09-28 NOTE — Op Note (Addendum)
CARDIOTHORACIC SURGERY OPERATIVE NOTE  Date of Procedure:  09/28/2019  Preoperative Diagnosis: Severe Mitral Regurgitation  Postoperative Diagnosis: Same  Procedure:    Minimally-Invasive Mitral Valve Repair  Complex valvuloplasty including artificial Gore-tex neochord placement x6  Medtronic Sinuform Ring Annuloplasty (size 35mm, model # [id number], serial # [id number])  Clipping of left atrial appendage (Atricure Pro245 left atrial clip, size 45 mm)    Surgeon: Valentina Gu. 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  Patient has long history of mitral valve prolapse and atrial fibrillation first diagnosed nearly 40 years ago. He initially underwent cardioversion and was treated with quinidine and maintained sinus rhythm for more than 10 years. He later developed recurrent persistent atrial fibrillation for which she has been treated with long-term anticoagulation and rate control. He was followed for many years by Dr. Mare Ferrari and more recently he has been followed by Dr. Oval Linsey. He states that his only been during the last several years that he has known of presence of a heart murmur. Echocardiogram performed in 2011 reportedly revealed holosystolic mitral valve  prolapse with moderate mitral regurgitation. Follow-up echocardiogram January 2020 revealed mitral valve prolapse with moderate mitral regurgitation and normal left ventricular systolic function with ejection fraction estimated 60 to 65%.   Patient recently developed prostate cancer for which he underwent robotic prostatectomy in April 2021. He recovered uneventfully and did not have problems with congestive heart failure during his brief hospitalization. He does complain of exertional shortness of breath with more strenuous exertion, such as walking up a hill or walking on an elliptical trainer at a brisk pace. He also reports that he gets short of breath walking up a flight of stairs.  Recent follow-up transthoracic echocardiogram revealed mitral valve prolapse with "moderate to severe mitral regurgitation"with drop in left ventricular systolic function and ejection fraction estimated only 50 to 55%. Transesophageal echocardiogram was performed August 24, 2019 and confirmed the presence of bileaflet prolapse with severe mitral regurgitation and mildly reduced left ventricular systolic function. There was no definite flail segment in either leaflet but there was severe mitral regurgitation with systolic flow reversal in the pulmonary veins. Ejection fraction was estimated 50 to 55%. There was moderate right ventricular systolic dysfunction with mild right ventricular chamber enlargement and moderate tricuspid regurgitation. Patient was referred to the multidisciplinary heart valve clinic and has been evaluated previously by Dr. Burt Knack. Diagnostic cardiac catheterization was performed and revealed nonobstructive calcified plaque in the left anterior descending coronary artery with otherwise minimal nonobstructive coronary artery disease. Right heart pressures were normal although there was a large V wave and pulmonary capillary wedge tracing consistent with severe mitral regurgitation.  Cardiothoracic surgical consultation was requested.  The patient has been seen in consultation and counseled at length regarding the indications, risks and potential benefits of surgery.  All questions have been answered,  and the patient provides full informed consent for the operation as described.    DETAILS OF THE OPERATIVE PROCEDURE  Preparation:  The patient is brought to the operating room on the above mentioned date and central monitoring was established by the anesthesia team including placement of Swan-Ganz catheter through the left internal jugular vein.  A radial arterial line is placed. The patient is placed in the supine position on the operating table.  Intravenous antibiotics are administered. General endotracheal anesthesia is induced uneventfully. The patient is initially intubated using a dual lumen endotracheal tube.  A Foley catheter is placed.  Baseline transesophageal echocardiogram was performed.  Findings were notable for normal left ventricular systolic function with severe mitral regurgitation.  There was severe billowing and prolapse involving primarily the posterior leaflet of the mitral valve.  There did not appear to be any ruptured primary chordae tendinae.  The jet of regurgitation was central and crossed the left atrium.  The aortic valve appeared normal.  Left ventricle was somewhat dilated but function was normal.  Right ventricular size and function was normal.  There was severe left and right atrial enlargement.  The tricuspid annulus was mildly dilated at 4.0 cm.  There was trace to mild tricuspid regurgitation.  There was billowing of some of the tricuspid leaflet tissue but no severe prolapse.  A soft roll is placed behind the patient's left scapula and the neck gently extended and turned to the left.   The patient's right neck, chest, abdomen, both groins, and both lower extremities are prepared and draped in a sterile manner. A time out procedure is  performed.   Percutaneous Vascular Access:  Percutaneous arterial and venous access were obtained on the right side.  Using ultrasound guidance the right common femoral vein was cannulated using the Seldinger technique a pair of Perclose vascular closure devises were placed at opposing 30 degree angles in the femoral vein, after which time an 8 French sheath inserted.  The right common femoral artery was cannulated using a micropuncture wire and sheath.  A pair of Perclose vascular closure devices were placed at opposing 30 degree angles in the femoral artery, and a 8 French sheath inserted.  The right internal jugular vein was cannulated  using ultrasound guidance and an 8 French sheath inserted.     Surgical Approach:  A right miniature anterolateral thoracotomy incision is performed. The incision is placed just lateral to and superior to the right nipple. The pectoralis major muscle is retracted medially and completely preserved. The right pleural space is entered through the 3rd intercostal space. A soft tissue retractor is placed.  Two 11 mm ports are placed through separate stab incisions inferiorly. The right pleural space is insufflated continuously with carbon dioxide gas through the posterior port during the remainder of the operation.  A pledgeted sutures placed through the dome of the right hemidiaphragm and retracted inferiorly to facilitate exposure.  A longitudinal incision is made in the pericardium 3 cm anterior to the phrenic nerve and silk traction sutures are placed on either side of the incision for exposure.   Extracorporeal Cardiopulmonary Bypass:  The patient was heparinized systemically.  The right common femoral vein is cannulated through the venous sheath and a guidewire advanced into the right atrium using TEE guidance.  The femoral vein cannulated using a 22 Fr long femoral venous cannula.  The right common femoral artery is cannulated through the arterial sheath and a  guidewire advanced into the descending thoracic aorta using  TEE guidance.  Femoral artery is cannulated with a 18 French femoral arterial cannula.  The right internal jugular vein is cannulated through the venous sheath and a guidewire advanced into the right atrium.  The internal jugular vein is cannulated using a 15 [place] pediatric femoral venous cannula.   Adequate heparinization is verified.   The entire pre-bypass portion of the operation was notable for stable hemodynamics.  Cardiopulmonary bypass was begun.  Vacuum assist venous drainage is utilized. The incision in the pericardium is extended in both directions. Venous drainage and exposure are notably excellent.    Clipping of Left Atrial Appendage:   The left atrial appendage was obliterated using direct application of a left atrial clip (AtriCure Pro 245, size 45 mm).  The clip was applied under direct vision through the transverse sinus posterior to the aorta.  After the clip was applied complete obliteration of the appendage was verified using transesophageal echocardiography.   Myocardial Protection:   A retrograde cardioplegia cannula is placed through the right atrium into the coronary sinus using transesophageal echocardiogram guidance.  An antegrade cardioplegia cannula is placed in the ascending aorta.    The patient is cooled to 32C systemic temperature.  The aortic cross clamp is applied and cardioplegia is delivered initially in an antegrade fashion through the aortic root using modified [place] cold blood cardioplegia ([place] blood cardioplegia protocol).   The initial cardioplegic arrest is rapid with early diastolic arrest.  Repeat doses of cardioplegia are administered at 90 minutes and every 30 minutes thereafter through the coronary sinus catheter in order to maintain completely flat electrocardiogram.  Myocardial protection was felt to be excellent.   Mitral Valve Repair:  A left atriotomy incision was  performed through the interatrial groove and extended partially across the back wall of the left atrium after opening the oblique sinus inferiorly.  The mitral valve is exposed using a self-retaining retractor.  The mitral valve was inspected and notable for forme fruste variant of Barlow's type myxomatous degenerative disease.  Billowing prolapse was most pronounced in the P1 and P2 segments of the posterior leaflet but involve the P3 segment as well.  There were elongated primary chordae tendinae but there were no ruptured primary chordae tendinae.  The anterior leaflet was essentially normal.  The annulus was somewhat dilated.  There was no annular calcification.  Artificial neochord placement was performed using Chord-X multi-strand CV-4 Goretex pre-measured loops.  The appropriate cord length (20mm) was measured from corresponding normal length primary cords from the P3 segment of the posterior leaflet. The papillary muscle suture of a Chord-X multi-strand suture was placed through the head of the anterolateral papillary muscle in a horizontal mattress fashion and tied over Teflon felt pledgets. Each of the three pre-measured loops were then reimplanted into the free margin of the P1 and P2 segments of the posterior leaflet on the anterolateral side of midline.  The papillary muscle suture of a second Chord-X multi-strand suture was placed through the head of the posteromedial papillary muscle in a horizontal mattress fashion and tied over Teflon felt pledgets. Each of the three pre-measured loops were then reimplanted into the free margin of the P2 and P3 segments of the posterior leaflet on the posteromedial side of midline.    Interrupted 2-0 Ethibond horizontal mattress sutures are placed circumferentially around the entire mitral valve annulus. The sutures will ultimately be utilized for ring annuloplasty, and at this juncture there are utilized to suspend the valve symmetrically.  The valve  was tested  with saline and appeared competent, although 2 of the 3 pairs of artificial cords to the P2 and P3 segments of the posterior leaflet appeared slightly too short, causing some restricted leaflet coaptation.  These 2 cords were removed.  The valve was tested with saline and appeared competent even without ring annuloplasty complete. The valve was sized to a 36 mm annuloplasty ring, based upon the transverse distance between the left and right commissures and the height of the anterior leaflet, corresponding to a size just slightly larger than the overall surface area of the anterior leaflet.  A Medtronic Sinuform annuloplasty ring (size 38mm, model [id number], serial [id number]) was secured in place uneventfully. All ring sutures were secured using a Cor-knot device.    The valve was tested with saline and appeared competent. There is no residual leak. There was a broad, symmetrical line of coaptation of the anterior and posterior leaflet which was confirmed using the blue ink test.  Rewarming is begun.   Procedure Completion:  The atriotomy was closed using a 2-layer closure of running 3-0 Prolene suture after placing a sump drain across the mitral valve to serve as a left ventricular vent.  One final dose of warm retrograde "reanimation dose" cardioplegia was administered retrograde through the coronary sinus catheter while all air was evacuated through the aortic root.  The aortic cross clamp was removed after a total cross clamp time of 143 minutes.  Epicardial pacing wires are fixed to the inferior wall of the right ventricule and to the right atrial appendage. The patient is rewarmed to 37C temperature. The left ventricular vent and antegrade cardioplegia cannula are removed.  The pericardial sac was drained using a 32 French Bard drain placed through the anterior port incision. The patient is weaned and disconnected from cardiopulmonary bypass.  The patient's rhythm at separation from bypass was atrial  fibrillation.  The patient was weaned from bypass without any inotropic support. Total cardiopulmonary bypass time for the operation was 158 minutes.  Followup transesophageal echocardiogram performed after separation from bypass revealed  a well-seated annuloplasty ring in the mitral position with a normal functioning mitral valve. There was no residual leak.  Left ventricular function was unchanged from preoperatively.  The mean gradient across the mitral valve was estimated to be 3 mmHg.  The femoral arterial and venous cannulas were removed and all Perclose sutures secured.  Manual pressure was maintained while Protamine was administered.  The right internal jugular cannula was removed and manual pressure held on the neck and groin for 15 minutes.  Single lung ventilation was begun. The atriotomy closure was inspected for hemostasis.  The right pleural space is irrigated with saline solution and inspected for hemostasis.   A mixture of Exparel liposomal bupivacaine (20 mL) and 0.5% bupivacaine (30 mL) is utilized to create an intercostal nerve block for postoperative analgesia.  The mixture is injected under direct vision into the intercostal neurovascular bundles posteriorly to cover the second through the sixth intercostal nerve roots.  Portions of the solution are also injected into the intercostal neurovascular bundles immediately surrounding the surgical incision and immediately adjacent to the chest tube exit sites.  The right pleural space was drained using a 32 French Bard drain placed through the posterior port incision. The miniature thoracotomy incision was closed in multiple layers in routine fashion.   The post-bypass portion of the operation was notable for stable rhythm and hemodynamics.  No blood products were administered during the operation.  Disposition:  The patient tolerated the procedure well.  The patient was extubated in the operating room and subsequently transported  to the surgical intensive care unit in stable condition. There were no intraoperative complications. All sponge instrument and needle counts are verified correct at completion of the operation.     Valentina Gu. Roxy Manns MD 09/28/2019 1:57 PM

## 2019-09-29 ENCOUNTER — Inpatient Hospital Stay (HOSPITAL_COMMUNITY): Payer: Medicare Other

## 2019-09-29 ENCOUNTER — Encounter (HOSPITAL_COMMUNITY): Payer: Self-pay | Admitting: Thoracic Surgery (Cardiothoracic Vascular Surgery)

## 2019-09-29 DIAGNOSIS — I361 Nonrheumatic tricuspid (valve) insufficiency: Secondary | ICD-10-CM

## 2019-09-29 DIAGNOSIS — I352 Nonrheumatic aortic (valve) stenosis with insufficiency: Secondary | ICD-10-CM

## 2019-09-29 LAB — CBC
HCT: 25.2 % — ABNORMAL LOW (ref 39.0–52.0)
Hemoglobin: 8.4 g/dL — ABNORMAL LOW (ref 13.0–17.0)
MCH: 35.7 pg — ABNORMAL HIGH (ref 26.0–34.0)
MCHC: 33.3 g/dL (ref 30.0–36.0)
MCV: 107.2 fL — ABNORMAL HIGH (ref 80.0–100.0)
Platelets: 163 10*3/uL (ref 150–400)
RBC: 2.35 MIL/uL — ABNORMAL LOW (ref 4.22–5.81)
RDW: 14.8 % (ref 11.5–15.5)
WBC: 10.1 10*3/uL (ref 4.0–10.5)
nRBC: 0 % (ref 0.0–0.2)

## 2019-09-29 LAB — GLUCOSE, CAPILLARY
Glucose-Capillary: 103 mg/dL — ABNORMAL HIGH (ref 70–99)
Glucose-Capillary: 130 mg/dL — ABNORMAL HIGH (ref 70–99)
Glucose-Capillary: 135 mg/dL — ABNORMAL HIGH (ref 70–99)
Glucose-Capillary: 142 mg/dL — ABNORMAL HIGH (ref 70–99)
Glucose-Capillary: 145 mg/dL — ABNORMAL HIGH (ref 70–99)
Glucose-Capillary: 156 mg/dL — ABNORMAL HIGH (ref 70–99)

## 2019-09-29 LAB — POCT I-STAT 7, (LYTES, BLD GAS, ICA,H+H)
Acid-base deficit: 3 mmol/L — ABNORMAL HIGH (ref 0.0–2.0)
Bicarbonate: 23.1 mmol/L (ref 20.0–28.0)
Calcium, Ion: 1.22 mmol/L (ref 1.15–1.40)
HCT: 28 % — ABNORMAL LOW (ref 39.0–52.0)
Hemoglobin: 9.5 g/dL — ABNORMAL LOW (ref 13.0–17.0)
O2 Saturation: 96 %
Patient temperature: 36
Potassium: 4.3 mmol/L (ref 3.5–5.1)
Sodium: 144 mmol/L (ref 135–145)
TCO2: 25 mmol/L (ref 22–32)
pCO2 arterial: 45.4 mmHg (ref 32.0–48.0)
pH, Arterial: 7.31 — ABNORMAL LOW (ref 7.350–7.450)
pO2, Arterial: 89 mmHg (ref 83.0–108.0)

## 2019-09-29 LAB — MAGNESIUM: Magnesium: 2.5 mg/dL — ABNORMAL HIGH (ref 1.7–2.4)

## 2019-09-29 LAB — ECHOCARDIOGRAM LIMITED
Height: 75 in
Weight: 2948.87 oz

## 2019-09-29 LAB — BASIC METABOLIC PANEL
Anion gap: 9 (ref 5–15)
BUN: 20 mg/dL (ref 8–23)
CO2: 18 mmol/L — ABNORMAL LOW (ref 22–32)
Calcium: 7.4 mg/dL — ABNORMAL LOW (ref 8.9–10.3)
Chloride: 110 mmol/L (ref 98–111)
Creatinine, Ser: 1.09 mg/dL (ref 0.61–1.24)
GFR calc Af Amer: >60 mL/min (ref >60)
GFR calc non Af Amer: >60 mL/min (ref >60)
Glucose, Bld: 157 mg/dL — ABNORMAL HIGH (ref 70–99)
Potassium: 4.9 mmol/L (ref 3.5–5.1)
Sodium: 137 mmol/L (ref 135–145)

## 2019-09-29 MED ORDER — COUMADIN BOOK
Freq: Once | Status: AC
  Filled 2019-09-29: qty 1

## 2019-09-29 MED ORDER — ZOLPIDEM TARTRATE 5 MG PO TABS
5.0000 mg | ORAL_TABLET | Freq: Every evening | ORAL | Status: DC | PRN
  Administered 2019-09-29: 5 mg via ORAL
  Filled 2019-09-29: qty 1

## 2019-09-29 MED ORDER — KETOROLAC TROMETHAMINE 15 MG/ML IJ SOLN
15.0000 mg | Freq: Four times a day (QID) | INTRAMUSCULAR | Status: DC
  Administered 2019-09-29 – 2019-09-30 (×3): 15 mg via INTRAVENOUS
  Filled 2019-09-29 (×3): qty 1

## 2019-09-29 MED ORDER — ENOXAPARIN SODIUM 30 MG/0.3ML ~~LOC~~ SOLN
30.0000 mg | Freq: Every day | SUBCUTANEOUS | Status: DC
  Administered 2019-09-30 – 2019-10-03 (×4): 30 mg via SUBCUTANEOUS
  Filled 2019-09-29 (×4): qty 0.3

## 2019-09-29 MED ORDER — WARFARIN SODIUM 2.5 MG PO TABS
2.5000 mg | ORAL_TABLET | Freq: Every day | ORAL | Status: DC
  Administered 2019-09-29 – 2019-10-01 (×3): 2.5 mg via ORAL
  Filled 2019-09-29 (×3): qty 1

## 2019-09-29 MED ORDER — ASPIRIN EC 325 MG PO TBEC
325.0000 mg | DELAYED_RELEASE_TABLET | Freq: Every day | ORAL | Status: AC
  Administered 2019-09-29: 325 mg via ORAL
  Filled 2019-09-29: qty 1

## 2019-09-29 MED ORDER — FUROSEMIDE 10 MG/ML IJ SOLN
20.0000 mg | Freq: Four times a day (QID) | INTRAMUSCULAR | Status: DC
  Administered 2019-09-29 (×2): 20 mg via INTRAVENOUS
  Filled 2019-09-29 (×3): qty 2

## 2019-09-29 MED ORDER — INSULIN ASPART 100 UNIT/ML ~~LOC~~ SOLN
0.0000 [IU] | SUBCUTANEOUS | Status: DC
  Administered 2019-09-29 (×3): 2 [IU] via SUBCUTANEOUS

## 2019-09-29 MED ORDER — WARFARIN - PHYSICIAN DOSING INPATIENT: Freq: Every day | Status: DC

## 2019-09-29 MED ORDER — ASPIRIN EC 81 MG PO TBEC
81.0000 mg | DELAYED_RELEASE_TABLET | Freq: Every day | ORAL | Status: DC
  Administered 2019-09-30 – 2019-10-04 (×5): 81 mg via ORAL
  Filled 2019-09-29 (×5): qty 1

## 2019-09-29 MED FILL — Electrolyte-R (PH 7.4) Solution: INTRAVENOUS | Qty: 5000 | Status: AC

## 2019-09-29 MED FILL — Sodium Chloride IV Soln 0.9%: INTRAVENOUS | Qty: 3000 | Status: AC

## 2019-09-29 MED FILL — Sodium Bicarbonate IV Soln 8.4%: INTRAVENOUS | Qty: 50 | Status: AC

## 2019-09-29 MED FILL — Heparin Sodium (Porcine) Inj 1000 Unit/ML: INTRAMUSCULAR | Qty: 20 | Status: AC

## 2019-09-29 MED FILL — Potassium Chloride Inj 2 mEq/ML: INTRAVENOUS | Qty: 40 | Status: AC

## 2019-09-29 MED FILL — Lidocaine HCl Local Preservative Free (PF) Inj 2%: INTRAMUSCULAR | Qty: 15 | Status: AC

## 2019-09-29 MED FILL — Heparin Sodium (Porcine) Inj 1000 Unit/ML: INTRAMUSCULAR | Qty: 30 | Status: AC

## 2019-09-29 MED FILL — Mannitol IV Soln 20%: INTRAVENOUS | Qty: 500 | Status: AC

## 2019-09-29 NOTE — Progress Notes (Signed)
[facility] BRIEF SICU PROGRESS NOTE  1 Day Post-Op  S/P Procedure(s) (LRB): MINIMALLY INVASIVE MITRAL VALVE REPAIR (MVR) USING SIMUFORM 36MM (Right) possible MINIMALLY INVASIVE TRICUSPID VALVE REPAIR (Right) TRANSESOPHAGEAL ECHOCARDIOGRAM (TEE) (N/A) CLIPPING OF ATRIAL APPENDAGE USING ATRICURE CLIP SIZE 45MM (N/A)   Stable day although feels somewhat nauseated VVI paced w/ stable BP off Neo drip Breathing comfortably w/ O2 sats 97-99% on room air UOP adequate Labs pending ECHO w/ intact valve repair, no MR, no SAM, good LV function  Plan: Continue current plan.  Start low dose lasix.  Rexene Alberts, MD 09/29/2019 5:50 PM

## 2019-09-29 NOTE — Progress Notes (Addendum)
[facility] DAILY ICU PROGRESS NOTE                   [address]            [place] [address]          [phone]   1 Day Post-Op Procedure(s) (LRB): MINIMALLY INVASIVE MITRAL VALVE REPAIR (MVR) USING SIMUFORM 36MM (Right) possible MINIMALLY INVASIVE TRICUSPID VALVE REPAIR (Right) TRANSESOPHAGEAL ECHOCARDIOGRAM (TEE) (N/A) CLIPPING OF ATRIAL APPENDAGE USING ATRICURE CLIP SIZE 45MM (N/A)  Total Length of Stay:  LOS: 1 day   Subjective: Feels well, no specific c/o except minor shoulder discomfort  Objective: Vital signs in last 24 hours: Temp:  [96.8 F (36 C)-98.6 F (37 C)] 98.2 F (36.8 C) (08/05 0700) Pulse Rate:  [80-82] 80 (08/05 0700) Cardiac Rhythm: Ventricular paced (08/05 0000) Resp:  [11-25] 18 (08/05 0700) BP: (93-120)/(66-81) 98/69 (08/05 0600) SpO2:  [93 %-100 %] 96 % (08/05 0700) Arterial Line BP: (103-136)/(52-74) 122/56 (08/05 0700) Weight:  [83.6 kg] 83.6 kg (08/05 0630)  Filed Weights   09/28/19 0654 09/29/19 0630  Weight: 79.4 kg 83.6 kg    Weight change: 4.22 kg   Hemodynamic parameters for last 24 hours: PAP: (27-49)/(10-24) 36/14 CO:  [4 L/min-5.1 L/min] 5.1 L/min CI:  [1.9 L/min/m2-2.5 L/min/m2] 2.5 L/min/m2  Intake/Output from previous day: 08/04 0701 - 08/05 0700 In: 6504.4 [I.V.:4077.4; Blood:800; IV Piggyback:1627] Out: 9417 [Urine:2085; Blood:1655; Chest Tube:990]  Intake/Output this shift: No intake/output data recorded.  Current Meds: Scheduled Meds: . acetaminophen (TYLENOL) oral liquid 160 mg/5 mL  650 mg Per Tube Once  . acetaminophen  1,000 mg Oral Q6H  . aspirin EC  325 mg Oral Daily  . [START ON 09/30/2019] aspirin EC  81 mg Oral Daily  . bisacodyl  10 mg Oral Daily   Or  . bisacodyl  10 mg Rectal Daily  . Chlorhexidine Gluconate Cloth  6 each Topical Daily  . docusate sodium  200 mg Oral Daily  . [START ON 09/30/2019] enoxaparin (LOVENOX) injection  30 mg Subcutaneous QHS  . insulin aspart  0-24 Units  Subcutaneous Q4H  . mouth rinse  15 mL Mouth Rinse BID  . [START ON 09/30/2019] pantoprazole  40 mg Oral Daily  . sodium chloride flush  10-40 mL Intracatheter Q12H  . sodium chloride flush  3 mL Intravenous Q12H  . warfarin  2.5 mg Oral q1600  . Warfarin - Physician Dosing Inpatient   Does not apply q1600   Continuous Infusions: . sodium chloride    . cefUROXime (ZINACEF)  IV Stopped (09/28/19 2239)  . lactated ringers    . lactated ringers    . phenylephrine (NEO-SYNEPHRINE) Adult infusion 20 mcg/min (09/29/19 0700)   PRN Meds:.metoprolol tartrate, morphine injection, ondansetron (ZOFRAN) IV, oxyCODONE, sodium chloride flush, sodium chloride flush, traMADol  General appearance: alert, cooperative and no distress Heart: regular rate and rhythm Lungs: mildly dim right base Abdomen: benign Extremities: no edema Wound: dressings CDI  Lab Results: CBC: Recent Labs    09/28/19 2043 09/29/19 0318  WBC 18.9* 10.1  HGB 9.4* 8.4*  HCT 28.2* 25.2*  PLT 188 163   BMET:  Recent Labs    09/28/19 2043 09/29/19 0318  NA 136 137  K 5.1 4.9  CL 109 110  CO2 18* 18*  GLUCOSE 146* 157*  BUN 20 20  CREATININE 1.19 1.09  CALCIUM 7.7* 7.4*    CMET: Lab Results  Component Value Date   WBC  10.1 09/29/2019   HGB 8.4 (L) 09/29/2019   HCT 25.2 (L) 09/29/2019   PLT 163 09/29/2019   GLUCOSE 157 (H) 09/29/2019   CHOL 166 03/03/2011   TRIG 41.0 03/03/2011   HDL 55.80 03/03/2011   LDLCALC 102 (H) 03/03/2011   ALT 12 09/26/2019   AST 21 09/26/2019   NA 137 09/29/2019   K 4.9 09/29/2019   CL 110 09/29/2019   CREATININE 1.09 09/29/2019   BUN 20 09/29/2019   CO2 18 (L) 09/29/2019   PSA 3.18 03/03/2011   INR 1.8 (H) 09/28/2019   HGBA1C 5.7 (H) 09/26/2019      PT/INR:  Recent Labs    09/28/19 1502  LABPROT 20.6*  INR 1.8*   Radiology: [facility] Chest Port 1 View  Result Date: 09/29/2019 CLINICAL DATA:  Chest tube.  Open-heart surgery. EXAM: PORTABLE CHEST 1 VIEW COMPARISON:   Chest x-ray 09/28/2019 FINDINGS: Swan-Ganz catheter, mediastinal drainage catheter, right chest tube in stable position. Miniscule right apical pneumothorax cannot be excluded on today's exam. Prior cardiac valve replacement. Left atrial appendage clip in stable position. Stable cardiomegaly. No pulmonary venous congestion. Persistent right base atelectasis. No interim change. IMPRESSION: 1. Swan-Ganz catheter, mediastinal drainage catheter, right chest tube in stable position. Miniscule right apical pneumothorax cannot be completely excluded on today's exam. 2. Prior cardiac valve replacement. Left atrial appendage clip in stable position. Stable cardiomegaly. No pulmonary venous congestion. 3.  Persistent right base atelectasis without interim change. These results will be called to the ordering clinician or representative by the Radiologist Assistant, and communication documented in the PACS or [employer]. Electronically Signed   By: Marcello Moores  Register   On: 09/29/2019 06:49   DG Chest Port 1 View  Result Date: 09/28/2019 CLINICAL DATA:  Status post mitral valve repair EXAM: PORTABLE CHEST 1 VIEW COMPARISON:  September 26, 2019 FINDINGS: Patient is status post mitral valve replacement. Atrial appendage clamp present. Swan-Ganz catheter tip is in the main pulmonary outflow tract. There is a chest tube on the right. Temporary pacemaker wires are attached to the right heart. No pneumothorax. There is mild right base atelectasis. Lungs elsewhere are clear. Heart is upper normal in size with pulmonary vascularity normal. No adenopathy. No bone lesions. IMPRESSION: Postoperative changes. No pneumothorax. Atelectasis right base. Lungs elsewhere clear. Heart upper normal in size. Electronically Signed   By: Lowella Grip III M.D.   On: 09/28/2019 14:43   ECHO INTRAOPERATIVE TEE  Result Date: 09/28/2019  *INTRAOPERATIVE TRANSESOPHAGEAL REPORT *  Patient Name:   [place] Date of Exam: 09/28/2019 Medical  Rec #:  [id number]           Height:       75.0 in Accession #:    [id number]          Weight:       175.0 lb Date of Birth:  Aug 22, 1950            BSA:          2.07 m Patient Age:    69 years            BP:           103/84 mmHg Patient Gender: M                   HR:           77 bpm. Exam Location:  Inpatient Transesophogeal exam was perform intraoperatively during surgical procedure. Patient was closely monitored under  general anesthesia during the entirety of examination. Indications:     Mitral valve repair Performing Phys: [place] Diagnosing Phys: Roderic Palau MD Complications: No known complications during this procedure. POST-OP IMPRESSIONS - Left Ventricle: The left ventricle is unchanged from pre-bypass. - Left Atrial Appendage: The left atrial appendage has been clipped and is no longer visible. - Aortic Valve: The aortic valve appears unchanged from pre-bypass. - Mitral Valve: Mitral ring in place. Leaflets coapt well. Trivial MR noted. Gradients across the mitral valve are normal (mean gradient 3-4 mmHg). - Tricuspid Valve: The tricuspid valve appears unchanged from pre-bypass. PRE-OP FINDINGS  Left Ventricle: The left ventricle has low normal systolic function, with an ejection fraction of 50-55%. The cavity size was mildly dilated. Difficult to get a deep transgastric view of the LV. Right Ventricle: The right ventricle has normal systolic function. The cavity was mildly enlarged. There is no increase in right ventricular wall thickness. Left Atrium: Left atrial size was dilated. The left atrial appendage is well visualized and there is no evidence of thrombus present. Right Atrium: Interatrial Septum: No atrial level shunt detected by color flow Doppler. Pericardium: The pericardium was not assessed. Mitral Valve: The mitral valve is myxomatous. No thickening of the mitral valve leaflet. No calcification of the mitral valve leaflet. Mitral valve regurgitation is moderate to severe  by color flow Doppler. The MR jet is centrally-directed. There is moderate prolapse of of the mitral valve. There is No evidence of mitral stenosis. Tricuspid Valve: The tricuspid valve was normal in structure. Tricuspid valve regurgitation is mild by color flow Doppler. There is mild prolapse of the tricuspid. Aortic Valve: The aortic valve is normal in structure. Aortic valve regurgitation was not visualized by color flow Doppler. There is no evidence of aortic valve stenosis. There is no evidence of a vegetation on the aortic valve. Pulmonic Valve: The pulmonic valve was normal in structure. Pulmonic valve regurgitation is trivial by color flow Doppler. +-------------+---------++ MITRAL VALVE           +-------------+---------++ MV Peak grad:9.5 mmHg  +-------------+---------++ MV Mean grad:3.5 mmHg  +-------------+---------++ MV Vmax:     1.54 m/s  +-------------+---------++ MV Vmean:    75.8 cm/s +-------------+---------++ MV VTI:      0.28 m    +-------------+---------++  Roderic Palau MD Electronically signed by [place] MD Signature Date/Time: 09/28/2019/3:08:52 PM    Final      Assessment/Plan: S/P Procedure(s) (LRB): MINIMALLY INVASIVE MITRAL VALVE REPAIR (MVR) USING SIMUFORM 36MM (Right) possible MINIMALLY INVASIVE TRICUSPID VALVE REPAIR (Right) TRANSESOPHAGEAL ECHOCARDIOGRAM (TEE) (N/A) CLIPPING OF ATRIAL APPENDAGE USING ATRICURE CLIP SIZE 45MM (N/A)   1 doing well POD#1 , MIMVRepair 2 hemodyn stable on neo. Should be a ble to de-line once off. V-paced. Sinus brady with PVC's underneath 3 extubated per protocol, sats good on 2 liters/RA, CXR shows tiny apical space<5%. + right stable basilar atx 4 expected acute blood loss anemia- monitor clinically, not at transfusion threshold. Chest tubes with 990 cc post op drainage, keep in place 5 normal renal fxn per BUN/Creat, good spont diuresis but will probably need some diuretic 6 BS adeq controlled per  usual protocols, HG A1c 5.7 on no meds preop/No DM hx. Stop gtt 7 routine pos-o pulm toilet and cardiac rehab, see progression orders   John Giovanni PA-C Pager [phone] 09/29/2019 7:01 AM    I have seen and examined the patient and agree with the assessment and plan as outlined.  Doing very well POD1.  VVI pacing with slow underlying junctional rhythm.  Stable hemodynamics on low dose Neo for BP support.  Breathing comfortably w/ O2 sats 96-98% on 2 L/min and CXR looks good.  Expected post op volume excess, weight reportedly 3-4 kg > preop, UOP adequate.  Expected post op acute blood loss anemia, Hgb 8.4 this morning.  Continue routine early post-op and check limited ECHO due to soft systolic murmur on exam.   Rexene Alberts, MD 09/29/2019 8:01 AM

## 2019-09-29 NOTE — Progress Notes (Signed)
Per night shift RN report:  Pt orthostatic overnight with increased need for pressor support (neo 20>30) and nausea with standing Otherwise uneventful overnight  0800 Swan removed without complication  1438 First time up ambulating; symptomatic orthostasis remains. Emesis x 1 while seated, but pt still wanting to ambulate. Great improvement in comfort walking without mask; steady gait.  1100 Back to bed for echo C/o HA (dull); scheduled tylenol given  1200 HA continues; PRN tramadol given. Pt napping.  1300 HA continues. Oxycodone given at pt request; he takes this at home PRN without HA. Emesis x 1 Zofran given and oxycodone readministered.  1300 HA continues; per pt, PA rounded and attributed HA and nausea to anesthesia. Neo off  1600 Walked again; pt reports endurance limited 2/2 malaise. Ambien ordered at pt request (half of home dose)  1800 Third walk of shift; complete lap of unit Malaise continues but pt continues to make great efforts towards mobility despite not feeling well. Emesis x 1 ~300cc bilious A-line d/c'ed

## 2019-09-29 NOTE — Discharge Summary (Addendum)
Physician Discharge Summary  Patient ID: [place] MRN: [id number] DOB/AGE: 09/20/1950 69 y.o.  Admit date: 09/28/2019 Discharge date: 10/04/2019  Admission Diagnoses:  Discharge Diagnoses:  Principal Problem:   S/P minimally-invasive mitral valve repair Active Problems:   Permanent atrial fibrillation (HCC)   Mitral regurgitation   Nonrheumatic mitral (valve) insufficiency   Tricuspid regurgitation  Patient Active Problem List   Diagnosis Date Noted   S/P minimally-invasive mitral valve repair 09/28/2019   Tricuspid regurgitation    Nonrheumatic mitral (valve) insufficiency    Prostate cancer ([place]) 06/02/2019   Malignant neoplasm of prostate ([place]) 04/08/2019   Neuropathy 07/27/2018   Lumbar radiculopathy 07/27/2018   Hip pain 11/30/2017   Osteoarthritis of right hip 01/29/2017   Vertigo 01/07/2017   Erectile dysfunction 04/18/2015   Metatarsalgia 05/14/2011   Loss of transverse plantar arch 05/14/2011   Mitral regurgitation 08/07/2010   Osteoarthritis 08/07/2010   Permanent atrial fibrillation (HCC)      HPI:at time of surgical evaluation   Patient is a 69 year old male who has been referred for surgical consultation to discuss treatment options for management of mitral valve prolapse with severe symptomatic primary mitral regurgitation and permanent atrial fibrillation.   Patient has long history of mitral valve prolapse and atrial fibrillation first diagnosed nearly 40 years ago.  He initially underwent cardioversion and was treated with quinidine and maintained sinus rhythm for more than 10 years.  He later developed recurrent persistent atrial fibrillation for which she has been treated with long-term anticoagulation and rate control.  He was followed for many years by Dr. Mare Ferrari and more recently he has been followed by Dr. Oval Linsey.  He states that his only been during the last several years that he has known of presence of a heart murmur.  Echocardiogram  performed in 2011 reportedly revealed holosystolic mitral valve prolapse with moderate mitral regurgitation.  Follow-up echocardiogram January 2020 revealed mitral valve prolapse with moderate mitral regurgitation and normal left ventricular systolic function with ejection fraction estimated 60 to 65%.     Patient recently developed prostate cancer for which he underwent robotic prostatectomy in April 2021.  He recovered uneventfully and did not have problems with congestive heart failure during his brief hospitalization.  He does complain of exertional shortness of breath with more strenuous exertion, such as walking up a hill or walking on an elliptical trainer at a brisk pace.  He also reports that he gets short of breath walking up a flight of stairs.   Recent follow-up transthoracic echocardiogram revealed mitral valve prolapse with "moderate to severe mitral regurgitation" with drop in left ventricular systolic function and ejection fraction estimated only 50 to 55%.  Transesophageal echocardiogram was performed August 24, 2019 and confirmed the presence of bileaflet prolapse with severe mitral regurgitation and mildly reduced left ventricular systolic function.  There was no definite flail segment in either leaflet but there was severe mitral regurgitation with systolic flow reversal in the pulmonary veins.  Ejection fraction was estimated 50 to 55%.  There was moderate right ventricular systolic dysfunction with mild right ventricular chamber enlargement and moderate tricuspid regurgitation.  Patient was referred to the multidisciplinary heart valve clinic and has been evaluated previously by Dr. Burt Knack.  Diagnostic cardiac catheterization was performed and revealed nonobstructive calcified plaque in the left anterior descending coronary artery with otherwise minimal nonobstructive coronary artery disease.  Right heart pressures were normal although there was a large V wave and pulmonary capillary wedge  tracing consistent with severe mitral  regurgitation.  Cardiothoracic surgical consultation was requested.   Patient is married and lives locally in [place] with his wife.  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  He remains physically active and exercises on a regular basis.  He admits to exertional shortness of breath with more strenuous physical exertion as noted previously.  He denies any history of resting shortness of breath, PND, orthopnea, palpitations, dizzy spells, or syncope.  He does get some lower extremity edema, typically worse at the end of the day.  He has been chronically anticoagulated for many years and was on Coumadin for a long time but more recently has been anticoagulated using Eliquis.  He has not had any significant bleeding complications and reports only occasional minor epistaxis.  He has never had any history of TIA or stroke.  He does not experience palpitations and he has not had problems with tachybradycardia symptoms nor issues with rate control.   Patient returns to the office today for follow-up with tentative plans to proceed with elective mitral valve repair later this week.  He was originally seen in consultation on September 08, 2019.  He reports no new problems or complaints over the last few weeks.  He stopped taking Eliquis in anticipation of his upcoming surgery.  Dr. Roxy Manns evaluated the patient and his studies and recommended proceeding with minimally invasive mitral valve repair and he was admitted this hospitalization electively for the procedure.    Discharged Condition: good  Hospital Course: The patient was admitted electively and on 09/28/2019 he was taken to the operating room where he underwent the below described procedure.  He tolerated it well and was taken to the surgical intensive care unit in stable condition.  Postoperative hospital course:  The patient  has done well.  He was extubated without difficulty using standard post cardiac surgery protocols.  Oxygen has been weaned and he maintains good saturations on room air.  He has maintained stable hemodynamics.  He did have a limited echocardiogram on postoperative day #1 which showed trivial mitral regurgitation is mild mitral stenosis..  he initially did require VVI pacing for an underlying junctional rhythm.  This is improved over time and he maintains a stable atrial fibrillation with controlled ventricular response with heart rate in the 70s primarily.  He did have one episode on postop day 6 of wide-complex tachycardia for a short burst and is felt that he is stable to resume low-dose metoprolol at this time.  He does have an expected acute blood loss anemia.  Most recent hemoglobin hematocrit dated 8/9 is 8.2/24.6.  He has been started on Trinsicon.  He does have some mild expected volume overload but is diuresing well spontaneously.  Renal function is within normal limits.  Most recent BUN and creatinine dated 8/9 are 25/1.39 .  He appears to be euvolemic and diuretics have been stopped.  Chest tubes had moderate drainage throughout the first few days but were removed on 8/9.  Coumadin was initiated with daily INR is monitored.  He is tolerating routine cardiac rehab.  Oxygen has been weaned and he maintains good saturations on room air.  Incisions are healing well without evidence of infection.  He is tolerating diet.  Overall at the time of discharge he is felt to be quite stable.  His most recent INR is 1.4 he will be discharged on Coumadin 5 mg daily with INR to be  checked next week.  Consults: None  Significant Diagnostic Studies: limited echo, routine post op labs and serial CXR's  Treatments: surgery:  CARDIOTHORACIC SURGERY OPERATIVE NOTE   Date of Procedure:                09/28/2019   Preoperative Diagnosis:      Severe Mitral Regurgitation   Postoperative Diagnosis:    Same     Procedure:        Minimally-Invasive Mitral Valve Repair             Complex valvuloplasty including artificial Gore-tex neochord placement x6             Medtronic Sinuform Ring Annuloplasty (size 81mm, model # [id number], serial # [id number])             Clipping of left atrial appendage (Atricure Pro245 left atrial clip, size 45 mm)                Surgeon:        Valentina Gu. 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 rate 17, height 6\' 3"  (1.905 m), weight 74.6 kg, SpO2 99 %.   General appearance: alert, cooperative and no distress Heart: irregularly irregular rhythm Lungs: clear to auscultation bilaterally Abdomen: benign Extremities: no edema Wound: incis healing well, echymosis is stable Disposition:  Discharge disposition: 01-Home or Self Care       Discharge Instructions     Amb Referral to Cardiac Rehabilitation   Complete by: As directed    Diagnosis: Valve Repair   Valve: Mitral Comment - minimally invasive   After initial evaluation and assessments completed: Virtual Based Care may be provided alone or in conjunction with Phase 2 Cardiac Rehab based on patient barriers.: Yes   Discharge patient   Complete by: As directed    Discharge disposition: 01-Home or Self Care   Discharge patient date: 10/04/2019      Allergies as of 10/04/2019   No Known Allergies      Medication List     STOP taking these  medications    Eliquis 5 MG Tabs tablet Generic drug: apixaban   oxyCODONE-acetaminophen 10-325 MG tablet Commonly known as: PERCOCET   sildenafil 100 MG tablet Commonly known as: VIAGRA   vitamin B-12 1000 MCG tablet Commonly known as: CYANOCOBALAMIN       TAKE these medications    ascorbic acid 500 MG tablet Commonly known as: VITAMIN C Take 500 mg by mouth daily.   aspirin 81 MG EC tablet Take 1 tablet (81 mg total) by mouth daily. Swallow whole. Start taking on: October 05, 2019   ferrous [id number] C-folic acid capsule Commonly known as: TRINSICON / FOLTRIN Take 1 capsule by mouth daily with breakfast. Start taking on: October 05, 2019   hydrocortisone cream 1 % Apply 1 application topically daily.   metoprolol tartrate 25 MG tablet Commonly known as:  LOPRESSOR Take 0.5 tablets (12.5 mg total) by mouth 2 (two) times daily. What changed:  medication strength how much to take when to take this   multivitamin with minerals tablet Take 1 tablet by mouth daily. [facility]   oxyCODONE 5 MG immediate release tablet Commonly known as: Oxy IR/ROXICODONE Take 1 tablet (5 mg total) by mouth every 6 (six) hours as needed for up to 7 days for severe pain. What changed: when to take this   polyethylene glycol 17 g packet Commonly known as: MIRALAX / GLYCOLAX Take 17 g by mouth daily.   warfarin 5 MG tablet Commonly known as: COUMADIN Take 1 tablet (5 mg total) by mouth daily at 4 PM. As directed by coumadin clinic   zolpidem 12.5 MG CR tablet Commonly known as: AMBIEN CR Take 12.5 mg by mouth at bedtime.        Follow-up Information     [name], MD Follow up on 10/14/2019.   Specialty: Cardiology Why: 9:40AM. Cardiology visit Contact information: [address] [place] [address] [phone]         [facility] Follow up on 10/03/2019.   Specialty: Cardiology Why: 2:45pm. Coumadin clinic visit Contact  information: [address] [place] [phone]        [facility] [place] Go on 10/10/2019.   Specialty: Cardiothoracic Surgery Why: Your appointment is on Monday, 10/10/19 at 1:30.  Please arrive 30 minutes early for a chest x-ray to be performed by [facility] located on the first floor of the same building Contact information: [place], [place] [phone]                Signed: John Giovanni PA-C 10/04/2019, 10:22 AM

## 2019-09-29 NOTE — Addendum Note (Signed)
Addendum  created 09/29/19 1033 by Josephine Igo, CRNA   Order list changed

## 2019-09-29 NOTE — Hospital Course (Addendum)
The patient is doing well postoperative day #1. He remains hemodynamically stable on low-dose neo for blood pressure support. He is also V paced with an underlying junctional rhythm. He was extubated per usual protocols without difficulty and has been weaned from oxygen with good saturations on room air. He does have an expected acute blood loss anemia which we are monitoring clinically. He does have some mild postoperative volume overload but is having a adequate spontaneous diuresis. He does have a slight systolic murmur on exam so we checked a limited echocardiogram on postop day #1 which revealed intact mitral valve repair with no residual MR and no systolic anterior motion.  LV function was normal with EF estimated 50-55%.   On postop day 2 he continued to do well.  He was noted to have atrial fibrillation with heart rate in the 50s underlying his VVI pacer.  His blood pressure has remained stable and he is breathing comfortably on room air.  Chest x-ray is stable in appearance with some minor atelectasis in the right base.  His expected acute blood loss anemia did drift slightly lower to a hemoglobin of 7.9 and he is continued on a course of diuretics as well as Trinsicon.  His central line and Foley were discontinued today.  His chest tubes were left in place until the output decreases further.  He does started on Coumadin for mitral valve repair.  He was transferred to 4 E.  On postoperative day #3 continue to make satisfactory progress.  His activity was advanced.  His chest tubes were left in place due to ongoing drainage.  He continued to have atrial fibrillation with a slow ventricular response so V pacing was continued.  He was making slow but steady progress in his physical rehabilitation.  On postoperative day #4 he continued to make satisfactory progress.  His chest tubes were left on additional day due to continued drainage.  His INR was 1.3 x 2 days so his Coumadin dosage was increased to 4  mg.  He did have some nausea and vomiting and was started on a short course of Reglan.  His pacer was discontinued on this day as his ventricular response rate to atrial fibrillation was adequate.  On postoperative day #5 his nausea and GI symptoms showed significant improvements in his diet was tolerating.  His INR did remain subtherapeutic so his Coumadin was increased to 6 mg.  His chest tubes were removed on this day.  His pacer wires were removed as well.  Tentatively he is felt to be stable for discharge in the next 1 to 2 days.  On postop day 6 the patient continued to progress quite well.  He did have a short burst of wide-complex tachycardia and we will resume his low-dose beta-blocker.  He is otherwise felt to be quite stable for discharge.

## 2019-09-30 ENCOUNTER — Other Ambulatory Visit: Payer: Self-pay

## 2019-09-30 ENCOUNTER — Inpatient Hospital Stay (HOSPITAL_COMMUNITY): Payer: Medicare Other

## 2019-09-30 DIAGNOSIS — Z9889 Other specified postprocedural states: Secondary | ICD-10-CM

## 2019-09-30 DIAGNOSIS — I340 Nonrheumatic mitral (valve) insufficiency: Secondary | ICD-10-CM

## 2019-09-30 LAB — CBC WITH DIFFERENTIAL/PLATELET
Abs Immature Granulocytes: 0.08 10*3/uL — ABNORMAL HIGH (ref 0.00–0.07)
Basophils Absolute: 0 10*3/uL (ref 0.0–0.1)
Basophils Relative: 0 %
Eosinophils Absolute: 0 10*3/uL (ref 0.0–0.5)
Eosinophils Relative: 0 %
HCT: 24 % — ABNORMAL LOW (ref 39.0–52.0)
Hemoglobin: 7.9 g/dL — ABNORMAL LOW (ref 13.0–17.0)
Immature Granulocytes: 1 %
Lymphocytes Relative: 7 %
Lymphs Abs: 1.2 10*3/uL (ref 0.7–4.0)
MCH: 35.1 pg — ABNORMAL HIGH (ref 26.0–34.0)
MCHC: 32.9 g/dL (ref 30.0–36.0)
MCV: 106.7 fL — ABNORMAL HIGH (ref 80.0–100.0)
Monocytes Absolute: 1.1 10*3/uL — ABNORMAL HIGH (ref 0.1–1.0)
Monocytes Relative: 7 %
Neutro Abs: 13.3 10*3/uL — ABNORMAL HIGH (ref 1.7–7.7)
Neutrophils Relative %: 85 %
Platelets: 161 10*3/uL (ref 150–400)
RBC: 2.25 MIL/uL — ABNORMAL LOW (ref 4.22–5.81)
RDW: 15.4 % (ref 11.5–15.5)
WBC: 15.7 10*3/uL — ABNORMAL HIGH (ref 4.0–10.5)
nRBC: 0.4 % — ABNORMAL HIGH (ref 0.0–0.2)

## 2019-09-30 LAB — BASIC METABOLIC PANEL
Anion gap: 9 (ref 5–15)
BUN: 29 mg/dL — ABNORMAL HIGH (ref 8–23)
CO2: 23 mmol/L (ref 22–32)
Calcium: 8.1 mg/dL — ABNORMAL LOW (ref 8.9–10.3)
Chloride: 106 mmol/L (ref 98–111)
Creatinine, Ser: 1.49 mg/dL — ABNORMAL HIGH (ref 0.61–1.24)
GFR calc Af Amer: 55 mL/min — ABNORMAL LOW (ref >60)
GFR calc non Af Amer: 48 mL/min — ABNORMAL LOW (ref >60)
Glucose, Bld: 122 mg/dL — ABNORMAL HIGH (ref 70–99)
Potassium: 4.5 mmol/L (ref 3.5–5.1)
Sodium: 138 mmol/L (ref 135–145)

## 2019-09-30 LAB — PROTIME-INR
INR: 1.5 — ABNORMAL HIGH (ref 0.8–1.2)
Prothrombin Time: 17.5 seconds — ABNORMAL HIGH (ref 11.4–15.2)

## 2019-09-30 LAB — MAGNESIUM: Magnesium: 2.1 mg/dL (ref 1.7–2.4)

## 2019-09-30 MED ORDER — ZOLPIDEM TARTRATE 5 MG PO TABS
5.0000 mg | ORAL_TABLET | Freq: Every evening | ORAL | Status: DC | PRN
  Administered 2019-10-01 – 2019-10-03 (×4): 5 mg via ORAL
  Filled 2019-09-30 (×4): qty 1

## 2019-09-30 MED ORDER — FE FUMARATE-B12-VIT C-FA-IFC PO CAPS
1.0000 | ORAL_CAPSULE | Freq: Every day | ORAL | Status: DC
  Administered 2019-09-30 – 2019-10-04 (×5): 1 via ORAL
  Filled 2019-09-30 (×5): qty 1

## 2019-09-30 MED ORDER — POTASSIUM CHLORIDE CRYS ER 20 MEQ PO TBCR
20.0000 meq | EXTENDED_RELEASE_TABLET | Freq: Two times a day (BID) | ORAL | Status: DC
  Administered 2019-10-01 – 2019-10-02 (×4): 20 meq via ORAL
  Filled 2019-09-30 (×4): qty 1

## 2019-09-30 MED ORDER — FUROSEMIDE 10 MG/ML IJ SOLN
20.0000 mg | Freq: Four times a day (QID) | INTRAMUSCULAR | Status: AC
  Administered 2019-09-30 (×3): 20 mg via INTRAVENOUS
  Filled 2019-09-30 (×3): qty 2

## 2019-09-30 MED ORDER — FUROSEMIDE 40 MG PO TABS
40.0000 mg | ORAL_TABLET | Freq: Two times a day (BID) | ORAL | Status: DC
  Administered 2019-10-01 – 2019-10-02 (×4): 40 mg via ORAL
  Filled 2019-09-30 (×4): qty 1

## 2019-09-30 MED ORDER — ~~LOC~~ CARDIAC SURGERY, PATIENT & FAMILY EDUCATION: Freq: Once | Status: DC

## 2019-09-30 NOTE — Progress Notes (Addendum)
[address]       [place], [address]             [phone]      2 Days Post-Op Procedure(s) (LRB): MINIMALLY INVASIVE MITRAL VALVE REPAIR (MVR) USING SIMUFORM 36MM (Right) possible MINIMALLY INVASIVE TRICUSPID VALVE REPAIR (Right) TRANSESOPHAGEAL ECHOCARDIOGRAM (TEE) (N/A) CLIPPING OF ATRIAL APPENDAGE USING ATRICURE CLIP SIZE 45MM (N/A) Subjective: Somewhat of a "rough" night with not sleeping well , nausea with some vomiting- appears better this am and was able to eat a little  Objective: Vital signs in last 24 hours: Temp:  [98.1 F (36.7 C)-98.9 F (37.2 C)] 98.6 F (37 C) (08/06 0400) Pulse Rate:  [79-80] 80 (08/06 0700) Cardiac Rhythm: Ventricular paced (08/05 2000) Resp:  [12-25] 12 (08/06 0700) BP: (91-125)/(62-88) 117/62 (08/06 0600) SpO2:  [94 %-100 %] 96 % (08/06 0700) Arterial Line BP: (93-147)/(44-78) 93/78 (08/05 1800)  Hemodynamic parameters for last 24 hours: PAP: (31)/(10) 31/10  Intake/Output from previous day: 08/05 0701 - 08/06 0700 In: 461 [P.O.:240; I.V.:97.2; IV Piggyback:123.8] Out: 1690 [Urine:1260; Chest Tube:430] Intake/Output this shift: No intake/output data recorded.  General appearance: alert, cooperative and no distress Heart: regular rate and rhythm Lungs: clear to auscultation bilaterally Abdomen: benign Extremities: no edema Wound: dressings intact  Lab Results: Recent Labs    09/29/19 0318 09/30/19 0608  WBC 10.1 15.7*  HGB 8.4* 7.9*  HCT 25.2* 24.0*  PLT 163 161   BMET:  Recent Labs    09/28/19 2043 09/29/19 0318  NA 136 137  K 5.1 4.9  CL 109 110  CO2 18* 18*  GLUCOSE 146* 157*  BUN 20 20  CREATININE 1.19 1.09  CALCIUM 7.7* 7.4*    PT/INR:  Recent Labs    09/30/19 0608  LABPROT 17.5*  INR 1.5*   ABG    Component Value Date/Time   PHART 7.310 (L) 09/28/2019 1450   HCO3 23.1 09/28/2019 1450   TCO2 25 09/28/2019 1450   ACIDBASEDEF 3.0 (H) 09/28/2019 1450   O2SAT 96.0  09/28/2019 1450   CBG (last 3)  Recent Labs    09/29/19 1516 09/29/19 2008 09/29/19 2341  GLUCAP 142* 135* 103*    Meds Scheduled Meds: . acetaminophen (TYLENOL) oral liquid 160 mg/5 mL  650 mg Per Tube Once  . acetaminophen  1,000 mg Oral Q6H  . aspirin EC  81 mg Oral Daily  . bisacodyl  10 mg Oral Daily   Or  . bisacodyl  10 mg Rectal Daily  . Chlorhexidine Gluconate Cloth  6 each Topical Daily  . [facility], Patient & Family Education   Does not apply Once  . docusate sodium  200 mg Oral Daily  . enoxaparin (LOVENOX) injection  30 mg Subcutaneous QHS  . ferrous [id number] C-folic acid  1 capsule Oral Q breakfast  . furosemide  20 mg Intravenous Q6H  . ketorolac  15 mg Intravenous Q6H  . mouth rinse  15 mL Mouth Rinse BID  . pantoprazole  40 mg Oral Daily  . sodium chloride flush  10-40 mL Intracatheter Q12H  . sodium chloride flush  3 mL Intravenous Q12H  . warfarin  2.5 mg Oral q1600  . Warfarin - Physician Dosing Inpatient   Does not apply q1600   Continuous Infusions: . sodium chloride    . cefUROXime (ZINACEF)  IV 1.5 g (09/29/19 2153)  . lactated ringers    . lactated ringers  PRN Meds:.metoprolol tartrate, morphine injection, ondansetron (ZOFRAN) IV, oxyCODONE, sodium chloride flush, sodium chloride flush, traMADol, zolpidem  Xrays DG Chest Port 1 View  Result Date: 09/29/2019 CLINICAL DATA:  Chest tube.  Open-heart surgery. EXAM: PORTABLE CHEST 1 VIEW COMPARISON:  Chest x-ray 09/28/2019 FINDINGS: Swan-Ganz catheter, mediastinal drainage catheter, right chest tube in stable position. Miniscule right apical pneumothorax cannot be excluded on today's exam. Prior cardiac valve replacement. Left atrial appendage clip in stable position. Stable cardiomegaly. No pulmonary venous congestion. Persistent right base atelectasis. No interim change. IMPRESSION: 1. Swan-Ganz catheter, mediastinal drainage catheter, right chest tube in stable  position. Miniscule right apical pneumothorax cannot be completely excluded on today's exam. 2. Prior cardiac valve replacement. Left atrial appendage clip in stable position. Stable cardiomegaly. No pulmonary venous congestion. 3.  Persistent right base atelectasis without interim change. These results will be called to the ordering clinician or representative by the Radiologist Assistant, and communication documented in the PACS or [employer]. Electronically Signed   By: Marcello Moores  Register   On: 09/29/2019 06:49   DG Chest Port 1 View  Result Date: 09/28/2019 CLINICAL DATA:  Status post mitral valve repair EXAM: PORTABLE CHEST 1 VIEW COMPARISON:  September 26, 2019 FINDINGS: Patient is status post mitral valve replacement. Atrial appendage clamp present. Swan-Ganz catheter tip is in the main pulmonary outflow tract. There is a chest tube on the right. Temporary pacemaker wires are attached to the right heart. No pneumothorax. There is mild right base atelectasis. Lungs elsewhere are clear. Heart is upper normal in size with pulmonary vascularity normal. No adenopathy. No bone lesions. IMPRESSION: Postoperative changes. No pneumothorax. Atelectasis right base. Lungs elsewhere clear. Heart upper normal in size. Electronically Signed   By: Lowella Grip III M.D.   On: 09/28/2019 14:43   ECHO INTRAOPERATIVE TEE  Result Date: 09/28/2019  *INTRAOPERATIVE TRANSESOPHAGEAL REPORT *  Patient Name:   Raymond Ray Date of Exam: 09/28/2019 Medical Rec #:  [id number]           Height:       75.0 in Accession #:    [id number]          Weight:       175.0 lb Date of Birth:  1950-11-07            BSA:          2.07 m Patient Age:    69 years            BP:           103/84 mmHg Patient Gender: M                   HR:           77 bpm. Exam Location:  Inpatient Transesophogeal exam was perform intraoperatively during surgical procedure. Patient was closely monitored under general anesthesia during the entirety of  examination. Indications:     Mitral valve repair Performing Phys: [place] Diagnosing Phys: Roderic Palau MD Complications: No known complications during this procedure. POST-OP IMPRESSIONS - Left Ventricle: The left ventricle is unchanged from pre-bypass. - Left Atrial Appendage: The left atrial appendage has been clipped and is no longer visible. - Aortic Valve: The aortic valve appears unchanged from pre-bypass. - Mitral Valve: Mitral ring in place. Leaflets coapt well. Trivial MR noted. Gradients across the mitral valve are normal (mean gradient 3-4 mmHg). - Tricuspid Valve: The tricuspid valve appears unchanged from pre-bypass. PRE-OP FINDINGS  Left Ventricle: The left ventricle has low normal systolic function, with an ejection fraction of 50-55%. The cavity size was mildly dilated. Difficult to get a deep transgastric view of the LV. Right Ventricle: The right ventricle has normal systolic function. The cavity was mildly enlarged. There is no increase in right ventricular wall thickness. Left Atrium: Left atrial size was dilated. The left atrial appendage is well visualized and there is no evidence of thrombus present. Right Atrium: Interatrial Septum: No atrial level shunt detected by color flow Doppler. Pericardium: The pericardium was not assessed. Mitral Valve: The mitral valve is myxomatous. No thickening of the mitral valve leaflet. No calcification of the mitral valve leaflet. Mitral valve regurgitation is moderate to severe by color flow Doppler. The MR jet is centrally-directed. There is moderate prolapse of of the mitral valve. There is No evidence of mitral stenosis. Tricuspid Valve: The tricuspid valve was normal in structure. Tricuspid valve regurgitation is mild by color flow Doppler. There is mild prolapse of the tricuspid. Aortic Valve: The aortic valve is normal in structure. Aortic valve regurgitation was not visualized by color flow Doppler. There is no evidence of aortic  valve stenosis. There is no evidence of a vegetation on the aortic valve. Pulmonic Valve: The pulmonic valve was normal in structure. Pulmonic valve regurgitation is trivial by color flow Doppler. +-------------+---------++ MITRAL VALVE           +-------------+---------++ MV Peak grad:9.5 mmHg  +-------------+---------++ MV Mean grad:3.5 mmHg  +-------------+---------++ MV Vmax:     1.54 m/s  +-------------+---------++ MV Vmean:    75.8 cm/s +-------------+---------++ MV VTI:      0.28 m    +-------------+---------++  Roderic Palau MD Electronically signed by Roderic Palau MD Signature Date/Time: 09/28/2019/3:08:52 PM    Final    ECHOCARDIOGRAM LIMITED  Result Date: 09/29/2019    ECHOCARDIOGRAM LIMITED REPORT   Patient Name:   Raymond Ray Date of Exam: 09/29/2019 Medical Rec #:  [id number]           Height:       75.0 in Accession #:    [id number]          Weight:       184.3 lb Date of Birth:  Sep 24, 1950            BSA:          2.119 m Patient Age:    43 years            BP:           97/65 mmHg Patient Gender: M                   HR:           80 bpm. Exam Location:  Inpatient Procedure: 2D Echo Indications:    mitral valve disorder  History:        Patient has prior history of Echocardiogram examinations, most                 recent 09/28/2019. Arrythmias:Atrial Fibrillation. MVP. mitral                 valve repair.  Sonographer:    Jannett Celestine RDCS (AE) Referring Phys: [place]  1. Normal LV function; s/p MV repair with mild MS (mean gradient 5 mm Hg; MVA 2.1 cm2) and no MR; moderate biatrial enlargement.  2. Left ventricular ejection fraction, by estimation, is 50 to 55%. The  left ventricle has low normal function. The left ventricle has no regional wall motion abnormalities.  3. Right ventricular systolic function is normal. The right ventricular size is normal.  4. Left atrial size was moderately dilated.  5. Right atrial size was moderately  dilated.  6. The mitral valve has been repaired/replaced. Trivial mitral valve regurgitation. Mild mitral stenosis.  7. The aortic valve is tricuspid. Aortic valve regurgitation is not visualized. No aortic stenosis is present.  8. The inferior vena cava is dilated in size with >50% respiratory variability, suggesting right atrial pressure of 8 mmHg. FINDINGS  Left Ventricle: Left ventricular ejection fraction, by estimation, is 50 to 55%. The left ventricle has low normal function. The left ventricle has no regional wall motion abnormalities. The left ventricular internal cavity size was normal in size. There is no left ventricular hypertrophy. Right Ventricle: The right ventricular size is normal.Right ventricular systolic function is normal. Left Atrium: Left atrial size was moderately dilated. Right Atrium: Right atrial size was moderately dilated. Pericardium: There is no evidence of pericardial effusion. Mitral Valve: The mitral valve has been repaired/replaced. Normal mobility of the mitral valve leaflets. Trivial mitral valve regurgitation. Mild mitral valve stenosis. Tricuspid Valve: The tricuspid valve is normal in structure. Tricuspid valve regurgitation is mild . No evidence of tricuspid stenosis. Aortic Valve: The aortic valve is tricuspid. Aortic valve regurgitation is not visualized. No aortic stenosis is present. Pulmonic Valve: The pulmonic valve was normal in structure. Pulmonic valve regurgitation is trivial. No evidence of pulmonic stenosis. Aorta: The aortic root is normal in size and structure. Venous: The inferior vena cava is dilated in size with greater than 50% respiratory variability, suggesting right atrial pressure of 8 mmHg.  Additional Comments: Normal LV function; s/p MV repair with mild MS (mean gradient 5 mm Hg; MVA 2.1 cm2) and no MR; moderate biatrial enlargement. Kirk Ruths MD Electronically signed by Kirk Ruths MD Signature Date/Time: 09/29/2019/12:41:20 PM    Final      Assessment/Plan: S/P Procedure(s) (LRB): MINIMALLY INVASIVE MITRAL VALVE REPAIR (MVR) USING SIMUFORM 36MM (Right) possible MINIMALLY INVASIVE TRICUSPID VALVE REPAIR (Right) TRANSESOPHAGEAL ECHOCARDIOGRAM (TEE) (N/A) CLIPPING OF ATRIAL APPENDAGE USING ATRICURE CLIP SIZE 45MM (N/A)  1 doing well POD #2 MiMVrepair 2 vpaced wth junctional brady underneath(high 50-low 60's HR)- cont for now 3 BP well controlled  4 sats good on RA 5 some volume overload, adeq UOP on current lasix dosing 20 q 6 IV- BMET is pending 6 mod CT drainage 430 yesterday, no air leak, keep for now, sero-sang 7 expected ABL anemia- slightly lower, not in transfusion threshold- on Trinsicon 8 No fevers, WBC did bump a littleto 15.7- prob inflammatory response, hopefully can get foley out soon 9 CXR stable in appearance, RLL atx 10 cont pulm toilet/rehab  LOS: 2 days    John Giovanni PA-C Pager [phone] 09/30/2019   I have seen and examined the patient and agree with the assessment and plan as outlined.  Overall doing well POD2.  Afib w/ HR 50 under pacer - currently VVI pacing.  Stable BP and breathing comfortably on room air, CXR looks good.  Expected post op acute blood loss anemia, Hgb has drifted down slightly 7.9 - start iron/folate and continue diuresis.  Expected post op volume excess, weight not recorded this morning, UOP adequate and diuresing some.   Continue VVI pacing and hold beta blockers  Mobilize  Diuresis  D/C central line, Foley  Leave chest tubes until output decreases  further  Coumadin  Transfer 4E   Rexene Alberts, MD 09/30/2019 7:55 AM

## 2019-09-30 NOTE — Progress Notes (Signed)
Chaplain engaged in initial visit with Raymond Ray and his wife.  During visit, [place] shared that he is a recently retired [employer] park manager.  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 Mrs. Flaum shared that they are from or spent a significant amount of time in [place] but do consider [place] as home.  They have been in a place of figuring out what God has for them now in retirement. Chaplain offered support and the ministries of presence and listening.    Chaplain will follow-up as needed.

## 2019-09-30 NOTE — Discharge Instructions (Signed)
Discharge Instructions:  1. You may shower, please wash incisions daily with soap and water and keep dry.  If you wish to cover wounds with dressing you may do so but please keep clean and change daily.  No tub baths or swimming until incisions have completely healed.  If your incisions become red or develop any drainage please call our office at [phone]  2. No Driving until cleared by surgeon office and you are no longer using narcotic pain medications  3. Monitor your weight daily.. Please use the same scale and weigh at same time... If you gain 5-10 lbs in 48 hours with associated lower extremity swelling, please contact our office at [phone]  4. Fever of 101.5 for at least 24 hours with no source, please contact our office at [phone]  5. Activity- up as tolerated, please walk at least 3 times per day.  Avoid strenuous activity, no lifting, pushing, or pulling with your arms over 8-10 lbs for a minimum of 6 weeks  6. If any questions or concerns arise, please do not hesitate to contact our office at [phone]  Information on my medicine - Coumadin   (Warfarin)  This medication education was reviewed with me or my healthcare representative as part of my discharge preparation.   Why was Coumadin prescribed for you? Coumadin was prescribed for you because you have a blood clot or a medical condition that can cause an increased risk of forming blood clots. Blood clots can cause serious health problems by blocking the flow of blood to the heart, lung, or brain. Coumadin can prevent harmful blood clots from forming. As a reminder your indication for Coumadin is:   Select from menu  What test will check on my response to Coumadin? While on Coumadin (warfarin) you will need to have an INR test regularly to ensure that your dose is keeping you in the desired range. The INR (international normalized ratio) number is calculated from the result of the laboratory test called  prothrombin time (PT).  If an INR APPOINTMENT HAS NOT ALREADY BEEN MADE FOR YOU please schedule an appointment to have this lab work done by your health care provider within 7 days. Your INR goal is usually a number between:  2 to 3 or your provider may give you a more narrow range like 2-2.5.  Ask your health care provider during an office visit what your goal INR is.  What  do you need to  know  About  COUMADIN? Take Coumadin (warfarin) exactly as prescribed by your healthcare provider about the same time each day.  DO NOT stop taking without talking to the doctor who prescribed the medication.  Stopping without other blood clot prevention medication to take the place of Coumadin may increase your risk of developing a new clot or stroke.  Get refills before you run out.  What do you do if you miss a dose? If you miss a dose, take it as soon as you remember on the same day then continue your regularly scheduled regimen the next day.  Do not take two doses of Coumadin at the same time.  Important Safety Information A possible side effect of Coumadin (Warfarin) is an increased risk of bleeding. You should call your healthcare provider right away if you experience any of the following: ? Bleeding from an injury or your nose that does not stop. ? Unusual colored urine (red or dark brown) or unusual colored stools (red or black). ? Unusual bruising for  unknown reasons. ? A serious fall or if you hit your head (even if there is no bleeding).  Some foods or medicines interact with Coumadin (warfarin) and might alter your response to warfarin. To help avoid this: ? Eat a balanced diet, maintaining a consistent amount of Vitamin K. ? Notify your provider about major diet changes you plan to make. ? Avoid alcohol or limit your intake to 1 drink for women and 2 drinks for men per day. (1 drink is 5 oz. wine, 12 oz. beer, or 1.5 oz. liquor.)  Make sure that ANY health care provider who prescribes  medication for you knows that you are taking Coumadin (warfarin).  Also make sure the healthcare provider who is monitoring your Coumadin knows when you have started a new medication including herbals and non-prescription products.  Coumadin (Warfarin)  Major Drug Interactions  Increased Warfarin Effect Decreased Warfarin Effect  Alcohol (large quantities) Antibiotics (esp. Septra/Bactrim, Flagyl, Cipro) Amiodarone (Cordarone) Aspirin (ASA) Cimetidine (Tagamet) Megestrol (Megace) NSAIDs (ibuprofen, naproxen, etc.) Piroxicam (Feldene) Propafenone (Rythmol SR) Propranolol (Inderal) Isoniazid (INH) Posaconazole (Noxafil) Barbiturates (Phenobarbital) Carbamazepine (Tegretol) Chlordiazepoxide (Librium) Cholestyramine (Questran) Griseofulvin Oral Contraceptives Rifampin Sucralfate (Carafate) Vitamin K   Coumadin (Warfarin) Major Herbal Interactions  Increased Warfarin Effect Decreased Warfarin Effect  Garlic Ginseng Ginkgo biloba Coenzyme Q10 Green tea St. John's wort    Coumadin (Warfarin) FOOD Interactions  Eat a consistent number of servings per week of foods HIGH in Vitamin K (1 serving =  cup)  Collards (cooked, or boiled & drained) Kale (cooked, or boiled & drained) Mustard greens (cooked, or boiled & drained) Parsley *serving size only =  cup Spinach (cooked, or boiled & drained) Swiss chard (cooked, or boiled & drained) Turnip greens (cooked, or boiled & drained)  Eat a consistent number of servings per week of foods MEDIUM-HIGH in Vitamin K (1 serving = 1 cup)  Asparagus (cooked, or boiled & drained) Broccoli (cooked, boiled & drained, or raw & chopped) Brussel sprouts (cooked, or boiled & drained) *serving size only =  cup Lettuce, raw (green leaf, endive, romaine) Spinach, raw Turnip greens, raw & chopped   These websites have more information on Coumadin (warfarin):  FailFactory.se; VeganReport.com.au;

## 2019-09-30 NOTE — Progress Notes (Addendum)
Patient wishes to defer getting weighed and OOB/ambulate until later this morning. Patient states that he had a restless night and with the pain he wishes to wait. Patient also refusing am dose of Lasix and states that he will discuss it with Dr during morning rounds. 6am dose of toradol/tylenol given and additional prn pain meds offered. Will make day nurse aware.

## 2019-09-30 NOTE — Progress Notes (Signed)
      [address]       [place] [address]             [phone]      Resting comfortably this evening  BP 115/68   Pulse 70   Temp 98.3 F (36.8 C) (Oral)   Resp 15   Ht 6\' 3"  (1.905 m)   Wt 83.6 kg   SpO2 99%   BMI 23.04 kg/m   Intake/Output Summary (Last 24 hours) at 09/30/2019 1851 Last data filed at 09/30/2019 1700 Gross per 24 hour  Intake 263.33 ml  Output 2660 ml  Net -2396.67 ml   Good UO despite elevated creatinine- recheck in AM  POD # 2 doing well  Remo Lipps C. Roxan Hockey, MD [facility] [phone]

## 2019-10-01 ENCOUNTER — Inpatient Hospital Stay (HOSPITAL_COMMUNITY): Payer: Medicare Other

## 2019-10-01 LAB — BASIC METABOLIC PANEL
Anion gap: 7 (ref 5–15)
BUN: 28 mg/dL — ABNORMAL HIGH (ref 8–23)
CO2: 26 mmol/L (ref 22–32)
Calcium: 8.1 mg/dL — ABNORMAL LOW (ref 8.9–10.3)
Chloride: 103 mmol/L (ref 98–111)
Creatinine, Ser: 1.3 mg/dL — ABNORMAL HIGH (ref 0.61–1.24)
GFR calc Af Amer: >60 mL/min (ref >60)
GFR calc non Af Amer: 56 mL/min — ABNORMAL LOW (ref >60)
Glucose, Bld: 116 mg/dL — ABNORMAL HIGH (ref 70–99)
Potassium: 4.7 mmol/L (ref 3.5–5.1)
Sodium: 136 mmol/L (ref 135–145)

## 2019-10-01 LAB — CBC
HCT: 24.2 % — ABNORMAL LOW (ref 39.0–52.0)
Hemoglobin: 8.2 g/dL — ABNORMAL LOW (ref 13.0–17.0)
MCH: 35.8 pg — ABNORMAL HIGH (ref 26.0–34.0)
MCHC: 33.9 g/dL (ref 30.0–36.0)
MCV: 105.7 fL — ABNORMAL HIGH (ref 80.0–100.0)
Platelets: 181 10*3/uL (ref 150–400)
RBC: 2.29 MIL/uL — ABNORMAL LOW (ref 4.22–5.81)
RDW: 15.5 % (ref 11.5–15.5)
WBC: 11.5 10*3/uL — ABNORMAL HIGH (ref 4.0–10.5)
nRBC: 0.4 % — ABNORMAL HIGH (ref 0.0–0.2)

## 2019-10-01 LAB — PROTIME-INR
INR: 1.3 — ABNORMAL HIGH (ref 0.8–1.2)
Prothrombin Time: 16 seconds — ABNORMAL HIGH (ref 11.4–15.2)

## 2019-10-01 MED ORDER — LACTULOSE 10 GM/15ML PO SOLN
20.0000 g | Freq: Every day | ORAL | Status: DC | PRN
  Administered 2019-10-02: 20 g via ORAL
  Filled 2019-10-01: qty 30

## 2019-10-01 NOTE — Progress Notes (Addendum)
      [address]       [place] [address]             [phone]       3 Days Post-Op Procedure(s) (LRB): MINIMALLY INVASIVE MITRAL VALVE REPAIR (MVR) USING SIMUFORM 36MM (Right) possible MINIMALLY INVASIVE TRICUSPID VALVE REPAIR (Right) TRANSESOPHAGEAL ECHOCARDIOGRAM (TEE) (N/A) CLIPPING OF ATRIAL APPENDAGE USING ATRICURE CLIP SIZE 45MM (N/A) Subjective: Awake and alert, feels like he is progressing. Having some pain associated with the chest tubes. Tolerating PO's, no BM yet.   Objective: Vital signs in last 24 hours: Temp:  [97.7 F (36.5 C)-99 F (37.2 C)] 98.3 F (36.8 C) (08/07 1104) Pulse Rate:  [69-73] 73 (08/07 1104) Cardiac Rhythm: Ventricular paced (08/07 0815) Resp:  [13-20] 15 (08/07 1104) BP: (100-124)/(60-77) 118/77 (08/07 1104) SpO2:  [97 %-100 %] 100 % (08/07 1104) Weight:  [78.7 kg] 78.7 kg (08/07 0523)     Intake/Output from previous day: 08/06 0701 - 08/07 0700 In: 240 [P.O.:240] Out: 2830 [Urine:2400; Chest Tube:430] Intake/Output this shift: Total I/O In: -  Out: 70 [Chest Tube:70]  General appearance: alert, cooperative and mild distress Neurologic: intact Heart: Paced VVI with good capture. Intrinsic rhythm is a-fib with VR 58-65.  Lungs: Breath sounds are CTA. CT drainage 429ml past 24 hours.  Abdomen: soft, flat, NT Extremities: No peripheral edema. The right groin cannulation sites are soft, dry. Wound: the right chest dressing is covered with a dry Aquacel dressing.   Lab Results: Recent Labs    09/30/19 0608 10/01/19 0238  WBC 15.7* 11.5*  HGB 7.9* 8.2*  HCT 24.0* 24.2*  PLT 161 181   BMET:  Recent Labs    09/30/19 0608 10/01/19 0238  NA 138 136  K 4.5 4.7  CL 106 103  CO2 23 26  GLUCOSE 122* 116*  BUN 29* 28*  CREATININE 1.49* 1.30*  CALCIUM 8.1* 8.1*    PT/INR:  Recent Labs    10/01/19 0238  LABPROT 16.0*  INR 1.3*   ABG    Component Value Date/Time   PHART 7.310 (L) 09/28/2019 1450     HCO3 23.1 09/28/2019 1450   TCO2 25 09/28/2019 1450   ACIDBASEDEF 3.0 (H) 09/28/2019 1450   O2SAT 96.0 09/28/2019 1450   CBG (last 3)  Recent Labs    09/29/19 1516 09/29/19 2008 09/29/19 2341  GLUCAP 142* 135* 103*    Assessment/Plan: S/P Procedure(s) (LRB): MINIMALLY INVASIVE MITRAL VALVE REPAIR (MVR) USING SIMUFORM 36MM (Right) possible MINIMALLY INVASIVE TRICUSPID VALVE REPAIR (Right) TRANSESOPHAGEAL ECHOCARDIOGRAM (TEE) (N/A) CLIPPING OF ATRIAL APPENDAGE USING ATRICURE CLIP SIZE 45MM (N/A)  -POD3 Mini MV repair, making satisfactory progress. Advance activity today. Leave the chest tubes for drainage. Coumadin has been dosed at 2.5mg  daily, INR 1.3. Keep INR relatively low until pacer wires removed.   -Chronic atrial fibrillation- he has had slow VR since surgery but is improving. Will continue ventricular pacing at 70 for another day.   -Expected acute blood loss anemia- Hct is trending up. Continue Trinsicon. Monitor.   -Acute renal insufficiency- Creat trending down and having good response to diuretic. Monitor.   -DVT PPX-continue Lovenox.    LOS: 3 days    Malon Kindle [phone] 10/01/2019 Patient seen and examined, agree with above Overall doing well Keep CT for now  [place]. Roxan Hockey, MD [facility] [phone]

## 2019-10-01 NOTE — Progress Notes (Signed)
Patient transferred from 2 Heart to [place] 06,alert and oriented X4, attached to continous cardiac monitoring,CCMD notified.Patient oriented to the room.will continue to monitor.

## 2019-10-01 NOTE — Progress Notes (Signed)
Mobility Specialist - Progress Note   10/01/19 1204  Mobility  Activity Ambulated in hall  Level of Assistance Modified independent, requires aide device or extra time  Assistive Device Front wheel walker  Distance Ambulated (ft) 330 ft  Mobility Response Tolerated well  Mobility performed by Mobility specialist  $Mobility charge 1 Mobility    Pre-mobility: 71 HR, 98% SpO2 During mobility: 70 HR, 98% SpO2 Post-mobility: 73 HR, 98% SpO2   Raymond Ray Transport planner Phone: [phone]

## 2019-10-02 LAB — BASIC METABOLIC PANEL
Anion gap: 8 (ref 5–15)
BUN: 24 mg/dL — ABNORMAL HIGH (ref 8–23)
CO2: 30 mmol/L (ref 22–32)
Calcium: 8.5 mg/dL — ABNORMAL LOW (ref 8.9–10.3)
Chloride: 99 mmol/L (ref 98–111)
Creatinine, Ser: 1.31 mg/dL — ABNORMAL HIGH (ref 0.61–1.24)
GFR calc Af Amer: >60 mL/min (ref >60)
GFR calc non Af Amer: 56 mL/min — ABNORMAL LOW (ref >60)
Glucose, Bld: 110 mg/dL — ABNORMAL HIGH (ref 70–99)
Potassium: 4.7 mmol/L (ref 3.5–5.1)
Sodium: 137 mmol/L (ref 135–145)

## 2019-10-02 LAB — CBC
HCT: 24.2 % — ABNORMAL LOW (ref 39.0–52.0)
Hemoglobin: 8 g/dL — ABNORMAL LOW (ref 13.0–17.0)
MCH: 34.6 pg — ABNORMAL HIGH (ref 26.0–34.0)
MCHC: 33.1 g/dL (ref 30.0–36.0)
MCV: 104.8 fL — ABNORMAL HIGH (ref 80.0–100.0)
Platelets: 219 10*3/uL (ref 150–400)
RBC: 2.31 MIL/uL — ABNORMAL LOW (ref 4.22–5.81)
RDW: 15.3 % (ref 11.5–15.5)
WBC: 8.8 10*3/uL (ref 4.0–10.5)
nRBC: 1 % — ABNORMAL HIGH (ref 0.0–0.2)

## 2019-10-02 LAB — PROTIME-INR
INR: 1.3 — ABNORMAL HIGH (ref 0.8–1.2)
Prothrombin Time: 15.4 seconds — ABNORMAL HIGH (ref 11.4–15.2)

## 2019-10-02 MED ORDER — METOCLOPRAMIDE HCL 5 MG/ML IJ SOLN
10.0000 mg | Freq: Four times a day (QID) | INTRAMUSCULAR | Status: AC
  Administered 2019-10-02 (×3): 10 mg via INTRAVENOUS
  Filled 2019-10-02 (×3): qty 2

## 2019-10-02 MED ORDER — PROMETHAZINE HCL 25 MG/ML IJ SOLN: 12.5000 mg | Freq: Three times a day (TID) | INTRAMUSCULAR | Status: DC | PRN

## 2019-10-02 MED ORDER — WARFARIN SODIUM 4 MG PO TABS
4.0000 mg | ORAL_TABLET | Freq: Every day | ORAL | Status: DC
  Administered 2019-10-02: 4 mg via ORAL
  Filled 2019-10-02: qty 1

## 2019-10-02 NOTE — Progress Notes (Addendum)
[address]       [place], [address]             [phone]        4 Days Post-Op Procedure(s) (LRB): MINIMALLY INVASIVE MITRAL VALVE REPAIR (MVR) USING SIMUFORM 36MM (Right) possible MINIMALLY INVASIVE TRICUSPID VALVE REPAIR (Right) TRANSESOPHAGEAL ECHOCARDIOGRAM (TEE) (N/A) CLIPPING OF ATRIAL APPENDAGE USING ATRICURE CLIP SIZE 45MM (N/A) Subjective: Not feeling as well today.  Had an episode of nausea with vomiting x1 this morning.  Denies abdominal pain.  Making progress with mobility.  Objective: Vital signs in last 24 hours: Temp:  [97.6 F (36.4 C)-98.7 F (37.1 C)] 98.3 F (36.8 C) (08/08 1111) Pulse Rate:  [69-70] 70 (08/08 1111) Cardiac Rhythm: Ventricular paced (08/08 0746) Resp:  [14-20] 19 (08/08 1111) BP: (105-126)/(64-74) 118/74 (08/08 1111) SpO2:  [97 %-99 %] 97 % (08/08 1111) Weight:  [77.4 kg] 77.4 kg (08/08 0401)  Hemodynamic parameters for last 24 hours:    Intake/Output from previous day: 08/07 0701 - 08/08 0700 In: 680 [P.O.:680] Out: 2044 [Urine:1885; Chest Tube:159] Intake/Output this shift: No intake/output data recorded.  General appearance: alert, cooperative and mild distress Neurologic: intact Heart: Paced VVI with good capture. Intrinsic rhythm is a-fib with VR in the 60's. Pacer turned off.   Lungs: Breath sounds are CTA. CT drainage 258ml past 24 hours.  Abdomen: soft, flat, NT Extremities: No peripheral edema. The right groin cannulation sites are soft, dry. Wound: the right chest dressing is covered with a dry Aquacel dressing.   Lab Results: Recent Labs    10/01/19 0238 10/02/19 0428  WBC 11.5* 8.8  HGB 8.2* 8.0*  HCT 24.2* 24.2*  PLT 181 219   BMET:  Recent Labs    10/01/19 0238 10/02/19 0428  NA 136 137  K 4.7 4.7  CL 103 99  CO2 26 30  GLUCOSE 116* 110*  BUN 28* 24*  CREATININE 1.30* 1.31*  CALCIUM 8.1* 8.5*    PT/INR:  Recent Labs    10/02/19 0428  LABPROT 15.4*  INR 1.3*    ABG    Component Value Date/Time   PHART 7.310 (L) 09/28/2019 1450   HCO3 23.1 09/28/2019 1450   TCO2 25 09/28/2019 1450   ACIDBASEDEF 3.0 (H) 09/28/2019 1450   O2SAT 96.0 09/28/2019 1450   CBG (last 3)  Recent Labs    09/29/19 1516 09/29/19 2008 09/29/19 2341  GLUCAP 142* 135* 103*    Assessment/Plan: S/P Procedure(s) (LRB): MINIMALLY INVASIVE MITRAL VALVE REPAIR (MVR) USING SIMUFORM 36MM (Right) possible MINIMALLY INVASIVE TRICUSPID VALVE REPAIR (Right) TRANSESOPHAGEAL ECHOCARDIOGRAM (TEE) (N/A) CLIPPING OF ATRIAL APPENDAGE USING ATRICURE CLIP SIZE 45MM (N/A)  -POD4 Mini MV repair, making satisfactory progress. Advance activity today. Leave the chest tubes for drainage (200 mL past 24 hours by my measurement). Coumadin has been dosed at 2.5mg  daily, INR 1.3 past 2 days. Increase Coumadin to 4mg  tonight.    -Nausea-vomiting x1.  No abdominal pain.  The abdominal exam is benign.  Will treat symptomatically, add Reglan 10 mg IV every 6 hours x3 doses.  -Chronic atrial fibrillation- he has had slow VR since surgery but is improving. Pacer turned off, A-fib with VR in 60's today.  Plan to remove the ventricular pacer wire tomorrow if rhythm remains stable.  -Expected acute blood loss anemia- Hct is stable. Continue Trinsicon. Monitor.   -Acute renal insufficiency- Creat stable and having good response to diuretic. Monitor.   -DVT PPX-continue Lovenox.  LOS: 4 days    Antony Odea, [place] [phone] 10/02/2019 Patient seen and examined, agree with above  Remo Lipps C. Roxan Hockey, MD [facility] [phone]

## 2019-10-02 NOTE — Progress Notes (Signed)
Mobility Specialist - Progress Note   10/02/19 1225  Mobility  Activity Ambulated in hall  Level of Assistance Modified independent, requires aide device or extra time  Assistive Device Front wheel walker  Distance Ambulated (ft) 330 ft  Mobility Response Tolerated well  Mobility performed by Mobility specialist  $Mobility charge 1 Mobility    Pre-mobility: 68 HR, 98% SpO2 During mobility: 85 HR Post-mobility: 65 HR, 93% SpO2   Hayes Czaja Transport planner Phone: [phone]

## 2019-10-03 LAB — CBC
HCT: 24.6 % — ABNORMAL LOW (ref 39.0–52.0)
Hemoglobin: 8.2 g/dL — ABNORMAL LOW (ref 13.0–17.0)
MCH: 34.6 pg — ABNORMAL HIGH (ref 26.0–34.0)
MCHC: 33.3 g/dL (ref 30.0–36.0)
MCV: 103.8 fL — ABNORMAL HIGH (ref 80.0–100.0)
Platelets: 248 10*3/uL (ref 150–400)
RBC: 2.37 MIL/uL — ABNORMAL LOW (ref 4.22–5.81)
RDW: 15.4 % (ref 11.5–15.5)
WBC: 8.1 10*3/uL (ref 4.0–10.5)
nRBC: 1.1 % — ABNORMAL HIGH (ref 0.0–0.2)

## 2019-10-03 LAB — BASIC METABOLIC PANEL
Anion gap: 9 (ref 5–15)
BUN: 25 mg/dL — ABNORMAL HIGH (ref 8–23)
CO2: 32 mmol/L (ref 22–32)
Calcium: 8.6 mg/dL — ABNORMAL LOW (ref 8.9–10.3)
Chloride: 96 mmol/L — ABNORMAL LOW (ref 98–111)
Creatinine, Ser: 1.39 mg/dL — ABNORMAL HIGH (ref 0.61–1.24)
GFR calc Af Amer: 60 mL/min — ABNORMAL LOW (ref >60)
GFR calc non Af Amer: 52 mL/min — ABNORMAL LOW (ref >60)
Glucose, Bld: 125 mg/dL — ABNORMAL HIGH (ref 70–99)
Potassium: 4 mmol/L (ref 3.5–5.1)
Sodium: 137 mmol/L (ref 135–145)

## 2019-10-03 LAB — PROTIME-INR
INR: 1.2 (ref 0.8–1.2)
Prothrombin Time: 14.9 seconds (ref 11.4–15.2)

## 2019-10-03 MED ORDER — WARFARIN SODIUM 5 MG PO TABS
6.0000 mg | ORAL_TABLET | Freq: Every day | ORAL | Status: DC
  Administered 2019-10-03: 6 mg via ORAL
  Filled 2019-10-03: qty 1

## 2019-10-03 NOTE — Progress Notes (Signed)
Mobility Specialist - Progress Note   10/03/19 1336  Mobility  Activity Ambulated in hall  Level of Assistance Modified independent, requires aide device or extra time  Assistive Device Front wheel walker  Distance Ambulated (ft) 730 ft  Mobility Response Tolerated well  Mobility performed by Mobility specialist  $Mobility charge 1 Mobility   During mobility: 85 HR Post-mobility: 80 HR  Pt seen ambulating in hallway, so I accompanied him for supervision. He says this was his third walk today and he has been feeling well, but has noticed it is difficult for him to keep his eyes open while reading. Pt states he feels more groggy while laying down, but gets more alert while ambulating. I was unable to get any signal w/ my pulse oximeter.   Pricilla Handler Mobility Specialist Mobility Specialist Phone: [phone]

## 2019-10-03 NOTE — Progress Notes (Signed)
CARDIAC REHAB PHASE I   PRE:  Rate/Rhythm: 69 afib  BP:  Supine: 102/59  Sitting:   Standing:    SaO2: 96%RA  MODE:  Ambulation: 670 ft   POST:  Rate/Rhythm: 91 afib  BP:  Supine: 152/73  Sitting:   Standing:    SaO2: 100%RA [id number] Pt walked 670 ft on RA with rolling walker and minimal asst with steady gait and tolerated well. To bathroom for BM after walk. Assisted pt with toileting and then to bed for chest tube removal. Encouraged IS. This was second walk today. Pt plans to get at least three maybe four walks today.Sats good on RA.   Graylon Good, RN BSN  10/03/2019 9:01 AM

## 2019-10-03 NOTE — Progress Notes (Addendum)
[address]       [place] [address]             [phone]      5 Days Post-Op Procedure(s) (LRB): MINIMALLY INVASIVE MITRAL VALVE REPAIR (MVR) USING SIMUFORM 36MM (Right) possible MINIMALLY INVASIVE TRICUSPID VALVE REPAIR (Right) TRANSESOPHAGEAL ECHOCARDIOGRAM (TEE) (N/A) CLIPPING OF ATRIAL APPENDAGE USING ATRICURE CLIP SIZE 45MM (N/A) Subjective: Feels "groggy", no longer nauseated, feels like he is getting stronger  Objective: Vital signs in last 24 hours: Temp:  [98.2 F (36.8 C)-98.8 F (37.1 C)] 98.4 F (36.9 C) (08/09 0324) Pulse Rate:  [62-70] 63 (08/09 0324) Cardiac Rhythm: Atrial fibrillation (08/08 1902) Resp:  [13-20] 16 (08/09 0405) BP: (99-118)/(65-74) 113/67 (08/09 0324) SpO2:  [96 %-98 %] 96 % (08/09 0324) Weight:  [74.4 kg] 74.4 kg (08/09 0325)  Hemodynamic parameters for last 24 hours:    Intake/Output from previous day: 08/08 0701 - 08/09 0700 In: -  Out: 955 [Urine:725; Chest Tube:230] Intake/Output this shift: No intake/output data recorded.  General appearance: alert, cooperative and no distress Heart: irregularly irregular rhythm Lungs: clear to auscultation bilaterally Abdomen: benign Extremities: no edema Wound: incis healing well, mod echymosis  Lab Results: Recent Labs    10/02/19 0428 10/03/19 0216  WBC 8.8 8.1  HGB 8.0* 8.2*  HCT 24.2* 24.6*  PLT 219 248   BMET:  Recent Labs    10/02/19 0428 10/03/19 0216  NA 137 137  K 4.7 4.0  CL 99 96*  CO2 30 32  GLUCOSE 110* 125*  BUN 24* 25*  CREATININE 1.31* 1.39*  CALCIUM 8.5* 8.6*    PT/INR:  Recent Labs    10/03/19 0216  LABPROT 14.9  INR 1.2   ABG    Component Value Date/Time   PHART 7.310 (L) 09/28/2019 1450   HCO3 23.1 09/28/2019 1450   TCO2 25 09/28/2019 1450   ACIDBASEDEF 3.0 (H) 09/28/2019 1450   O2SAT 96.0 09/28/2019 1450   CBG (last 3)  No results for input(s): GLUCAP in the last 72 hours.  Meds Scheduled Meds: .  acetaminophen (TYLENOL) oral liquid 160 mg/5 mL  650 mg Per Tube Once  . acetaminophen  1,000 mg Oral Q6H  . aspirin EC  81 mg Oral Daily  . bisacodyl  10 mg Oral Daily   Or  . bisacodyl  10 mg Rectal Daily  . Chlorhexidine Gluconate Cloth  6 each Topical Daily  . [facility], Patient & Family Education   Does not apply Once  . docusate sodium  200 mg Oral Daily  . enoxaparin (LOVENOX) injection  30 mg Subcutaneous QHS  . ferrous [id number] C-folic acid  1 capsule Oral Q breakfast  . furosemide  40 mg Oral BID  . mouth rinse  15 mL Mouth Rinse BID  . pantoprazole  40 mg Oral Daily  . potassium chloride  20 mEq Oral BID  . sodium chloride flush  10-40 mL Intracatheter Q12H  . sodium chloride flush  3 mL Intravenous Q12H  . warfarin  4 mg Oral q1600  . Warfarin - Physician Dosing Inpatient   Does not apply q1600   Continuous Infusions: . sodium chloride    . lactated ringers     PRN Meds:.lactulose, metoprolol tartrate, morphine injection, ondansetron (ZOFRAN) IV, oxyCODONE, promethazine, sodium chloride flush, sodium chloride flush, traMADol, zolpidem  Xrays DG Chest Port 1 View  Result Date: 10/01/2019 CLINICAL DATA:  Right apical pneumothorax EXAM: PORTABLE  CHEST 1 VIEW COMPARISON:  09/30/2019 FINDINGS: Right chest tube with tiny right apical pneumothorax, unchanged. Left lung is essentially clear. No pleural effusions. Mild cardiomegaly.  Prosthetic mitral valve. IMPRESSION: Right chest tube with tiny right apical pneumothorax, unchanged. Electronically Signed   By: Julian Hy M.D.   On: 10/01/2019 10:02    Assessment/Plan: S/P Procedure(s) (LRB): MINIMALLY INVASIVE MITRAL VALVE REPAIR (MVR) USING SIMUFORM 36MM (Right) possible MINIMALLY INVASIVE TRICUSPID VALVE REPAIR (Right) TRANSESOPHAGEAL ECHOCARDIOGRAM (TEE) (N/A) CLIPPING OF ATRIAL APPENDAGE USING ATRICURE CLIP SIZE 45MM (N/A)    Doing well POD#5  1 stable hemodynamics in afib with CVR,  will d/c epw's today 2 sats good on RA 3 INR remains subtherapeudic, will go to 6 mg today 4 CT's 230 cc/24 hours- can prob d/c today 5 adequate UOP- Creat slightly higher, will stop lasix for now as appears euvolemic 6 expected ABL anemia is stable, cont Trinsicon 7 cont to push rehab/pulm toilet/diet 8 poss home 1-2 days   LOS: 5 days    John Giovanni PA-C Pager [phone] 10/03/2019   I have seen and examined the patient and agree with the assessment and plan as outlined.  D/C pacing wires and chest tubes.  Mobilize.  Possible d/c home 1-2 days.  Rexene Alberts, MD 10/03/2019 8:22 AM

## 2019-10-03 NOTE — Progress Notes (Signed)
Mobility Specialist - Progress Note   10/03/19 1529  Mobility  Activity Ambulated in hall  Level of Assistance Modified independent, requires aide device or extra time  Assistive Device Front wheel walker  Distance Ambulated (ft) 1300 ft  Mobility Response Tolerated well  Mobility performed by Mobility specialist  $Mobility charge 1 Mobility    Pre-mobility: 77 HR During mobility: 91 HR Post-mobility: 70 HR  Pt is happy to continue ambulating and says this was his 4th walk today, he has set a personal objective of 5 walks today.  Pricilla Handler Mobility Specialist Mobility Specialist Phone: [phone]

## 2019-10-03 NOTE — Progress Notes (Signed)
Epicardial pacing wire removed without difficulty at 1102 am.  Site clean, dry, intact.  Patient in bed at 30 degrees for 1 hour.  Vital sign monitoring Q15 mins for 1 hour.

## 2019-10-03 NOTE — Progress Notes (Signed)
Pt. Chest tube removed preorder. BP 108/63 HR68 RR22. pt tolerated well  denies shortness of breath. Will continue to monitor   Isabell Jarvis, RN

## 2019-10-04 ENCOUNTER — Inpatient Hospital Stay (HOSPITAL_COMMUNITY): Payer: Medicare Other

## 2019-10-04 LAB — PROTIME-INR
INR: 1.4 — ABNORMAL HIGH (ref 0.8–1.2)
Prothrombin Time: 16.5 seconds — ABNORMAL HIGH (ref 11.4–15.2)

## 2019-10-04 MED ORDER — FE FUMARATE-B12-VIT C-FA-IFC PO CAPS: 1.0000 | ORAL_CAPSULE | Freq: Every day | ORAL | 1 refills | Status: DC

## 2019-10-04 MED ORDER — METOPROLOL TARTRATE 25 MG PO TABS: 12.5000 mg | ORAL_TABLET | Freq: Two times a day (BID) | ORAL | 1 refills | Status: DC

## 2019-10-04 MED ORDER — TRAMADOL HCL 50 MG PO TABS: 50.0000 mg | ORAL_TABLET | Freq: Four times a day (QID) | ORAL | 0 refills | Status: DC | PRN

## 2019-10-04 MED ORDER — OXYCODONE HCL 5 MG PO TABS: 5.0000 mg | ORAL_TABLET | Freq: Four times a day (QID) | ORAL | 0 refills | Status: DC | PRN

## 2019-10-04 MED ORDER — WARFARIN SODIUM 5 MG PO TABS: 5.0000 mg | ORAL_TABLET | Freq: Every day | ORAL | 1 refills | Status: DC

## 2019-10-04 MED ORDER — ASPIRIN 81 MG PO TBEC: 81.0000 mg | DELAYED_RELEASE_TABLET | Freq: Every day | ORAL | Status: DC

## 2019-10-04 MED ORDER — METOPROLOL TARTRATE 12.5 MG HALF TABLET
12.5000 mg | ORAL_TABLET | Freq: Two times a day (BID) | ORAL | Status: DC
  Administered 2019-10-04: 12.5 mg via ORAL
  Filled 2019-10-04: qty 1

## 2019-10-04 NOTE — Progress Notes (Signed)
[id number] Pt has headache and would like to rest right now so did not walk. Pt walked 5 times yesterday. Pt does not think he needs walker for home use. Wife present for ed. Reviewed wound care, IS, restrictions, walking for ex, gave heart healthy diet, and discussed CRP 2. Referred to [facility] program. Understanding voiced of ed by pt and wife. Graylon Good RN BSN 10/04/2019 9:47 AM

## 2019-10-04 NOTE — Progress Notes (Signed)
Patient had a 14 beat run of vtach overnight. Patient was in bed sleeping at the time. No signs of distress.

## 2019-10-04 NOTE — Progress Notes (Addendum)
[address]       [place] [address]             [phone]      6 Days Post-Op Procedure(s) (LRB): MINIMALLY INVASIVE MITRAL VALVE REPAIR (MVR) USING SIMUFORM 36MM (Right) possible MINIMALLY INVASIVE TRICUSPID VALVE REPAIR (Right) TRANSESOPHAGEAL ECHOCARDIOGRAM (TEE) (N/A) CLIPPING OF ATRIAL APPENDAGE USING ATRICURE CLIP SIZE 45MM (N/A) Subjective: Feels pretty well overall, heart beat awareness , ?palpitations made it a little difficult to sleep Had short burst of WCT, ow afib with CVR  Objective: Vital signs in last 24 hours: Temp:  [98.1 F (36.7 C)-99 F (37.2 C)] 98.6 F (37 C) (08/10 0331) Pulse Rate:  [61-75] 75 (08/10 0331) Cardiac Rhythm: Atrial fibrillation (08/09 1950) Resp:  [12-20] 19 (08/10 0552) BP: (93-122)/(52-75) 93/52 (08/10 0331) SpO2:  [97 %-100 %] 97 % (08/10 0331) Weight:  [74.6 kg] 74.6 kg (08/10 0332)  Hemodynamic parameters for last 24 hours:    Intake/Output from previous day: 08/09 0701 - 08/10 0700 In: 480 [P.O.:480] Out: 200 [Urine:200] Intake/Output this shift: No intake/output data recorded.  General appearance: alert, cooperative and no distress Heart: irregularly irregular rhythm Lungs: clear to auscultation bilaterally Abdomen: benign Extremities: no edema Wound: incis healing well, echymosis is stable  Lab Results: Recent Labs    10/02/19 0428 10/03/19 0216  WBC 8.8 8.1  HGB 8.0* 8.2*  HCT 24.2* 24.6*  PLT 219 248   BMET:  Recent Labs    10/02/19 0428 10/03/19 0216  NA 137 137  K 4.7 4.0  CL 99 96*  CO2 30 32  GLUCOSE 110* 125*  BUN 24* 25*  CREATININE 1.31* 1.39*  CALCIUM 8.5* 8.6*    PT/INR:  Recent Labs    10/04/19 0128  LABPROT 16.5*  INR 1.4*   ABG    Component Value Date/Time   PHART 7.310 (L) 09/28/2019 1450   HCO3 23.1 09/28/2019 1450   TCO2 25 09/28/2019 1450   ACIDBASEDEF 3.0 (H) 09/28/2019 1450   O2SAT 96.0 09/28/2019 1450   CBG (last 3)  No results for  input(s): GLUCAP in the last 72 hours.  Meds Scheduled Meds: . acetaminophen (TYLENOL) oral liquid 160 mg/5 mL  650 mg Per Tube Once  . aspirin EC  81 mg Oral Daily  . bisacodyl  10 mg Oral Daily   Or  . bisacodyl  10 mg Rectal Daily  . Chlorhexidine Gluconate Cloth  6 each Topical Daily  . [facility], Patient & Family Education   Does not apply Once  . docusate sodium  200 mg Oral Daily  . enoxaparin (LOVENOX) injection  30 mg Subcutaneous QHS  . ferrous [id number] C-folic acid  1 capsule Oral Q breakfast  . mouth rinse  15 mL Mouth Rinse BID  . pantoprazole  40 mg Oral Daily  . sodium chloride flush  10-40 mL Intracatheter Q12H  . sodium chloride flush  3 mL Intravenous Q12H  . warfarin  6 mg Oral q1600  . Warfarin - Physician Dosing Inpatient   Does not apply q1600   Continuous Infusions: . sodium chloride    . lactated ringers     PRN Meds:.lactulose, metoprolol tartrate, morphine injection, ondansetron (ZOFRAN) IV, oxyCODONE, promethazine, sodium chloride flush, sodium chloride flush, traMADol, zolpidem  Xrays No results found.  Assessment/Plan: S/P Procedure(s) (LRB): MINIMALLY INVASIVE MITRAL VALVE REPAIR (MVR) USING SIMUFORM 36MM (Right) possible MINIMALLY INVASIVE TRICUSPID VALVE REPAIR (Right) TRANSESOPHAGEAL ECHOCARDIOGRAM (TEE) (N/A)  CLIPPING OF ATRIAL APPENDAGE USING ATRICURE CLIP SIZE 45MM (N/A)  POD#6 conts to make excellent progress  1 hemodyn stable in afib with CVR, one short episode of WCT, will discuss restarting low dose metoprolol with surgeon 2 sats good on RA 3 INR 1.4 - will d/c on 5 mg and arrange coumadin clinic f/u 4 no other new labs 5 prob d/c today  LOS: 6 days    Raymond Giovanni PA-C Pager [phone] 10/04/2019   I have seen and examined the patient and agree with the assessment and plan as outlined.  Renal function stable with post op mildly elevated serum creatinine, likely due to prerenal azotemia +/-  acute kidney injury caused by ATN, UOP adequate and tolerating adequate po's.  D/C home today.  Instruction given.   Rexene Alberts, MD 10/04/2019 12:00 PM

## 2019-10-04 NOTE — Care Management Important Message (Signed)
Important Message  Patient Details  Name: Raymond Ray MRN: [id number] Date of Birth: [birth year]   Medicare Important Message Given:  Yes     [name] 10/04/2019, 10:38 AM

## 2019-10-05 LAB — BPAM RBC
Blood Product Expiration Date: 202108292359
Blood Product Expiration Date: 202108292359
ISSUE DATE / TIME: 202108040947
ISSUE DATE / TIME: 202108040947
Unit Type and Rh: 5100
Unit Type and Rh: 5100

## 2019-10-05 LAB — TYPE AND SCREEN
ABO/RH(D): O POS
Antibody Screen: NEGATIVE
Unit division: 0
Unit division: 0

## 2019-10-07 ENCOUNTER — Telehealth (HOSPITAL_COMMUNITY): Payer: Self-pay

## 2019-10-07 NOTE — Telephone Encounter (Signed)
Called patient to see if he is interested in the Cardiac Rehab Program. Patient expressed interest. Explained scheduling process and went over insurance, patient verbalized understanding. Will contact patient for scheduling once f/u has been completed.  °

## 2019-10-07 NOTE — Telephone Encounter (Signed)
Pt insurance is active and benefits verified through Medicare A/B. Co-pay $0.00, DED $203.00/$203.00 met, out of pocket $0.00/$0.00 met, co-insurance 20%. No pre-authorization required. Passport, 10/07/19 @ 10:51AM, UZR#[id number]  2ndary insurance is active and benefits verified through [employer]. Co-pay $0.00, DED $0.00/$0.00 met, out of pocket $0.00/$0.00 met, co-insurance 0%. No pre-authorization required.   Will contact patient to see if he is interested in the Cardiac Rehab Program. If interested, patient will need to complete follow up appt. Once completed, patient will be contacted for scheduling upon review by the RN Navigator.

## 2019-10-10 ENCOUNTER — Other Ambulatory Visit: Payer: Self-pay | Admitting: Thoracic Surgery (Cardiothoracic Vascular Surgery)

## 2019-10-10 ENCOUNTER — Other Ambulatory Visit: Payer: Self-pay

## 2019-10-10 ENCOUNTER — Ambulatory Visit (INDEPENDENT_AMBULATORY_CARE_PROVIDER_SITE_OTHER): Payer: Self-pay | Admitting: Physician Assistant

## 2019-10-10 ENCOUNTER — Ambulatory Visit
Admission: RE | Admit: 2019-10-10 | Discharge: 2019-10-10 | Disposition: A | Discharge status: Home / Self Care | Payer: Medicare Other | Source: Ambulatory Visit | Attending: Thoracic Surgery (Cardiothoracic Vascular Surgery) | Admitting: Thoracic Surgery (Cardiothoracic Vascular Surgery)

## 2019-10-10 ENCOUNTER — Ambulatory Visit (INDEPENDENT_AMBULATORY_CARE_PROVIDER_SITE_OTHER): Payer: Medicare Other | Admitting: Pharmacist

## 2019-10-10 VITALS — BP 109/70 | HR 69 | Temp 97.7°F | Resp 20 | Ht 75.0 in | Wt 172.0 lb

## 2019-10-10 DIAGNOSIS — J939 Pneumothorax, unspecified: Secondary | ICD-10-CM | POA: Diagnosis not present

## 2019-10-10 DIAGNOSIS — I4821 Permanent atrial fibrillation: Secondary | ICD-10-CM | POA: Diagnosis not present

## 2019-10-10 DIAGNOSIS — J9811 Atelectasis: Secondary | ICD-10-CM | POA: Diagnosis not present

## 2019-10-10 DIAGNOSIS — I340 Nonrheumatic mitral (valve) insufficiency: Secondary | ICD-10-CM

## 2019-10-10 DIAGNOSIS — I4819 Other persistent atrial fibrillation: Secondary | ICD-10-CM

## 2019-10-10 DIAGNOSIS — Z9889 Other specified postprocedural states: Secondary | ICD-10-CM | POA: Diagnosis not present

## 2019-10-10 DIAGNOSIS — J90 Pleural effusion, not elsewhere classified: Secondary | ICD-10-CM | POA: Diagnosis not present

## 2019-10-10 DIAGNOSIS — Z7901 Long term (current) use of anticoagulants: Secondary | ICD-10-CM | POA: Diagnosis not present

## 2019-10-10 LAB — POCT INR: INR: 1.7 — AB (ref 2.0–3.0)

## 2019-10-10 NOTE — Patient Instructions (Addendum)
Description   Take 2 tablets today and then continue taking 1 tablet every day.  Please call with any questions [phone]

## 2019-10-10 NOTE — Progress Notes (Signed)
HPI:  Patient returns for routine postoperative follow-up having undergone Minimally Invasive Mitral Valve Repair on 09/28/2019. The patient's early postoperative recovery while in the hospital was notable for post operative junctional rhythm.  This recovered without intervention. Since hospital discharge the patient reports overall he is doing better.  He is walking 4 times per day, but notices if he walks up even the slightest incline it is hard to do.  He states his incisions are healing without evidence of infection.  He denies lower extremity edema.  He does ask about taking ABX prior to dental procedures and he was instructed to continue this.  He has not yet had his coumadin level checked.  Current Outpatient Medications  Medication Sig Dispense Refill  . ascorbic acid (VITAMIN C) 500 MG tablet Take 500 mg by mouth daily.     Marland Kitchen aspirin EC 81 MG EC tablet Take 1 tablet (81 mg total) by mouth daily. Swallow whole.    . ferrous [id number] C-folic acid (TRINSICON / FOLTRIN) capsule Take 1 capsule by mouth daily with breakfast. 30 capsule 1  . hydrocortisone cream 1 % Apply 1 application topically daily.     . metoprolol tartrate (LOPRESSOR) 25 MG tablet Take 0.5 tablets (12.5 mg total) by mouth 2 (two) times daily. 30 tablet 1  . Multiple Vitamins-Minerals (MULTIVITAMIN WITH MINERALS) tablet Take 1 tablet by mouth daily. [facility] men     . polyethylene glycol (MIRALAX / GLYCOLAX) 17 g packet Take 17 g by mouth daily.    . traMADol (ULTRAM) 50 MG tablet Take 1 tablet (50 mg total) by mouth every 6 (six) hours as needed for moderate pain. 28 tablet 0  . warfarin (COUMADIN) 5 MG tablet Take 1 tablet (5 mg total) by mouth daily at 4 PM. As directed by coumadin clinic 100 tablet 1  . zolpidem (AMBIEN CR) 12.5 MG CR tablet Take 12.5 mg by mouth at bedtime.      No current facility-administered medications for this visit.   Physical Exam:  BP 109/70 (BP Location: Left Arm, Patient Position:  Sitting, Cuff Size: Normal)   Pulse 69   Temp 97.7 F (36.5 C)   Resp 20   Ht 6\' 3"  (1.905 m)   Wt 172 lb (78 kg)   SpO2 100% Comment: RA  BMI 21.50 kg/m   Gen: no apparent distress Heart: RRR Lungs: CTA bilaterally Ext: no edema present Incisions: clean and dry, extensive ecchymosis present right chest/arm, some left AC  Diagnostic Tests:  CXR: no pleural effusions, pneumothorax, MV Ring and Atrial Clip present  Impression:  A/P:  1. S/P Minimally Invasive MV Repair, will clipping of LA Appendage... doing very well, however on coumadin at 5 mg daily.. INR is not scheduled to be checked until 8/20 which is over 10 days since hospital discharge.. he will be sent to Coumadin clinic today to check PT/INR 2. Activity- increase as tolerated, instructed to increase ambulation distance as tolerated, Cardiac rehab has contacted patient... okay to resume driving 3. Dental prophylaxis- will continue in future.. patient originally scheduled for dental cleaning for this Wednesday, instructed patient to wait about 6 weeks from surgery... he will reschedule 4. Dispo- patient doing well, get PT/INR checked today as last check was 8/10, F/U with Dr Oval Linsey on Friday 8/20 as scheduled, Echo on 9/20. RTC with Dr. Roxy Manns in 3 months  Ellwood Handler, PA-C [facility] [phone]

## 2019-10-10 NOTE — Patient Instructions (Signed)
Endocarditis is a potentially serious infection of heart valves or inside lining of the heart.  It occurs more commonly in patients with diseased heart valves (such as patient's with aortic or mitral valve disease) and in patients who have undergone heart valve repair or replacement.  Certain surgical and dental procedures may put you at risk, such as dental cleaning, other dental procedures, or any surgery involving the respiratory, urinary, gastrointestinal tract, gallbladder or prostate gland.   To minimize your chances for develooping endocarditis, maintain good oral health and seek prompt medical attention for any infections involving the mouth, teeth, gums, skin or urinary tract.    Always notify your doctor or dentist about your underlying heart valve condition before having any invasive procedures. You will need to take antibiotics before certain procedures, including all routine dental cleanings or other dental procedures.  Your cardiologist or dentist should prescribe these antibiotics for you to be taken ahead of time.   You may continue to gradually increase your physical activity as tolerated.  Refrain from any heavy lifting or strenuous use of your arms and shoulders until at least 8 weeks from the time of your surgery, and avoid activities that cause increased pain in your chest on the side of your surgical incision.  Otherwise you may continue to increase activities without any particular limitations.  Increase the intensity and duration of physical activity gradually.   You may return to driving an automobile as long as you are no longer requiring oral narcotic pain relievers during the daytime.  It would be wise to start driving only short distances during the daylight and gradually increase from there as you feel comfortable.

## 2019-10-14 ENCOUNTER — Encounter: Payer: Self-pay | Admitting: Cardiovascular Disease

## 2019-10-14 ENCOUNTER — Ambulatory Visit (INDEPENDENT_AMBULATORY_CARE_PROVIDER_SITE_OTHER): Payer: Medicare Other

## 2019-10-14 ENCOUNTER — Other Ambulatory Visit: Payer: Self-pay

## 2019-10-14 ENCOUNTER — Ambulatory Visit (INDEPENDENT_AMBULATORY_CARE_PROVIDER_SITE_OTHER): Payer: Medicare Other | Admitting: Cardiovascular Disease

## 2019-10-14 VITALS — BP 114/62 | HR 80 | Temp 96.8°F | Ht 75.0 in | Wt 172.0 lb

## 2019-10-14 DIAGNOSIS — I340 Nonrheumatic mitral (valve) insufficiency: Secondary | ICD-10-CM | POA: Diagnosis not present

## 2019-10-14 DIAGNOSIS — Z9889 Other specified postprocedural states: Secondary | ICD-10-CM | POA: Diagnosis not present

## 2019-10-14 DIAGNOSIS — I4821 Permanent atrial fibrillation: Secondary | ICD-10-CM | POA: Diagnosis not present

## 2019-10-14 DIAGNOSIS — Z5181 Encounter for therapeutic drug level monitoring: Secondary | ICD-10-CM

## 2019-10-14 LAB — I-STAT CREATININE: Creatinine, Ser: 2.3 — AB (ref 0.50–1.10)

## 2019-10-14 LAB — POCT INR: INR: 2.3 (ref 2.0–3.0)

## 2019-10-14 NOTE — Patient Instructions (Signed)
Medication Instructions:  Your physician recommends that you continue on your current medications as directed. Please refer to the Current Medication list given to you today.  *If you need a refill on your cardiac medications before your next appointment, please call your pharmacy*  Lab Work: NONE  Testing/Procedures: NONE  Follow-Up: At [facility], you and your health needs are our priority.  As part of our continuing mission to provide you with exceptional heart care, we have created designated Provider Care Teams.  These Care Teams include your primary Cardiologist (physician) and Advanced Practice Providers (APPs -  Physician Assistants and Nurse Practitioners) who all work together to provide you with the care you need, when you need it.  We recommend signing up for the patient portal called "MyChart".  Sign up information is provided on this After Visit Summary.  MyChart is used to connect with patients for Virtual Visits (Telemedicine).  Patients are able to view lab/test results, encounter notes, upcoming appointments, etc.  Non-urgent messages can be sent to your provider as well.   To learn more about what you can do with MyChart, go to [email or web address].    Your next appointment:   6 month(s)  The format for your next appointment:   In Person  Provider:   You may see Tiffany [place]ed[place], MD or one of the following Advanced Practice Providers on your designated Care Team:    Luke Kilroy, PA-C  Callie Goodrich, PA-C  Jesse Cleaver, FNP   

## 2019-10-14 NOTE — Patient Instructions (Signed)
Continue taking 1 tablet every day. Check INR Wednesday 10/19/2019. Please call with any questions [phone]

## 2019-10-14 NOTE — Progress Notes (Signed)
Cardiology Office Note  Morning Date:  10/14/2019   ID:  Raymond Ray, DOB 01-10-1951, MRN [id number]  PCP:  Crist Infante, MD  Cardiologist:  [name], MD  Electrophysiologist:  None   Evaluation Performed:  Follow-Up Visit  Chief Complaint:  Atrial fibrillation  History of Present Illness:    Raymond Ray is a 69 y.o. male with chronic atrial fibrillation (s/p LAA clipping) and mitral regurgitation 2/2 MVP s/p mitral valve repair, mild carotid stenosis, mild CAD, who presents for follow up.  Raymond Ray was previously a patient of Dr. Mare Ferrari.  He first developed atrial fibrillation in 1986.  He was asymptomatic and it was discovered on physical exam.  He was initially treated with amiodarone and this was discontinued due to concern for long-term toxicity.  He was then on quinidine and this was transitioned to digoxin when he moved from [place] to the [place]. Raymond Ray had an echo 10/09/09 that showed normal systolic function and posterior mitral valve prolapse with moderate mitral regurgitation and normal PA pressures.  He had a nuclear stress 04/29/02 showing no ischemia and an ejection fraction of 55%.  He developed epistaxis on Xarelto and switched back to Coumadin with an INR goal of 1.8-2.2.  However,  He switch to Eliquis with no overt bleeding.  He was noted to have a low iron levels and anemia.  He was started on iron supplementation and has continued on Eliquis.  He had a colonoscopy 08/2018 that revealed diverticuli and a 3 mm polyp that was removed.  At his last appointment he reported some mild exertional dyspnea.  He had a repeat echo 07/2019 that revealed LVEF 50 to 55%.  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  He underwent minimally invasive mitral valve repair with Dr. Roxy Manns on 09/2019.  He had a 36 mm Medtronic Sinuform Ring and the left atrial appendage was clipped.  He was switched back to warfarin for valvular atrial  fibrillation.  His appetite has been good.  He is trying to gain back the weight that he lost with the surgery.  His diet has been good.  He doesn't have much red meat or fried food. He has started walking three days per week.  He can tell when he walks up an incline and feels more short of breath.  He has no more LE edema and denies orthpnea and PND.  He is been feeling well and still has some discomfort from the incision that is improving daily.  He is planning to retire next month and looks forward to doing a lot of traveling.   Past Medical History:  Diagnosis Date  . Allergy   . Anemia   . Chronic atrial fibrillation ([place])   . Heart murmur   . History of colonoscopy 04/2001   negative  . Mitral valve prolapse   . Nonrheumatic mitral (valve) insufficiency   . Osteoarthritis of hip   . Permanent atrial fibrillation ([place])   . PONV (postoperative nausea and vomiting)    left knee cartilage removal;    . Prostate cancer ([place])   . 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 Tricuspid regurgitation   . Vertigo 01/07/2017   currently not symptomatic    Past Surgical History:  Procedure Laterality Date  . BUBBLE STUDY  08/24/2019   Procedure:  BUBBLE STUDY;  Surgeon: [name], MD;  Location: [facility] ENDOSCOPY;  Service: Cardiology;;  . CLIPPING OF ATRIAL APPENDAGE N/A 09/28/2019   Procedure: CLIPPING OF ATRIAL APPENDAGE USING ATRICURE CLIP SIZE 45MM;  Surgeon: Rexene Alberts, MD;  Location: [place];  Service: Open Heart Surgery;  Laterality: N/A;  . COLONOSCOPY    . KNEE SURGERY     left at age 89 in [place]  . LYMPHADENECTOMY Bilateral 06/02/2019   Procedure: LYMPHADENECTOMY, PELVIC;  Surgeon: [name], MD;  Location: [facility];  Service: Urology;  Laterality: Bilateral;  . MINIMALLY INVASIVE TRICUSPID VALVE  REPAIR Right 09/28/2019   Procedure: possible MINIMALLY INVASIVE TRICUSPID VALVE REPAIR;  Surgeon: Rexene Alberts, MD;  Location: [place];  Service: Open Heart Surgery;  Laterality: Right;  . MITRAL VALVE REPAIR Right 09/28/2019   Procedure: MINIMALLY INVASIVE MITRAL VALVE REPAIR (MVR) USING SIMUFORM 36MM;  Surgeon: Rexene Alberts, MD;  Location: [place];  Service: Open Heart Surgery;  Laterality: Right;  . PROSTATE BIOPSY    . RIGHT/LEFT HEART CATH AND CORONARY ANGIOGRAPHY N/A 09/07/2019   Procedure: RIGHT/LEFT HEART CATH AND CORONARY ANGIOGRAPHY;  Surgeon: Sherren Mocha, MD;  Location: [place] CV LAB;  Service: Cardiovascular;  Laterality: N/A;  . ROBOT ASSISTED LAPAROSCOPIC RADICAL PROSTATECTOMY N/A 06/02/2019   Procedure: XI ROBOTIC ASSISTED LAPAROSCOPIC RADICAL PROSTATECTOMY LEVEL 2;  Surgeon: [name], MD;  Location: [facility];  Service: Urology;  Laterality: N/A;  . spinal injection     December 2013  . TEE WITHOUT CARDIOVERSION N/A 08/24/2019   Procedure: TRANSESOPHAGEAL ECHOCARDIOGRAM (TEE);  Surgeon: [name], MD;  Location: [place];  Service: Cardiology;  Laterality: N/A;  . TEE WITHOUT CARDIOVERSION N/A 09/28/2019   Procedure: TRANSESOPHAGEAL ECHOCARDIOGRAM (TEE);  Surgeon: Rexene Alberts, MD;  Location: [place];  Service: Open Heart Surgery;  Laterality: N/A;  . TOTAL HIP ARTHROPLASTY Right 01/29/2017   Procedure: RIGHT TOTAL HIP ARTHROPLASTY ANTERIOR APPROACH;  Surgeon: Rod Can, MD;  Location: [facility];  Service: Orthopedics;  Laterality: Right;  Marland Kitchen VASECTOMY       Current Meds  Medication Sig  . ascorbic acid (VITAMIN C) 500 MG tablet Take 500 mg by mouth daily.   Marland Kitchen aspirin EC 81 MG EC tablet Take 1 tablet (81 mg total) by mouth daily. Swallow whole.  . ferrous [id number] C-folic acid (TRINSICON / FOLTRIN) capsule Take 1 capsule by mouth daily with breakfast.  . hydrocortisone cream 1 % Apply 1 application topically daily.   . metoprolol tartrate  (LOPRESSOR) 25 MG tablet Take 0.5 tablets (12.5 mg total) by mouth 2 (two) times daily.  . Multiple Vitamins-Minerals (MULTIVITAMIN WITH MINERALS) tablet Take 1 tablet by mouth daily. [facility] men   . oxyCODONE-acetaminophen (PERCOCET) 10-325 MG tablet Take 1 tablet by mouth every 4 (four) hours as needed for pain. Pt takes half tablet  . polyethylene glycol (MIRALAX / GLYCOLAX) 17 g packet Take 17 g by mouth daily.  Marland Kitchen warfarin (COUMADIN) 5 MG tablet Take 1 tablet (5 mg total) by mouth daily at 4 PM. As directed by coumadin clinic  . zolpidem (AMBIEN CR) 12.5 MG CR tablet Take 12.5 mg by mouth at bedtime.      Allergies:   Patient has no known allergies.   Social History   Tobacco Use  . Smoking status: Never Smoker  . Smokeless tobacco: Never Used  Vaping Use  . Vaping Use: Never used  Substance Use Topics  . Alcohol use: No  .  Drug use: No     Family Hx: The patient's family history includes Arrhythmia in his father; Breast cancer in his paternal aunt and sister; Cancer in his mother; Diabetes in his mother; Heart attack in his mother. There is no history of Colon cancer, Esophageal cancer, Stomach cancer, or Rectal cancer.  ROS:   Please see the history of present illness.     All other systems reviewed and are negative.   Prior CV studies:   The following studies were reviewed today:  Echo 09/2019: 1. Normal LV function; s/p MV repair with mild MS (mean gradient 5 mm Hg;  MVA 2.1 cm2) and no MR; moderate biatrial enlargement.  2. Left ventricular ejection fraction, by estimation, is 50 to 55%. The  left ventricle has low normal function. The left ventricle has no regional  wall motion abnormalities.  3. Right ventricular systolic function is normal. The right ventricular  size is normal.  4. Left atrial size was moderately dilated.  5. Right atrial size was moderately dilated.  6. The mitral valve has been repaired/replaced. Trivial mitral valve  regurgitation. Mild  mitral stenosis.  7. The aortic valve is tricuspid. Aortic valve regurgitation is not  visualized. No aortic stenosis is present.  8. The inferior vena cava is dilated in size with >50% respiratory  variability, suggesting right atrial pressure of 8 mmHg.   Echo 07/2019: 1. Left ventricular ejection fraction, by estimation, is 50 to 55%. The  left ventricle has low normal function. The left ventricle has no regional  wall motion abnormalities. Left ventricular diastolic function could not  be evaluated.  2. Right ventricular systolic function is normal. The right ventricular  size is normal. There is moderately elevated pulmonary artery systolic  pressure.  3. Left atrial size was severely dilated.  4. Right atrial size was severely dilated.  5. The mitral valve is myxomatous. Moderate to severe mitral valve  regurgitation.  6. Tricuspid valve regurgitation is mild to moderate.  7. The aortic valve is normal in structure. Aortic valve regurgitation is  not visualized. No aortic stenosis is present.  8. Aortic dilatation noted. 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   LHC 08/2019: 1.  Calcified nonobstructive proximal LAD stenosis 2.  Patent coronary arteries with mild diffuse irregularity and no flow-limiting coronary stenoses 3.  Normal right heart filling pressures with preserved cardiac output 4.  20 mm V wave consistent with the patient's known mitral regurgitation  Recommendation: Continued plans for cardiac surgical evaluation for mitral valve repair  Carotid Dopplers 09/2019: 1 to 39% ICA stenosis bilaterally.   Labs/Other Tests and Data Reviewed:    EKG:  An ECG dated 10/14/2019 was personally reviewed today and demonstrated:  Atrial fibrillation.  Rate 80 bpm.  Recent Labs: 08/11/2019: NT-Pro BNP 1,865 09/26/2019: ALT 12 09/30/2019: Magnesium 2.1 10/03/2019: BUN 25; Hemoglobin 8.2; Platelets 248; Potassium 4.0; Sodium 137 10/14/2019:  Creatinine, Ser 2.30   Recent Lipid Panel Lab Results  Component Value Date/Time   CHOL 166 03/03/2011 09:22 AM   TRIG 41.0 03/03/2011 09:22 AM   HDL 55.80 03/03/2011 09:22 AM   CHOLHDL 3 03/03/2011 09:22 AM   LDLCALC 102 (H) 03/03/2011 09:22 AM    04/30/2018: Total cholesterol 132, triglycerides 64, HDL 54, LDL 73 Sodium 140, BUN 28, creatinine 1.3 AST 18, ALT 15 TSH 1.37  07/08/2018: Hemoglobin 12.2  Wt Readings from Last 3 Encounters:  10/14/19 172 lb (78 kg)  10/10/19 172 lb (  78 kg)  10/04/19 164 lb 6.4 oz (74.6 kg)     Objective:    VS:  BP 114/62   Pulse 80   Temp (!) 96.8 F (36 C)   Ht 6\' 3"  (1.905 m)   Wt 172 lb (78 kg)   SpO2 99%   BMI 21.50 kg/m  , BMI Body mass index is 21.5 kg/m. GENERAL:  Well appearing HEENT: Pupils equal round and reactive, fundi not visualized, oral mucosa unremarkable NECK:  No jugular venous distention, waveform within normal limits, carotid upstroke brisk and symmetric, no bruits LUNGS:  Clear to auscultation bilaterally HEART:  Irregularly irregular.  PMI not displaced or sustained,S1 and S2 within normal limits, no S3, no S4, no clicks, no rubs, II/VI sysotlic murmur at the apex ABD:  Flat, positive bowel sounds normal in frequency in pitch, no bruits, no rebound, no guarding, no midline pulsatile mass, no hepatomegaly, no splenomegaly EXT:  2 plus pulses throughout, no edema, no cyanosis no clubbing SKIN:  No rashes no nodules NEURO:  Cranial nerves II through XII grossly intact, motor grossly intact throughout PSYCH:  Cognitively intact, oriented to person place and time   ASSESSMENT & PLAN:    # Persistent atrial fibrillation: Rates remain well-controlled and he is asymptomatic. Given that he had his mitral valve repaired he has valvular atrial fibrillation.  Check INR today.  Continue metoprolol.  LAA was clipped.  This patients CHA2DS2-VASc Score and unadjusted Ischemic Stroke Rate (% per year) is equal to 0.6 % stroke  rate/year from a score of 1  Above score calculated as 1 point each if present CHF, HTN, DM, Vascular=MI/PAD/Aortic Plaque, Age if 32-74, or Male Above score calculated as 2 points each if present age > 75, or Stroke/TIA/TE  # Mitral regurgitation:  # Tricuspid regurgitation: S/p annuloplasty ring repair and atrial appendage clipping 07/2019.  Mean gradient 5 mmHg on echo 09/2019.  He is euvolemic.  He will need antibiotic prophylaxis for dental procedures.  # Mild ascending aortic aneurysm: 3.8 cm on 07/2019.  # Mild LAD stenosis: 35% LAD on cath.  LDL was 74 on 04/2018.  Check lipids with PCP.  # Mild carotid stenosis.   Repeat in 2022.   Medication Adjustments/Labs and Tests Ordered: Current medicines are reviewed at length with the patient today.  Concerns regarding medicines are outlined above.   Tests Ordered: Orders Placed This Encounter  Procedures  . EKG 12-Lead    Medication Changes: No orders of the defined types were placed in this encounter.   Disposition:  Follow up in 6 month(s)  Signed, [name], MD  10/14/2019 12:33 PM    [facility]

## 2019-10-18 DIAGNOSIS — Z125 Encounter for screening for malignant neoplasm of prostate: Secondary | ICD-10-CM | POA: Diagnosis not present

## 2019-10-18 DIAGNOSIS — H2513 Age-related nuclear cataract, bilateral: Secondary | ICD-10-CM | POA: Diagnosis not present

## 2019-10-18 DIAGNOSIS — E785 Hyperlipidemia, unspecified: Secondary | ICD-10-CM | POA: Diagnosis not present

## 2019-10-19 ENCOUNTER — Other Ambulatory Visit: Payer: Self-pay

## 2019-10-19 ENCOUNTER — Ambulatory Visit (INDEPENDENT_AMBULATORY_CARE_PROVIDER_SITE_OTHER): Payer: Medicare Other | Admitting: Pharmacist

## 2019-10-19 DIAGNOSIS — I4821 Permanent atrial fibrillation: Secondary | ICD-10-CM

## 2019-10-19 LAB — POCT INR: INR: 2.1 (ref 2.0–3.0)

## 2019-10-21 ENCOUNTER — Telehealth (HOSPITAL_COMMUNITY): Payer: Self-pay

## 2019-10-21 NOTE — Telephone Encounter (Signed)
Pt called and stated he is interested in CR. Patient will come in for orientation on 11/29/19 @ 1030AM and will attend the 145PM exercise class.  Mailed letter.

## 2019-10-24 ENCOUNTER — Telehealth (HOSPITAL_COMMUNITY): Payer: Self-pay

## 2019-10-24 DIAGNOSIS — D649 Anemia, unspecified: Secondary | ICD-10-CM | POA: Diagnosis not present

## 2019-10-24 DIAGNOSIS — N529 Male erectile dysfunction, unspecified: Secondary | ICD-10-CM | POA: Diagnosis not present

## 2019-10-24 DIAGNOSIS — D6869 Other thrombophilia: Secondary | ICD-10-CM | POA: Diagnosis not present

## 2019-10-24 DIAGNOSIS — E785 Hyperlipidemia, unspecified: Secondary | ICD-10-CM | POA: Diagnosis not present

## 2019-10-24 DIAGNOSIS — I341 Nonrheumatic mitral (valve) prolapse: Secondary | ICD-10-CM | POA: Diagnosis not present

## 2019-10-24 DIAGNOSIS — Z1212 Encounter for screening for malignant neoplasm of rectum: Secondary | ICD-10-CM | POA: Diagnosis not present

## 2019-10-24 DIAGNOSIS — R972 Elevated prostate specific antigen [PSA]: Secondary | ICD-10-CM | POA: Diagnosis not present

## 2019-10-24 DIAGNOSIS — I4891 Unspecified atrial fibrillation: Secondary | ICD-10-CM | POA: Diagnosis not present

## 2019-10-24 DIAGNOSIS — E611 Iron deficiency: Secondary | ICD-10-CM | POA: Diagnosis not present

## 2019-10-24 DIAGNOSIS — L111 Transient acantholytic dermatosis [Grover]: Secondary | ICD-10-CM | POA: Diagnosis not present

## 2019-10-24 DIAGNOSIS — Z0000 Encounter for general adult medical examination without abnormal findings: Secondary | ICD-10-CM | POA: Diagnosis not present

## 2019-10-24 DIAGNOSIS — R82998 Other abnormal findings in urine: Secondary | ICD-10-CM | POA: Diagnosis not present

## 2019-10-24 DIAGNOSIS — I2510 Atherosclerotic heart disease of native coronary artery without angina pectoris: Secondary | ICD-10-CM | POA: Diagnosis not present

## 2019-10-24 DIAGNOSIS — I4811 Longstanding persistent atrial fibrillation: Secondary | ICD-10-CM | POA: Diagnosis not present

## 2019-10-24 NOTE — Telephone Encounter (Signed)
Pt called and stated that he needed to cancel his cardiac rehab orientation and sessions because he will be out of town and that he wanted to schedule in November. Advised pt to call back next month to schedule for Nov. Pt understood.

## 2019-10-26 ENCOUNTER — Other Ambulatory Visit: Payer: Self-pay | Admitting: Surgical

## 2019-10-28 ENCOUNTER — Other Ambulatory Visit: Payer: Self-pay

## 2019-10-28 ENCOUNTER — Ambulatory Visit (INDEPENDENT_AMBULATORY_CARE_PROVIDER_SITE_OTHER): Payer: Medicare Other

## 2019-10-28 DIAGNOSIS — I4821 Permanent atrial fibrillation: Secondary | ICD-10-CM | POA: Diagnosis not present

## 2019-10-28 DIAGNOSIS — Z5181 Encounter for therapeutic drug level monitoring: Secondary | ICD-10-CM | POA: Diagnosis not present

## 2019-10-28 LAB — POCT INR: INR: 2.4 (ref 2.0–3.0)

## 2019-10-28 NOTE — Patient Instructions (Signed)
Continue taking 1 tablet every day. Check INR in 3 weeks. Please call with any questions [phone]

## 2019-11-14 ENCOUNTER — Other Ambulatory Visit: Payer: Self-pay

## 2019-11-14 ENCOUNTER — Ambulatory Visit (HOSPITAL_COMMUNITY): Payer: Medicare Other | Attending: Cardiology

## 2019-11-14 DIAGNOSIS — I340 Nonrheumatic mitral (valve) insufficiency: Secondary | ICD-10-CM | POA: Diagnosis not present

## 2019-11-14 DIAGNOSIS — Z9889 Other specified postprocedural states: Secondary | ICD-10-CM

## 2019-11-15 LAB — ECHOCARDIOGRAM COMPLETE
Area-P 1/2: 3.42 cm2
P 1/2 time: 64.2 msec
S' Lateral: 2.84 cm

## 2019-11-16 ENCOUNTER — Ambulatory Visit (INDEPENDENT_AMBULATORY_CARE_PROVIDER_SITE_OTHER): Payer: Medicare Other

## 2019-11-16 ENCOUNTER — Other Ambulatory Visit: Payer: Self-pay

## 2019-11-16 DIAGNOSIS — Z5181 Encounter for therapeutic drug level monitoring: Secondary | ICD-10-CM | POA: Diagnosis not present

## 2019-11-16 DIAGNOSIS — I4821 Permanent atrial fibrillation: Secondary | ICD-10-CM

## 2019-11-16 LAB — POCT INR: INR: 3 (ref 2.0–3.0)

## 2019-11-16 NOTE — Patient Instructions (Signed)
Continue taking 1 tablet every day. Check INR in 4 weeks. Please call with any questions [phone]

## 2019-11-22 ENCOUNTER — Telehealth: Payer: Self-pay | Admitting: *Deleted

## 2019-11-22 ENCOUNTER — Encounter (HOSPITAL_COMMUNITY): Payer: Self-pay

## 2019-11-22 NOTE — Telephone Encounter (Signed)
Attempted to call patient in regards to Cardiac Rehab - LM on VM Mailed letter 

## 2019-11-22 NOTE — Telephone Encounter (Signed)
Called patient about labs received from PCP Dr Joylene Draft put patient on Crestor 5 mg daily  Will forward to Dr Oval Linsey for review

## 2019-11-23 NOTE — Telephone Encounter (Signed)
OK thanks.  Let's see what his numbers look like in a few months.

## 2019-11-24 ENCOUNTER — Other Ambulatory Visit: Payer: Self-pay | Admitting: Surgical

## 2019-11-28 DIAGNOSIS — E611 Iron deficiency: Secondary | ICD-10-CM | POA: Diagnosis not present

## 2019-11-28 DIAGNOSIS — R202 Paresthesia of skin: Secondary | ICD-10-CM | POA: Diagnosis not present

## 2019-11-28 DIAGNOSIS — D649 Anemia, unspecified: Secondary | ICD-10-CM | POA: Diagnosis not present

## 2019-11-29 ENCOUNTER — Ambulatory Visit (HOSPITAL_COMMUNITY): Payer: Medicare Other

## 2019-12-01 ENCOUNTER — Other Ambulatory Visit: Payer: Self-pay | Admitting: Cardiovascular Disease

## 2019-12-01 MED ORDER — METOPROLOL TARTRATE 25 MG PO TABS: 12.5000 mg | ORAL_TABLET | Freq: Two times a day (BID) | ORAL | 2 refills | Status: DC

## 2019-12-01 NOTE — Telephone Encounter (Signed)
*  STAT* If patient is at the pharmacy, call can be transferred to refill team.   1. Which medications need to be refilled? (please list name of each medication and dose if known) metoprolol tartrate (LOPRESSOR) 25 MG tablet  2. Which pharmacy/location (including street and city if local pharmacy) is medication to be sent to? [id number] [place] [place] - [address]  3. Do they need a 30 day or 90 day supply? 90 day supply

## 2019-12-05 ENCOUNTER — Ambulatory Visit (HOSPITAL_COMMUNITY): Payer: Medicare Other

## 2019-12-07 ENCOUNTER — Ambulatory Visit (HOSPITAL_COMMUNITY): Payer: Medicare Other

## 2019-12-09 ENCOUNTER — Ambulatory Visit (HOSPITAL_COMMUNITY): Payer: Medicare Other

## 2019-12-12 ENCOUNTER — Ambulatory Visit (HOSPITAL_COMMUNITY): Payer: Medicare Other

## 2019-12-14 ENCOUNTER — Ambulatory Visit (HOSPITAL_COMMUNITY): Payer: Medicare Other

## 2019-12-15 DIAGNOSIS — Z23 Encounter for immunization: Secondary | ICD-10-CM | POA: Diagnosis not present

## 2019-12-16 ENCOUNTER — Ambulatory Visit (HOSPITAL_COMMUNITY): Payer: Medicare Other

## 2019-12-16 ENCOUNTER — Ambulatory Visit (INDEPENDENT_AMBULATORY_CARE_PROVIDER_SITE_OTHER): Payer: Medicare Other | Admitting: Pharmacist Clinician (PhC)/ Clinical Pharmacy Specialist

## 2019-12-16 ENCOUNTER — Other Ambulatory Visit: Payer: Self-pay

## 2019-12-16 DIAGNOSIS — Z7901 Long term (current) use of anticoagulants: Secondary | ICD-10-CM

## 2019-12-16 DIAGNOSIS — I4821 Permanent atrial fibrillation: Secondary | ICD-10-CM | POA: Diagnosis not present

## 2019-12-16 LAB — POCT INR: INR: 3.4 — AB (ref 2.0–3.0)

## 2019-12-19 ENCOUNTER — Ambulatory Visit (HOSPITAL_COMMUNITY): Payer: Medicare Other

## 2019-12-21 ENCOUNTER — Ambulatory Visit (HOSPITAL_COMMUNITY): Payer: Medicare Other

## 2019-12-23 ENCOUNTER — Ambulatory Visit (HOSPITAL_COMMUNITY): Payer: Medicare Other

## 2019-12-26 ENCOUNTER — Ambulatory Visit (HOSPITAL_COMMUNITY): Payer: Medicare Other

## 2019-12-27 ENCOUNTER — Telehealth (HOSPITAL_COMMUNITY): Payer: Self-pay | Admitting: Pharmacist

## 2019-12-28 ENCOUNTER — Ambulatory Visit (HOSPITAL_COMMUNITY): Payer: Medicare Other

## 2019-12-29 ENCOUNTER — Telehealth (HOSPITAL_COMMUNITY): Payer: Self-pay | Admitting: Pharmacist

## 2019-12-30 ENCOUNTER — Ambulatory Visit (HOSPITAL_COMMUNITY): Payer: Medicare Other

## 2020-01-01 DIAGNOSIS — Z23 Encounter for immunization: Secondary | ICD-10-CM | POA: Diagnosis not present

## 2020-01-02 ENCOUNTER — Ambulatory Visit (HOSPITAL_COMMUNITY): Payer: Medicare Other

## 2020-01-03 DIAGNOSIS — M25552 Pain in left hip: Secondary | ICD-10-CM | POA: Diagnosis not present

## 2020-01-03 DIAGNOSIS — G894 Chronic pain syndrome: Secondary | ICD-10-CM | POA: Diagnosis not present

## 2020-01-03 NOTE — Telephone Encounter (Signed)
Cardiac Rehab Medication Review by a Pharmacist  Does the patient  feel that his/her medications are working for him/her?  yes  Has the patient been experiencing any side effects to the medications prescribed?  no  Does the patient measure his/her own blood pressure or blood glucose at home?  No, pt got a wrist cuff but it didn't work properly so he returned it and didn't buy another.    Does the patient have any problems obtaining medications due to transportation or finances?   no  Understanding of regimen: good Understanding of indications: good Potential of compliance: good    Wilson Singer, PharmD PGY1 Pharmacy Resident 01/03/2020 4:52 PM

## 2020-01-04 ENCOUNTER — Ambulatory Visit (HOSPITAL_COMMUNITY): Payer: Medicare Other

## 2020-01-04 ENCOUNTER — Telehealth (HOSPITAL_COMMUNITY): Payer: Self-pay

## 2020-01-04 ENCOUNTER — Other Ambulatory Visit: Payer: Self-pay

## 2020-01-04 ENCOUNTER — Encounter (HOSPITAL_COMMUNITY)
Admission: RE | Admit: 2020-01-04 | Discharge: 2020-01-04 | Disposition: A | Discharge status: Home / Self Care | Payer: Medicare Other | Source: Ambulatory Visit | Attending: Cardiovascular Disease | Admitting: Cardiovascular Disease

## 2020-01-04 DIAGNOSIS — Z9889 Other specified postprocedural states: Secondary | ICD-10-CM | POA: Insufficient documentation

## 2020-01-04 NOTE — Progress Notes (Signed)
Cardiac Rehab Telephone Note:  Successful telephone encounter to Raymond Ray to confirm Cardiac Rehab orientation appointment for 01/05/20 at 10:00 am. Nursing assessment completed. Patient questions answered. Instructions for appointment provided. Patient screening for Covid-19 negative.  [name] E. Rollene Rotunda RN, BSN [place]. [facility]  Cardiac and Pulmonary Rehabilitation Phone: [phone] Fax: [phone]

## 2020-01-04 NOTE — Telephone Encounter (Signed)
Cardiac Rehab Note:  Second unsuccessful telephone call to [place] B. Gerrard to confirm cardiac rehab orientation appointment 01/05/20 at 10:00 am. [employer] compliant VM message left requesting call back to [phone].  Brienne Liguori E. Rollene Rotunda RN, BSN [place]. [facility]  Cardiac and Pulmonary Rehabilitation Phone: [phone] Fax: [phone]

## 2020-01-05 ENCOUNTER — Encounter (HOSPITAL_COMMUNITY): Payer: Self-pay

## 2020-01-05 ENCOUNTER — Encounter (HOSPITAL_COMMUNITY)
Admission: RE | Admit: 2020-01-05 | Discharge: 2020-01-05 | Disposition: A | Discharge status: Home / Self Care | Payer: Medicare Other | Source: Ambulatory Visit | Attending: Cardiovascular Disease | Admitting: Cardiovascular Disease

## 2020-01-05 VITALS — BP 122/74 | Ht 75.5 in | Wt 174.2 lb

## 2020-01-05 DIAGNOSIS — Z9889 Other specified postprocedural states: Secondary | ICD-10-CM

## 2020-01-05 NOTE — Progress Notes (Signed)
Raymond Ray was noted be walking at a fast pace for his initial walk test. Patient appeared to have a somewhat unsteady gait. Telemetry rhythm Atrial Fib 170. Walk test terminated due to rapid heart rate. Patient reported having  5/10 left hip pain. Heart came down to the 90's low 100's after sitting. Beyond the complaint of left hip pain. Patient asymptomatic. Blood pressure 136/80. SA02 99% on room air. Sande Rives PA paged and notified. No new orders received. Will fax today's ECG tracing's to Dr Blenda Mounts office for review and request a target heart rate increase for the patient during exercise.Barnet Pall, RN,BSN 01/05/2020 2:04 PM

## 2020-01-05 NOTE — Progress Notes (Signed)
Cardiac Individual Treatment Plan  Patient Details  Name: Raymond Ray MRN: [id number] Date of Birth: Apr 15, 1950 Referring Provider:     [place] from 01/05/2020 in [place]  Referring Provider [name], MD      Initial Encounter Date:    CARDIAC REHAB PHASE II ORIENTATION from 01/05/2020 in [place]  Date 01/05/20      Visit Diagnosis: S/P minimally-invasive mitral valve repair 09/28/19  Patient's Home Medications on Admission:  Current Outpatient Medications:  .  ascorbic acid (VITAMIN C) 500 MG tablet, Take 500 mg by mouth daily. , Disp: , Rfl:  .  aspirin EC 81 MG EC tablet, Take 1 tablet (81 mg total) by mouth daily. Swallow whole., Disp: , Rfl:  .  ferrous [id number] C-folic acid (TRINSICON / FOLTRIN) capsule, Take 1 capsule by mouth daily with breakfast. (Patient not taking: Reported on 01/03/2020), Disp: 30 capsule, Rfl: 1 .  hydrocortisone cream 1 %, Apply 1 application topically daily.  (Patient not taking: Reported on 01/03/2020), Disp: , Rfl:  .  metoprolol tartrate (LOPRESSOR) 25 MG tablet, Take 0.5 tablets (12.5 mg total) by mouth 2 (two) times daily., Disp: 90 tablet, Rfl: 2 .  Multiple Vitamins-Minerals (MULTIVITAMIN WITH MINERALS) tablet, Take 1 tablet by mouth daily. [facility] men , Disp: , Rfl:  .  oxyCODONE-acetaminophen (PERCOCET) 10-325 MG tablet, Take 1 tablet by mouth every 4 (four) hours as needed for pain. Pt takes half tablet, Disp: , Rfl:  .  polyethylene glycol (MIRALAX / GLYCOLAX) 17 g packet, Take 17 g by mouth daily., Disp: , Rfl:  .  rosuvastatin (CRESTOR) 5 MG tablet, Take 5 mg by mouth daily., Disp: , Rfl:  .  warfarin (COUMADIN) 5 MG tablet, Take 1 tablet (5 mg total) by mouth daily at 4 PM. As directed by coumadin clinic, Disp: 100 tablet, Rfl: 1 .  zolpidem (AMBIEN CR) 12.5 MG CR tablet, Take 12.5 mg by mouth at bedtime. , Disp: , Rfl:    Past Medical History: Past Medical History:  Diagnosis Date  . Allergy   . Anemia   . Chronic atrial fibrillation ([place])   . Heart murmur   . History of colonoscopy 04/2001   negative  . Mitral valve prolapse   . Nonrheumatic mitral (valve) insufficiency   . Osteoarthritis of hip   . Permanent atrial fibrillation ([place])   . PONV (postoperative nausea and vomiting)    left knee cartilage removal;    . Prostate cancer ([place])   . 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 Tricuspid regurgitation   . Vertigo 01/07/2017   currently not symptomatic     Tobacco Use: Social History   Tobacco Use  Smoking Status Never Smoker  Smokeless Tobacco Never Used    Labs: Recent Review Flowsheet Data    Labs for ITP Cardiac and Pulmonary Rehab Latest Ref Rng & Units 09/28/2019 09/28/2019 09/28/2019 09/28/2019 09/28/2019   Cholestrol 0 - 200 mg/dL - - - - -   LDLCALC 0 - 99 mg/dL - - - - -   HDL >39.00 mg/dL - - - - -   Trlycerides 0 - 149 mg/dL - - - - -   Hemoglobin A1c 4.8 - 5.6 % - - - - -   PHART 7.35 - 7.45 7.449 -  7.421 - 7.310(L)   PCO2ART 32 - 48 mmHg 33.9 - 34.5 - 45.4   HCO3 20.0 - 28.0 mmol/L 23.5 - 22.4 - 23.1   TCO2 22 - 32 mmol/L 25 24 23  21(L) 25   ACIDBASEDEF 0.0 - 2.0 mmol/L - - 2.0 - 3.0(H)   O2SAT % 100.0 - 100.0 - 96.0      Capillary Blood Glucose: Lab Results  Component Value Date   GLUCAP 103 (H) 09/29/2019   GLUCAP 135 (H) 09/29/2019   GLUCAP 142 (H) 09/29/2019   GLUCAP 130 (H) 09/29/2019   GLUCAP 156 (H) 09/29/2019     Exercise Target Goals: Exercise Program Goal: Individual exercise prescription set using results from initial 6 min walk test and THRR while considering  patient's activity barriers and safety.   Exercise Prescription Goal: Starting with aerobic activity 30 plus minutes a  day, 3 days per week for initial exercise prescription. Provide home exercise prescription and guidelines that participant acknowledges understanding prior to discharge.  Activity Barriers & Risk Stratification:  Activity Barriers & Cardiac Risk Stratification - 01/05/20 1228      Activity Barriers & Cardiac Risk Stratification   Activity Barriers Right Hip Replacement;Other (comment);Deconditioning    Comments Bilateral neuropathy of the feet    Cardiac Risk Stratification High           6 Minute Walk:  6 Minute Walk    Row Name 01/05/20 1035         6 Minute Walk   Phase Initial     Distance 800 feet     Walk Time 6 minutes     # of Rest Breaks 1  Test stopped at 3:03 due to HR of 171 bpm     MPH 1.5     METS 3.46     RPE 11     Perceived Dyspnea  0     VO2 Peak 12.11     Symptoms Yes (comment)     Comments left hip pain (5 out of 10)     Resting HR 88 bpm     Resting BP 122/74     Resting Oxygen Saturation  99 %     Exercise Oxygen Saturation  during 6 min walk 99 %     Max Ex. HR 171 bpm     Max Ex. BP 136/80     2 Minute Post BP 118/66            Oxygen Initial Assessment:   Oxygen Re-Evaluation:   Oxygen Discharge (Final Oxygen Re-Evaluation):   Initial Exercise Prescription:  Initial Exercise Prescription - 01/05/20 1200      Date of Initial Exercise RX and Referring Provider   Date 01/05/20    Referring Provider [name], MD    Expected Discharge Date 03/02/20      Bike   Level 1.5    Minutes 15    METs 2.4      NuStep   Level 2    SPM 85    Minutes 15    METs 2.2      Prescription Details   Frequency (times per week) 3    Duration Progress to 30 minutes of continuous aerobic without signs/symptoms of physical distress      Intensity   THRR 40-80% of Max Heartrate 61-122    Ratings of Perceived Exertion 11-13    Perceived Dyspnea 0-4      Progression   Progression Continue progressive overload as per  policy without  signs/symptoms or physical distress.      Resistance Training   Training Prescription Yes    Weight 3 lbs    Reps 10-15           Perform Capillary Blood Glucose checks as needed.  Exercise Prescription Changes:   Exercise Comments:   Exercise Goals and Review:  Exercise Goals    Row Name 01/05/20 1232             Exercise Goals   Increase Physical Activity Yes       Intervention Provide advice, education, support and counseling about physical activity/exercise needs.;Develop an individualized exercise prescription for aerobic and resistive training based on initial evaluation findings, risk stratification, comorbidities and participant's personal goals.       Expected Outcomes Short Term: Attend rehab on a regular basis to increase amount of physical activity.;Long Term: Add in home exercise to make exercise part of routine and to increase amount of physical activity.;Long Term: Exercising regularly at least 3-5 days a week.       Increase Strength and Stamina Yes       Intervention Provide advice, education, support and counseling about physical activity/exercise needs.;Develop an individualized exercise prescription for aerobic and resistive training based on initial evaluation findings, risk stratification, comorbidities and participant's personal goals.       Expected Outcomes Short Term: Perform resistance training exercises routinely during rehab and add in resistance training at home;Short Term: Increase workloads from initial exercise prescription for resistance, speed, and METs.;Long Term: Improve cardiorespiratory fitness, muscular endurance and strength as measured by increased METs and functional capacity (6MWT)       Able to understand and use rate of perceived exertion (RPE) scale Yes       Intervention Provide education and explanation on how to use RPE scale       Expected Outcomes Short Term: Able to use RPE daily in rehab to express subjective intensity  level;Long Term:  Able to use RPE to guide intensity level when exercising independently       Knowledge and understanding of Target Heart Rate Range (THRR) Yes       Intervention Provide education and explanation of THRR including how the numbers were predicted and where they are located for reference       Expected Outcomes Short Term: Able to state/look up THRR;Short Term: Able to use daily as guideline for intensity in rehab;Long Term: Able to use THRR to govern intensity when exercising independently       Understanding of Exercise Prescription Yes       Intervention Provide education, explanation, and written materials on patient's individual exercise prescription       Expected Outcomes Short Term: Able to explain program exercise prescription;Long Term: Able to explain home exercise prescription to exercise independently              Exercise Goals Re-Evaluation :    Discharge Exercise Prescription (Final Exercise Prescription Changes):   Nutrition:  Target Goals: Understanding of nutrition guidelines, daily intake of sodium 1500mg , cholesterol 200mg , calories 30% from fat and 7% or less from saturated fats, daily to have 5 or more servings of fruits and vegetables.  Biometrics:  Pre Biometrics - 01/05/20 1000      Pre Biometrics   Waist Circumference 37 inches    Hip Circumference 42 inches    Waist to Hip Ratio 0.88 %    Triceps Skinfold 8 mm    % Body  Fat 20.9 %    Grip Strength 33 kg    Flexibility 11 in    Single Leg Stand 26.3 seconds            Nutrition Therapy Plan and Nutrition Goals:   Nutrition Assessments:   Nutrition Goals Re-Evaluation:   Nutrition Goals Discharge (Final Nutrition Goals Re-Evaluation):   Psychosocial: Target Goals: Acknowledge presence or absence of significant depression and/or stress, maximize coping skills, provide positive support system. Participant is able to verbalize types and ability to use techniques and skills  needed for reducing stress and depression.  Initial Review & Psychosocial Screening:  Initial Psych Review & Screening - 01/05/20 1329      Initial Review   Current issues with None Identified      Family Dynamics   Good Support System? Yes   Timmothy Sours has his wife and children for support     Barriers   Psychosocial barriers to participate in program There are no identifiable barriers or psychosocial needs.      Screening Interventions   Interventions Encouraged to exercise           Quality of Life Scores:  Quality of Life - 01/05/20 1223      Quality of Life   Select Quality of Life      Quality of Life Scores   Health/Function Pre 26.47 %    Socioeconomic Pre 25.36 %    Psych/Spiritual Pre 28.29 %    Family Pre 28.8 %    GLOBAL Pre 24.31 %          Scores of 19 and below usually indicate a poorer quality of life in these areas.  A difference of  2-3 points is a clinically meaningful difference.  A difference of 2-3 points in the total score of the Quality of Life Index has been associated with significant improvement in overall quality of life, self-image, physical symptoms, and general health in studies assessing change in quality of life.  PHQ-9: Recent Review Flowsheet Data    Depression screen [facility] 2/9 01/05/2020   Decreased Interest 0   Down, Depressed, Hopeless 0   PHQ - 2 Score 0     Interpretation of Total Score  Total Score Depression Severity:  1-4 = Minimal depression, 5-9 = Mild depression, 10-14 = Moderate depression, 15-19 = Moderately severe depression, 20-27 = Severe depression   Psychosocial Evaluation and Intervention:   Psychosocial Re-Evaluation:   Psychosocial Discharge (Final Psychosocial Re-Evaluation):   Vocational Rehabilitation: Provide vocational rehab assistance to qualifying candidates.   Vocational Rehab Evaluation & Intervention:  Vocational Rehab - 01/05/20 1331      Initial Vocational Rehab Evaluation & Intervention    Assessment shows need for Vocational Rehabilitation No   Timmothy Sours recently retired and does not need vocational rehab at this time          Education: Education Goals: Education classes will be provided on a weekly basis, covering required topics. Participant will state understanding/return demonstration of topics presented.  Learning Barriers/Preferences:  Learning Barriers/Preferences - 01/05/20 1224      Learning Barriers/Preferences   Learning Barriers None    Learning Preferences None           Education Topics: Hypertension, Hypertension Reduction -Define heart disease and high blood pressure. Discus how high blood pressure affects the body and ways to reduce high blood pressure.   Exercise and Your Heart -Discuss why it is important to exercise, the FITT principles of exercise, normal and  abnormal responses to exercise, and how to exercise safely.   Angina -Discuss definition of angina, causes of angina, treatment of angina, and how to decrease risk of having angina.   Cardiac Medications -Review what the following cardiac medications are used for, how they affect the body, and side effects that may occur when taking the medications.  Medications include Aspirin, Beta blockers, calcium channel blockers, ACE Inhibitors, angiotensin receptor blockers, diuretics, digoxin, and antihyperlipidemics.   Congestive Heart Failure -Discuss the definition of CHF, how to live with CHF, the signs and symptoms of CHF, and how keep track of weight and sodium intake.   Heart Disease and Intimacy -Discus the effect sexual activity has on the heart, how changes occur during intimacy as we age, and safety during sexual activity.   Smoking Cessation / COPD -Discuss different methods to quit smoking, the health benefits of quitting smoking, and the definition of COPD.   Nutrition I: Fats -Discuss the types of cholesterol, what cholesterol does to the heart, and how cholesterol levels  can be controlled.   Nutrition II: Labels -Discuss the different components of food labels and how to read food label   Heart Parts/Heart Disease and PAD -Discuss the anatomy of the heart, the pathway of blood circulation through the heart, and these are affected by heart disease.   Stress I: Signs and Symptoms -Discuss the causes of stress, how stress may lead to anxiety and depression, and ways to limit stress.   Stress II: Relaxation -Discuss different types of relaxation techniques to limit stress.   Warning Signs of Stroke / TIA -Discuss definition of a stroke, what the signs and symptoms are of a stroke, and how to identify when someone is having stroke.   Knowledge Questionnaire Score:  Knowledge Questionnaire Score - 01/05/20 1225      Knowledge Questionnaire Score   Pre Score 21/24           Core Components/Risk Factors/Patient Goals at Admission:  Personal Goals and Risk Factors at Admission - 01/05/20 1331      Core Components/Risk Factors/Patient Goals on Admission    Weight Management Weight Maintenance;Yes    Intervention Weight Management: Develop a combined nutrition and exercise program designed to reach desired caloric intake, while maintaining appropriate intake of nutrient and fiber, sodium and fats, and appropriate energy expenditure required for the weight goal.;Weight Management: Provide education and appropriate resources to help participant work on and attain dietary goals.    Expected Outcomes Short Term: Continue to assess and modify interventions until short term weight is achieved;Long Term: Adherence to nutrition and physical activity/exercise program aimed toward attainment of established weight goal;Weight Maintenance: Understanding of the daily nutrition guidelines, which includes 25-35% calories from fat, 7% or less cal from saturated fats, less than 200mg  cholesterol, less than 1.5gm of sodium, & 5 or more servings of fruits and vegetables  daily;Understanding recommendations for meals to include 15-35% energy as protein, 25-35% energy from fat, 35-60% energy from carbohydrates, less than 200mg  of dietary cholesterol, 20-35 gm of total fiber daily;Understanding of distribution of calorie intake throughout the day with the consumption of 4-5 meals/snacks    Lipids Yes    Intervention Provide education and support for participant on nutrition & aerobic/resistive exercise along with prescribed medications to achieve LDL 70mg , HDL >40mg .    Expected Outcomes Short Term: Participant states understanding of desired cholesterol values and is compliant with medications prescribed. Participant is following exercise prescription and nutrition guidelines.;Long Term: Cholesterol controlled with  medications as prescribed, with individualized exercise RX and with personalized nutrition plan. Value goals: LDL < 70mg , HDL > 40 mg.           Core Components/Risk Factors/Patient Goals Review:    Core Components/Risk Factors/Patient Goals at Discharge (Final Review):    ITP Comments:  ITP Comments    Row Name 01/05/20 1324           ITP Comments Dr Fransico Him MD, Medical Director              Comments:Mr Sabra Heck  attended orientation on 01/05/2020 to review rules and guidelines for program.  Completed  3 minutes of the 6 minute walk test, Intitial ITP, and exercise prescription.  VSS. Telemetry-Atrial Fibrillation. Walk test was stopped at 3 minutes 03 seconds due to rapid Atrial Fib max rate 171. Mr Caamano admits that he misunderstood and was walking as fast as he could instead of at a comfortable pace. Please see previous documentation. Safety measures and social distancing in place per CDC guidelines.Barnet Pall, RN,BSN 01/05/2020 2:11 PM

## 2020-01-06 ENCOUNTER — Ambulatory Visit (HOSPITAL_COMMUNITY): Payer: Medicare Other

## 2020-01-06 ENCOUNTER — Other Ambulatory Visit: Payer: Self-pay | Admitting: Surgical

## 2020-01-06 ENCOUNTER — Ambulatory Visit (INDEPENDENT_AMBULATORY_CARE_PROVIDER_SITE_OTHER): Payer: Medicare Other

## 2020-01-06 ENCOUNTER — Other Ambulatory Visit: Payer: Self-pay

## 2020-01-06 ENCOUNTER — Telehealth: Payer: Self-pay | Admitting: *Deleted

## 2020-01-06 DIAGNOSIS — Z5181 Encounter for therapeutic drug level monitoring: Secondary | ICD-10-CM

## 2020-01-06 DIAGNOSIS — I4821 Permanent atrial fibrillation: Secondary | ICD-10-CM | POA: Diagnosis not present

## 2020-01-06 LAB — POCT INR: INR: 1.9 — AB (ref 2.0–3.0)

## 2020-01-06 NOTE — Patient Instructions (Signed)
Take 2 tablets tonight then continue taking 1 tablet every day. Check INR in 4 weeks. Please call with any questions [phone]

## 2020-01-06 NOTE — Telephone Encounter (Signed)
Discussed with Dr Oval Linsey and <130 is goal heart rate Will forward to [name] so she will be aware

## 2020-01-06 NOTE — Telephone Encounter (Signed)
-----   Message from Magda Kiel, RN sent at 01/05/2020  2:47 PM EST ----- Regarding: Rapid heart rate at cardiac rehab Orientation Good afternoon Dr Oval Linsey,  Mr Glazier was noted be walking at a fast pace for his initial walk test. Patient appeared to have a somewhat unsteady gait. Telemetry rhythm Atrial Fib 170. Walk test terminated due to rapid heart rate. Patient reported having  5/10 left hip pain. Heart came down to the 90's low 100's after sitting. Beyond the complaint of left hip pain. Patient asymptomatic. Blood pressure 136/80. SA02 99% on room air. Sande Rives PA paged and notified.  Mr Barfoot said he misunderstood and thought he was supposed to walk at a fast pace.  Please advise what is an acceptable upper heart rate limit? Mr Villalpando's current target heart rate is 61-122 for his age. During Mr Wages's walk test his heart rate was in the variable in the 130's to 150's before we stopped his walk test at 3 minutes.   Thanks for your input!  I faxed the ECG tracings to your office for review.  Sincerely  Barnet Pall RN Cardiac Rehab

## 2020-01-09 ENCOUNTER — Telehealth: Payer: Self-pay | Admitting: Cardiovascular Disease

## 2020-01-09 ENCOUNTER — Other Ambulatory Visit: Payer: Self-pay

## 2020-01-09 ENCOUNTER — Ambulatory Visit (INDEPENDENT_AMBULATORY_CARE_PROVIDER_SITE_OTHER): Payer: Medicare Other | Admitting: Thoracic Surgery (Cardiothoracic Vascular Surgery)

## 2020-01-09 ENCOUNTER — Encounter (HOSPITAL_COMMUNITY)
Admission: RE | Admit: 2020-01-09 | Discharge: 2020-01-09 | Disposition: A | Discharge status: Home / Self Care | Payer: Medicare Other | Source: Ambulatory Visit | Attending: Cardiovascular Disease | Admitting: Cardiovascular Disease

## 2020-01-09 ENCOUNTER — Ambulatory Visit (HOSPITAL_COMMUNITY): Payer: Medicare Other

## 2020-01-09 ENCOUNTER — Encounter: Payer: Self-pay | Admitting: Thoracic Surgery (Cardiothoracic Vascular Surgery)

## 2020-01-09 VITALS — BP 125/62 | HR 76 | Temp 97.9°F | Resp 20 | Ht 75.0 in | Wt 174.0 lb

## 2020-01-09 DIAGNOSIS — Z09 Encounter for follow-up examination after completed treatment for conditions other than malignant neoplasm: Secondary | ICD-10-CM

## 2020-01-09 DIAGNOSIS — Z9889 Other specified postprocedural states: Secondary | ICD-10-CM

## 2020-01-09 DIAGNOSIS — I340 Nonrheumatic mitral (valve) insufficiency: Secondary | ICD-10-CM | POA: Diagnosis not present

## 2020-01-09 NOTE — Patient Instructions (Signed)
Continue all previous medications without any changes at this time.  Discuss with Dr. Oval Linsey whether or not to remain on Coumadin (warfarin) or consider switching back to Eliquis for long term anticoagulation.  You probably could stop taking aspirin as well.  You may resume unrestricted physical activity without any particular limitations at this time.  Endocarditis is a potentially serious infection of heart valves or inside lining of the heart.  It occurs more commonly in patients with diseased heart valves (such as patient's with aortic or mitral valve disease) and in patients who have undergone heart valve repair or replacement.  Certain surgical and dental procedures may put you at risk, such as dental cleaning, other dental procedures, or any surgery involving the respiratory, urinary, gastrointestinal tract, gallbladder or prostate gland.   To minimize your chances for develooping endocarditis, maintain good oral health and seek prompt medical attention for any infections involving the mouth, teeth, gums, skin or urinary tract.    Always notify your doctor or dentist about your underlying heart valve condition before having any invasive procedures. You will need to take antibiotics before certain procedures, including all routine dental cleanings or other dental procedures.  Your cardiologist or dentist should prescribe these antibiotics for you to be taken ahead of time.

## 2020-01-09 NOTE — Telephone Encounter (Signed)
Spoke with patient. Patient reports he was started on Aspirin by Dr. Roxy Manns his surgeon. He reports Dr. Roxy Manns usually has patient's on aspirin for about 3 months. He was told by Dr. Roxy Manns he could discontinue the Aspirin but to check with Dr. Oval Linsey before doing so. Will route to MD for review.

## 2020-01-09 NOTE — Progress Notes (Signed)
Daily Session Note  Patient Details  Name: Raymond Ray MRN: [id number] Date of Birth: 1950-03-25 Referring Provider:     [place] from 01/05/2020 in [place]  Referring Provider Raymond Latch, MD      Encounter Date: 01/09/2020  Check In:  Session Check In - 01/09/20 1050      Check-In   Supervising physician immediately available to respond to emergencies [facility] immediately available    Physician(s) Dr. Ree Kida    Location [facility]    Staff Present Barnet Pall, RN, Deland Pretty, MS, ACSM CEP, Exercise Physiologist;Portia Rollene Rotunda, RN, Milus Glazier, MS, EP-C, CCRP    Virtual Visit No    Medication changes reported     No    Fall or balance concerns reported    No    Tobacco Cessation No Change    Warm-up and Cool-down Performed on first and last piece of equipment    Resistance Training Performed Yes    VAD Patient? No    PAD/SET Patient? No      Pain Assessment   Currently in Pain? No/denies    Pain Score 0-No pain    Multiple Pain Sites No           Capillary Blood Glucose: No results found for this or any previous visit (from the past 24 hour(s)).   Exercise Prescription Changes - 01/09/20 1050      Response to Exercise   Blood Pressure (Admit) 98/52    Blood Pressure (Exercise) 124/80    Blood Pressure (Exit) 104/70    Heart Rate (Admit) 100 bpm    Heart Rate (Exercise) 126 bpm    Heart Rate (Exit) 100 bpm    Rating of Perceived Exertion (Exercise) 11    Symptoms none    Comments Off to a good start with exercise.    Duration Continue with 30 min of aerobic exercise without signs/symptoms of physical distress.    Intensity THRR unchanged      Progression   Progression Continue to progress workloads to maintain intensity without signs/symptoms of physical distress.    Average METs 2.1      Resistance Training   Training Prescription Yes     Weight 3 lbs    Reps 10-15    Time 10 Minutes      Interval Training   Interval Training No      Bike   Level 1.5    Minutes 15    METs 2      NuStep   Level 2    SPM 85    Minutes 15    METs 2.3           Social History   Tobacco Use  Smoking Status Never Smoker  Smokeless Tobacco Never Used    Goals Met:  Exercise tolerated well No report of cardiac concerns or symptoms Strength training completed today  Goals Unmet:  Not Applicable  Comments: Raymond Ray started cardiac rehab today.  Pt tolerated light exercise without difficulty. VSS, telemetry-Atrial Fibrillation, asymptomatic. Max heart rate 126 today Medication list reconciled. Pt denies barriers to medicaiton compliance.  PSYCHOSOCIAL ASSESSMENT:  PHQ-0. Pt exhibits positive coping skills, hopeful outlook with supportive family. No psychosocial needs identified at this time, no psychosocial interventions necessary.    Pt enjoys travelling, spending time with his family and doing yard work.   Pt oriented to exercise equipment and routine.    Understanding verbalized.Raymond Ray  Raymond Maxon, RN,BSN 01/10/2020 11:56 AM   Dr. Fransico Him is Medical Director for Cardiac Rehab at [facility].

## 2020-01-09 NOTE — Telephone Encounter (Signed)
OK to stop aspirin.  Also, based on more recent data we know that it is OK to use Eliquis after valve repair.  Stop aspirin and warfarin.  Resume Eliquis 5mg  bid.

## 2020-01-09 NOTE — Progress Notes (Signed)
[address]       ,[place] [address]             [phone]     CARDIOTHORACIC SURGERY OFFICE NOTE  Primary Cardiologist is [name], MD PCP is Crist Infante, MD   HPI:  Patient is a 69 year old male with longstanding history of mitral valve prolapse and permanent atrial fibrillation who returns to the office today for routine follow-up more than 3 months status post minimally invasive mitral valve repair with clipping of left atrial appendage on September 28, 2019 for severe symptomatic primary mitral regurgitation.  The patient's early postoperative recovery was uneventful and he was last seen here in our office on October 10, 2019 at which time he was doing well.  Shortly after that he was seen in follow-up by Dr. Oval Linsey and routine follow-up echocardiogram performed November 14, 2019 revealed intact mitral valve repair with trivial residual mitral regurgitation and preserved left ventricular systolic function with ejection fraction estimated 50 to 55%.  He has been participating in the cardiac rehab program and returns to our office today for routine follow-up.  He reports that he is doing very well.  He states that he had been walking twice a day consistently for a long time, but for the last few weeks he has had to cut back because he has been having increasing problems with pain in his left hip related to degenerative arthritis.  He recently had his left hip injected with steroids.  He states that his breathing is quite good and he has not had any shortness of breath other than he does notice a tendency to get slight shortness of breath when walking up a steep incline.  Overall he feels quite well.  He no longer has any residual pain or soreness in his chest and he is back to unrestricted physical activity with exception of his limitations related to his hip.  He remains anticoagulated using warfarin and low-dose aspirin at this time.   Current Outpatient  Medications  Medication Sig Dispense Refill  . ascorbic acid (VITAMIN C) 500 MG tablet Take 500 mg by mouth daily.     Marland Kitchen aspirin EC 81 MG EC tablet Take 1 tablet (81 mg total) by mouth daily. Swallow whole.    . metoprolol tartrate (LOPRESSOR) 25 MG tablet Take 0.5 tablets (12.5 mg total) by mouth 2 (two) times daily. 90 tablet 2  . Multiple Vitamins-Minerals (MULTIVITAMIN WITH MINERALS) tablet Take 1 tablet by mouth daily. [facility] men     . oxyCODONE-acetaminophen (PERCOCET) 10-325 MG tablet Take 1 tablet by mouth as needed for pain. Pt takes half tablet     . polyethylene glycol (MIRALAX / GLYCOLAX) 17 g packet Take 17 g by mouth daily.    . rosuvastatin (CRESTOR) 5 MG tablet Take 5 mg by mouth daily.    Marland Kitchen warfarin (COUMADIN) 5 MG tablet Take 1 tablet (5 mg total) by mouth daily at 4 PM. As directed by coumadin clinic 100 tablet 1  . zolpidem (AMBIEN CR) 12.5 MG CR tablet Take 12.5 mg by mouth at bedtime.     . ferrous [id number] C-folic acid (TRINSICON / FOLTRIN) capsule Take 1 capsule by mouth daily with breakfast. (Patient not taking: Reported on 01/03/2020) 30 capsule 1   No current facility-administered medications for this visit.      Physical Exam:   BP 125/62   Pulse 76   Temp 97.9 F (36.6 C) (Skin)  Resp 20   Ht 6\' 3"  (1.905 m)   Wt 174 lb (78.9 kg)   SpO2 98% Comment: RA  BMI 21.75 kg/m   General:  Well-appearing  Chest:   Clear to auscultation  CV:   Irregular rate and rhythm with no murmur appreciated  Incisions:  Completely healed  Abdomen:  Soft nontender  Extremities:  Warm and well-perfused  Diagnostic Tests:  ECHOCARDIOGRAM REPORT       Patient Name:  Raymond Ray Date of Exam: 11/14/2019  Medical Rec #: [id number]      Height:    75.0 in  Accession #:  [id number]     Weight:    172.0 lb  Date of Birth: 1950/08/15      BSA:     2.058 m  Patient Age:  58 years      BP:      114/62 mmHg  Patient  Gender: M          HR:      86 bpm.  Exam Location: [place]   Procedure: 2D Echo, Cardiac Doppler and Color Doppler   Indications:  Z98.890    History:    Patient has prior history of Echocardiogram examinations,  most         recent 09/29/2019. S/p Mitral valve repair;  Arrythmias:Atrial         Fibrillation.           Mitral Valve: prosthetic annuloplasty ring valve is  present in         the mitral position.    Sonographer:  Coralyn Helling RDCS  Referring Phys: [id number] TIFFANY [place]   IMPRESSIONS    1. Left ventricular ejection fraction, by estimation, is 50 to 55%. The  left ventricle has low normal function. The left ventricle has no regional  wall motion abnormalities. Left ventricular diastolic function could not  be evaluated.  2. Right ventricular systolic function is normal. The right ventricular  size is normal. There is normal pulmonary artery systolic pressure. The  estimated right ventricular systolic pressure is 59.5 mmHg.  3. Left atrial size was severely dilated.  4. Right atrial size was severely dilated.  5. The mitral valve is myxomatous. Trivial mitral valve regurgitation. No  evidence of mitral stenosis. The mean mitral valve gradient is 2.7 mmHg  with average heart rate of 65 bpm. There is a prosthetic annuloplasty ring  present in the mitral position.  6. The aortic valve is tricuspid. Aortic valve regurgitation is not  visualized. No aortic stenosis is present.  7. Aortic dilatation noted. There is mild dilatation of the aortic root,  measuring 41 mm. There is mild dilatation of the ascending aorta,  measuring 39 mm.   Comparison(s): Prior images reviewed side by side. The mitral valve has  been successfully repaired. The left ventricular systolic function appears  mildly reduced compared to the preoperative study (although reported LVEF  is the same).   FINDINGS    Left Ventricle: Left ventricular ejection fraction, by estimation, is 50  to 55%. The left ventricle has low normal function. The left ventricle has  no regional wall motion abnormalities. The left ventricular internal  cavity size was normal in size.  There is no concentric left ventricular hypertrophy. Abnormal  (paradoxical) septal motion consistent with post-operative status. Left  ventricular diastolic function could not be evaluated due to atrial  fibrillation. Left ventricular diastolic function  could not be evaluated.   Right Ventricle: The right ventricular  size is normal. No increase in  right ventricular wall thickness. Right ventricular systolic function is  normal. There is normal pulmonary artery systolic pressure. The tricuspid  regurgitant velocity is 2.61 m/s, and  with an assumed right atrial pressure of 3 mmHg, the estimated right  ventricular systolic pressure is 08.1 mmHg.   Left Atrium: Left atrial size was severely dilated.   Right Atrium: Right atrial size was severely dilated.   Pericardium: There is no evidence of pericardial effusion.   Mitral Valve: Annuloplasty ring is well seatedand there are surgical  neochordae seen. The mitral valve is myxomatous. There is moderate  thickening of the mitral valve leaflet(s). Trivial mitral valve  regurgitation. There is a prosthetic annuloplasty ring  present in the mitral position. No evidence of mitral valve stenosis. The  mean mitral valve gradient is 2.7 mmHg with average heart rate of 65 bpm.   Tricuspid Valve: The tricuspid valve is normal in structure. Tricuspid  valve regurgitation is mild.   Aortic Valve: The aortic valve is tricuspid. Aortic valve regurgitation is  not visualized. No aortic stenosis is present.   Pulmonic Valve: The pulmonic valve was normal in structure. Pulmonic valve  regurgitation is trivial.   Aorta: Aortic dilatation noted. There is mild dilatation of the aortic  root,  measuring 41 mm. There is mild dilatation of the ascending aorta,  measuring 39 mm.   IAS/Shunts: No atrial level shunt detected by color flow Doppler.     LEFT VENTRICLE  PLAX 2D  LVIDd:     4.20 cm Diastology  LVIDs:     2.84 cm LV e' medial:  7.44 cm/s  LV PW:     0.90 cm LV E/e' medial: 22.4  LV IVS:    1.10 cm LV e' lateral:  12.66 cm/s  LVOT diam:   2.14 cm LV E/e' lateral: 13.1  LV SV:     62  LV SV Index:  30  LVOT Area:   3.60 cm     RIGHT VENTRICLE  RVSP:      30.2 mmHg   LEFT ATRIUM       Index    RIGHT ATRIUM      Index  LA diam:    4.90 cm 2.38 cm/m RA Pressure: 3.00 mmHg  LA Vol (A2C):  103.0 ml 50.05 ml/m RA Area:   31.30 cm  LA Vol (A4C):  110.0 ml 53.45 ml/m RA Volume:  112.00 ml 54.42 ml/m  LA Biplane Vol: 107.0 ml 51.99 ml/m  AORTIC VALVE  LVOT Vmax:  92.96 cm/s  LVOT Vmean: 60.020 cm/s  LVOT VTI:  0.173 m    AORTA  Ao Root diam: 4.10 cm  Ao Asc diam: 3.90 cm   MITRAL VALVE        TRICUSPID VALVE  MV Area (PHT): 3.42 cm   TR Peak grad:  27.2 mmHg  MV Mean grad: 2.7 mmHg   TR Vmax:    261.00 cm/s  MV PHT:    64.24 msec  Estimated RAP: 3.00 mmHg  MV Decel Time: 222 msec   RVSP:      30.2 mmHg  MV E velocity: 166.40 cm/s               SHUNTS               Systemic VTI: 0.17 m               Systemic Diam: 2.14 cm   Mihai Croitoru  MD  Electronically signed by Sanda Klein MD  Signature Date/Time: 11/15/2019/8:37:10 AM      Impression:  Patient is doing very well more than 3 months status post minimally invasive mitral valve repair with clipping of left atrial appendage.     Plan:  We have not recommended any changes to the patient's current medications.  However, I suggested that the patient talk further with Dr. Oval Linsey whether or not he should remain on long-term warfarin  anticoagulation or switch back to Eliquis.  Low-dose aspirin probably could be stopped.  I have encouraged the patient to continue to increase his physical activity as tolerated without any particular limitations.  For that matter if he needs surgical intervention for management of his left hip he could probably proceed at any time.  The patient has been reminded regarding the importance of dental hygiene and the lifelong need for antibiotic prophylaxis for all dental cleanings and other related invasive procedures.  The patient will continue to follow-up regularly with Dr. Oval Linsey and return to our office next summer for routine follow-up approximately 1 year following his surgery.  During the interim he will call and return only should specific problems or questions arise.    I spent in excess of 15 minutes during the conduct of this office consultation and >50% of this time involved direct face-to-face encounter with the patient for counseling and/or coordination of their care.    Valentina Gu. Roxy Manns, MD 01/09/2020 3:02 PM

## 2020-01-09 NOTE — Telephone Encounter (Signed)
New Message:     Pt saw Dr Halford Chessman, his surgeon today and he said he could stop taking the Aspirin. He told him to see what Dr Oval Linsey said about this, since she was his Cardiologist.

## 2020-01-10 DIAGNOSIS — E785 Hyperlipidemia, unspecified: Secondary | ICD-10-CM | POA: Diagnosis not present

## 2020-01-10 NOTE — Telephone Encounter (Signed)
Spoke with patient, he is getting ready to go on a trip. He will keep his 12/10 INR appointment and make the change then He will stop ASA

## 2020-01-11 ENCOUNTER — Other Ambulatory Visit: Payer: Self-pay

## 2020-01-11 ENCOUNTER — Encounter (HOSPITAL_COMMUNITY)
Admission: RE | Admit: 2020-01-11 | Discharge: 2020-01-11 | Disposition: A | Discharge status: Home / Self Care | Payer: Medicare Other | Source: Ambulatory Visit | Attending: Cardiovascular Disease | Admitting: Cardiovascular Disease

## 2020-01-11 ENCOUNTER — Ambulatory Visit (HOSPITAL_COMMUNITY): Payer: Medicare Other

## 2020-01-11 DIAGNOSIS — Z9889 Other specified postprocedural states: Secondary | ICD-10-CM

## 2020-01-13 ENCOUNTER — Encounter (HOSPITAL_COMMUNITY): Payer: Medicare Other

## 2020-01-13 ENCOUNTER — Ambulatory Visit (HOSPITAL_COMMUNITY): Payer: Medicare Other

## 2020-01-16 ENCOUNTER — Encounter (HOSPITAL_COMMUNITY): Payer: Medicare Other

## 2020-01-16 ENCOUNTER — Ambulatory Visit (HOSPITAL_COMMUNITY): Payer: Medicare Other

## 2020-01-17 ENCOUNTER — Encounter (HOSPITAL_COMMUNITY): Payer: Self-pay | Admitting: *Deleted

## 2020-01-17 DIAGNOSIS — M25552 Pain in left hip: Secondary | ICD-10-CM | POA: Diagnosis not present

## 2020-01-17 DIAGNOSIS — Z9889 Other specified postprocedural states: Secondary | ICD-10-CM

## 2020-01-17 NOTE — Progress Notes (Signed)
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  ascorbic acid (VITAMIN C) 500 MG tablet, Take 500 mg by mouth daily. , Disp: , Rfl:  .  ferrous [id number] C-folic acid (TRINSICON / FOLTRIN) capsule, Take 1 capsule by mouth daily with breakfast. (Patient not taking: Reported on 01/03/2020), Disp: 30 capsule, Rfl: 1 .  metoprolol tartrate (LOPRESSOR) 25 MG tablet, Take 0.5 tablets (12.5 mg total) by mouth 2 (two) times daily., Disp: 90 tablet, Rfl: 2 .  Multiple Vitamins-Minerals (MULTIVITAMIN WITH MINERALS) tablet, Take 1 tablet by mouth daily. [facility] men , Disp: , Rfl:  .  oxyCODONE-acetaminophen (PERCOCET) 10-325 MG tablet, Take 1 tablet by mouth as needed for pain. Pt takes half tablet , Disp: , Rfl:  .  polyethylene glycol (MIRALAX / GLYCOLAX) 17 g packet, Take 17 g by mouth daily., Disp: , Rfl:  .  rosuvastatin (CRESTOR) 5 MG tablet, Take 5 mg by mouth daily., Disp: , Rfl:  .  warfarin (COUMADIN) 5 MG tablet, Take 1 tablet (5 mg total) by mouth daily at 4 PM. As directed by coumadin clinic, Disp: 100 tablet, Rfl: 1 .  zolpidem (AMBIEN CR) 12.5 MG CR tablet, Take 12.5 mg by mouth at bedtime. , Disp: , Rfl:   Past Medical History: Past Medical History:  Diagnosis Date  . Allergy   . Anemia   . Chronic atrial fibrillation ([place])   . Heart murmur   . History of colonoscopy 04/2001   negative  . Mitral valve prolapse   . Nonrheumatic mitral (valve)  insufficiency   . Osteoarthritis of hip   . Permanent atrial fibrillation ([place])   . PONV (postoperative nausea and vomiting)    left knee cartilage removal;    . Prostate cancer ([place])   . 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 Tricuspid regurgitation   . Vertigo 01/07/2017   currently not symptomatic     Tobacco Use: Social History   Tobacco Use  Smoking Status Never Smoker  Smokeless Tobacco Never Used    Labs: Recent Review Flowsheet Data    Labs for ITP Cardiac and Pulmonary Rehab Latest Ref Rng & Units 09/28/2019 09/28/2019 09/28/2019 09/28/2019 09/28/2019   Cholestrol 0 - 200 mg/dL - - - - -   LDLCALC 0 - 99 mg/dL - - - - -   HDL >39.00 mg/dL - - - - -   Trlycerides 0 - 149 mg/dL - - - - -   Hemoglobin A1c 4.8 - 5.6 % - - - - -   PHART 7.35 - 7.45 7.449 - 7.421 - 7.310(L)   PCO2ART 32 - 48 mmHg 33.9 - 34.5 - 45.4   HCO3 20.0 - 28.0 mmol/L 23.5 - 22.4 - 23.1   TCO2 22 - 32 mmol/L 25 24 23  21(L) 25   ACIDBASEDEF 0.0 - 2.0 mmol/L - -  2.0 - 3.0(H)   O2SAT % 100.0 - 100.0 - 96.0      Capillary Blood Glucose: Lab Results  Component Value Date   GLUCAP 103 (H) 09/29/2019   GLUCAP 135 (H) 09/29/2019   GLUCAP 142 (H) 09/29/2019   GLUCAP 130 (H) 09/29/2019   GLUCAP 156 (H) 09/29/2019     Exercise Target Goals: Exercise Program Goal: Individual exercise prescription set using results from initial 6 min walk test and THRR while considering  patient's activity barriers and safety.   Exercise Prescription Goal: Starting with aerobic activity 30 plus minutes a day, 3 days per week for initial exercise prescription. Provide home exercise prescription and guidelines that participant acknowledges understanding prior to discharge.  Activity Barriers & Risk Stratification:  Activity Barriers & Cardiac Risk  Stratification - 01/05/20 1228      Activity Barriers & Cardiac Risk Stratification   Activity Barriers Right Hip Replacement;Other (comment);Deconditioning    Comments Bilateral neuropathy of the feet    Cardiac Risk Stratification High           6 Minute Walk:  6 Minute Walk    Row Name 01/05/20 1035         6 Minute Walk   Phase Initial     Distance 800 feet     Walk Time 6 minutes     # of Rest Breaks 1  Test stopped at 3:03 due to HR of 171 bpm     MPH 1.5     METS 3.46     RPE 11     Perceived Dyspnea  0     VO2 Peak 12.11     Symptoms Yes (comment)     Comments left hip pain (5 out of 10)     Resting HR 88 bpm     Resting BP 122/74     Resting Oxygen Saturation  99 %     Exercise Oxygen Saturation  during 6 min walk 99 %     Max Ex. HR 171 bpm     Max Ex. BP 136/80     2 Minute Post BP 118/66            Oxygen Initial Assessment:   Oxygen Re-Evaluation:   Oxygen Discharge (Final Oxygen Re-Evaluation):   Initial Exercise Prescription:  Initial Exercise Prescription - 01/05/20 1200      Date of Initial Exercise RX and Referring Provider   Date 01/05/20    Referring Provider [name], MD    Expected Discharge Date 03/02/20      Bike   Level 1.5    Minutes 15    METs 2.4      NuStep   Level 2    SPM 85    Minutes 15    METs 2.2      Prescription Details   Frequency (times per week) 3    Duration Progress to 30 minutes of continuous aerobic without signs/symptoms of physical distress      Intensity   THRR 40-80% of Max Heartrate 61-122    Ratings of Perceived Exertion 11-13    Perceived Dyspnea 0-4      Progression   Progression Continue progressive overload as per policy without signs/symptoms or physical distress.      Resistance Training   Training Prescription Yes    Weight 3 lbs    Reps 10-15           Perform Capillary Blood Glucose checks as needed.  Exercise  Prescription Changes:  Exercise Prescription  Changes    Row Name 01/09/20 1050 01/11/20 1050           Response to Exercise   Blood Pressure (Admit) 98/52 100/60      Blood Pressure (Exercise) 124/80 124/78      Blood Pressure (Exit) 104/70 108/60      Heart Rate (Admit) 100 bpm 98 bpm      Heart Rate (Exercise) 126 bpm 134 bpm      Heart Rate (Exit) 100 bpm 97 bpm      Rating of Perceived Exertion (Exercise) 11 10      Symptoms none none      Comments Off to a good start with exercise. --      Duration Continue with 30 min of aerobic exercise without signs/symptoms of physical distress. Continue with 30 min of aerobic exercise without signs/symptoms of physical distress.      Intensity THRR unchanged THRR unchanged        Progression   Progression Continue to progress workloads to maintain intensity without signs/symptoms of physical distress. Continue to progress workloads to maintain intensity without signs/symptoms of physical distress.      Average METs 2.1 3.2        Resistance Training   Training Prescription Yes No      Weight 3 lbs --      Reps 10-15 --      Time 10 Minutes --        Interval Training   Interval Training No No        Bike   Level 1.5 2      Minutes 15 15      METs 2 3.7        NuStep   Level 2 2      SPM 85 85      Minutes 15 15      METs 2.3 2.8             Exercise Comments:  Exercise Comments    Row Name 01/09/20 1147           Exercise Comments Patient tolerated 1st session of exercise well without symptoms.              Exercise Goals and Review:  Exercise Goals    Row Name 01/05/20 1232             Exercise Goals   Increase Physical Activity Yes       Intervention Provide advice, education, support and counseling about physical activity/exercise needs.;Develop an individualized exercise prescription for aerobic and resistive training based on initial evaluation findings, risk stratification, comorbidities and participant's personal goals.       Expected  Outcomes Short Term: Attend rehab on a regular basis to increase amount of physical activity.;Long Term: Add in home exercise to make exercise part of routine and to increase amount of physical activity.;Long Term: Exercising regularly at least 3-5 days a week.       Increase Strength and Stamina Yes       Intervention Provide advice, education, support and counseling about physical activity/exercise needs.;Develop an individualized exercise prescription for aerobic and resistive training based on initial evaluation findings, risk stratification, comorbidities and participant's personal goals.       Expected Outcomes Short Term: Perform resistance training exercises routinely during rehab and add in resistance training at home;Short Term: Increase workloads from initial exercise prescription for resistance, speed, and METs.;Long Term: Improve cardiorespiratory  fitness, muscular endurance and strength as measured by increased METs and functional capacity (6MWT)       Able to understand and use rate of perceived exertion (RPE) scale Yes       Intervention Provide education and explanation on how to use RPE scale       Expected Outcomes Short Term: Able to use RPE daily in rehab to express subjective intensity level;Long Term:  Able to use RPE to guide intensity level when exercising independently       Knowledge and understanding of Target Heart Rate Range (THRR) Yes       Intervention Provide education and explanation of THRR including how the numbers were predicted and where they are located for reference       Expected Outcomes Short Term: Able to state/look up THRR;Short Term: Able to use daily as guideline for intensity in rehab;Long Term: Able to use THRR to govern intensity when exercising independently       Understanding of Exercise Prescription Yes       Intervention Provide education, explanation, and written materials on patient's individual exercise prescription       Expected Outcomes Short  Term: Able to explain program exercise prescription;Long Term: Able to explain home exercise prescription to exercise independently              Exercise Goals Re-Evaluation :  Exercise Goals Re-Evaluation    [place] Name 01/09/20 1147             Exercise Goal Re-Evaluation   Exercise Goals Review Increase Physical Activity;Able to understand and use rate of perceived exertion (RPE) scale       Comments Patient able to understand and use RPE scale appropriately.       Expected Outcomes Progress workloads as tolerated to help increase strength and stamina.               Discharge Exercise Prescription (Final Exercise Prescription Changes):  Exercise Prescription Changes - 01/11/20 1050      Response to Exercise   Blood Pressure (Admit) 100/60    Blood Pressure (Exercise) 124/78    Blood Pressure (Exit) 108/60    Heart Rate (Admit) 98 bpm    Heart Rate (Exercise) 134 bpm    Heart Rate (Exit) 97 bpm    Rating of Perceived Exertion (Exercise) 10    Symptoms none    Duration Continue with 30 min of aerobic exercise without signs/symptoms of physical distress.    Intensity THRR unchanged      Progression   Progression Continue to progress workloads to maintain intensity without signs/symptoms of physical distress.    Average METs 3.2      Resistance Training   Training Prescription No      Interval Training   Interval Training No      Bike   Level 2    Minutes 15    METs 3.7      NuStep   Level 2    SPM 85    Minutes 15    METs 2.8           Nutrition:  Target Goals: Understanding of nutrition guidelines, daily intake of sodium 1500mg , cholesterol 200mg , calories 30% from fat and 7% or less from saturated fats, daily to have 5 or more servings of fruits and vegetables.  Biometrics:  Pre Biometrics - 01/05/20 1000      Pre Biometrics   Waist Circumference 37 inches    Hip Circumference 42  inches    Waist to Hip Ratio 0.88 %    Triceps Skinfold 8 mm     % Body Fat 20.9 %    Grip Strength 33 kg    Flexibility 11 in    Single Leg Stand 26.3 seconds            Nutrition Therapy Plan and Nutrition Goals:  Nutrition Therapy & Goals - 01/13/20 1134      Nutrition Therapy   RD appointment deferred Yes           Nutrition Assessments:  MEDIFICTS Score Key:  ?70 Need to make dietary changes   40-70 Heart Healthy Diet  ? 40 Therapeutic Level Cholesterol Diet   Picture Your Plate Scores:  <72 Unhealthy dietary pattern with much room for improvement.  41-50 Dietary pattern unlikely to meet recommendations for good health and room for improvement.  51-60 More healthful dietary pattern, with some room for improvement.   >60 Healthy dietary pattern, although there may be some specific behaviors that could be improved.    Nutrition Goals Re-Evaluation:   Nutrition Goals Discharge (Final Nutrition Goals Re-Evaluation):   Psychosocial: Target Goals: Acknowledge presence or absence of significant depression and/or stress, maximize coping skills, provide positive support system. Participant is able to verbalize types and ability to use techniques and skills needed for reducing stress and depression.  Initial Review & Psychosocial Screening:  Initial Psych Review & Screening - 01/05/20 1329      Initial Review   Current issues with None Identified      Family Dynamics   Good Support System? Yes   Timmothy Sours has his wife and children for support     Barriers   Psychosocial barriers to participate in program There are no identifiable barriers or psychosocial needs.      Screening Interventions   Interventions Encouraged to exercise           Quality of Life Scores:  Quality of Life - 01/05/20 1223      Quality of Life   Select Quality of Life      Quality of Life Scores   Health/Function Pre 26.47 %    Socioeconomic Pre 25.36 %    Psych/Spiritual Pre 28.29 %    Family Pre 28.8 %    GLOBAL Pre 24.31 %           Scores of 19 and below usually indicate a poorer quality of life in these areas.  A difference of  2-3 points is a clinically meaningful difference.  A difference of 2-3 points in the total score of the Quality of Life Index has been associated with significant improvement in overall quality of life, self-image, physical symptoms, and general health in studies assessing change in quality of life.  PHQ-9: Recent Review Flowsheet Data    Depression screen [facility] 2/9 01/05/2020   Decreased Interest 0   Down, Depressed, Hopeless 0   PHQ - 2 Score 0     Interpretation of Total Score  Total Score Depression Severity:  1-4 = Minimal depression, 5-9 = Mild depression, 10-14 = Moderate depression, 15-19 = Moderately severe depression, 20-27 = Severe depression   Psychosocial Evaluation and Intervention:   Psychosocial Re-Evaluation:  Psychosocial Re-Evaluation    [place] Name 01/10/20 1159             Psychosocial Re-Evaluation   Current issues with None Identified       Interventions Encouraged to attend Cardiac Rehabilitation for the exercise  Continue Psychosocial Services  No Follow up required              Psychosocial Discharge (Final Psychosocial Re-Evaluation):  Psychosocial Re-Evaluation - 01/10/20 1159      Psychosocial Re-Evaluation   Current issues with None Identified    Interventions Encouraged to attend Cardiac Rehabilitation for the exercise    Continue Psychosocial Services  No Follow up required           Vocational Rehabilitation: Provide vocational rehab assistance to qualifying candidates.   Vocational Rehab Evaluation & Intervention:  Vocational Rehab - 01/05/20 1331      Initial Vocational Rehab Evaluation & Intervention   Assessment shows need for Vocational Rehabilitation No   Timmothy Sours recently retired and does not need vocational rehab at this time          Education: Education Goals: Education classes will be provided on a weekly basis,  covering required topics. Participant will state understanding/return demonstration of topics presented.  Learning Barriers/Preferences:  Learning Barriers/Preferences - 01/05/20 1224      Learning Barriers/Preferences   Learning Barriers None    Learning Preferences None           Education Topics: Hypertension, Hypertension Reduction -Define heart disease and high blood pressure. Discus how high blood pressure affects the body and ways to reduce high blood pressure.   Exercise and Your Heart -Discuss why it is important to exercise, the FITT principles of exercise, normal and abnormal responses to exercise, and how to exercise safely.   Angina -Discuss definition of angina, causes of angina, treatment of angina, and how to decrease risk of having angina.   Cardiac Medications -Review what the following cardiac medications are used for, how they affect the body, and side effects that may occur when taking the medications.  Medications include Aspirin, Beta blockers, calcium channel blockers, ACE Inhibitors, angiotensin receptor blockers, diuretics, digoxin, and antihyperlipidemics.   Congestive Heart Failure -Discuss the definition of CHF, how to live with CHF, the signs and symptoms of CHF, and how keep track of weight and sodium intake.   Heart Disease and Intimacy -Discus the effect sexual activity has on the heart, how changes occur during intimacy as we age, and safety during sexual activity.   Smoking Cessation / COPD -Discuss different methods to quit smoking, the health benefits of quitting smoking, and the definition of COPD.   Nutrition I: Fats -Discuss the types of cholesterol, what cholesterol does to the heart, and how cholesterol levels can be controlled.   Nutrition II: Labels -Discuss the different components of food labels and how to read food label   Heart Parts/Heart Disease and PAD -Discuss the anatomy of the heart, the pathway of blood  circulation through the heart, and these are affected by heart disease.   Stress I: Signs and Symptoms -Discuss the causes of stress, how stress may lead to anxiety and depression, and ways to limit stress.   Stress II: Relaxation -Discuss different types of relaxation techniques to limit stress.   Warning Signs of Stroke / TIA -Discuss definition of a stroke, what the signs and symptoms are of a stroke, and how to identify when someone is having stroke.   Knowledge Questionnaire Score:  Knowledge Questionnaire Score - 01/05/20 1225      Knowledge Questionnaire Score   Pre Score 21/24           Core Components/Risk Factors/Patient Goals at Admission:  Personal Goals and Risk Factors at Admission - 01/05/20  1331      Core Components/Risk Factors/Patient Goals on Admission    Weight Management Weight Maintenance;Yes    Intervention Weight Management: Develop a combined nutrition and exercise program designed to reach desired caloric intake, while maintaining appropriate intake of nutrient and fiber, sodium and fats, and appropriate energy expenditure required for the weight goal.;Weight Management: Provide education and appropriate resources to help participant work on and attain dietary goals.    Expected Outcomes Short Term: Continue to assess and modify interventions until short term weight is achieved;Long Term: Adherence to nutrition and physical activity/exercise program aimed toward attainment of established weight goal;Weight Maintenance: Understanding of the daily nutrition guidelines, which includes 25-35% calories from fat, 7% or less cal from saturated fats, less than 200mg  cholesterol, less than 1.5gm of sodium, & 5 or more servings of fruits and vegetables daily;Understanding recommendations for meals to include 15-35% energy as protein, 25-35% energy from fat, 35-60% energy from carbohydrates, less than 200mg  of dietary cholesterol, 20-35 gm of total fiber  daily;Understanding of distribution of calorie intake throughout the day with the consumption of 4-5 meals/snacks    Lipids Yes    Intervention Provide education and support for participant on nutrition & aerobic/resistive exercise along with prescribed medications to achieve LDL 70mg , HDL >40mg .    Expected Outcomes Short Term: Participant states understanding of desired cholesterol values and is compliant with medications prescribed. Participant is following exercise prescription and nutrition guidelines.;Long Term: Cholesterol controlled with medications as prescribed, with individualized exercise RX and with personalized nutrition plan. Value goals: LDL < 70mg , HDL > 40 mg.           Core Components/Risk Factors/Patient Goals Review:   Goals and Risk Factor Review    Row Name 01/10/20 1200             Core Components/Risk Factors/Patient Goals Review   Personal Goals Review Weight Management/Obesity;Lipids       Review Timmothy Sours started exercise on 01/09/20 and did well with exercise       Expected Outcomes Timmothy Sours will continue to participate in phase 2 cardiac rehab for exercise, nutrition and lifestyle modifications              Core Components/Risk Factors/Patient Goals at Discharge (Final Review):   Goals and Risk Factor Review - 01/10/20 1200      Core Components/Risk Factors/Patient Goals Review   Personal Goals Review Weight Management/Obesity;Lipids    Review Timmothy Sours started exercise on 01/09/20 and did well with exercise    Expected Outcomes Timmothy Sours will continue to participate in phase 2 cardiac rehab for exercise, nutrition and lifestyle modifications           ITP Comments:  ITP Comments    Row Name 01/05/20 1324 01/10/20 1157 01/17/20 1153       ITP Comments Dr Fransico Him MD, Medical Director 30 Day ITP Review. Don started exercise on 01/09/20 and did well with exercise 30 Day ITP Review. Don started exercise on 01/09/20 and did well with exercise             Comments: See ITP comments. Timmothy Sours is currently on vacation and will return to cardiac rehab next week.Barnet Pall, RN,BSN 01/17/2020 11:56 AM

## 2020-01-18 ENCOUNTER — Ambulatory Visit (HOSPITAL_COMMUNITY): Payer: Medicare Other

## 2020-01-18 ENCOUNTER — Encounter (HOSPITAL_COMMUNITY): Payer: Medicare Other

## 2020-01-20 ENCOUNTER — Ambulatory Visit (HOSPITAL_COMMUNITY): Payer: Medicare Other

## 2020-01-20 ENCOUNTER — Encounter (HOSPITAL_COMMUNITY): Payer: Medicare Other

## 2020-01-23 ENCOUNTER — Ambulatory Visit (HOSPITAL_COMMUNITY): Payer: Medicare Other

## 2020-01-23 ENCOUNTER — Other Ambulatory Visit: Payer: Self-pay

## 2020-01-23 ENCOUNTER — Encounter (HOSPITAL_COMMUNITY)
Admission: RE | Admit: 2020-01-23 | Discharge: 2020-01-23 | Disposition: A | Discharge status: Home / Self Care | Payer: Medicare Other | Source: Ambulatory Visit | Attending: Cardiovascular Disease | Admitting: Cardiovascular Disease

## 2020-01-23 DIAGNOSIS — Z9889 Other specified postprocedural states: Secondary | ICD-10-CM

## 2020-01-23 NOTE — Progress Notes (Signed)
Raymond Ray 69 y.o. 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 Allergy   . Anemia   . Chronic atrial fibrillation ([place])   . Heart murmur   . History of colonoscopy 04/2001   negative  . Mitral valve prolapse   . Nonrheumatic mitral (valve) insufficiency   . Osteoarthritis of hip   . Permanent atrial fibrillation ([place])   . PONV (postoperative nausea and vomiting)    left knee cartilage removal;    . Prostate cancer ([place])   . 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 Tricuspid regurgitation   . Vertigo 01/07/2017   currently not symptomatic      Medications reviewed.   Current Outpatient Medications:  .  ascorbic acid (VITAMIN C) 500 MG tablet, Take 500 mg by mouth daily. , Disp: , Rfl:  .  ferrous [id number] C-folic acid (TRINSICON / FOLTRIN) capsule, Take 1 capsule by mouth daily with breakfast. (Patient not taking: Reported on 01/03/2020), Disp: 30 capsule, Rfl: 1 .  metoprolol tartrate (LOPRESSOR) 25 MG tablet, Take 0.5 tablets (12.5 mg total) by mouth 2 (two) times daily., Disp: 90 tablet, Rfl: 2 .  Multiple Vitamins-Minerals (MULTIVITAMIN WITH MINERALS) tablet, Take 1 tablet by mouth daily. [facility] men , Disp: , Rfl:  .  oxyCODONE-acetaminophen (PERCOCET) 10-325 MG tablet, Take 1 tablet by mouth as needed for pain. Pt takes half tablet , Disp: , Rfl:  .  polyethylene glycol (MIRALAX / GLYCOLAX) 17 g packet, Take 17 g by mouth daily., Disp: , Rfl:  .  rosuvastatin (CRESTOR) 5 MG tablet, Take 5 mg by mouth daily., Disp: , Rfl:  .  warfarin (COUMADIN) 5 MG tablet, Take 1 tablet (5 mg total) by mouth daily at 4 PM. As directed by coumadin clinic, Disp: 100 tablet, Rfl: 1 .  zolpidem (AMBIEN CR) 12.5 MG CR  tablet, Take 12.5 mg by mouth at bedtime. , Disp: , Rfl:    Ht Readings from Last 1 Encounters:  01/09/20 6\' 3"  (1.905 m)     Wt Readings from Last 3 Encounters:  01/09/20 174 lb (78.9 kg)  01/05/20 174 lb 2.6 oz (79 kg)  10/14/19 172 lb (78 kg)     There is no height or weight on file to calculate BMI.   Social History   Tobacco Use  Smoking Status Never Smoker  Smokeless Tobacco Never Used     Lab Results  Component Value Date   CHOL 166 03/03/2011   Lab Results  Component Value Date   HDL 55.80 03/03/2011   Lab Results  Component Value Date   LDLCALC 102 (H) 03/03/2011   Lab Results  Component Value Date   TRIG 41.0 03/03/2011     Lab Results  Component Value Date   HGBA1C 5.7 (H) 09/26/2019     CBG (last 3)  No results for input(s): GLUCAP in the last 72 hours.   Nutrition Note  Spoke with pt. Nutrition Plan and Nutrition Survey goals reviewed with pt. Pt is trying to follow a Heart Healthy diet. He takes coumadin but is switching back to eliquis in December. Reviewed consistent vitamin k intake.  He avoids grapefruits/grapefruit juice d/t statin. Lost 10 lbs after hospital stay. Has gained 8 lbs back. He would be fine to gain a few  more lbs.  Normal appetite.  He eats breakfast, 1 main meal, and snacks.  He works out with a Clinical research associate. He has focused on increasing protein by incorporating greek yogurt, milk, chicken, eggs, nuts, etc.  He does not read labels, eats out >4 times per week, limited cooking at home d/t time constraints, and uses salt shaker to season foods.  Recommended simple heart healthy changes. Pt lead goal setting and feels he is happy to make simple lifestyle changes.  Pt expressed understanding of the information reviewed. .   Nutrition Diagnosis ? Food-and nutrition-related knowledge deficit related to lack of exposure to information as related to diagnosis of: ? CVD   Nutrition Intervention ? Pt's individual nutrition plan  reviewed with pt. ? Therapeutic lifestyle changes. Incorporate nuts, whole grains, and fruits/veggies daily.  ? Benefits of adopting Heart Healthy diet discussed when [employer] reviewed. ? Continue client-centered nutrition education by RD, as part of interdisciplinary care.  Goal(s) ? Pt to build a healthy plate including vegetables, fruits, whole grains, and low-fat dairy products in a heart healthy meal plan. ? Pt to choose handful of nuts daily  Plan:   Will provide client-centered nutrition education as part of interdisciplinary care  Monitor and evaluate progress toward nutrition goal with team.   Michaele Offer, MS, RDN, LDN

## 2020-01-25 ENCOUNTER — Ambulatory Visit (HOSPITAL_COMMUNITY): Payer: Medicare Other

## 2020-01-25 ENCOUNTER — Encounter (HOSPITAL_COMMUNITY): Payer: Medicare Other

## 2020-01-27 ENCOUNTER — Encounter (HOSPITAL_COMMUNITY)
Admission: RE | Admit: 2020-01-27 | Discharge: 2020-01-27 | Disposition: A | Discharge status: Home / Self Care | Payer: Medicare Other | Source: Ambulatory Visit | Attending: Cardiovascular Disease | Admitting: Cardiovascular Disease

## 2020-01-27 ENCOUNTER — Other Ambulatory Visit: Payer: Self-pay

## 2020-01-27 ENCOUNTER — Telehealth: Payer: Self-pay | Admitting: Cardiovascular Disease

## 2020-01-27 ENCOUNTER — Ambulatory Visit (HOSPITAL_COMMUNITY): Payer: Medicare Other

## 2020-01-27 VITALS — BP 124/80 | HR 98 | Wt 170.9 lb

## 2020-01-27 DIAGNOSIS — Z9889 Other specified postprocedural states: Secondary | ICD-10-CM | POA: Diagnosis not present

## 2020-01-27 NOTE — Telephone Encounter (Signed)
STAT if HR is under 50 or over 120 (normal HR is 60-100 beats per minute)  1) What is your heart rate? 96- most recent reading, per [name] with Cardiac Rehab  2) Do you have a log of your heart rate readings (document readings)?  01/27/20: 98 when the patient arrived at rehab 166 was his maximum HR while on bike 96 when leaving their office  3) Do you have any other symptoms? No   [name] with [facility] called requesting to speak with Dr. Blenda Mounts nurse. I made her aware that Dr. Lynnell Catalan are not in today and she requested a triage nurse. She states the patient's heart rate is over target rate and she will be sending in strips for review.   Phone#: [phone]

## 2020-01-27 NOTE — Progress Notes (Signed)
Don returned to exercise at cardiac rehab. Patient exceeded his target several times today. Patient weights were removed work loads were decreased. Telemetry rhythm was noted as high as 175 non sustained chronic atrial fibrillation after reviewing ECG tracings. . . Systolic blood pressure ranged from 670-141 systolic. Diastolic blood pressures 03-01. Patient asymptomatic. Patient taken to the treatment room post exercise. Feet elevated heart came down to upper 80's low 90's with rest. Timmothy Sours said that he started working with a trainer this week and did not monitor his heart rate. Dr Blenda Mounts office called and notified about today's continued exertional heart rate elevations. Medications reviewed. Today's ECG tracings were faxed to Dr Blenda Mounts office for review.Barnet Pall, RN,BSN 01/27/2020 2:03 PM

## 2020-01-27 NOTE — Telephone Encounter (Signed)
° ° °  Pt is calling, he said that for the past 3 weeks every time he goes to cardiac rehab he gets shut down because his HR beats really fast. He really don't know what to do next and would like to speak with Dr. Blenda Mounts nurse

## 2020-01-27 NOTE — Telephone Encounter (Signed)
Called and spoke with [name]. She expressed concern about patient's heart rate during cardiac rehab. Patient has permanent atrial fibrillation since his 20's. Per her notes, heart rates in the 90's when he comes in and very quickly shoots up with exercise. Rates got as high as the 160's to 170's today. Patient asymptomatic with this. Exercise was stopped early and rates improved to 80's to 90's prior to him leaving. Maria faxed strips over to office. I am working remotely today so unable to view these. [name] would just like to have recommendations moving forward.   Also called and spoke with patient. He is getting frustrated because for the last 3 weeks his cardiac rehab has been cut short due to his heart rate. He is a very active guy and states that when they stop him, he does not even feel like he is breaking a sweat. He does not really want to continue with cardiac rehab at this point. He is also concerned about being told his heart rate is elevated and wants to make sure nothing is wrong with his heart. He states it felt like his heart rate took a little longer to return to normal today after exercising but is otherwise completely asymptomatic. I tried to reassure him that it is a good sign he is asymptomatic with this. He may just be more deconditioned after surgery causing his heart rates to increase more quickly. He does not have a way to measure his heart rate or BP at home. He is only on Lopressor 12.5mg  twice daily at home. However, will defer to Dr. Oval Linsey.  He has a couple of questions/concerns for Dr. Oval Linsey: 1. He wants to make sure nothing is wrong with his heart? 2. He has started working with his trainer again outside of cardiac rehab and wants to know how much he can push himself? He reportedly has been given the all clear by CT surgery. (Of note, I did mention to him that the big thing is to start slow and gradually increase exercise as long as he is tolerating that well).  I told  patient that I would notify Dr. Oval Linsey of his concerns. He was very appreciative for the call.  Darreld Mclean, PA-C 01/27/2020 3:32 PM

## 2020-01-30 ENCOUNTER — Encounter (HOSPITAL_COMMUNITY): Payer: Medicare Other

## 2020-01-31 NOTE — Telephone Encounter (Signed)
Raymond Ray is calling back due to never receiving a callback with the answers to his questions for Dr. Oval Linsey. Please advise.

## 2020-01-31 NOTE — Telephone Encounter (Signed)
Spoke with patient. Patient informed Dr. Oval Linsey is out of the office at this time. Will route to MD for review. Patient verbalized understanding.

## 2020-02-01 ENCOUNTER — Encounter (HOSPITAL_COMMUNITY): Payer: Medicare Other

## 2020-02-01 NOTE — Telephone Encounter (Signed)
Patient is calling back once again. He is very upset at not receiving a call back and wants to speak with Rip Harbour or Dr. Oval Linsey asap!

## 2020-02-01 NOTE — Telephone Encounter (Signed)
What matters most is how he is feeling.  This can happen with atrial fibrillation and doesn't mean anything is wrong with his heart.  As long as his heart rate is <100 at rest and he feels good with exercise, we don't need to focus on the numbers.  They should not stop him for a high heart rate unless he feels poorly (short of breath, light headed, chest pain, etc.).  He should listen to his body but feel free to push himself.  If he wants to continue cardiac rehab with these parameters in mind and not focusing on heart rate, that is great.  If he prefers to just work with his trainer, that's ok too.  I just want him to stay active, not hinder him.

## 2020-02-01 NOTE — Telephone Encounter (Signed)
Patient calling back. He is very upset at not receiving a call back and wants to speak with Rip Harbour or Dr. Oval Linsey.

## 2020-02-01 NOTE — Telephone Encounter (Addendum)
Spoke with patient and reviewed recommendations  He will likely just utilize is trainer since cardiac rehab will not exercise him with his elevated HR  Patient stated that he has been walking 3 times a day for 10 weeks with a gradual incline  After walking up the incline he can tell his heart is racing with no improvement after doing same walk for the 10 weeks.  Patient can also feel his heart racing after walking up stairs  Recovers quickly.  None of these things he felt prior to surgery.  Patient feels ok but wanted Dr Oval Linsey to be aware of these things since PA he spoke with Friday mentioned possibly going up on Metoprolol  Does not check his blood pressure at home Will forward to Dr Oval Linsey for review

## 2020-02-02 MED ORDER — METOPROLOL TARTRATE 25 MG PO TABS
25.0000 mg | ORAL_TABLET | Freq: Two times a day (BID) | ORAL | 3 refills | Status: DC
Start: 2020-02-02 — End: 2020-04-11

## 2020-02-02 NOTE — Telephone Encounter (Signed)
He can try increasing metoprolol to 25mg .  He will need to monitor his BP.  If he gets too lightheaded or SBP <100 let us know.

## 2020-02-02 NOTE — Telephone Encounter (Signed)
Advised patient, verbalized understanding New Rx sent to patient

## 2020-02-03 ENCOUNTER — Encounter (HOSPITAL_COMMUNITY): Payer: Medicare Other

## 2020-02-03 ENCOUNTER — Ambulatory Visit (INDEPENDENT_AMBULATORY_CARE_PROVIDER_SITE_OTHER): Payer: Medicare Other | Admitting: Pharmacist

## 2020-02-03 ENCOUNTER — Telehealth (HOSPITAL_COMMUNITY): Payer: Self-pay | Admitting: *Deleted

## 2020-02-03 ENCOUNTER — Other Ambulatory Visit: Payer: Self-pay

## 2020-02-03 DIAGNOSIS — I4821 Permanent atrial fibrillation: Secondary | ICD-10-CM | POA: Diagnosis not present

## 2020-02-03 DIAGNOSIS — Z9889 Other specified postprocedural states: Secondary | ICD-10-CM | POA: Diagnosis not present

## 2020-02-03 LAB — POCT INR: INR: 2.6 (ref 2.0–3.0)

## 2020-02-03 MED ORDER — APIXABAN 5 MG PO TABS: 5.0000 mg | ORAL_TABLET | Freq: Two times a day (BID) | ORAL | 4 refills | Status: DC

## 2020-02-03 NOTE — Patient Instructions (Signed)
*  STOP taking warfarin today.  *1st Eliquis dose tomorrow in the morning*

## 2020-02-03 NOTE — Telephone Encounter (Signed)
Spoke with Raymond Ray this morning. Per Dr Oval Linsey Raymond Ray will not be returning to cardiac rehab. Raymond Ray plans to continue exercise with his person trainer. Raymond Ray attended 4 exercise sessions between 01/09/20-01/27/20.Barnet Pall, RN,BSN 02/03/2020 10:42 AM

## 2020-02-06 ENCOUNTER — Encounter (HOSPITAL_COMMUNITY): Payer: Medicare Other

## 2020-02-08 ENCOUNTER — Encounter (HOSPITAL_COMMUNITY): Payer: Medicare Other

## 2020-02-10 ENCOUNTER — Encounter (HOSPITAL_COMMUNITY): Payer: Medicare Other

## 2020-02-13 ENCOUNTER — Encounter (HOSPITAL_COMMUNITY): Payer: Medicare Other

## 2020-02-15 ENCOUNTER — Encounter (HOSPITAL_COMMUNITY): Payer: Medicare Other

## 2020-02-20 ENCOUNTER — Encounter (HOSPITAL_COMMUNITY): Payer: Medicare Other

## 2020-02-22 ENCOUNTER — Encounter (HOSPITAL_COMMUNITY): Payer: Medicare Other

## 2020-02-27 ENCOUNTER — Encounter (HOSPITAL_COMMUNITY): Payer: Medicare Other

## 2020-02-29 ENCOUNTER — Encounter (HOSPITAL_COMMUNITY): Payer: Medicare Other

## 2020-03-02 ENCOUNTER — Encounter (HOSPITAL_COMMUNITY): Payer: Medicare Other

## 2020-03-07 DIAGNOSIS — C61 Malignant neoplasm of prostate: Secondary | ICD-10-CM | POA: Diagnosis not present

## 2020-03-26 ENCOUNTER — Telehealth: Payer: Self-pay | Admitting: *Deleted

## 2020-03-26 DIAGNOSIS — M533 Sacrococcygeal disorders, not elsewhere classified: Secondary | ICD-10-CM | POA: Diagnosis not present

## 2020-03-26 NOTE — Telephone Encounter (Signed)
Pharmacy please comment on anticoagulation and then we will contact the patient for preop clearance.  Kerin Ransom PA-C 03/26/2020 4:39 PM

## 2020-03-26 NOTE — Telephone Encounter (Signed)
Patient with diagnosis of afib on Eliquis for anticoagulation.    Procedure: left THA Date of procedure: TBD  CHA2DS2-VASc Score = 2  This indicates a 2.2% annual risk of stroke. The patient's score is based upon: CHF History: No HTN History: No Diabetes History: No Stroke History: No Vascular Disease History: Yes Age Score: 1 Gender Score: 0  CrCl 84mL/min Platelet count 248K  Per office protocol, patient can hold Eliquis for 3 days prior to procedure.

## 2020-03-26 NOTE — Telephone Encounter (Signed)
   [facility] Pre-operative Risk Assessment    HEARTCARE STAFF: - Please ensure there is not already an duplicate clearance open for this procedure. - Under Visit Info/Reason for Call, type in Other and utilize the format Clearance MM/DD/YY or Clearance TBD. Do not use dashes or single digits. - If request is for dental extraction, please clarify the # of teeth to be extracted.  Request for surgical clearance:  1. What type of surgery is being performed? Left Total Hip Arthroplasty   2. When is this surgery scheduled? TBD   3. What type of clearance is required (medical clearance vs. Pharmacy clearance to hold med vs. Both)? Both  4. Are there any medications that need to be held prior to surgery and how long?Eliquis   5. Practice name and name of physician performing surgery? [facility] Dr Lyla Glassing   6. What is the office phone number? [phone]   7.   What is the office fax number? [phone]  8.   Anesthesia type (None, local, MAC, general) ? Georgiann Hahn 03/26/2020, 3:37 PM  _________________________________________________________________   (provider comments below)

## 2020-03-27 ENCOUNTER — Telehealth: Payer: Self-pay | Admitting: Cardiovascular Disease

## 2020-03-27 NOTE — Telephone Encounter (Signed)
Left message to call back\  Kerin Ransom PA-C 03/27/2020 8:51 AM

## 2020-03-27 NOTE — Telephone Encounter (Signed)
Patient is returning Luke's call. Please advise. 

## 2020-03-27 NOTE — Telephone Encounter (Signed)
   Primary Cardiologist: [name], MD  Chart reviewed and patient contacted by phone today as part of pre-operative protocol coverage. Given past medical history and time since last visit, based on ACC/AHA guidelines, Raymond Ray would be at acceptable risk for the planned procedure without further cardiovascular testing.   OK to hold Eliquis 3 days pre op and resume as soon as safe post op.  The patient was advised that if he develops new symptoms prior to surgery to contact our office to arrange for a follow-up visit, and he verbalized understanding.  I will route this recommendation to the requesting party via Epic fax function and remove from pre-op pool.  Please call with questions.  Kerin Ransom, PA-C 03/27/2020, 11:06 AM

## 2020-03-28 DIAGNOSIS — C61 Malignant neoplasm of prostate: Secondary | ICD-10-CM | POA: Diagnosis not present

## 2020-04-02 ENCOUNTER — Telehealth: Payer: Self-pay | Admitting: *Deleted

## 2020-04-02 DIAGNOSIS — R0600 Dyspnea, unspecified: Secondary | ICD-10-CM

## 2020-04-02 DIAGNOSIS — R0609 Other forms of dyspnea: Secondary | ICD-10-CM

## 2020-04-02 DIAGNOSIS — R000 Tachycardia, unspecified: Secondary | ICD-10-CM

## 2020-04-02 NOTE — Telephone Encounter (Signed)
Dr Oval Linsey would like patient to have GXT and 14 day zio for exertional dyspnea and tachycardia Message sent to schedulers to arrange  Patient aware of Covid test

## 2020-04-03 ENCOUNTER — Ambulatory Visit (INDEPENDENT_AMBULATORY_CARE_PROVIDER_SITE_OTHER): Payer: Medicare Other

## 2020-04-03 DIAGNOSIS — R0600 Dyspnea, unspecified: Secondary | ICD-10-CM

## 2020-04-03 DIAGNOSIS — D2262 Melanocytic nevi of left upper limb, including shoulder: Secondary | ICD-10-CM | POA: Diagnosis not present

## 2020-04-03 DIAGNOSIS — L814 Other melanin hyperpigmentation: Secondary | ICD-10-CM | POA: Diagnosis not present

## 2020-04-03 DIAGNOSIS — L111 Transient acantholytic dermatosis [Grover]: Secondary | ICD-10-CM | POA: Diagnosis not present

## 2020-04-03 DIAGNOSIS — R000 Tachycardia, unspecified: Secondary | ICD-10-CM

## 2020-04-03 DIAGNOSIS — L821 Other seborrheic keratosis: Secondary | ICD-10-CM | POA: Diagnosis not present

## 2020-04-03 DIAGNOSIS — Z85828 Personal history of other malignant neoplasm of skin: Secondary | ICD-10-CM | POA: Diagnosis not present

## 2020-04-03 DIAGNOSIS — D225 Melanocytic nevi of trunk: Secondary | ICD-10-CM | POA: Diagnosis not present

## 2020-04-03 DIAGNOSIS — D1801 Hemangioma of skin and subcutaneous tissue: Secondary | ICD-10-CM | POA: Diagnosis not present

## 2020-04-03 DIAGNOSIS — R0609 Other forms of dyspnea: Secondary | ICD-10-CM

## 2020-04-03 DIAGNOSIS — D2261 Melanocytic nevi of right upper limb, including shoulder: Secondary | ICD-10-CM | POA: Diagnosis not present

## 2020-04-04 ENCOUNTER — Other Ambulatory Visit: Payer: Self-pay | Admitting: *Deleted

## 2020-04-04 DIAGNOSIS — R000 Tachycardia, unspecified: Secondary | ICD-10-CM

## 2020-04-04 DIAGNOSIS — R0600 Dyspnea, unspecified: Secondary | ICD-10-CM

## 2020-04-04 DIAGNOSIS — R0609 Other forms of dyspnea: Secondary | ICD-10-CM

## 2020-04-06 DIAGNOSIS — R0600 Dyspnea, unspecified: Secondary | ICD-10-CM | POA: Diagnosis not present

## 2020-04-06 DIAGNOSIS — R000 Tachycardia, unspecified: Secondary | ICD-10-CM

## 2020-04-11 ENCOUNTER — Encounter: Payer: Self-pay | Admitting: Cardiovascular Disease

## 2020-04-11 ENCOUNTER — Other Ambulatory Visit: Payer: Self-pay

## 2020-04-11 ENCOUNTER — Ambulatory Visit (INDEPENDENT_AMBULATORY_CARE_PROVIDER_SITE_OTHER): Payer: Medicare Other | Admitting: Cardiovascular Disease

## 2020-04-11 VITALS — BP 118/70 | HR 88 | Ht 75.0 in | Wt 175.8 lb

## 2020-04-11 DIAGNOSIS — Z9889 Other specified postprocedural states: Secondary | ICD-10-CM

## 2020-04-11 DIAGNOSIS — I340 Nonrheumatic mitral (valve) insufficiency: Secondary | ICD-10-CM

## 2020-04-11 DIAGNOSIS — I4821 Permanent atrial fibrillation: Secondary | ICD-10-CM | POA: Diagnosis not present

## 2020-04-11 DIAGNOSIS — R000 Tachycardia, unspecified: Secondary | ICD-10-CM

## 2020-04-11 DIAGNOSIS — R0600 Dyspnea, unspecified: Secondary | ICD-10-CM

## 2020-04-11 DIAGNOSIS — R0609 Other forms of dyspnea: Secondary | ICD-10-CM | POA: Insufficient documentation

## 2020-04-11 HISTORY — DX: Dyspnea, unspecified: R06.00

## 2020-04-11 HISTORY — DX: Other forms of dyspnea: R06.09

## 2020-04-11 MED ORDER — METOPROLOL TARTRATE 25 MG PO TABS: ORAL_TABLET | ORAL | 3 refills | Status: DC

## 2020-04-11 NOTE — Progress Notes (Signed)
Cardiology Office Note  Morning Date:  04/11/2020   ID:  Raymond Ray, DOB [birth year], MRN [id number]  PCP:  Crist Infante, MD  Cardiologist:  [name], MD  Electrophysiologist:  None   Evaluation Performed:  Follow-Up Visit  Chief Complaint:  Atrial fibrillation  History of Present Illness:    Raymond Ray is a 70 y.o. male with chronic atrial fibrillation (s/p LAA clipping) and mitral regurgitation 2/2 MVP s/p mitral valve repair, mild carotid stenosis, mild CAD, who presents for follow up.  Raymond Ray was previously a patient of Raymond Ray.  He first developed atrial fibrillation in 1986.  He was asymptomatic and it was discovered on physical exam.  He was initially treated with amiodarone and this was discontinued due to concern for long-term toxicity.  He was then on quinidine and this was transitioned to digoxin when he moved from [place] to the [place]. Raymond Ray had an echo 10/09/09 that showed normal systolic function and posterior mitral valve prolapse with moderate mitral regurgitation and normal PA pressures.  He had a nuclear stress 04/29/02 showing no ischemia and an ejection fraction of 55%.  He developed epistaxis on Xarelto and switched back to Coumadin with an INR goal of 1.8-2.2.  However,  He switch to Eliquis with no overt bleeding.  He was noted to have a low iron levels and anemia.  He was started on iron supplementation and has continued on Eliquis.  He had a colonoscopy 08/2018 that revealed diverticuli and a 3 mm polyp that was removed.  At his last appointment he reported some mild exertional dyspnea.  He had a repeat echo 07/2019 that revealed LVEF 50 to 55%.  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  He underwent minimally invasive mitral valve repair with Dr. Roxy Manns on 09/2019.  He had a 36 mm Medtronic Sinuform Ring and the left atrial appendage was clipped.  He was switched back to warfarin for valvular atrial  fibrillation.  At his last appointment Raymond Ray was doing well and asymptomatic from atrial fibrillation.  When he started cardiac rehab and was monitored it was noted that his heart rate was elevating with minimal exertion.  He felt OK and wanted to do more.  He feels like it is taking him more time to recover after this surgery than others.  He is able to feel his heart more when he lays in bed.  Prior to surgery he never felt his heart.  He generally feels good and like he has more energy.  He has some shortness of breath with exercise but no chest pain or pressure.  He has no more LE edema, orthopnea or PND since the surgery.  He notes that when he uses the elliptical he is only able to go at level 4.  Prior to surgery he was able to exercise at level 8 or 9.  He notes that some days are better than others, though this has been unchanged since prior to surgery.  His overall conditioning is just poor.  At times when he checks his heart rate it is over 100 bpm at rest.  In the past it was usually much lower.  He has a trip to the [place] planned soon and is also contemplating having his left hip replaced.   Past Medical History:  Diagnosis Date  . Allergy   . Anemia   . Chronic atrial fibrillation ([place])   . Exertional dyspnea 04/11/2020  .  Heart murmur   . History of colonoscopy 04/2001   negative  . Mitral valve prolapse   . Nonrheumatic mitral (valve) insufficiency   . Osteoarthritis of hip   . Permanent atrial fibrillation ([place])   . PONV (postoperative nausea and vomiting)    left knee cartilage removal;    . Prostate cancer ([place])   . 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 Tricuspid regurgitation   . Vertigo 01/07/2017   currently not symptomatic    Past Surgical History:  Procedure Laterality Date  .  BUBBLE STUDY  08/24/2019   Procedure: BUBBLE STUDY;  Surgeon: [name], MD;  Location: [facility];  Service: Cardiology;;  . CARDIAC CATHETERIZATION    . CLIPPING OF ATRIAL APPENDAGE N/A 09/28/2019   Procedure: CLIPPING OF ATRIAL APPENDAGE USING ATRICURE CLIP SIZE 45MM;  Surgeon: Rexene Alberts, MD;  Location: [facility];  Service: Open Heart Surgery;  Laterality: N/A;  . COLONOSCOPY    . KNEE SURGERY     left at age 64 in [place]  . LYMPHADENECTOMY Bilateral 06/02/2019   Procedure: LYMPHADENECTOMY, PELVIC;  Surgeon: [name], MD;  Location: [facility];  Service: Urology;  Laterality: Bilateral;  . MINIMALLY INVASIVE TRICUSPID VALVE REPAIR Right 09/28/2019   Procedure: possible MINIMALLY INVASIVE TRICUSPID VALVE REPAIR;  Surgeon: Rexene Alberts, MD;  Location: [facility];  Service: Open Heart Surgery;  Laterality: Right;  . MITRAL VALVE REPAIR Right 09/28/2019   Procedure: MINIMALLY INVASIVE MITRAL VALVE REPAIR (MVR) USING SIMUFORM 36MM;  Surgeon: Rexene Alberts, MD;  Location: [facility];  Service: Open Heart Surgery;  Laterality: Right;  . PROSTATE BIOPSY    . RIGHT/LEFT HEART CATH AND CORONARY ANGIOGRAPHY N/A 09/07/2019   Procedure: RIGHT/LEFT HEART CATH AND CORONARY ANGIOGRAPHY;  Surgeon: Sherren Mocha, MD;  Location: [facility];  Service: Cardiovascular;  Laterality: N/A;  . ROBOT ASSISTED LAPAROSCOPIC RADICAL PROSTATECTOMY N/A 06/02/2019   Procedure: XI ROBOTIC ASSISTED LAPAROSCOPIC RADICAL PROSTATECTOMY LEVEL 2;  Surgeon: [name], MD;  [facility];  Service: Urology;  Laterality: N/A;  . spinal injection     December 2013  . TEE WITHOUT CARDIOVERSION N/A 08/24/2019   Procedure: TRANSESOPHAGEAL ECHOCARDIOGRAM (TEE);  Surgeon: [name], MD;  Location: [facility];  Service: Cardiology;  Laterality: N/A;  . TEE WITHOUT CARDIOVERSION N/A 09/28/2019   Procedure: TRANSESOPHAGEAL ECHOCARDIOGRAM (TEE);  Surgeon: Rexene Alberts, MD;  Location: [facility];  Service: Open  Heart Surgery;  Laterality: N/A;  . TOTAL HIP ARTHROPLASTY Right 01/29/2017   Procedure: RIGHT TOTAL HIP ARTHROPLASTY ANTERIOR APPROACH;  Surgeon: Rod Can, MD;  [facility];  Service: Orthopedics;  Laterality: Right;  Marland Kitchen VASECTOMY       Current Meds  Medication Sig  . apixaban (ELIQUIS) 5 MG TABS tablet Take 1 tablet (5 mg total) by mouth 2 (two) times daily.  Marland Kitchen ascorbic acid (VITAMIN C) 500 MG tablet Take 500 mg by mouth daily.   . ferrous [id number] C-folic acid (TRINSICON / FOLTRIN) capsule Take 1 capsule by mouth daily with breakfast.  . Multiple Vitamins-Minerals (MULTIVITAMIN WITH MINERALS) tablet Take 1 tablet by mouth daily. [facility] men   . oxyCODONE-acetaminophen (PERCOCET) 10-325 MG tablet Take 1 tablet by mouth as needed for pain. Pt takes half tablet  . polyethylene glycol (MIRALAX / GLYCOLAX) 17 g packet Take 17 g by mouth  daily.  . rosuvastatin (CRESTOR) 5 MG tablet Take 5 mg by mouth daily.  Marland Kitchen zolpidem (AMBIEN CR) 12.5 MG CR tablet Take 12.5 mg by mouth at bedtime.  . [DISCONTINUED] metoprolol tartrate (LOPRESSOR) 25 MG tablet Take 1 tablet (25 mg total) by mouth 2 (two) times daily.     Allergies:   Patient has no known allergies.   Social History   Tobacco Use  . Smoking status: Never Smoker  . Smokeless tobacco: Never Used  Vaping Use  . Vaping Use: Never used  Substance Use Topics  . Alcohol use: No  . Drug use: No     Family Hx: The patient's family history includes Arrhythmia in his father; Breast cancer in his paternal aunt and sister; Cancer in his mother; Diabetes in his mother; Heart attack in his mother. There is no history of Colon cancer, Esophageal cancer, Stomach cancer, or Rectal cancer.  ROS:   Please see the history of present illness.     All other systems reviewed and are negative.   Prior CV studies:   The following studies were reviewed today:  Echo 09/2019: 1. Normal LV function; s/p MV repair with mild MS (mean  gradient 5 mm Hg;  MVA 2.1 cm2) and no MR; moderate biatrial enlargement.  2. Left ventricular ejection fraction, by estimation, is 50 to 55%. The  left ventricle has low normal function. The left ventricle has no regional  wall motion abnormalities.  3. Right ventricular systolic function is normal. The right ventricular  size is normal.  4. Left atrial size was moderately dilated.  5. Right atrial size was moderately dilated.  6. The mitral valve has been repaired/replaced. Trivial mitral valve  regurgitation. Mild mitral stenosis.  7. The aortic valve is tricuspid. Aortic valve regurgitation is not  visualized. No aortic stenosis is present.  8. The inferior vena cava is dilated in size with >50% respiratory  variability, suggesting right atrial pressure of 8 mmHg.   Echo 07/2019: 1. Left ventricular ejection fraction, by estimation, is 50 to 55%. The  left ventricle has low normal function. The left ventricle has no regional  wall motion abnormalities. Left ventricular diastolic function could not  be evaluated.  2. Right ventricular systolic function is normal. The right ventricular  size is normal. There is moderately elevated pulmonary artery systolic  pressure.  3. Left atrial size was severely dilated.  4. Right atrial size was severely dilated.  5. The mitral valve is myxomatous. Moderate to severe mitral valve  regurgitation.  6. Tricuspid valve regurgitation is mild to moderate.  7. The aortic valve is normal in structure. Aortic valve regurgitation is  not visualized. No aortic stenosis is present.  8. Aortic dilatation noted. 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   LHC 08/2019: 1.  Calcified nonobstructive proximal LAD stenosis 2.  Patent coronary arteries with mild diffuse irregularity and no flow-limiting coronary stenoses 3.  Normal right heart filling pressures with preserved cardiac output 4.  20 mm V wave consistent  with the patient's known mitral regurgitation  Recommendation: Continued plans for cardiac surgical evaluation for mitral valve repair  Carotid Dopplers 09/2019: 1 to 39% ICA stenosis bilaterally.   Labs/Other Tests and Data Reviewed:    EKG:  An ECG dated 10/14/2019 was personally reviewed today and demonstrated:  Atrial fibrillation.  Rate 80 bpm.  04/11/2020: Atrial fibrillation.  Rate 88 bpm.  Recent Labs: 08/11/2019: NT-Pro BNP 1,865 09/26/2019: ALT 12  09/30/2019: Magnesium 2.1 10/03/2019: BUN 25; Hemoglobin 8.2; Platelets 248; Potassium 4.0; Sodium 137 10/14/2019: Creatinine, Ser 2.30   Recent Lipid Panel Lab Results  Component Value Date/Time   CHOL 166 03/03/2011 09:22 AM   TRIG 41.0 03/03/2011 09:22 AM   HDL 55.80 03/03/2011 09:22 AM   CHOLHDL 3 03/03/2011 09:22 AM   LDLCALC 102 (H) 03/03/2011 09:22 AM    04/30/2018: Total cholesterol 132, triglycerides 64, HDL 54, LDL 73 Sodium 140, BUN 28, creatinine 1.3 AST 18, ALT 15 TSH 1.37  07/08/2018: Hemoglobin 12.2  Wt Readings from Last 3 Encounters:  04/11/20 175 lb 12.8 oz (79.7 kg)  01/27/20 170 lb 13.7 oz (77.5 kg)  01/09/20 174 lb (78.9 kg)     Objective:    VS:  BP 118/70   Pulse 88   Ht 6\' 3"  (1.905 m)   Wt 175 lb 12.8 oz (79.7 kg)   SpO2 98%   BMI 21.97 kg/m  , BMI Body mass index is 21.97 kg/m. GENERAL:  Well appearing HEENT: Pupils equal round and reactive, fundi not visualized, oral mucosa unremarkable NECK:  No jugular venous distention, waveform within normal limits, carotid upstroke brisk and symmetric, no bruits, no thyromegaly LYMPHATICS:  No cervical adenopathy LUNGS:  Clear to auscultation bilaterally HEART:  RRR.  PMI not displaced or sustained,S1 and S2 within normal limits, no S3, no S4, no clicks, no rubs, no murmurs ABD:  Flat, positive bowel sounds normal in frequency in pitch, no bruits, no rebound, no guarding, no midline pulsatile mass, no hepatomegaly, no splenomegaly EXT:  2 plus  pulses throughout, no edema, no cyanosis no clubbing SKIN:  No rashes no nodules NEURO:  Cranial nerves II through XII grossly intact, motor grossly intact throughout PSYCH:  Cognitively intact, oriented to person place and time   ASSESSMENT & PLAN:    # Persistent atrial fibrillation: Rates are not as well-controlled.  Will increase metoprolol to 37.5 mg twice daily.  He will continue to monitor his blood pressure and heart rate at home.  He is currently wearing a ZIO monitor as well.  ETT is pending to assess his EKG changes and heart rate control with exercise.  I have asked him to continue his home dose of metoprolol prior to stress.  LAA was clipped.  This patients CHA2DS2-VASc Score and unadjusted Ischemic Stroke Rate (% per year) is equal to 0.6 % stroke rate/year from a score of 1  Above score calculated as 1 point each if present CHF, HTN, DM, Vascular=MI/PAD/Aortic Plaque, Age if 29-74, or Male Above score calculated as 2 points each if present age > 75, or Stroke/TIA/TE  # Mitral regurgitation:  # Tricuspid regurgitation: S/p annuloplasty ring repair and atrial appendage clipping 07/2019.  Mean gradient 5 mmHg on echo 09/2019.  He is euvolemic and no longer has lower extremity edema.  He will need antibiotic prophylaxis for dental procedures.  # Mild ascending aortic aneurysm: 3.8 cm on 07/2019.  # Mild LAD stenosis: 35% LAD on cath.  LDL was 74 on 04/2018.  LDL goal is less than 70.  It was 46 on 12/2019.  Continue rosuvastatin.  # Mild carotid stenosis.   Repeat 09/2020.  Continue rosuvastaitn.   Medication Adjustments/Labs and Tests Ordered: Current medicines are reviewed at length with the patient today.  Concerns regarding medicines are outlined above.   Tests Ordered: Orders Placed This Encounter  Procedures  . EKG 12-Lead    Medication Changes: Meds ordered this encounter  Medications  .  metoprolol tartrate (LOPRESSOR) 25 MG tablet    Sig: 1 AND 1/2 TABLETS  TWICE A DAY    Dispense:  270 tablet    Refill:  3    New dose, d/c previous RX    Disposition:  Follow up in 2 month(s)  Signed, [name], MD  04/11/2020 10:58 AM    [facility]

## 2020-04-11 NOTE — Patient Instructions (Addendum)
Medication Instructions:  Friday INCREASE YOUR METOPROLOL TO 1 AND 1/2 TWICE  A DAY   *If you need a refill on your cardiac medications before your next appointment, please call your pharmacy*  Lab Work: NONE  Testing/Procedures: KEEP STRESS TEST AS SCHEDULED  DO NOT HOLD YOUR METOPROLOL MORNING OF THE TEST   Follow-Up: At [facility], you and your health needs are our priority.  As part of our continuing mission to provide you with exceptional heart care, we have created designated Provider Care Teams.  These Care Teams include your primary Cardiologist (physician) and Advanced Practice Providers (APPs -  Physician Assistants and Nurse Practitioners) who all work together to provide you with the care you need, when you need it.  We recommend signing up for the patient portal called "MyChart".  Sign up information is provided on this After Visit Summary.  MyChart is used to connect with patients for Virtual Visits (Telemedicine).  Patients are able to view lab/test results, encounter notes, upcoming appointments, etc.  Non-urgent messages can be sent to your provider as well.   To learn more about what you can do with MyChart, go to [email or web address].    Your next appointment:   2 month(s)  The format for your next appointment:   In Person  Provider:   You may see [name], MD or one of the following Advanced Practice Providers on your designated Care Team:    Kerin Ransom, PA-C  [place], [place]  Coletta Memos, [place]

## 2020-04-12 DIAGNOSIS — M5136 Other intervertebral disc degeneration, lumbar region: Secondary | ICD-10-CM | POA: Diagnosis not present

## 2020-04-16 ENCOUNTER — Ambulatory Visit: Payer: Self-pay | Admitting: Student

## 2020-04-16 ENCOUNTER — Telehealth: Payer: Self-pay

## 2020-04-16 NOTE — Telephone Encounter (Signed)
Patient with diagnosis of ATRIAL FIBRILLATION on  ELIQUIS for anticoagulation.    Procedure: ARTHROPALSTY Date of procedure: 06/20/2020   CHA2DS2-VASc Score = 2  This indicates a 2.2% annual risk of stroke. The patient's score is based upon: CHF History: No HTN History: No Diabetes History: No Stroke History: No Vascular Disease History: Yes Age Score: 1 Gender Score: 0   CrCl = 57.3ML/MIN Platelet count = 248  Per office protocol, patient can hold ELIQUIS for 3 days prior to procedure.   Patient Raymond Ray not need bridging with Lovenox (enoxaparin) around procedure.  Patient should restart ELIQUIS as soon as possible after procedure.  For orthopedic procedures please be sure to resume therapeutic (not prophylactic) dosing.

## 2020-04-16 NOTE — Telephone Encounter (Signed)
   [facility] Pre-operative Risk Assessment    HEARTCARE STAFF: - Please ensure there is not already an duplicate clearance open for this procedure. - Under Visit Info/Reason for Call, type in Other and utilize the format Clearance MM/DD/YY or Clearance TBD. Do not use dashes or single digits. - If request is for dental extraction, please clarify the # of teeth to be extracted.  Request for surgical clearance:  1. What type of surgery is being performed? Left Total Hip Arthoplasty   2. When is this surgery scheduled? June 20, 2020   3. What type of clearance is required (medical clearance vs. Pharmacy clearance to hold med vs. Both)? Both  4. Are there any medications that need to be held prior to surgery and how long?Eliquis   5. Practice name and name of physician performing surgery? [facility], Dr. Aaron Edelman Swinte[place]   6. What is the office phone number? [phone]   7.   What is the office fax number? [phone]  8.   Anesthesia type (None, local, MAC, general) ? Spinal   Monia Pouch 04/16/2020, 10:03 AM  _________________________________________________________________   (provider comments below)

## 2020-04-16 NOTE — Telephone Encounter (Signed)
   Primary Cardiologist: [name], MD  Chart reviewed as part of pre-operative protocol coverage.Mr.  Jeston Junkins was last seen by Dr. Oval Linsey 04/11/20.   At that time he was recommended for ZIO monitor as well as ETT. His ETT is upcoming 04/27/2020. He is presently wearing ZIO monitor. Will defer recommendations for preop clearance until after this testing has been completed. He has upcoming appointment 05/29/20 with Dr. Oval Linsey to follow up after testing. Preoperative clearance can be addressed at that visit - I have updated appointment notes and will route to Dr. Oval Linsey so she is aware.   Sent to pharmacy team for recommendations regarding anticoagulation.   I will route this recommendation to the requesting party via Epic fax function so they are aware and remove from preop pool.   Loel Dubonnet, NP 04/16/2020, 11:23 AM

## 2020-04-23 ENCOUNTER — Telehealth: Payer: Self-pay | Admitting: Cardiovascular Disease

## 2020-04-23 MED ORDER — APIXABAN 5 MG PO TABS: 5.0000 mg | ORAL_TABLET | Freq: Two times a day (BID) | ORAL | 5 refills | Status: DC

## 2020-04-23 NOTE — Telephone Encounter (Signed)
*  STAT* If patient is at the pharmacy, call can be transferred to refill team.   1. Which medications need to be refilled? (please list name of each medication and dose if known)   apixaban (ELIQUIS) 5 MG TABS tablet    2. Which pharmacy/location (including street and city if local pharmacy) is medication to be sent to?  [id number] - [place], [place] - [address]    3. Do they need a 30 day or 90 day supply? 90  

## 2020-04-23 NOTE — Telephone Encounter (Signed)
Prescription refill request for Eliquis received. Indication: atrial fibrillation Last office visit:2/22  [place] Scr: 1.3  8/21 Age: 70 Weight:79.7 kg  Prescription refilled

## 2020-04-24 ENCOUNTER — Other Ambulatory Visit (HOSPITAL_COMMUNITY)
Admission: RE | Admit: 2020-04-24 | Discharge: 2020-04-24 | Disposition: A | Discharge status: Home / Self Care | Payer: Medicare Other | Source: Ambulatory Visit | Attending: Cardiovascular Disease | Admitting: Cardiovascular Disease

## 2020-04-24 DIAGNOSIS — I2510 Atherosclerotic heart disease of native coronary artery without angina pectoris: Secondary | ICD-10-CM | POA: Diagnosis not present

## 2020-04-24 DIAGNOSIS — I4811 Longstanding persistent atrial fibrillation: Secondary | ICD-10-CM | POA: Diagnosis not present

## 2020-04-24 DIAGNOSIS — I341 Nonrheumatic mitral (valve) prolapse: Secondary | ICD-10-CM | POA: Diagnosis not present

## 2020-04-24 DIAGNOSIS — Z20822 Contact with and (suspected) exposure to covid-19: Secondary | ICD-10-CM | POA: Insufficient documentation

## 2020-04-24 DIAGNOSIS — D649 Anemia, unspecified: Secondary | ICD-10-CM | POA: Diagnosis not present

## 2020-04-24 DIAGNOSIS — C61 Malignant neoplasm of prostate: Secondary | ICD-10-CM | POA: Diagnosis not present

## 2020-04-24 DIAGNOSIS — I4891 Unspecified atrial fibrillation: Secondary | ICD-10-CM | POA: Diagnosis not present

## 2020-04-24 DIAGNOSIS — Z01812 Encounter for preprocedural laboratory examination: Secondary | ICD-10-CM | POA: Insufficient documentation

## 2020-04-24 DIAGNOSIS — D6869 Other thrombophilia: Secondary | ICD-10-CM | POA: Diagnosis not present

## 2020-04-24 DIAGNOSIS — R972 Elevated prostate specific antigen [PSA]: Secondary | ICD-10-CM | POA: Diagnosis not present

## 2020-04-24 DIAGNOSIS — G609 Hereditary and idiopathic neuropathy, unspecified: Secondary | ICD-10-CM | POA: Diagnosis not present

## 2020-04-24 DIAGNOSIS — E611 Iron deficiency: Secondary | ICD-10-CM | POA: Diagnosis not present

## 2020-04-24 LAB — SARS CORONAVIRUS 2 (TAT 6-24 HRS): SARS Coronavirus 2: NEGATIVE

## 2020-04-25 DIAGNOSIS — R000 Tachycardia, unspecified: Secondary | ICD-10-CM | POA: Diagnosis not present

## 2020-04-25 DIAGNOSIS — R0600 Dyspnea, unspecified: Secondary | ICD-10-CM | POA: Diagnosis not present

## 2020-04-26 ENCOUNTER — Telehealth (HOSPITAL_COMMUNITY): Payer: Self-pay | Admitting: *Deleted

## 2020-04-26 NOTE — Telephone Encounter (Signed)
Close encounter 

## 2020-04-27 ENCOUNTER — Other Ambulatory Visit: Payer: Self-pay

## 2020-04-27 ENCOUNTER — Ambulatory Visit (HOSPITAL_COMMUNITY)
Admission: RE | Admit: 2020-04-27 | Discharge: 2020-04-27 | Disposition: A | Discharge status: Home / Self Care | Payer: Medicare Other | Source: Ambulatory Visit | Attending: Cardiology | Admitting: Cardiology

## 2020-04-27 ENCOUNTER — Telehealth: Payer: Self-pay | Admitting: *Deleted

## 2020-04-27 DIAGNOSIS — Z79899 Other long term (current) drug therapy: Secondary | ICD-10-CM

## 2020-04-27 DIAGNOSIS — T460X5A Adverse effect of cardiac-stimulant glycosides and drugs of similar action, initial encounter: Secondary | ICD-10-CM

## 2020-04-27 DIAGNOSIS — Z5181 Encounter for therapeutic drug level monitoring: Secondary | ICD-10-CM

## 2020-04-27 DIAGNOSIS — R000 Tachycardia, unspecified: Secondary | ICD-10-CM

## 2020-04-27 DIAGNOSIS — R0600 Dyspnea, unspecified: Secondary | ICD-10-CM | POA: Insufficient documentation

## 2020-04-27 DIAGNOSIS — R0609 Other forms of dyspnea: Secondary | ICD-10-CM

## 2020-04-27 LAB — EXERCISE TOLERANCE TEST
Estimated workload: 7 METS
Exercise duration (min): 6 min
Exercise duration (sec): 3 s
MPHR: 152 {beats}/min
Peak HR: 173 {beats}/min
Percent HR: 113 %
Rest HR: 80 {beats}/min

## 2020-04-27 LAB — MAGNESIUM: Magnesium: 2 mg/dL (ref 1.6–2.3)

## 2020-04-27 MED ORDER — METOPROLOL TARTRATE 25 MG PO TABS: ORAL_TABLET | ORAL | 3 refills | Status: DC

## 2020-04-27 MED ORDER — DIGOXIN 125 MCG PO TABS: ORAL_TABLET | ORAL | 3 refills | Status: DC

## 2020-04-27 MED ORDER — METOPROLOL TARTRATE 25 MG PO TABS: 25.0000 mg | ORAL_TABLET | Freq: Two times a day (BID) | ORAL | 3 refills | Status: DC

## 2020-04-27 NOTE — Telephone Encounter (Signed)
Patient in office for Stress test Dr Oval Linsey reviewed results and will decrease Metoprolol to 25 mg twice a day, start Lanoxin 0.125 mg 1 tablet twice a day for 2 days only and then 1 daily  Magnesium today and digoxin level when returns from trip Dr Oval Linsey spoke with patient and I reviewed with him as well

## 2020-05-19 ENCOUNTER — Other Ambulatory Visit: Payer: Self-pay | Admitting: Surgical

## 2020-05-23 DIAGNOSIS — Z79899 Other long term (current) drug therapy: Secondary | ICD-10-CM | POA: Diagnosis not present

## 2020-05-23 DIAGNOSIS — Z5181 Encounter for therapeutic drug level monitoring: Secondary | ICD-10-CM | POA: Diagnosis not present

## 2020-05-24 LAB — DIGOXIN LEVEL: Digoxin, Serum: 0.5 ng/mL (ref 0.5–0.9)

## 2020-05-28 ENCOUNTER — Ambulatory Visit: Payer: Self-pay | Admitting: Student

## 2020-05-28 NOTE — H&P (View-Only) (Signed)
TOTAL HIP ADMISSION H&P  Patient is admitted for left total hip arthroplasty.  Subjective:  Chief Complaint: left hip pain  HPI: Raymond Ray, 70 y.o. male, has a history of pain and functional disability in the left hip(s) due to arthritis and patient has failed non-surgical conservative treatments for greater than 12 weeks to include NSAID's and/or analgesics and activity modification.  Onset of symptoms was gradual starting 3 years ago with gradually worsening course since that time.The patient noted no past surgery on the left hip(s).  Patient currently rates pain in the left hip at 6 out of 10 with activity. Patient has worsening of pain with activity and weight bearing, pain that interfers with activities of daily living and pain with passive range of motion. Patient has evidence of subchondral cysts, subchondral sclerosis and joint space narrowing by imaging studies. This condition presents safety issues increasing the risk of falls.  There is no current active infection.  Patient Active Problem List   Diagnosis Date Noted  . Exertional dyspnea 04/11/2020  . 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 Tricuspid regurgitation   . Nonrheumatic mitral (valve) insufficiency   . Prostate cancer ([place]) 06/02/2019  . Malignant neoplasm of prostate ([place]) 04/08/2019  . Neuropathy 07/27/2018  . Lumbar radiculopathy 07/27/2018  . Hip pain 11/30/2017  . 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 Vertigo 01/07/2017  . Erectile dysfunction 04/18/2015  . Metatarsalgia 05/14/2011  . 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 Mitral regurgitation 08/07/2010  . Osteoarthritis 08/07/2010  . Permanent atrial fibrillation [facility])    Past Medical History:  Diagnosis Date  . Allergy   . Anemia   . Chronic atrial fibrillation ([place])   . Exertional dyspnea 04/11/2020  . Heart murmur   . History of colonoscopy 04/2001   negative  . Mitral valve prolapse   . Nonrheumatic mitral  (valve) insufficiency   . Osteoarthritis of hip   . Permanent atrial fibrillation ([place])   . PONV (postoperative nausea and vomiting)    left knee cartilage removal;    . Prostate cancer ([place])   . 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 Tricuspid regurgitation   . Vertigo 01/07/2017   currently not symptomatic     Past Surgical History:  Procedure Laterality Date  . BUBBLE STUDY  08/24/2019   Procedure: BUBBLE STUDY;  Surgeon: [name], MD;  Location: [place];  Service: Cardiology;;  . CARDIAC CATHETERIZATION    . CLIPPING OF ATRIAL APPENDAGE N/A 09/28/2019   Procedure: CLIPPING OF ATRIAL APPENDAGE USING ATRICURE CLIP SIZE 45MM;  Surgeon: Rexene Alberts, MD;  Location: [place];  Service: Open Heart Surgery;  Laterality: N/A;  . COLONOSCOPY    . KNEE SURGERY     left at age 55 in [place]  . LYMPHADENECTOMY Bilateral 06/02/2019   Procedure: LYMPHADENECTOMY, PELVIC;  Surgeon: [name], MD;  [facility];  Service: Urology;  Laterality: Bilateral;  . MINIMALLY INVASIVE TRICUSPID VALVE REPAIR Right 09/28/2019   Procedure: possible MINIMALLY INVASIVE TRICUSPID VALVE REPAIR;  Surgeon: Rexene Alberts, MD;  Location: [place];  Service: Open Heart Surgery;  Laterality: Right;  . MITRAL VALVE REPAIR Right 09/28/2019   Procedure: MINIMALLY INVASIVE MITRAL VALVE REPAIR (MVR) USING SIMUFORM 36MM;  Surgeon: Rexene Alberts, MD;  Location: [place];  Service: Open Heart Surgery;  Laterality: Right;  . PROSTATE BIOPSY    . RIGHT/LEFT HEART CATH AND CORONARY ANGIOGRAPHY N/A 09/07/2019   Procedure: RIGHT/LEFT HEART CATH AND CORONARY ANGIOGRAPHY;  Surgeon: Sherren Mocha, MD;  Location: [place] [facility];  Service: Cardiovascular;  Laterality: N/A;  . ROBOT ASSISTED LAPAROSCOPIC RADICAL PROSTATECTOMY N/A 06/02/2019    Procedure: XI ROBOTIC ASSISTED LAPAROSCOPIC RADICAL PROSTATECTOMY LEVEL 2;  Surgeon: [name], MD;  [facility];  Service: Urology;  Laterality: N/A;  . spinal injection     December 2013  . TEE WITHOUT CARDIOVERSION N/A 08/24/2019   Procedure: TRANSESOPHAGEAL ECHOCARDIOGRAM (TEE);  Surgeon: [name], MD;  Location: [place];  Service: Cardiology;  Laterality: N/A;  . TEE WITHOUT CARDIOVERSION N/A 09/28/2019   Procedure: TRANSESOPHAGEAL ECHOCARDIOGRAM (TEE);  Surgeon: Rexene Alberts, MD;  Location: [place];  Service: Open Heart Surgery;  Laterality: N/A;  . TOTAL HIP ARTHROPLASTY Right 01/29/2017   Procedure: RIGHT TOTAL HIP ARTHROPLASTY ANTERIOR APPROACH;  Surgeon: Rod Can, MD;  [facility];  Service: Orthopedics;  Laterality: Right;  Marland Kitchen VASECTOMY      Current Outpatient Medications  Medication Sig Dispense Refill Last Dose  . apixaban (ELIQUIS) 5 MG TABS tablet Take 1 tablet (5 mg total) by mouth 2 (two) times daily. 60 tablet 5   . ascorbic acid (VITAMIN C) 500 MG tablet Take 500 mg by mouth daily.      . digoxin (LANOXIN) 0.125 MG tablet Take 1 tablet twice a day for 2 days and then daily 92 tablet 3   . ferrous [id number] C-folic acid (TRINSICON / FOLTRIN) capsule Take 1 capsule by mouth daily with breakfast. 30 capsule 1   . metoprolol tartrate (LOPRESSOR) 25 MG tablet Take 1 tablet (25 mg total) by mouth 2 (two) times daily. 180 tablet 3   . Multiple Vitamins-Minerals (MULTIVITAMIN WITH MINERALS) tablet Take 1 tablet by mouth daily. [facility] men      . oxyCODONE-acetaminophen (PERCOCET) 10-325 MG tablet Take 1 tablet by mouth as needed for pain. Pt takes half tablet     . polyethylene glycol (MIRALAX / GLYCOLAX) 17 g packet Take 17 g by mouth daily.     . rosuvastatin (CRESTOR) 5 MG tablet Take 5 mg by mouth daily.     Marland Kitchen zolpidem (AMBIEN CR) 12.5 MG CR tablet Take 12.5 mg by mouth at bedtime.      No current facility-administered medications for  this visit.   No Known Allergies  Social History   Tobacco Use  . Smoking status: Never Smoker  . Smokeless tobacco: Never Used  Substance Use Topics  . Alcohol use: No    Family History  Problem Relation Age of Onset  . Arrhythmia Father        tachycardia  . Cancer Mother   . Heart attack Mother   . Diabetes Mother   . Breast cancer Sister   . Breast cancer Paternal Aunt   . Colon cancer Neg Hx   . Esophageal cancer Neg Hx   . Stomach cancer Neg Hx   . Rectal cancer Neg Hx      Review of Systems  Musculoskeletal: Positive for arthralgias.  All other systems reviewed and are negative.   Objective:  Physical Exam HENT:     Head: Normocephalic.  Eyes:     Pupils: Pupils are equal, round, and reactive to light.  Cardiovascular:     Rate and Rhythm: Normal rate.     Pulses: Normal pulses.  Pulmonary:  Effort: Pulmonary effort is normal.  Abdominal:     Palpations: Abdomen is soft.     Tenderness: There is no abdominal tenderness.  Genitourinary:    Comments: Deferred Musculoskeletal:     Cervical back: Normal range of motion.     Comments: Limited ROM left hip due to pain  Skin:    General: Skin is warm and dry.  Neurological:     Mental Status: He is alert and oriented to person, place, and time.  Psychiatric:        Mood and Affect: Mood normal.     Vital signs in last 24 hours: @VSRANGES @  Labs:   Estimated body mass index is 21.97 kg/m as calculated from the following:   Height as of 04/11/20: 6\' 3"  (1.905 m).   Weight as of 04/11/20: 79.7 kg.   Imaging Review Plain radiographs demonstrate severe degenerative joint disease of the left hip(s). The bone quality appears to be adequate for age and reported activity level.      Assessment/Plan:  End stage arthritis, left hip(s)  The patient history, physical examination, clinical judgement of the provider and imaging studies are consistent with end stage degenerative joint disease of the  left hip(s) and total hip arthroplasty is deemed medically necessary. The treatment options including medical management, injection therapy, arthroscopy and arthroplasty were discussed at length. The risks and benefits of total hip arthroplasty were presented and reviewed. The risks due to aseptic loosening, infection, stiffness, dislocation/subluxation,  thromboembolic complications and other imponderables were discussed.  The patient acknowledged the explanation, agreed to proceed with the plan and consent was signed. Patient is being admitted for inpatient treatment for surgery, pain control, PT, OT, prophylactic antibiotics, VTE prophylaxis, progressive ambulation and ADL's and discharge planning.The patient is planning to be discharged home after overnight observation

## 2020-05-28 NOTE — H&P (Signed)
TOTAL HIP ADMISSION H&P  Patient is admitted for left total hip arthroplasty.  Subjective:  Chief Complaint: left hip pain  HPI: Raymond Ray, 70 y.o. male, has a history of pain and functional disability in the left hip(s) due to arthritis and patient has failed non-surgical conservative treatments for greater than 12 weeks to include NSAID's and/or analgesics and activity modification.  Onset of symptoms was gradual starting 3 years ago with gradually worsening course since that time.The patient noted no past surgery on the left hip(s).  Patient currently rates pain in the left hip at 6 out of 10 with activity. Patient has worsening of pain with activity and weight bearing, pain that interfers with activities of daily living and pain with passive range of motion. Patient has evidence of subchondral cysts, subchondral sclerosis and joint space narrowing by imaging studies. This condition presents safety issues increasing the risk of falls.  There is no current active infection.  Patient Active Problem List   Diagnosis Date Noted  . Exertional dyspnea 04/11/2020  . 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 Tricuspid regurgitation   . Nonrheumatic mitral (valve) insufficiency   . Prostate cancer ([place]) 06/02/2019  . Malignant neoplasm of prostate ([place]) 04/08/2019  . Neuropathy 07/27/2018  . Lumbar radiculopathy 07/27/2018  . Hip pain 11/30/2017  . 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 Vertigo 01/07/2017  . Erectile dysfunction 04/18/2015  . Metatarsalgia 05/14/2011  . 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 Mitral regurgitation 08/07/2010  . Osteoarthritis 08/07/2010  . Permanent atrial fibrillation [facility])    Past Medical History:  Diagnosis Date  . Allergy   . Anemia   . Chronic atrial fibrillation ([place])   . Exertional dyspnea 04/11/2020  . Heart murmur   . History of colonoscopy 04/2001   negative  . Mitral valve prolapse   . Nonrheumatic mitral  (valve) insufficiency   . Osteoarthritis of hip   . Permanent atrial fibrillation ([place])   . PONV (postoperative nausea and vomiting)    left knee cartilage removal;    . Prostate cancer ([place])   . 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 Tricuspid regurgitation   . Vertigo 01/07/2017   currently not symptomatic     Past Surgical History:  Procedure Laterality Date  . BUBBLE STUDY  08/24/2019   Procedure: BUBBLE STUDY;  Surgeon: [name], MD;  Location: [place];  Service: Cardiology;;  . CARDIAC CATHETERIZATION    . CLIPPING OF ATRIAL APPENDAGE N/A 09/28/2019   Procedure: CLIPPING OF ATRIAL APPENDAGE USING ATRICURE CLIP SIZE 45MM;  Surgeon: Rexene Alberts, MD;  Location: [place];  Service: Open Heart Surgery;  Laterality: N/A;  . COLONOSCOPY    . KNEE SURGERY     left at age 46 in [place]  . LYMPHADENECTOMY Bilateral 06/02/2019   Procedure: LYMPHADENECTOMY, PELVIC;  Surgeon: [name], MD;  [facility];  Service: Urology;  Laterality: Bilateral;  . MINIMALLY INVASIVE TRICUSPID VALVE REPAIR Right 09/28/2019   Procedure: possible MINIMALLY INVASIVE TRICUSPID VALVE REPAIR;  Surgeon: Rexene Alberts, MD;  Location: [place];  Service: Open Heart Surgery;  Laterality: Right;  . MITRAL VALVE REPAIR Right 09/28/2019   Procedure: MINIMALLY INVASIVE MITRAL VALVE REPAIR (MVR) USING SIMUFORM 36MM;  Surgeon: Rexene Alberts, MD;  Location: [place];  Service: Open Heart Surgery;  Laterality: Right;  . PROSTATE BIOPSY    . RIGHT/LEFT HEART CATH AND CORONARY ANGIOGRAPHY N/A 09/07/2019   Procedure: RIGHT/LEFT HEART CATH AND CORONARY ANGIOGRAPHY;  Surgeon: Sherren Mocha, MD;  Location: [place] [facility];  Service: Cardiovascular;  Laterality: N/A;  . ROBOT ASSISTED LAPAROSCOPIC RADICAL PROSTATECTOMY N/A 06/02/2019    Procedure: XI ROBOTIC ASSISTED LAPAROSCOPIC RADICAL PROSTATECTOMY LEVEL 2;  Surgeon: [name], MD;  [facility];  Service: Urology;  Laterality: N/A;  . spinal injection     December 2013  . TEE WITHOUT CARDIOVERSION N/A 08/24/2019   Procedure: TRANSESOPHAGEAL ECHOCARDIOGRAM (TEE);  Surgeon: [name], MD;  Location: [place];  Service: Cardiology;  Laterality: N/A;  . TEE WITHOUT CARDIOVERSION N/A 09/28/2019   Procedure: TRANSESOPHAGEAL ECHOCARDIOGRAM (TEE);  Surgeon: Rexene Alberts, MD;  Location: [place];  Service: Open Heart Surgery;  Laterality: N/A;  . TOTAL HIP ARTHROPLASTY Right 01/29/2017   Procedure: RIGHT TOTAL HIP ARTHROPLASTY ANTERIOR APPROACH;  Surgeon: Rod Can, MD;  [facility];  Service: Orthopedics;  Laterality: Right;  Marland Kitchen VASECTOMY      Current Outpatient Medications  Medication Sig Dispense Refill Last Dose  . apixaban (ELIQUIS) 5 MG TABS tablet Take 1 tablet (5 mg total) by mouth 2 (two) times daily. 60 tablet 5   . ascorbic acid (VITAMIN C) 500 MG tablet Take 500 mg by mouth daily.      . digoxin (LANOXIN) 0.125 MG tablet Take 1 tablet twice a day for 2 days and then daily 92 tablet 3   . ferrous [id number] C-folic acid (TRINSICON / FOLTRIN) capsule Take 1 capsule by mouth daily with breakfast. 30 capsule 1   . metoprolol tartrate (LOPRESSOR) 25 MG tablet Take 1 tablet (25 mg total) by mouth 2 (two) times daily. 180 tablet 3   . Multiple Vitamins-Minerals (MULTIVITAMIN WITH MINERALS) tablet Take 1 tablet by mouth daily. [facility] men      . oxyCODONE-acetaminophen (PERCOCET) 10-325 MG tablet Take 1 tablet by mouth as needed for pain. Pt takes half tablet     . polyethylene glycol (MIRALAX / GLYCOLAX) 17 g packet Take 17 g by mouth daily.     . rosuvastatin (CRESTOR) 5 MG tablet Take 5 mg by mouth daily.     Marland Kitchen zolpidem (AMBIEN CR) 12.5 MG CR tablet Take 12.5 mg by mouth at bedtime.      No current facility-administered medications for  this visit.   No Known Allergies  Social History   Tobacco Use  . Smoking status: Never Smoker  . Smokeless tobacco: Never Used  Substance Use Topics  . Alcohol use: No    Family History  Problem Relation Age of Onset  . Arrhythmia Father        tachycardia  . Cancer Mother   . Heart attack Mother   . Diabetes Mother   . Breast cancer Sister   . Breast cancer Paternal Aunt   . Colon cancer Neg Hx   . Esophageal cancer Neg Hx   . Stomach cancer Neg Hx   . Rectal cancer Neg Hx      Review of Systems  Musculoskeletal: Positive for arthralgias.  All other systems reviewed and are negative.   Objective:  Physical Exam HENT:     Head: Normocephalic.  Eyes:     Pupils: Pupils are equal, round, and reactive to light.  Cardiovascular:     Rate and Rhythm: Normal rate.     Pulses: Normal pulses.  Pulmonary:  Effort: Pulmonary effort is normal.  Abdominal:     Palpations: Abdomen is soft.     Tenderness: There is no abdominal tenderness.  Genitourinary:    Comments: Deferred Musculoskeletal:     Cervical back: Normal range of motion.     Comments: Limited ROM left hip due to pain  Skin:    General: Skin is warm and dry.  Neurological:     Mental Status: He is alert and oriented to person, place, and time.  Psychiatric:        Mood and Affect: Mood normal.     Vital signs in last 24 hours: @VSRANGES @  Labs:   Estimated body mass index is 21.97 kg/m as calculated from the following:   Height as of 04/11/20: 6\' 3"  (1.905 m).   Weight as of 04/11/20: 79.7 kg.   Imaging Review Plain radiographs demonstrate severe degenerative joint disease of the left hip(s). The bone quality appears to be adequate for age and reported activity level.      Assessment/Plan:  End stage arthritis, left hip(s)  The patient history, physical examination, clinical judgement of the provider and imaging studies are consistent with end stage degenerative joint disease of the  left hip(s) and total hip arthroplasty is deemed medically necessary. The treatment options including medical management, injection therapy, arthroscopy and arthroplasty were discussed at length. The risks and benefits of total hip arthroplasty were presented and reviewed. The risks due to aseptic loosening, infection, stiffness, dislocation/subluxation,  thromboembolic complications and other imponderables were discussed.  The patient acknowledged the explanation, agreed to proceed with the plan and consent was signed. Patient is being admitted for inpatient treatment for surgery, pain control, PT, OT, prophylactic antibiotics, VTE prophylaxis, progressive ambulation and ADL's and discharge planning.The patient is planning to be discharged home after overnight observation

## 2020-05-29 ENCOUNTER — Ambulatory Visit (INDEPENDENT_AMBULATORY_CARE_PROVIDER_SITE_OTHER): Payer: Medicare Other | Admitting: Cardiovascular Disease

## 2020-05-29 ENCOUNTER — Encounter: Payer: Self-pay | Admitting: Cardiovascular Disease

## 2020-05-29 ENCOUNTER — Other Ambulatory Visit: Payer: Self-pay

## 2020-05-29 VITALS — BP 114/72 | HR 79 | Ht 75.0 in | Wt 172.2 lb

## 2020-05-29 DIAGNOSIS — I6529 Occlusion and stenosis of unspecified carotid artery: Secondary | ICD-10-CM | POA: Insufficient documentation

## 2020-05-29 DIAGNOSIS — I2510 Atherosclerotic heart disease of native coronary artery without angina pectoris: Secondary | ICD-10-CM | POA: Diagnosis not present

## 2020-05-29 DIAGNOSIS — I7121 Aneurysm of the ascending aorta, without rupture: Secondary | ICD-10-CM

## 2020-05-29 DIAGNOSIS — I4819 Other persistent atrial fibrillation: Secondary | ICD-10-CM | POA: Diagnosis not present

## 2020-05-29 DIAGNOSIS — I361 Nonrheumatic tricuspid (valve) insufficiency: Secondary | ICD-10-CM

## 2020-05-29 DIAGNOSIS — I4821 Permanent atrial fibrillation: Secondary | ICD-10-CM | POA: Diagnosis not present

## 2020-05-29 DIAGNOSIS — M1611 Unilateral primary osteoarthritis, right hip: Secondary | ICD-10-CM

## 2020-05-29 DIAGNOSIS — I712 Thoracic aortic aneurysm, without rupture: Secondary | ICD-10-CM

## 2020-05-29 DIAGNOSIS — Z9889 Other specified postprocedural states: Secondary | ICD-10-CM | POA: Diagnosis not present

## 2020-05-29 DIAGNOSIS — R001 Bradycardia, unspecified: Secondary | ICD-10-CM | POA: Diagnosis not present

## 2020-05-29 HISTORY — DX: Thoracic aortic aneurysm, without rupture: I71.2

## 2020-05-29 HISTORY — DX: Atherosclerotic heart disease of native coronary artery without angina pectoris: I25.10

## 2020-05-29 HISTORY — DX: Occlusion and stenosis of unspecified carotid artery: I65.29

## 2020-05-29 HISTORY — DX: Aneurysm of the ascending aorta, without rupture: I71.21

## 2020-05-29 NOTE — Patient Instructions (Addendum)
Medication Instructions:  Your physician recommends that you continue on your current medications as directed. Please refer to the Current Medication list given to you today.   *If you need a refill on your cardiac medications before your next appointment, please call your pharmacy*  Lab Work: NONE   Testing/Procedures: 3 DAY ZIO MONITOR THIS WILL BE MAILED TO YOU   Follow-Up: At [facility], you and your health needs are our priority.  As part of our continuing mission to provide you with exceptional heart care, we have created designated Provider Care Teams.  These Care Teams include your primary Cardiologist (physician) and Advanced Practice Providers (APPs -  Physician Assistants and Nurse Practitioners) who all work together to provide you with the care you need, when you need it.  We recommend signing up for the patient portal called "MyChart".  Sign up information is provided on this After Visit Summary.  MyChart is used to connect with patients for Virtual Visits (Telemedicine).  Patients are able to view lab/test results, encounter notes, upcoming appointments, etc.  Non-urgent messages can be sent to your provider as well.   To learn more about what you can do with MyChart, go to [email or web address].    Your next appointment:   6 month(s)  The format for your next appointment:   In Person  Provider:   DR [place]   Other Instructions  [place] 3 DAYS PRIOR TO PROCEDURE  ZIO XT- Long Term Monitor Instructions   Your physician has requested you wear your ZIO patch monitor___3___days.   This is a single patch monitor.  [employer] supplies one patch monitor per enrollment.  Additional stickers are not available.   Please do not apply patch if you will be having a Nuclear Stress Test, Echocardiogram, Cardiac CT, MRI, or Chest Xray during the time frame you would be wearing the monitor. The patch  cannot be worn during these tests.  You cannot remove and re-apply the ZIO XT patch monitor.   Your ZIO patch monitor will be sent USPS Priority mail from [facility] directly to your home address. The monitor may also be mailed to a PO BOX if home delivery is not available.   It may take 3-5 days to receive your monitor after you have been enrolled.   Once you have received you monitor, please review enclosed instructions.  Your monitor has already been registered assigning a specific monitor serial # to you.   Applying the monitor   Shave hair from upper left chest.   Hold abrader disc by orange tab.  Rub abrader in 40 strokes over left upper chest as indicated in your monitor instructions.   Clean area with 4 enclosed alcohol pads .  Use all pads to assure are is cleaned thoroughly.  Let dry.   Apply patch as indicated in monitor instructions.  Patch will be place under collarbone on left side of chest with arrow pointing upward.   Rub patch adhesive wings for 2 minutes.Remove white label marked "1".  Remove white label marked "2".  Rub patch adhesive wings for 2 additional minutes.   While looking in a mirror, press and release button in center of patch.  A small green light will flash 3-4 times .  This will be your only indicator the monitor has been turned on.     Do not shower for the first 24 hours.  You may shower  after the first 24 hours.   Press button if you feel a symptom. You will hear a small click.  Record Date, Time and Symptom in the Patient Log Book.   When you are ready to remove patch, follow instructions on last 2 pages of Patient Log Book.  Stick patch monitor onto last page of Patient Log Book.   Place Patient Log Book in [place] box.  Use locking tab on box and tape box closed securely.  The [employer] has [employer] on it.  Please place in mailbox as soon as possible.  Your physician should have your test results approximately 7 days after the  monitor has been mailed back to [facility].   Call [place] at [phone] if you have questions regarding your ZIO XT patch monitor.  Call them immediately if you see an orange light blinking on your monitor.   If your monitor falls off in less than 4 days contact our Monitor department at [phone].  If your monitor becomes loose or falls off after 4 days call [employer] at [phone] for suggestions on securing your monitor.

## 2020-05-29 NOTE — Assessment & Plan Note (Signed)
Mild.  Repeat carotid Dopplers 09/2020.  Continue rosuvastatin.

## 2020-05-29 NOTE — Assessment & Plan Note (Addendum)
Rates are much more stable since reducing metoprolol and adding digoxin.  He is feeling quite well.  Unable to tell much about his exercise tolerance given his hip pain.  We will get a 3-day ZIO to make sure he is not having prolonged pauses with sleep.  Continue current regime of metoprolol, digoxin, and Eliquis.  LAA was clipped.

## 2020-05-29 NOTE — Assessment & Plan Note (Signed)
Low risk for surgery.  Okay to hold Eliquis 3 days prior.

## 2020-05-29 NOTE — Progress Notes (Signed)
Cardiology Office Note  Morning Date:  05/29/2020   ID:  Raymond Ray, DOB [birth year], MRN [id number]  PCP:  Crist Infante, MD  Cardiologist:  [name], MD  Electrophysiologist:  None   Evaluation Performed:  Follow-Up Visit  Chief Complaint:  Atrial fibrillation  History of Present Illness:    Raymond Ray is a 70 y.o. male with chronic atrial fibrillation (s/p LAA clipping) and mitral regurgitation 2/2 MVP s/p mitral valve repair, mild carotid stenosis, mild CAD, who presents for follow up.  [name] was previously a patient of Dr. Mare Ferrari.  He first developed atrial fibrillation in 1986.  He was asymptomatic and it was discovered on physical exam.  He was initially treated with amiodarone and this was discontinued due to concern for long-term toxicity.  He was then on quinidine and this was transitioned to digoxin when he moved from [place] to the [place]. [name] had an echo 10/09/09 that showed normal systolic function and posterior mitral valve prolapse with moderate mitral regurgitation and normal PA pressures.  He had a nuclear stress 04/29/02 showing no ischemia and an ejection fraction of 55%.  He developed epistaxis on Xarelto and switched back to Coumadin with an INR goal of 1.8-2.2.  However,  He switch to Eliquis with no overt bleeding.  He was noted to have a low iron levels and anemia.  He was started on iron supplementation and has continued on Eliquis.  He had a colonoscopy 08/2018 that revealed diverticuli and a 3 mm polyp that was removed.  At his last appointment he reported some mild exertional dyspnea.  He had a repeat echo 07/2019 that revealed LVEF 50 to 55%.  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  He underwent minimally invasive mitral valve repair with Dr. Roxy Manns on 09/2019.  He had a 36 mm Medtronic Sinuform Ring and the left atrial appendage was clipped.  He was switched back to warfarin for valvular atrial  fibrillation.  [name] had fast heart rate with exercise. He wore a monitor 03/28/20 that showed his heart rate increase to 201 with up to 4 second pauses at night. He had an EET on 04/27/20 with poorly control heart rate with 1 mm ST depression. Metrolol was reduces but Digoxin was restarted. He reports that his trip was great and he is doing well. He notes that his HR has been okay. He has hip surgery scheduled for 06/20/20 and will need a surgical clearance soon. He has not been completing formal exercise much due to the pain in his hip. He has some ankle swelling for a few days but it has since resolved. He has pain when wearing flip flops. He has no PND, orthopnea, or palpitations. He has no exertional chest pain, pressure, or tightness.   Past Medical History:  Diagnosis Date  . Allergy   . Anemia   . Ascending aortic aneurysm ([place]) 05/29/2020  . CAD in native artery 05/29/2020   35% LAD on cath.  LDL was 74 on 04/2018.  LDL goal is less than 70.  It was 46 on 12/2019.  Continue rosuvastatin.  . Carotid stenosis 05/29/2020  . Chronic atrial fibrillation ([name])   . Exertional dyspnea 04/11/2020  . Heart murmur   . History of colonoscopy 04/2001   negative  . Mitral valve prolapse   . Nonrheumatic mitral (valve) insufficiency   . Osteoarthritis of hip   . Permanent atrial fibrillation ([name])   .  PONV (postoperative nausea and vomiting)    left knee cartilage removal;    . Prostate cancer ([place])   . 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 Tricuspid regurgitation   . Vertigo 01/07/2017   currently not symptomatic    Past Surgical History:  Procedure Laterality Date  . BUBBLE STUDY  08/24/2019   Procedure: BUBBLE STUDY;  Surgeon: [name], MD;  Location: [facility];  Service: Cardiology;;  . CARDIAC  CATHETERIZATION    . CLIPPING OF ATRIAL APPENDAGE N/A 09/28/2019   Procedure: CLIPPING OF ATRIAL APPENDAGE USING ATRICURE CLIP SIZE 45MM;  Surgeon: Rexene Alberts, MD;  Location: [facility];  Service: Open Heart Surgery;  Laterality: N/A;  . COLONOSCOPY    . KNEE SURGERY     left at age 24 in [place]  . LYMPHADENECTOMY Bilateral 06/02/2019   Procedure: LYMPHADENECTOMY, PELVIC;  Surgeon: [name], MD;  Location: [facility];  Service: Urology;  Laterality: Bilateral;  . MINIMALLY INVASIVE TRICUSPID VALVE REPAIR Right 09/28/2019   Procedure: possible MINIMALLY INVASIVE TRICUSPID VALVE REPAIR;  Surgeon: Rexene Alberts, MD;  Location: [facility];  Service: Open Heart Surgery;  Laterality: Right;  . MITRAL VALVE REPAIR Right 09/28/2019   Procedure: MINIMALLY INVASIVE MITRAL VALVE REPAIR (MVR) USING SIMUFORM 36MM;  Surgeon: Rexene Alberts, MD;  Location: [facility];  Service: Open Heart Surgery;  Laterality: Right;  . PROSTATE BIOPSY    . RIGHT/LEFT HEART CATH AND CORONARY ANGIOGRAPHY N/A 09/07/2019   Procedure: RIGHT/LEFT HEART CATH AND CORONARY ANGIOGRAPHY;  Surgeon: Sherren Mocha, MD;  Location: [facility];  Service: Cardiovascular;  Laterality: N/A;  . ROBOT ASSISTED LAPAROSCOPIC RADICAL PROSTATECTOMY N/A 06/02/2019   Procedure: XI ROBOTIC ASSISTED LAPAROSCOPIC RADICAL PROSTATECTOMY LEVEL 2;  Surgeon: [name], MD;  [facility];  Service: Urology;  Laterality: N/A;  . spinal injection     December 2013  . TEE WITHOUT CARDIOVERSION N/A 08/24/2019   Procedure: TRANSESOPHAGEAL ECHOCARDIOGRAM (TEE);  Surgeon: [name], MD;  Location: [facility];  Service: Cardiology;  Laterality: N/A;  . TEE WITHOUT CARDIOVERSION N/A 09/28/2019   Procedure: TRANSESOPHAGEAL ECHOCARDIOGRAM (TEE);  Surgeon: Rexene Alberts, MD;  Location: [facility];  Service: Open Heart Surgery;  Laterality: N/A;  . TOTAL HIP ARTHROPLASTY Right 01/29/2017   Procedure: RIGHT TOTAL HIP ARTHROPLASTY ANTERIOR APPROACH;  Surgeon:  Rod Can, MD;  [facility];  Service: Orthopedics;  Laterality: Right;  [place] Kitchen VASECTOMY       Current Meds  Medication Sig  . apixaban (ELIQUIS) 5 MG TABS tablet Take 1 tablet (5 mg total) by mouth 2 (two) times daily.  [place] Kitchen ascorbic acid (VITAMIN C) 500 MG tablet Take 500 mg by mouth daily.   . digoxin (LANOXIN) 0.125 MG tablet Take 1 tablet twice a day for 2 days and then daily  . ferrous [id number] C-folic acid (TRINSICON / FOLTRIN) capsule Take 1 capsule by mouth daily with breakfast.  . fexofenadine (ALLEGRA) 180 MG tablet   . metoprolol tartrate (LOPRESSOR) 25 MG tablet Take 1 tablet (25 mg total) by mouth 2 (two) times daily.  . Multiple Vitamins-Minerals (MULTIVITAMIN WITH MINERALS) tablet Take 1 tablet by mouth daily. [facility]S men   . oxyCODONE-acetaminophen (PERCOCET) 10-325 MG tablet Take 1 tablet by mouth as needed for pain. Pt takes half tablet  . polyethylene glycol (MIRALAX / GLYCOLAX) 17  g packet Take 17 g by mouth daily.  . rosuvastatin (CRESTOR) 5 MG tablet Take 5 mg by mouth daily.  . sildenafil (VIAGRA) 100 MG tablet TAKE ONE TABLET BY MOUTH DAILY 30 MINUTES PRIOR TO INTERCOURSE  . zolpidem (AMBIEN) 5 MG tablet Take 5 mg by mouth at bedtime.     Allergies:   Patient has no known allergies.   Social History   Tobacco Use  . Smoking status: Never Smoker  . Smokeless tobacco: Never Used  Vaping Use  . Vaping Use: Never used  Substance Use Topics  . Alcohol use: No  . Drug use: No     Family Hx: The patient's family history includes Arrhythmia in his father; Breast cancer in his paternal aunt and sister; Cancer in his mother; Diabetes in his mother; Heart attack in his mother. There is no history of Colon cancer, Esophageal cancer, Stomach cancer, or Rectal cancer.  ROS:   Please see the history of present illness.  All other systems reviewed and are negative.   Prior CV studies:   The following studies were reviewed today:  Echo 09/2019: 1.  Normal LV function; s/p MV repair with mild MS (mean gradient 5 mm Hg;  MVA 2.1 cm2) and no MR; moderate biatrial enlargement.  2. Left ventricular ejection fraction, by estimation, is 50 to 55%. The  left ventricle has low normal function. The left ventricle has no regional  wall motion abnormalities.  3. Right ventricular systolic function is normal. The right ventricular  size is normal.  4. Left atrial size was moderately dilated.  5. Right atrial size was moderately dilated.  6. The mitral valve has been repaired/replaced. Trivial mitral valve  regurgitation. Mild mitral stenosis.  7. The aortic valve is tricuspid. Aortic valve regurgitation is not  visualized. No aortic stenosis is present.  8. The inferior vena cava is dilated in size with >50% respiratory  variability, suggesting right atrial pressure of 8 mmHg.   Echo 07/2019: 1. Left ventricular ejection fraction, by estimation, is 50 to 55%. The  left ventricle has low normal function. The left ventricle has no regional  wall motion abnormalities. Left ventricular diastolic function could not  be evaluated.  2. Right ventricular systolic function is normal. The right ventricular  size is normal. There is moderately elevated pulmonary artery systolic  pressure.  3. Left atrial size was severely dilated.  4. Right atrial size was severely dilated.  5. The mitral valve is myxomatous. Moderate to severe mitral valve  regurgitation.  6. Tricuspid valve regurgitation is mild to moderate.  7. The aortic valve is normal in structure. Aortic valve regurgitation is  not visualized. No aortic stenosis is present.  8. Aortic dilatation noted. 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   LHC 08/2019: 1.  Calcified nonobstructive proximal LAD stenosis 2.  Patent coronary arteries with mild diffuse irregularity and no flow-limiting coronary stenoses 3.  Normal right heart filling pressures with  preserved cardiac output 4.  20 mm V wave consistent with the patient's known mitral regurgitation  Recommendation: Continued plans for cardiac surgical evaluation for mitral valve repair  Carotid Dopplers 09/2019: 1 to 39% ICA stenosis bilaterally.   Labs/Other Tests and Data Reviewed:    EKG:  05/29/20-Atrial Fibrillation. Rate 79 bpm.   An ECG dated 10/14/2019 was personally reviewed today and demonstrated:  Atrial fibrillation.  Rate 80 bpm.  04/11/2020: Atrial fibrillation.  Rate 88 bpm.  Recent Labs: 08/11/2019:  NT-Pro BNP 1,865 09/26/2019: ALT 12 10/03/2019: BUN 25; Hemoglobin 8.2; Platelets 248; Potassium 4.0; Sodium 137 10/14/2019: Creatinine, Ser 2.30 04/27/2020: Magnesium 2.0   Recent Lipid Panel Lab Results  Component Value Date/Time   CHOL 166 03/03/2011 09:22 AM   TRIG 41.0 03/03/2011 09:22 AM   HDL 55.80 03/03/2011 09:22 AM   CHOLHDL 3 03/03/2011 09:22 AM   LDLCALC 102 (H) 03/03/2011 09:22 AM    04/30/2018: Total cholesterol 132, triglycerides 64, HDL 54, LDL 73 Sodium 140, BUN 28, creatinine 1.3 AST 18, ALT 15 TSH 1.37  07/08/2018: Hemoglobin 12.2  04/03/20 14 Day Event Monitor  Quality: Fair.  Baseline artifact. Predominant rhythm: atrial fibrillation Average heart rate: 86 bpm Max heart rate: 201 bpm Min heart rate: 36 bpm Pauses >2.5 seconds: Pauses up to 4.3 seconds.  Pauses noted overnight/early morning.  Up to 13 beats NSVT.  Max rate 250 bpm.  04/27/20  Blood pressure demonstrated a normal response to exercise.  Horizontal ST segment depression ST segment depression of 1 mm was noted during stress in the III, II, aVF, V6 and V5 leads, beginning at 4 minutes of stress, and returning to baseline after 1-5 minutes of recovery. Abnormal exercise stress test. Exaggerated heart rate response due to atrial fibrillation. There is borderline ST segment depression during exercise that resolves fairly rapidly in recovery. Although "digoxin effect" is not  seen on the baseline tracing, cannot exclude "false positive response" due to digoxin.   Wt Readings from Last 3 Encounters:  05/29/20 172 lb 3.2 oz (78.1 kg)  04/11/20 175 lb 12.8 oz (79.7 kg)  01/27/20 170 lb 13.7 oz (77.5 kg)     Objective:    VS:  BP 114/72   Pulse 79   Ht 6\' 3"  (1.905 m)   Wt 172 lb 3.2 oz (78.1 kg)   SpO2 100%   BMI 21.52 kg/m  , BMI Body mass index is 21.52 kg/m. GENERAL:  Well appearing HEENT: Pupils equal round and reactive, fundi not visualized, oral mucosa unremarkable NECK:  No jugular venous distention, waveform within normal limits, carotid upstroke brisk and symmetric, no bruits LUNGS:  Clear to auscultation bilaterally HEART: Irregularly irregular PMI not displaced or sustained,S1 and S2 within normal limits, no S3, no S4, no clicks, no rubs, no murmurs ABD:  Flat, positive bowel sounds normal in frequency in pitch, no bruits, no rebound, no guarding, no midline pulsatile mass, no hepatomegaly, no splenomegaly EXT:  2 plus pulses throughout, no edema, no cyanosis no clubbing SKIN:  No rashes no nodules NEURO:  Cranial nerves II through XII grossly intact, motor grossly intact throughout PSYCH:  Cognitively intact, oriented to person place and time  ASSESSMENT & PLAN:    Permanent atrial fibrillation (HCC) Rates are much more stable since reducing metoprolol and adding digoxin.  He is feeling quite well.  Unable to tell much about his exercise tolerance given his hip pain.  We will get a 3-day ZIO to make sure he is not having prolonged pauses with sleep.  Continue current regime of metoprolol, digoxin, and Eliquis.  LAA was clipped.  CAD in native artery 35% LAD on cath.  LDL was 74 on 04/2018.  LDL goal is less than 70.  It was 46 on 12/2019.  Continue rosuvastatin.  Ascending aortic aneurysm ([facility]CC) 3.8 cm on 07/2019.  Osteoarthritis of right hip Low risk for surgery.  Okay to hold Eliquis 3 days prior.  Carotid stenosis Mild.  Repeat  carotid Dopplers 09/2020.  Continue rosuvastatin.    Medication Adjustments/Labs and Tests Ordered: Current medicines are reviewed at length with the patient today.  Concerns regarding medicines are outlined above.   Tests Ordered: Orders Placed This Encounter  Procedures  . LONG TERM MONITOR (3-14 DAYS)  . EKG 12-Lead    Medication Changes: No orders of the defined types were placed in this encounter.   Disposition:  Follow up in 6 months  I,Alexis Bryant,acting as a scribe for [name], MD.,have documented all relevant documentation on the behalf of [name], MD,as directed by  [name], MD while in the presence of [name], MD.  Signed, [name], MD  05/29/2020 4:34 PM    [place]

## 2020-05-29 NOTE — Assessment & Plan Note (Signed)
35% LAD on cath.  LDL was 74 on 04/2018.  LDL goal is less than 70.  It was 46 on 12/2019.  Continue rosuvastatin.

## 2020-05-29 NOTE — Assessment & Plan Note (Signed)
3.8 cm on 07/2019.

## 2020-05-30 ENCOUNTER — Ambulatory Visit (INDEPENDENT_AMBULATORY_CARE_PROVIDER_SITE_OTHER): Payer: Medicare Other

## 2020-05-30 ENCOUNTER — Encounter: Payer: Self-pay | Admitting: *Deleted

## 2020-05-30 DIAGNOSIS — I4819 Other persistent atrial fibrillation: Secondary | ICD-10-CM

## 2020-05-30 DIAGNOSIS — R001 Bradycardia, unspecified: Secondary | ICD-10-CM

## 2020-05-30 NOTE — Progress Notes (Signed)
Patient ID: Raymond Ray, male   DOB: 07-Mar-1950, 70 y.o.   MRN: [id number] Patient enrolled for [employer] to ship a 3 day ZIO XT to his home.

## 2020-06-02 DIAGNOSIS — R001 Bradycardia, unspecified: Secondary | ICD-10-CM | POA: Diagnosis not present

## 2020-06-02 DIAGNOSIS — I4819 Other persistent atrial fibrillation: Secondary | ICD-10-CM | POA: Diagnosis not present

## 2020-06-08 DIAGNOSIS — I4819 Other persistent atrial fibrillation: Secondary | ICD-10-CM | POA: Diagnosis not present

## 2020-06-08 DIAGNOSIS — R001 Bradycardia, unspecified: Secondary | ICD-10-CM | POA: Diagnosis not present

## 2020-06-15 NOTE — Patient Instructions (Addendum)
DUE TO COVID-19 ONLY ONE VISITOR IS ALLOWED TO COME WITH YOU AND STAY IN THE WAITING ROOM ONLY DURING PRE OP AND PROCEDURE DAY OF SURGERY. THE 1 VISITOR  MAY VISIT WITH YOU AFTER SURGERY IN YOUR PRIVATE ROOM DURING VISITING HOURS ONLY!  YOU NEED TO HAVE A COVID 19 TEST ON_4/25______ @__11 :20_____, THIS TEST MUST BE DONE BEFORE SURGERY,  COVID TESTING SITE [place] [employer] [id number], IT IS ON THE RIGHT GOING OUT [address] APPROXIMATELY  2 MINUTES PAST [employer] ON THE RIGHT. ONCE YOUR COVID TEST IS COMPLETED,  PLEASE BEGIN THE QUARANTINE INSTRUCTIONS AS OUTLINED IN YOUR HANDOUT.                Raymond Ray   Your procedure is scheduled on: 06/20/20   Report to [facility] Main  Entrance   Report to Short stay at 5:15 AM     Call this number if you have problems the morning of surgery [place], NO CHEWING GUM [place].    No food after midnight  .  You may have clear liquid until 4:30 AM.    At 4:30 AM drink pre surgery drink  . Nothing by mouth after 4:30 AM.   Take these medicines the morning of surgery with A SIP OF WATER: Metoprolol, Digoxin                                 You may not have any metal on your body including              piercings  Do not wear jewelry,  lotions, powders or deodorant              Men may shave face and neck.   Do not bring valuables to the hospital. [place].  Contacts, dentures or bridgework may not be worn into surgery.      Patients discharged the day of surgery will not be allowed to drive home.  IF YOU ARE HAVING SURGERY AND GOING HOME THE SAME DAY, YOU MUST HAVE AN ADULT TO DRIVE YOU HOME AND BE WITH YOU FOR 24 HOURS. YOU MAY GO HOME BY TAXI OR UBER OR ORTHERWISE, BUT AN ADULT MUST ACCOMPANY YOU HOME AND STAY WITH YOU FOR 24 HOURS.  Name and phone number of your  driver:  Special Instructions: N/A              Please read over the following fact sheets you were given: _____________________________________________________________________             [facility] - Preparing for Surgery Before surgery, you can play an important role.  Because skin is not sterile, your skin needs to be as free of germs as possible.  You can reduce the number of germs on your skin by washing with CHG (chlorahexidine gluconate) soap before surgery.  CHG is an antiseptic cleaner which kills germs and bonds with the skin to continue killing germs even after washing. Please DO NOT use if you have an allergy to CHG or antibacterial soaps.  If your skin becomes reddened/irritated stop using the CHG and inform your nurse when you arrive at Short Stay..  You may shave  your face/neck. Please follow these instructions carefully:  1.  Shower with CHG Soap the night before surgery and the  morning of Surgery.  2.  If you choose to wash your hair, wash your hair first as usual with your  normal  shampoo.  3.  After you shampoo, rinse your hair and body thoroughly to remove the  shampoo.                                        4.  Use CHG as you would any other liquid soap.  You can apply chg directly  to the skin and wash                       Gently with a scrungie or clean washcloth.  5.  Apply the CHG Soap to your body ONLY FROM THE NECK DOWN.   Do not use on face/ open                           Wound or open sores. Avoid contact with eyes, ears mouth and genitals (private parts).                       Wash face,  Genitals (private parts) with your normal soap.             6.  Wash thoroughly, paying special attention to the area where your surgery  will be performed.  7.  Thoroughly rinse your body with warm water from the neck down.  8.  DO NOT shower/wash with your normal soap after using and rinsing off  the CHG Soap.             9.  Pat yourself dry with a clean towel.             10.  Wear clean pajamas.            11.  Place clean sheets on your bed the night of your first shower and do not  sleep with pets. Day of Surgery : Do not apply any lotions/deodorants the morning of surgery.  Please wear clean clothes to the hospital/surgery center.  FAILURE TO FOLLOW THESE INSTRUCTIONS MAY RESULT IN THE CANCELLATION OF YOUR SURGERY PATIENT SIGNATURE_________________________________  NURSE SIGNATURE__________________________________  ________________________________________________________________________   Raymond Ray  An incentive spirometer is a tool that can help keep your lungs clear and active. This tool measures how well you are filling your lungs with each breath. Taking long deep breaths may help reverse or decrease the chance of developing breathing (pulmonary) problems (especially infection) following:  A long period of time when you are unable to move or be active. BEFORE THE PROCEDURE   If the spirometer includes an indicator to show your best effort, your nurse or respiratory therapist will set it to a desired goal.  If possible, sit up straight or lean slightly forward. Try not to slouch.  Hold the incentive spirometer in an upright position. INSTRUCTIONS FOR USE  1. Sit on the edge of your bed if possible, or sit up as far as you can in bed or on a chair. 2. Hold the incentive spirometer in an upright position. 3. Breathe out normally. 4. Place the mouthpiece in your mouth and seal your lips tightly around it. 5. Breathe in  slowly and as deeply as possible, raising the piston or the ball toward the top of the column. 6. Hold your breath for 3-5 seconds or for as long as possible. Allow the piston or ball to fall to the bottom of the column. 7. Remove the mouthpiece from your mouth and breathe out normally. 8. Rest for a few seconds and repeat Steps 1 through 7 at least 10 times every 1-2 hours when you are awake. Take your time and take a  few normal breaths between deep breaths. 9. The spirometer may include an indicator to show your best effort. Use the indicator as a goal to work toward during each repetition. 10. After each set of 10 deep breaths, practice coughing to be sure your lungs are clear. If you have an incision (the cut made at the time of surgery), support your incision when coughing by placing a pillow or rolled up towels firmly against it. Once you are able to get out of bed, walk around indoors and cough well. You may stop using the incentive spirometer when instructed by your caregiver.  RISKS AND COMPLICATIONS  Take your time so you do not get dizzy or light-headed.  If you are in pain, you may need to take or ask for pain medication before doing incentive spirometry. It is harder to take a deep breath if you are having pain. AFTER USE  Rest and breathe slowly and easily.  It can be helpful to keep track of a log of your progress. Your caregiver can provide you with a simple table to help with this. If you are using the spirometer at home, follow these instructions: [place] IF:   You are having difficultly using the spirometer.  You have trouble using the spirometer as often as instructed.  Your pain medication is not giving enough relief while using the spirometer.  You develop fever of 100.5 F (38.1 C) or higher. SEEK IMMEDIATE MEDICAL CARE IF:   You cough up bloody sputum that had not been present before.  You develop fever of 102 F (38.9 C) or greater.  You develop worsening pain at or near the incision site. MAKE SURE YOU:   Understand these instructions.  Will watch your condition.  Will get help right away if you are not doing well or get worse. Document Released: 06/23/2006 Document Revised: 05/05/2011 Document Reviewed: 08/24/2006 [facility] [facility] [employer] Patient Information 2014 [place], [place].   ________________________________________________________________________

## 2020-06-18 ENCOUNTER — Encounter (HOSPITAL_COMMUNITY)
Admission: RE | Admit: 2020-06-18 | Discharge: 2020-06-18 | Disposition: A | Discharge status: Home / Self Care | Payer: Medicare Other | Source: Ambulatory Visit | Attending: Orthopedic Surgery | Admitting: Orthopedic Surgery

## 2020-06-18 ENCOUNTER — Encounter (HOSPITAL_COMMUNITY): Payer: Self-pay

## 2020-06-18 ENCOUNTER — Other Ambulatory Visit: Payer: Self-pay

## 2020-06-18 ENCOUNTER — Other Ambulatory Visit (HOSPITAL_COMMUNITY)
Admission: RE | Admit: 2020-06-18 | Discharge: 2020-06-18 | Disposition: A | Discharge status: Home / Self Care | Payer: Medicare Other | Source: Ambulatory Visit | Attending: Orthopedic Surgery | Admitting: Orthopedic Surgery

## 2020-06-18 DIAGNOSIS — Z01812 Encounter for preprocedural laboratory examination: Secondary | ICD-10-CM | POA: Insufficient documentation

## 2020-06-18 DIAGNOSIS — Z20822 Contact with and (suspected) exposure to covid-19: Secondary | ICD-10-CM | POA: Diagnosis not present

## 2020-06-18 HISTORY — DX: Cardiac arrhythmia, unspecified: I49.9

## 2020-06-18 HISTORY — DX: Essential (primary) hypertension: I10

## 2020-06-18 LAB — PROTIME-INR
INR: 1.2 (ref 0.8–1.2)
Prothrombin Time: 15.6 seconds — ABNORMAL HIGH (ref 11.4–15.2)

## 2020-06-18 LAB — CBC
HCT: 32.9 % — ABNORMAL LOW (ref 39.0–52.0)
Hemoglobin: 10.9 g/dL — ABNORMAL LOW (ref 13.0–17.0)
MCH: 35.6 pg — ABNORMAL HIGH (ref 26.0–34.0)
MCHC: 33.1 g/dL (ref 30.0–36.0)
MCV: 107.5 fL — ABNORMAL HIGH (ref 80.0–100.0)
Platelets: 311 10*3/uL (ref 150–400)
RBC: 3.06 MIL/uL — ABNORMAL LOW (ref 4.22–5.81)
RDW: 14.6 % (ref 11.5–15.5)
WBC: 7.2 10*3/uL (ref 4.0–10.5)
nRBC: 0.3 % — ABNORMAL HIGH (ref 0.0–0.2)

## 2020-06-18 LAB — URINALYSIS, ROUTINE W REFLEX MICROSCOPIC
Bilirubin Urine: NEGATIVE
Glucose, UA: NEGATIVE mg/dL
Hgb urine dipstick: NEGATIVE
Ketones, ur: NEGATIVE mg/dL
Leukocytes,Ua: NEGATIVE
Nitrite: NEGATIVE
Protein, ur: NEGATIVE mg/dL
Specific Gravity, Urine: 1.029 (ref 1.005–1.030)
pH: 5 (ref 5.0–8.0)

## 2020-06-18 LAB — COMPREHENSIVE METABOLIC PANEL
ALT: 12 U/L (ref 0–44)
AST: 15 U/L (ref 15–41)
Albumin: 4 g/dL (ref 3.5–5.0)
Alkaline Phosphatase: 53 U/L (ref 38–126)
Anion gap: 8 (ref 5–15)
BUN: 22 mg/dL (ref 8–23)
CO2: 26 mmol/L (ref 22–32)
Calcium: 9.4 mg/dL (ref 8.9–10.3)
Chloride: 110 mmol/L (ref 98–111)
Creatinine, Ser: 1.03 mg/dL (ref 0.61–1.24)
GFR, Estimated: >60 mL/min (ref >60)
Glucose, Bld: 99 mg/dL (ref 70–99)
Potassium: 4.8 mmol/L (ref 3.5–5.1)
Sodium: 144 mmol/L (ref 135–145)
Total Bilirubin: 1.1 mg/dL (ref 0.3–1.2)
Total Protein: 6.8 g/dL (ref 6.5–8.1)

## 2020-06-18 LAB — SURGICAL PCR SCREEN
MRSA, PCR: NEGATIVE
Staphylococcus aureus: POSITIVE — AB

## 2020-06-18 NOTE — Progress Notes (Signed)
COVID Vaccine Completed:Yes Date COVID Vaccine completed:12/27/19- booster 04/27/19 COVID vaccine manufacturer: Pfizer   PCP - Dr. Jerilynn Mages. [place] Cardiologist - Dr/ T. [place]  Chest x-ray - no EKG - 05/29/20-epic Stress Test -04/27/20-epic  ECHO - 11/15/19-epic Cardiac Cath - 09/28/19-epic Pacemaker/ICD device last checked:NA  Sleep Study -no  CPAP -   Fasting Blood Sugar - NA Checks Blood Sugar _____ times a day  Blood Thinner Instructions:Eliquis/ Dr. Berneice Gandy Aspirin Instructions:stop 3 days prior to DOS/ Dr. Berneice Gandy Last Dose:06/17/20  Anesthesia review:   Patient denies shortness of breath, fever, cough and chest pain at PAT appointment  Yes. Pt has no SOB with any activities. Patient verbalized understanding of instructions that were given to them at the PAT appointment. Patient was also instructed that they will need to review over the PAT instructions again at home before surgery.

## 2020-06-19 LAB — SARS CORONAVIRUS 2 (TAT 6-24 HRS): SARS Coronavirus 2: NEGATIVE

## 2020-06-19 NOTE — Anesthesia Preprocedure Evaluation (Addendum)
Anesthesia Evaluation  Patient identified by MRN, date of birth, ID band Patient awake    Reviewed: Allergy & Precautions, NPO status , Patient's Chart, lab work & pertinent test results  History of Anesthesia Complications (+) PONV and history of anesthetic complications  Airway Mallampati: II  TM Distance: >3 FB Neck ROM: Full    Dental no notable dental hx.    Pulmonary neg pulmonary ROS,    Pulmonary exam normal breath sounds clear to auscultation       Cardiovascular hypertension, + CAD  Normal cardiovascular exam+ dysrhythmias (on Eliquis) Atrial Fibrillation + Valvular Problems/Murmurs (mitral valve repair 09/2019) MR and MVP  Rhythm:Regular Rate:Normal   EKG: 05/29/2020 Rate 79 bpm  Atrial fibrillation   CV: Echo 11/14/19 1. Left ventricular ejection fraction, by estimation, is 50 to 55%. The  left ventricle has low normal function. The left ventricle has no regional  wall motion abnormalities. Left ventricular diastolic function could not  be evaluated.  2. Right ventricular systolic function is normal. The right ventricular  size is normal. There is normal pulmonary artery systolic pressure. The  estimated right ventricular systolic pressure is 14.7 mmHg.  3. Left atrial size was severely dilated.  4. Right atrial size was severely dilated.  5. The mitral valve is myxomatous. Trivial mitral valve regurgitation. No  evidence of mitral stenosis. The mean mitral valve gradient is 2.7 mmHg  with average heart rate of 65 bpm. There is a prosthetic annuloplasty ring  present in the mitral position.  6. The aortic valve is tricuspid. Aortic valve regurgitation is not  visualized. No aortic stenosis is present.  7. Aortic dilatation noted. There is mild dilatation of the aortic root,  measuring 41 mm. There is mild dilatation of the ascending aorta,  measuring 39 mm.    Neuro/Psych  Neuromuscular disease  (radiculopathy) negative psych ROS   GI/Hepatic negative GI ROS, Neg liver ROS,   Endo/Other  negative endocrine ROS  Renal/GU negative Renal ROS  negative genitourinary   Musculoskeletal  (+) Arthritis , Osteoarthritis,    Abdominal   Peds  Hematology negative hematology ROS (+) anemia ,   Anesthesia Other Findings   Reproductive/Obstetrics negative OB ROS                           Anesthesia Physical Anesthesia Plan  ASA: III  Anesthesia Plan: Spinal   Post-op Pain Management:    Induction: Intravenous  PONV Risk Score and Plan: Propofol infusion, TIVA, Treatment may vary due to age or medical condition, Ondansetron and Dexamethasone  Airway Management Planned: Natural Airway and Simple Face Mask  Additional Equipment: None  Intra-op Plan:   Post-operative Plan: Extubation in OR  Informed Consent: I have reviewed the patients History and Physical, chart, labs and discussed the procedure including the risks, benefits and alternatives for the proposed anesthesia with the patient or authorized representative who has indicated his/her understanding and acceptance.       Plan Discussed with: CRNA and Anesthesiologist  Anesthesia Plan Comments: (Spinal if Eliquis appropriately held. GETA as backup plan. Norton Blizzard, MD  )      Anesthesia Quick Evaluation

## 2020-06-19 NOTE — Progress Notes (Signed)
Anesthesia Chart Review   Case: [id number] Date/Time: 06/20/20 0715   Procedure: TOTAL HIP ARTHROPLASTY ANTERIOR APPROACH (Left Hip)   Anesthesia type: Spinal   Pre-op diagnosis: left hip degenerative joint disease   Location: [facility] 07 / [facility]   Surgeons: Rod Can, MD      DISCUSSION:70 y.o. never smoker with h/o PONV, HTN, CAD, s/p mitral valve repair, atrial fibrillation (s/p LAA clipping, on Eliquis), AAA (3.8 cm on 07/2019), left hip djd scheduled for above procedure 06/20/20 with Dr. Rod Can.   Pt last seen by cardiology 05/29/2020. Per OV note, "Low risk for surgery.  Okay to hold Eliquis 3 days prior."  Anticipate pt can proceed with planned procedure barring acute status change.   VS: BP 109/72   Pulse 76   Temp 37.2 C (Oral)   Resp 20   Ht 6\' 3"  (1.905 m)   Wt 80.3 kg   SpO2 99%   BMI 22.12 kg/m   PROVIDERS: Crist Infante, MD is PCP   [name], MD is cardiologist  LABS: Labs reviewed: Acceptable for surgery. (all labs ordered are listed, but only abnormal results are displayed)  Labs Reviewed  SURGICAL PCR SCREEN - Abnormal; Notable for the following components:      Result Value   Staphylococcus aureus POSITIVE (*)    All other components within normal limits  CBC - Abnormal; Notable for the following components:   RBC 3.06 (*)    Hemoglobin 10.9 (*)    HCT 32.9 (*)    MCV 107.5 (*)    MCH 35.6 (*)    nRBC 0.3 (*)    All other components within normal limits  PROTIME-INR - Abnormal; Notable for the following components:   Prothrombin Time 15.6 (*)    All other components within normal limits  COMPREHENSIVE METABOLIC PANEL  URINALYSIS, ROUTINE W REFLEX MICROSCOPIC  TYPE AND SCREEN     IMAGES:   EKG: 05/29/2020 Rate 79 bpm  Atrial fibrillation   CV: Echo 11/14/19 1. Left ventricular ejection fraction, by estimation, is 50 to 55%. The  left ventricle has low normal function. The left ventricle has no regional  wall motion  abnormalities. Left ventricular diastolic function could not  be evaluated.  2. Right ventricular systolic function is normal. The right ventricular  size is normal. There is normal pulmonary artery systolic pressure. The  estimated right ventricular systolic pressure is 81.1 mmHg.  3. Left atrial size was severely dilated.  4. Right atrial size was severely dilated.  5. The mitral valve is myxomatous. Trivial mitral valve regurgitation. No  evidence of mitral stenosis. The mean mitral valve gradient is 2.7 mmHg  with average heart rate of 65 bpm. There is a prosthetic annuloplasty ring  present in the mitral position.  6. The aortic valve is tricuspid. Aortic valve regurgitation is not  visualized. No aortic stenosis is present.  7. Aortic dilatation noted. There is mild dilatation of the aortic root,  measuring 41 mm. There is mild dilatation of the ascending aorta,  measuring 39 mm.  Past Medical History:  Diagnosis Date  . Allergy   . Anemia    history of  . Ascending aortic aneurysm ([place]) 05/29/2020  . CAD in native artery 05/29/2020   35% LAD on cath.  LDL was 74 on 04/2018.  LDL goal is less than 70.  It was 46 on 12/2019.  Continue rosuvastatin.  . Carotid stenosis 05/29/2020  . Chronic atrial fibrillation ([place])   .  Dysrhythmia 1970s  . Exertional dyspnea 04/11/2020  . Heart murmur   . History of colonoscopy 04/2001   negative  . Hypertension   . Mitral valve prolapse   . Nonrheumatic mitral (valve) insufficiency   . Osteoarthritis of hip   . Permanent atrial fibrillation ([place])   . PONV (postoperative nausea and vomiting)    left knee cartilage removal;    . Prostate cancer ([place])   . 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 Tricuspid regurgitation   . Vertigo 01/07/2017   currently not symptomatic      Past Surgical History:  Procedure Laterality Date  . BUBBLE STUDY  08/24/2019   Procedure: BUBBLE STUDY;  Surgeon: [name], MD;  Location: [facility];  Service: Cardiology;;  . CARDIAC CATHETERIZATION  09/28/2019  . CLIPPING OF ATRIAL APPENDAGE N/A 09/28/2019   Procedure: CLIPPING OF ATRIAL APPENDAGE USING ATRICURE CLIP SIZE 45MM;  Surgeon: Rexene Alberts, MD;  Location: [facility];  Service: Open Heart Surgery;  Laterality: N/A;  . COLONOSCOPY    . KNEE SURGERY     left at age 14 in [place]  . LYMPHADENECTOMY Bilateral 06/02/2019   Procedure: LYMPHADENECTOMY, PELVIC;  Surgeon: [name], MD;  [facility];  Service: Urology;  Laterality: Bilateral;  . MINIMALLY INVASIVE TRICUSPID VALVE REPAIR Right 09/28/2019   Procedure: possible MINIMALLY INVASIVE TRICUSPID VALVE REPAIR;  Surgeon: Rexene Alberts, MD;  Location: [facility];  Service: Open Heart Surgery;  Laterality: Right;  . MITRAL VALVE REPAIR Right 09/28/2019   Procedure: MINIMALLY INVASIVE MITRAL VALVE REPAIR (MVR) USING SIMUFORM 36MM;  Surgeon: Rexene Alberts, MD;  Location: [facility];  Service: Open Heart Surgery;  Laterality: Right;  . PROSTATE BIOPSY    . RIGHT/LEFT HEART CATH AND CORONARY ANGIOGRAPHY N/A 09/07/2019   Procedure: RIGHT/LEFT HEART CATH AND CORONARY ANGIOGRAPHY;  Surgeon: Sherren Mocha, MD;  Location: [facility];  Service: Cardiovascular;  Laterality: N/A;  . ROBOT ASSISTED LAPAROSCOPIC RADICAL PROSTATECTOMY N/A 06/02/2019   Procedure: XI ROBOTIC ASSISTED LAPAROSCOPIC RADICAL PROSTATECTOMY LEVEL 2;  Surgeon: [name], MD;  [facility];  Service: Urology;  Laterality: N/A;  . spinal injection     December 2013  . TEE WITHOUT CARDIOVERSION N/A 08/24/2019   Procedure: TRANSESOPHAGEAL ECHOCARDIOGRAM (TEE);  Surgeon: [name], MD;  Location: [facility];  Service: Cardiology;  Laterality: N/A;  . TEE WITHOUT CARDIOVERSION N/A 09/28/2019   Procedure: TRANSESOPHAGEAL ECHOCARDIOGRAM  (TEE);  Surgeon: Rexene Alberts, MD;  Location: [facility];  Service: Open Heart Surgery;  Laterality: N/A;  . TOTAL HIP ARTHROPLASTY Right 01/29/2017   Procedure: RIGHT TOTAL HIP ARTHROPLASTY ANTERIOR APPROACH;  Surgeon: Rod Can, MD;  [facility];  Service: Orthopedics;  Laterality: Right;  Marland Kitchen VASECTOMY      MEDICATIONS: . apixaban (ELIQUIS) 5 MG TABS tablet  . ascorbic acid (VITAMIN C) 500 MG tablet  . digoxin (LANOXIN) 0.125 MG tablet  . fexofenadine (ALLEGRA) 180 MG tablet  . hydrocortisone cream 1 %  . metoprolol tartrate (LOPRESSOR) 25 MG tablet  . Multiple Vitamins-Minerals (MULTIVITAMIN WITH MINERALS) tablet  . oxyCODONE-acetaminophen (PERCOCET) 10-325 MG tablet  . polyethylene glycol (MIRALAX / GLYCOLAX) 17 g packet  . rosuvastatin (CRESTOR) 5 MG tablet  . sildenafil (VIAGRA) 100 MG tablet  . zolpidem (AMBIEN) 5 MG tablet   No current facility-administered medications for  this encounter.    Konrad Felix, PA-C [facility] [phone]

## 2020-06-20 ENCOUNTER — Ambulatory Visit (HOSPITAL_COMMUNITY): Payer: Medicare Other | Admitting: Physician Assistant

## 2020-06-20 ENCOUNTER — Ambulatory Visit (HOSPITAL_COMMUNITY): Payer: Medicare Other

## 2020-06-20 ENCOUNTER — Encounter (HOSPITAL_COMMUNITY): Payer: Self-pay | Admitting: Orthopedic Surgery

## 2020-06-20 ENCOUNTER — Ambulatory Visit (HOSPITAL_COMMUNITY): Payer: Medicare Other | Admitting: Anesthesiology

## 2020-06-20 ENCOUNTER — Ambulatory Visit (HOSPITAL_COMMUNITY)
Admission: RE | Admit: 2020-06-20 | Discharge: 2020-06-21 | Disposition: A | Discharge status: Home / Self Care | Payer: Medicare Other | Attending: Orthopedic Surgery | Admitting: Orthopedic Surgery

## 2020-06-20 ENCOUNTER — Encounter (HOSPITAL_COMMUNITY)
Admission: RE | Disposition: A | Discharge status: Home / Self Care | Payer: Self-pay | Source: Home / Self Care | Attending: Orthopedic Surgery

## 2020-06-20 DIAGNOSIS — Z9079 Acquired absence of other genital organ(s): Secondary | ICD-10-CM | POA: Insufficient documentation

## 2020-06-20 DIAGNOSIS — Z8249 Family history of ischemic heart disease and other diseases of the circulatory system: Secondary | ICD-10-CM | POA: Diagnosis not present

## 2020-06-20 DIAGNOSIS — S82122A Displaced fracture of lateral condyle of left tibia, initial encounter for closed fracture: Secondary | ICD-10-CM

## 2020-06-20 DIAGNOSIS — M1612 Unilateral primary osteoarthritis, left hip: Secondary | ICD-10-CM | POA: Insufficient documentation

## 2020-06-20 DIAGNOSIS — Z809 Family history of malignant neoplasm, unspecified: Secondary | ICD-10-CM | POA: Insufficient documentation

## 2020-06-20 DIAGNOSIS — Z471 Aftercare following joint replacement surgery: Secondary | ICD-10-CM | POA: Diagnosis not present

## 2020-06-20 DIAGNOSIS — M5416 Radiculopathy, lumbar region: Secondary | ICD-10-CM | POA: Diagnosis not present

## 2020-06-20 DIAGNOSIS — I081 Rheumatic disorders of both mitral and tricuspid valves: Secondary | ICD-10-CM | POA: Diagnosis not present

## 2020-06-20 DIAGNOSIS — N529 Male erectile dysfunction, unspecified: Secondary | ICD-10-CM | POA: Diagnosis not present

## 2020-06-20 DIAGNOSIS — G629 Polyneuropathy, unspecified: Secondary | ICD-10-CM | POA: Diagnosis not present

## 2020-06-20 DIAGNOSIS — R262 Difficulty in walking, not elsewhere classified: Secondary | ICD-10-CM | POA: Diagnosis not present

## 2020-06-20 DIAGNOSIS — C61 Malignant neoplasm of prostate: Secondary | ICD-10-CM | POA: Diagnosis not present

## 2020-06-20 DIAGNOSIS — Z79899 Other long term (current) drug therapy: Secondary | ICD-10-CM | POA: Diagnosis not present

## 2020-06-20 DIAGNOSIS — Z09 Encounter for follow-up examination after completed treatment for conditions other than malignant neoplasm: Secondary | ICD-10-CM

## 2020-06-20 DIAGNOSIS — Z96642 Presence of left artificial hip joint: Secondary | ICD-10-CM | POA: Diagnosis not present

## 2020-06-20 DIAGNOSIS — Z803 Family history of malignant neoplasm of breast: Secondary | ICD-10-CM | POA: Insufficient documentation

## 2020-06-20 DIAGNOSIS — Z7901 Long term (current) use of anticoagulants: Secondary | ICD-10-CM | POA: Diagnosis not present

## 2020-06-20 DIAGNOSIS — M6281 Muscle weakness (generalized): Secondary | ICD-10-CM | POA: Insufficient documentation

## 2020-06-20 DIAGNOSIS — I340 Nonrheumatic mitral (valve) insufficiency: Secondary | ICD-10-CM | POA: Diagnosis not present

## 2020-06-20 DIAGNOSIS — Z96641 Presence of right artificial hip joint: Secondary | ICD-10-CM | POA: Insufficient documentation

## 2020-06-20 HISTORY — PX: TOTAL HIP ARTHROPLASTY: SHX124

## 2020-06-20 LAB — TYPE AND SCREEN
ABO/RH(D): O POS
Antibody Screen: NEGATIVE

## 2020-06-20 SURGERY — ARTHROPLASTY, HIP, TOTAL, ANTERIOR APPROACH
Anesthesia: Spinal | Site: Hip | Laterality: Left

## 2020-06-20 MED ORDER — GLYCOPYRROLATE 0.2 MG/ML IJ SOLN
INTRAMUSCULAR | Status: DC | PRN
  Administered 2020-06-20 (×2): .1 mg via INTRAVENOUS

## 2020-06-20 MED ORDER — MIDAZOLAM HCL 2 MG/2ML IJ SOLN
INTRAMUSCULAR | Status: AC
  Filled 2020-06-20: qty 2

## 2020-06-20 MED ORDER — ONDANSETRON HCL 4 MG PO TABS: 4.0000 mg | ORAL_TABLET | Freq: Four times a day (QID) | ORAL | Status: DC | PRN

## 2020-06-20 MED ORDER — BUPIVACAINE-EPINEPHRINE (PF) 0.25% -1:200000 IJ SOLN
INTRAMUSCULAR | Status: AC
  Filled 2020-06-20: qty 30

## 2020-06-20 MED ORDER — ONDANSETRON HCL 4 MG/2ML IJ SOLN: 4.0000 mg | Freq: Four times a day (QID) | INTRAMUSCULAR | Status: DC | PRN

## 2020-06-20 MED ORDER — POLYETHYLENE GLYCOL 3350 17 G PO PACK
17.0000 g | PACK | Freq: Every day | ORAL | Status: DC | PRN
  Administered 2020-06-20 – 2020-06-21 (×2): 17 g via ORAL
  Filled 2020-06-20 (×2): qty 1

## 2020-06-20 MED ORDER — KETOROLAC TROMETHAMINE 30 MG/ML IJ SOLN
INTRAMUSCULAR | Status: DC | PRN
  Administered 2020-06-20: 30 mg

## 2020-06-20 MED ORDER — DEXAMETHASONE SODIUM PHOSPHATE 10 MG/ML IJ SOLN
10.0000 mg | Freq: Once | INTRAMUSCULAR | Status: AC
  Administered 2020-06-21: 10 mg via INTRAVENOUS
  Filled 2020-06-20: qty 1

## 2020-06-20 MED ORDER — 0.9 % SODIUM CHLORIDE (POUR BTL) OPTIME
TOPICAL | Status: DC | PRN
  Administered 2020-06-20: 1000 mL

## 2020-06-20 MED ORDER — WATER FOR IRRIGATION, STERILE IR SOLN
Status: DC | PRN
  Administered 2020-06-20: 2000 mL

## 2020-06-20 MED ORDER — CHLORHEXIDINE GLUCONATE 0.12 % MT SOLN
15.0000 mL | Freq: Once | OROMUCOSAL | Status: AC
  Administered 2020-06-20: 15 mL via OROMUCOSAL

## 2020-06-20 MED ORDER — FENTANYL CITRATE (PF) 100 MCG/2ML IJ SOLN: 25.0000 ug | INTRAMUSCULAR | Status: DC | PRN

## 2020-06-20 MED ORDER — PROPOFOL 500 MG/50ML IV EMUL
INTRAVENOUS | Status: DC | PRN
  Administered 2020-06-20: 40 ug/kg/min via INTRAVENOUS

## 2020-06-20 MED ORDER — ISOPROPYL ALCOHOL 70 % SOLN
Status: DC | PRN
  Administered 2020-06-20: 1 via TOPICAL

## 2020-06-20 MED ORDER — OXYCODONE HCL 5 MG PO TABS: 5.0000 mg | ORAL_TABLET | Freq: Once | ORAL | Status: DC | PRN

## 2020-06-20 MED ORDER — METHOCARBAMOL 500 MG IVPB - SIMPLE MED
500.0000 mg | Freq: Four times a day (QID) | INTRAVENOUS | Status: DC | PRN
  Filled 2020-06-20: qty 50

## 2020-06-20 MED ORDER — PHENYLEPHRINE HCL-NACL 20-0.9 MG/250ML-% IV SOLN
INTRAVENOUS | Status: DC | PRN
  Administered 2020-06-20: 20 ug/min via INTRAVENOUS

## 2020-06-20 MED ORDER — OXYCODONE HCL 5 MG/5ML PO SOLN: 5.0000 mg | Freq: Once | ORAL | Status: DC | PRN

## 2020-06-20 MED ORDER — ONDANSETRON HCL 4 MG/2ML IJ SOLN
INTRAMUSCULAR | Status: AC
  Filled 2020-06-20: qty 2

## 2020-06-20 MED ORDER — PROMETHAZINE HCL 25 MG/ML IJ SOLN: 6.2500 mg | INTRAMUSCULAR | Status: DC | PRN

## 2020-06-20 MED ORDER — BUPIVACAINE-EPINEPHRINE 0.25% -1:200000 IJ SOLN
INTRAMUSCULAR | Status: DC | PRN
  Administered 2020-06-20: 30 mL

## 2020-06-20 MED ORDER — MENTHOL 3 MG MT LOZG: 1.0000 | LOZENGE | OROMUCOSAL | Status: DC | PRN

## 2020-06-20 MED ORDER — DIGOXIN 125 MCG PO TABS
0.1250 mg | ORAL_TABLET | Freq: Every day | ORAL | Status: DC
  Administered 2020-06-21: 0.125 mg via ORAL
  Filled 2020-06-20: qty 1

## 2020-06-20 MED ORDER — ONDANSETRON HCL 4 MG/2ML IJ SOLN
INTRAMUSCULAR | Status: DC | PRN
  Administered 2020-06-20: 4 mg via INTRAVENOUS

## 2020-06-20 MED ORDER — METHOCARBAMOL 500 MG PO TABS
500.0000 mg | ORAL_TABLET | Freq: Four times a day (QID) | ORAL | Status: DC | PRN
  Administered 2020-06-20 – 2020-06-21 (×2): 500 mg via ORAL
  Filled 2020-06-20 (×2): qty 1

## 2020-06-20 MED ORDER — METOCLOPRAMIDE HCL 5 MG PO TABS: 5.0000 mg | ORAL_TABLET | Freq: Three times a day (TID) | ORAL | Status: DC | PRN

## 2020-06-20 MED ORDER — METOPROLOL TARTRATE 25 MG PO TABS
25.0000 mg | ORAL_TABLET | Freq: Two times a day (BID) | ORAL | Status: DC
  Administered 2020-06-20 – 2020-06-21 (×2): 25 mg via ORAL
  Filled 2020-06-20 (×2): qty 1

## 2020-06-20 MED ORDER — PHENYLEPHRINE HCL (PRESSORS) 10 MG/ML IV SOLN
INTRAVENOUS | Status: AC
  Filled 2020-06-20: qty 2

## 2020-06-20 MED ORDER — PHENOL 1.4 % MT LIQD: 1.0000 | OROMUCOSAL | Status: DC | PRN

## 2020-06-20 MED ORDER — GLYCOPYRROLATE PF 0.2 MG/ML IJ SOSY
PREFILLED_SYRINGE | INTRAMUSCULAR | Status: AC
  Filled 2020-06-20: qty 1

## 2020-06-20 MED ORDER — OXYCODONE HCL 5 MG PO TABS
10.0000 mg | ORAL_TABLET | ORAL | Status: DC | PRN
  Administered 2020-06-20: 10 mg via ORAL

## 2020-06-20 MED ORDER — CEFAZOLIN SODIUM-DEXTROSE 2-4 GM/100ML-% IV SOLN
2.0000 g | Freq: Four times a day (QID) | INTRAVENOUS | Status: AC
  Administered 2020-06-20 (×2): 2 g via INTRAVENOUS
  Filled 2020-06-20 (×2): qty 100

## 2020-06-20 MED ORDER — SODIUM CHLORIDE (PF) 0.9 % IJ SOLN
INTRAMUSCULAR | Status: AC
  Filled 2020-06-20: qty 30

## 2020-06-20 MED ORDER — DOCUSATE SODIUM 100 MG PO CAPS
100.0000 mg | ORAL_CAPSULE | Freq: Two times a day (BID) | ORAL | Status: DC
  Administered 2020-06-20 – 2020-06-21 (×2): 100 mg via ORAL
  Filled 2020-06-20 (×2): qty 1

## 2020-06-20 MED ORDER — ORAL CARE MOUTH RINSE: 15.0000 mL | Freq: Once | OROMUCOSAL | Status: AC

## 2020-06-20 MED ORDER — PHENYLEPHRINE HCL-NACL 10-0.9 MG/250ML-% IV SOLN
INTRAVENOUS | Status: AC
  Filled 2020-06-20: qty 250

## 2020-06-20 MED ORDER — DEXAMETHASONE SODIUM PHOSPHATE 10 MG/ML IJ SOLN
INTRAMUSCULAR | Status: AC
  Filled 2020-06-20: qty 1

## 2020-06-20 MED ORDER — CEFAZOLIN SODIUM-DEXTROSE 2-4 GM/100ML-% IV SOLN
2.0000 g | INTRAVENOUS | Status: AC
  Administered 2020-06-20: 2 g via INTRAVENOUS
  Filled 2020-06-20: qty 100

## 2020-06-20 MED ORDER — FENTANYL CITRATE (PF) 100 MCG/2ML IJ SOLN
INTRAMUSCULAR | Status: DC | PRN
  Administered 2020-06-20: 50 ug via INTRAVENOUS

## 2020-06-20 MED ORDER — APIXABAN 2.5 MG PO TABS
2.5000 mg | ORAL_TABLET | Freq: Two times a day (BID) | ORAL | Status: DC
  Administered 2020-06-21: 2.5 mg via ORAL
  Filled 2020-06-20: qty 1

## 2020-06-20 MED ORDER — POVIDONE-IODINE 10 % EX SWAB: 2.0000 "application " | Freq: Once | CUTANEOUS | Status: DC

## 2020-06-20 MED ORDER — SODIUM CHLORIDE 0.9 % IV SOLN: INTRAVENOUS | Status: DC

## 2020-06-20 MED ORDER — ACETAMINOPHEN 10 MG/ML IV SOLN
1000.0000 mg | Freq: Once | INTRAVENOUS | Status: AC
  Administered 2020-06-20: 1000 mg via INTRAVENOUS
  Filled 2020-06-20: qty 100

## 2020-06-20 MED ORDER — MIDAZOLAM HCL 5 MG/5ML IJ SOLN
INTRAMUSCULAR | Status: DC | PRN
  Administered 2020-06-20: 2 mg via INTRAVENOUS

## 2020-06-20 MED ORDER — ASCORBIC ACID 500 MG PO TABS
500.0000 mg | ORAL_TABLET | Freq: Every day | ORAL | Status: DC
  Administered 2020-06-21: 500 mg via ORAL
  Filled 2020-06-20: qty 1

## 2020-06-20 MED ORDER — ALUM & MAG HYDROXIDE-SIMETH 200-200-20 MG/5ML PO SUSP: 30.0000 mL | ORAL | Status: DC | PRN

## 2020-06-20 MED ORDER — HYDROMORPHONE HCL 1 MG/ML IJ SOLN: 0.5000 mg | INTRAMUSCULAR | Status: DC | PRN

## 2020-06-20 MED ORDER — METOCLOPRAMIDE HCL 5 MG/ML IJ SOLN: 5.0000 mg | Freq: Three times a day (TID) | INTRAMUSCULAR | Status: DC | PRN

## 2020-06-20 MED ORDER — DIPHENHYDRAMINE HCL 12.5 MG/5ML PO ELIX: 12.5000 mg | ORAL_SOLUTION | ORAL | Status: DC | PRN

## 2020-06-20 MED ORDER — ROSUVASTATIN CALCIUM 5 MG PO TABS
5.0000 mg | ORAL_TABLET | Freq: Every day | ORAL | Status: DC
  Administered 2020-06-20 – 2020-06-21 (×2): 5 mg via ORAL
  Filled 2020-06-20 (×2): qty 1

## 2020-06-20 MED ORDER — KETOROLAC TROMETHAMINE 30 MG/ML IJ SOLN
INTRAMUSCULAR | Status: AC
  Filled 2020-06-20: qty 1

## 2020-06-20 MED ORDER — FENTANYL CITRATE (PF) 100 MCG/2ML IJ SOLN
INTRAMUSCULAR | Status: AC
  Filled 2020-06-20: qty 2

## 2020-06-20 MED ORDER — POVIDONE-IODINE 10 % EX SWAB
2.0000 "application " | Freq: Once | CUTANEOUS | Status: AC
  Administered 2020-06-20: 2 via TOPICAL

## 2020-06-20 MED ORDER — SENNA 8.6 MG PO TABS
1.0000 | ORAL_TABLET | Freq: Two times a day (BID) | ORAL | Status: DC
  Administered 2020-06-20: 8.6 mg via ORAL
  Filled 2020-06-20 (×2): qty 1

## 2020-06-20 MED ORDER — SODIUM CHLORIDE 0.9 % IR SOLN
Status: DC | PRN
  Administered 2020-06-20: 1000 mL

## 2020-06-20 MED ORDER — TRANEXAMIC ACID-NACL 1000-0.7 MG/100ML-% IV SOLN
1000.0000 mg | Freq: Once | INTRAVENOUS | Status: AC
  Administered 2020-06-20: 1000 mg via INTRAVENOUS
  Filled 2020-06-20: qty 100

## 2020-06-20 MED ORDER — OXYCODONE HCL 5 MG PO TABS
5.0000 mg | ORAL_TABLET | ORAL | Status: DC | PRN
  Administered 2020-06-20 – 2020-06-21 (×4): 10 mg via ORAL
  Filled 2020-06-20 (×5): qty 2

## 2020-06-20 MED ORDER — LACTATED RINGERS IV SOLN: INTRAVENOUS | Status: DC

## 2020-06-20 MED ORDER — LORATADINE 10 MG PO TABS: 10.0000 mg | ORAL_TABLET | Freq: Every day | ORAL | Status: DC | PRN

## 2020-06-20 MED ORDER — POLYETHYLENE GLYCOL 3350 17 G PO PACK: 17.0000 g | PACK | Freq: Every day | ORAL | Status: DC

## 2020-06-20 MED ORDER — TRANEXAMIC ACID-NACL 1000-0.7 MG/100ML-% IV SOLN
1000.0000 mg | INTRAVENOUS | Status: AC
  Administered 2020-06-20: 1000 mg via INTRAVENOUS
  Filled 2020-06-20: qty 100

## 2020-06-20 MED ORDER — ZOLPIDEM TARTRATE 5 MG PO TABS
5.0000 mg | ORAL_TABLET | Freq: Every day | ORAL | Status: DC
  Administered 2020-06-20: 5 mg via ORAL
  Filled 2020-06-20: qty 1

## 2020-06-20 MED ORDER — DEXAMETHASONE SODIUM PHOSPHATE 10 MG/ML IJ SOLN
INTRAMUSCULAR | Status: DC | PRN
  Administered 2020-06-20: 10 mg via INTRAVENOUS

## 2020-06-20 MED ORDER — ACETAMINOPHEN 325 MG PO TABS: 325.0000 mg | ORAL_TABLET | Freq: Four times a day (QID) | ORAL | Status: DC | PRN

## 2020-06-20 MED ORDER — SODIUM CHLORIDE (PF) 0.9 % IJ SOLN
INTRAMUSCULAR | Status: DC | PRN
  Administered 2020-06-20: 30 mL

## 2020-06-20 SURGICAL SUPPLY — 61 items
BAG DECANTER FOR FLEXI CONT (MISCELLANEOUS) IMPLANT
BAG ZIPLOCK 12X15 (MISCELLANEOUS) IMPLANT
BLADE SURG SZ10 CARB STEEL (BLADE) IMPLANT
CHLORAPREP W/TINT 26 (MISCELLANEOUS) ×2 IMPLANT
COVER PERINEAL POST (MISCELLANEOUS) ×2 IMPLANT
COVER SURGICAL LIGHT HANDLE (MISCELLANEOUS) ×2 IMPLANT
COVER WAND RF STERILE (DRAPES) IMPLANT
CUP ACET PNNCL SECTR W/GRIP 56 (Hips) ×1 IMPLANT
DECANTER SPIKE VIAL GLASS SM (MISCELLANEOUS) ×2 IMPLANT
DERMABOND ADVANCED (GAUZE/BANDAGES/DRESSINGS) ×1
DERMABOND ADVANCED .7 DNX12 (GAUZE/BANDAGES/DRESSINGS) ×1 IMPLANT
DRAPE IMP U-DRAPE 54X76 (DRAPES) ×2 IMPLANT
DRAPE SHEET LG 3/4 BI-LAMINATE (DRAPES) ×6 IMPLANT
DRAPE STERI IOBAN 125X83 (DRAPES) IMPLANT
DRAPE U-SHAPE 47X51 STRL (DRAPES) ×4 IMPLANT
DRESSING AQUACEL AG SP 3.5X10 (GAUZE/BANDAGES/DRESSINGS) ×1 IMPLANT
DRSG AQUACEL AG ADV 3.5X10 (GAUZE/BANDAGES/DRESSINGS) ×2 IMPLANT
DRSG AQUACEL AG SP 3.5X10 (GAUZE/BANDAGES/DRESSINGS) ×2
ELECT REM PT RETURN 15FT ADLT (MISCELLANEOUS) ×2 IMPLANT
GAUZE SPONGE 4X4 12PLY STRL (GAUZE/BANDAGES/DRESSINGS) ×2 IMPLANT
GLOVE SRG 8 PF TXTR STRL LF DI (GLOVE) ×1 IMPLANT
GLOVE SURG ENC MOIS LTX SZ8.5 (GLOVE) ×4 IMPLANT
GLOVE SURG ENC TEXT LTX SZ7.5 (GLOVE) ×4 IMPLANT
GLOVE SURG UNDER POLY LF SZ8 (GLOVE) ×1
GLOVE SURG UNDER POLY LF SZ8.5 (GLOVE) ×2 IMPLANT
GOWN SPEC L3 XXLG W/TWL (GOWN DISPOSABLE) ×2 IMPLANT
GOWN STRL REUS W/TWL XL LVL3 (GOWN DISPOSABLE) ×2 IMPLANT
HANDPIECE INTERPULSE COAX TIP (DISPOSABLE) ×1
HEAD CERAMIC DELTA 36 PLUS 1.5 (Hips) ×2 IMPLANT
HOLDER FOLEY CATH W/STRAP (MISCELLANEOUS) ×2 IMPLANT
HOOD PEEL AWAY FLYTE STAYCOOL (MISCELLANEOUS) ×8 IMPLANT
JET LAVAGE IRRISEPT WOUND (IRRIGATION / IRRIGATOR)
KIT TURNOVER KIT A (KITS) ×2 IMPLANT
LAVAGE JET IRRISEPT WOUND (IRRIGATION / IRRIGATOR) IMPLANT
LINER NEUTRAL 52MMX36MMX56N (Liner) ×2 IMPLANT
MANIFOLD NEPTUNE II (INSTRUMENTS) ×2 IMPLANT
MARKER SKIN DUAL TIP RULER LAB (MISCELLANEOUS) ×2 IMPLANT
NDL SAFETY ECLIPSE 18X1.5 (NEEDLE) ×1 IMPLANT
NEEDLE HYPO 18GX1.5 SHARP (NEEDLE) ×1
NEEDLE SPNL 18GX3.5 QUINCKE PK (NEEDLE) ×2 IMPLANT
PACK ANTERIOR HIP CUSTOM (KITS) ×2 IMPLANT
PENCIL SMOKE EVACUATOR (MISCELLANEOUS) IMPLANT
PINN SECTOR W/GRIP ACE CUP 56 (Hips) ×2 IMPLANT
SAW OSC TIP CART 19.5X105X1.3 (SAW) ×2 IMPLANT
SEALER BIPOLAR AQUA 6.0 (INSTRUMENTS) ×2 IMPLANT
SET HNDPC FAN SPRY TIP SCT (DISPOSABLE) ×1 IMPLANT
STEM TRI LOC BPS SZ8 W GRIPTON ×1 IMPLANT
SUT ETHIBOND NAB CT1 #1 30IN (SUTURE) ×4 IMPLANT
SUT MNCRL AB 3-0 PS2 18 (SUTURE) ×2 IMPLANT
SUT MNCRL AB 4-0 PS2 18 (SUTURE) ×2 IMPLANT
SUT MON AB 2-0 CT1 36 (SUTURE) ×4 IMPLANT
SUT STRATAFIX PDO 1 14 VIOLET (SUTURE) ×1
SUT STRATFX PDO 1 14 VIOLET (SUTURE) ×1
SUT VIC AB 2-0 CT1 27 (SUTURE) ×1
SUT VIC AB 2-0 CT1 TAPERPNT 27 (SUTURE) ×1 IMPLANT
SUTURE STRATFX PDO 1 14 VIOLET (SUTURE) ×1 IMPLANT
SYR 3ML LL SCALE MARK (SYRINGE) ×2 IMPLANT
TRAY FOLEY MTR SLVR 16FR STAT (SET/KITS/TRAYS/PACK) IMPLANT
TRI LOC BPS SZ8 W GRIPTON ×2 IMPLANT
TUBE SUCTION HIGH CAP CLEAR NV (SUCTIONS) ×2 IMPLANT
WATER STERILE IRR 1000ML POUR (IV SOLUTION) ×2 IMPLANT

## 2020-06-20 NOTE — Discharge Instructions (Signed)
°Dr. Jakobie Henslee °Joint Replacement Specialist °[facility] °[address] °[place], [place] [address] °[phone] ° ° °TOTAL HIP REPLACEMENT POSTOPERATIVE DIRECTIONS ° ° ° °Hip Rehabilitation, Guidelines Following Surgery  ° °WEIGHT BEARING °Weight bearing as tolerated with assist device (walker, cane, etc) as directed, use it as long as suggested by your surgeon or therapist, typically at least 4-6 weeks. ° °The results of a hip operation are greatly improved after range of motion and muscle strengthening exercises. Follow all safety measures which are given to protect your hip. If any of these exercises cause increased pain or swelling in your joint, decrease the amount until you are comfortable again. Then slowly increase the exercises. Call your caregiver if you have problems or questions.  ° °HOME CARE INSTRUCTIONS  °Most of the following instructions are designed to prevent the dislocation of your new hip.  °Remove items at home which could result in a fall. This includes throw rugs or furniture in walking pathways.  °Continue medications as instructed at time of discharge. °· You may have some home medications which will be placed on hold until you complete the course of blood thinner medication. °· You may start showering once you are discharged home. Do not remove your dressing. °Do not put on socks or shoes without following the instructions of your caregivers.   °Sit on chairs with arms. Use the chair arms to help push yourself up when arising.  °Arrange for the use of a toilet seat elevator so you are not sitting low.  °· Walk with walker as instructed.  °You may resume a sexual relationship in one month or when given the OK by your caregiver.  °Use walker as long as suggested by your caregivers.  °You may put full weight on your legs and walk as much as is comfortable. °Avoid periods of inactivity such as sitting longer than an hour when not asleep. This helps prevent  blood clots.  °You may return to work once you are cleared by your surgeon.  °Do not drive a car for 6 weeks or until released by your surgeon.  °Do not drive while taking narcotics.  °Wear elastic stockings for two weeks following surgery during the day but you may remove then at night.  °Make sure you keep all of your appointments after your operation with all of your doctors and caregivers. You should call the office at the above phone number and make an appointment for approximately two weeks after the date of your surgery. °Please pick up a stool softener and laxative for home use as long as you are requiring pain medications. °· ICE to the affected hip every three hours for 30 minutes at a time and then as needed for pain and swelling. Continue to use ice on the hip for pain and swelling from surgery. You may notice swelling that will progress down to the foot and ankle.  This is normal after surgery.  Elevate the leg when you are not up walking on it.   °It is important for you to complete the blood thinner medication as prescribed by your doctor. °· Continue to use the breathing machine which will help keep your temperature down.  It is common for your temperature to cycle up and down following surgery, especially at night when you are not up moving around and exerting yourself.  The breathing machine keeps your lungs expanded and your temperature down. ° °RANGE OF MOTION AND STRENGTHENING EXERCISES  °These exercises are   designed to help you keep full movement of your hip joint. Follow your caregiver's or physical therapist's instructions. Perform all exercises about fifteen times, three times per day or as directed. Exercise both hips, even if you have had only one joint replacement. These exercises can be done on a training (exercise) mat, on the floor, on a table or on a bed. Use whatever works the best and is most comfortable for you. Use music or television while you are exercising so that the exercises  are a pleasant break in your day. This will make your life better with the exercises acting as a break in routine you can look forward to.  °Lying on your back, slowly slide your foot toward your buttocks, raising your knee up off the floor. Then slowly slide your foot back down until your leg is straight again.  °Lying on your back spread your legs as far apart as you can without causing discomfort.  °Lying on your side, raise your upper leg and foot straight up from the floor as far as is comfortable. Slowly lower the leg and repeat.  °Lying on your back, tighten up the muscle in the front of your thigh (quadriceps muscles). You can do this by keeping your leg straight and trying to raise your heel off the floor. This helps strengthen the largest muscle supporting your knee.  °Lying on your back, tighten up the muscles of your buttocks both with the legs straight and with the knee bent at a comfortable angle while keeping your heel on the floor.  ° °SKILLED REHAB INSTRUCTIONS: °If the patient is transferred to a skilled rehab facility following release from the hospital, a list of the current medications will be sent to the facility for the patient to continue.  When discharged from the skilled rehab facility, please have the facility set up the patient's Home Health Physical Therapy prior to being released. Also, the skilled facility will be responsible for providing the patient with their medications at time of release from the facility to include their pain medication and their blood thinner medication. If the patient is still at the rehab facility at time of the two week follow up appointment, the skilled rehab facility will also need to assist the patient in arranging follow up appointment in our office and any transportation needs. ° °MAKE SURE YOU:  °Understand these instructions.  °Will watch your condition.  °Will get help right away if you are not doing well or get worse. ° °Pick up stool softner and  laxative for home use following surgery while on pain medications. °Do not remove your dressing. °The dressing is waterproof--it is OK to take showers. °Continue to use ice for pain and swelling after surgery. °Do not use any lotions or creams on the incision until instructed by your surgeon. °Total Hip Protocol. ° ° °

## 2020-06-20 NOTE — Transfer of Care (Signed)
Immediate Anesthesia Transfer of Care Note  Patient: Raymond Ray  Procedure(s) Performed: TOTAL HIP ARTHROPLASTY ANTERIOR APPROACH (Left Hip)  Patient Location: PACU  Anesthesia Type:MAC and Spinal  Level of Consciousness: awake, alert , oriented and patient cooperative  Airway & Oxygen Therapy: Patient Spontanous Breathing and Patient connected to face mask oxygen  Post-op Assessment: Report given to RN, Post -op Vital signs reviewed and stable and Patient moving all extremities  Post vital signs: Reviewed and stable  Last Vitals:  Vitals Value Taken Time  BP 79/56 06/20/20 1012  Temp    Pulse 73 06/20/20 1013  Resp 23 06/20/20 1013  SpO2 100 % 06/20/20 1013  Vitals shown include unvalidated device data.  Last Pain:  Vitals:   06/20/20 0609  TempSrc:   PainSc: 0-No pain         Complications: No complications documented.

## 2020-06-20 NOTE — Plan of Care (Signed)
  Problem: Education: Goal: Knowledge of General Education information will improve Description: Including pain rating scale, medication(s)/side effects and non-pharmacologic comfort measures Outcome: Progressing   Problem: Activity: Goal: Risk for activity intolerance will decrease Outcome: Progressing   Problem: Pain Managment: Goal: General experience of comfort will improve Outcome: Progressing   

## 2020-06-20 NOTE — Evaluation (Signed)
Physical Therapy Evaluation Patient Details Name: Raymond Ray MRN: [id number] DOB: 03/22/1950 Today's Date: 06/20/2020   History of Present Illness  Patient is 70 y.o. male s/p Lt THA anterior approach on 06/20/20 with PMH significant for vertigo, prostate cancer, OA, HTN, Afib, CAD, anemia, Rt THA in 2018.    Clinical Impression  Raymond Ray is a 70 y.o. male POD 0 s/p Lt THA. Patient reports independence with mobility at baseline. Patient is now limited by functional impairments (see PT problem list below) and requires min assist/guard for transfers and gait with RW. Patient was able to ambulate ~100 feet with RW and min assist. Patient instructed in exercise to facilitate circulation to reduce risk of DVT. Patient will benefit from continued skilled PT interventions to address impairments and progress towards PLOF. Acute PT will follow to progress mobility and stair training in preparation for safe discharge home.     Follow Up Recommendations Follow surgeon's recommendation for DC plan and follow-up therapies;Outpatient PT    Equipment Recommendations  None recommended by PT    Recommendations for Other Services       Precautions / Restrictions Precautions Precautions: Fall Restrictions Weight Bearing Restrictions: No Other Position/Activity Restrictions: WBAT      Mobility  Bed Mobility Overal bed mobility: Needs Assistance Bed Mobility: Supine to Sit     Supine to sit: Min guard;Supervision;HOB elevated     General bed mobility comments: cues to use bed rail if needed, pt taking extra time and able to raise trunk and pivot LE's off EOB.    Transfers Overall transfer level: Needs assistance Equipment used: Rolling walker (2 wheeled) Transfers: Sit to/from Stand Sit to Stand: Min assist;From elevated surface         General transfer comment: Min cues for safe hand placement for power up, pt initaited rise and light assist to steady  provided.  Ambulation/Gait Ambulation/Gait assistance: Min assist Gait Distance (Feet): 100 Feet Assistive device: Rolling walker (2 wheeled) Gait Pattern/deviations: Step-to pattern;Step-through pattern;Decreased stride length;Decreased weight shift to left Gait velocity: decr   General Gait Details: VC's for step to pattern and pt progressed to step through with no overt LOB. pt with silghtly narrow BOS at start and corrected throughout.  Stairs            Wheelchair Mobility    Modified Rankin (Stroke Patients Only)       Balance Overall balance assessment: Needs assistance Sitting-balance support: Feet supported Sitting balance-Leahy Scale: Good     Standing balance support: During functional activity;Bilateral upper extremity supported Standing balance-Leahy Scale: Fair                               Pertinent Vitals/Pain Pain Assessment: 0-10 Pain Score: 2  Pain Location: Lt hip Pain Descriptors / Indicators: Aching;Discomfort Pain Intervention(s): Limited activity within patient's tolerance;Monitored during session;Repositioned;Premedicated before session;Ice applied    Home Living Family/patient expects to be discharged to:: Private residence Living Arrangements: Spouse/significant other Available Help at Discharge: Family Type of Home: House Home Access: Stairs to enter Entrance Stairs-Rails: None Entrance Stairs-Number of Steps: 3+1 Home Layout: Two level;Bed/bath upstairs Home Equipment: Environmental consultant - 2 wheels;Bedside commode;Shower seat;Grab bars - tub/shower      Prior Function Level of Independence: Independent               Hand Dominance   Dominant Hand: Right    Extremity/Trunk Assessment   Upper Extremity  Assessment Upper Extremity Assessment: Overall WFL for tasks assessed    Lower Extremity Assessment Lower Extremity Assessment: Overall WFL for tasks assessed    Cervical / Trunk Assessment Cervical / Trunk  Assessment: Normal  Communication   Communication: No difficulties  Cognition Arousal/Alertness: Awake/alert Behavior During Therapy: WFL for tasks assessed/performed Overall Cognitive Status: Within Functional Limits for tasks assessed                                        General Comments      Exercises Total Joint Exercises Ankle Circles/Pumps: AROM;Both;20 reps;Seated   Assessment/Plan    PT Assessment Patient needs continued PT services  PT Problem List Decreased strength;Decreased range of motion;Decreased activity tolerance;Decreased balance;Decreased mobility;Decreased knowledge of use of DME;Decreased safety awareness;Decreased knowledge of precautions       PT Treatment Interventions DME instruction;Gait training;Stair training;Functional mobility training;Therapeutic activities;Therapeutic exercise;Balance training;Patient/family education    PT Goals (Current goals can be found in the Care Plan section)  Acute Rehab PT Goals Patient Stated Goal: get back to traveling PT Goal Formulation: With patient Time For Goal Achievement: 06/27/20 Potential to Achieve Goals: Good    Frequency 7X/week   Barriers to discharge        Co-evaluation               AM-PAC PT "6 Clicks" Mobility  Outcome Measure Help needed turning from your back to your side while in a flat bed without using bedrails?: A Little Help needed moving from lying on your back to sitting on the side of a flat bed without using bedrails?: A Little Help needed moving to and from a bed to a chair (including a wheelchair)?: A Little Help needed standing up from a chair using your arms (e.g., wheelchair or bedside chair)?: A Little Help needed to walk in hospital room?: A Little Help needed climbing 3-5 steps with a railing? : A Little 6 Click Score: 18    End of Session Equipment Utilized During Treatment: Gait belt Activity Tolerance: Patient tolerated treatment well Patient  left: in chair;with call bell/phone within reach;with chair alarm set Nurse Communication: Mobility status PT Visit Diagnosis: Muscle weakness (generalized) (M62.81);Difficulty in walking, not elsewhere classified (R26.2)    Time: 5993-5701 PT Time Calculation (min) (ACUTE ONLY): 25 min   Charges:   PT Evaluation $PT Eval Low Complexity: 1 Low PT Treatments $Gait Training: 8-22 mins        Verner Mould, DPT Acute Rehabilitation Services Office [phone] Pager [phone]    Jacques Navy 06/20/2020, 4:19 PM

## 2020-06-20 NOTE — Interval H&P Note (Signed)
History and Physical Interval Note:  06/20/2020 7:44 AM  Raymond Ray  has presented today for surgery, with the diagnosis of left hip degenerative joint disease.  The various methods of treatment have been discussed with the patient and family. After consideration of risks, benefits and other options for treatment, the patient has consented to  Procedure(s): TOTAL HIP ARTHROPLASTY ANTERIOR APPROACH (Left) as a surgical intervention.  The patient's history has been reviewed, patient examined, no change in status, stable for surgery.  I have reviewed the patient's chart and labs.  Questions were answered to the patient's satisfaction.     Hilton Cork Marlaysia Lenig

## 2020-06-20 NOTE — Op Note (Signed)
OPERATIVE REPORT  SURGEON: Rod Can, MD   ASSISTANT: Cherlynn June, PA-C.  PREOPERATIVE DIAGNOSIS: Left hip arthritis.   POSTOPERATIVE DIAGNOSIS: Left hip arthritis.   PROCEDURE: Left total hip arthroplasty, anterior approach.   IMPLANTS: DePuy Tri Lock stem, size 8, hi offset. DePuy Pinnacle Cup, size 56 mm. DePuy Altrx liner, size 36 by 56 mm, neutral. DePuy Biolox ceramic head ball, size 36 + 1.5 mm.  ANESTHESIA:  Spinal  ESTIMATED BLOOD LOSS:-375 mL    ANTIBIOTICS: 2g Ancef.  DRAINS: None.  COMPLICATIONS: None.   CONDITION: PACU - hemodynamically stable.   BRIEF CLINICAL NOTE: Raymond Ray is a 70 y.o. male with a long-standing history of Left hip arthritis. After failing conservative management, the patient was indicated for total hip arthroplasty. The risks, benefits, and alternatives to the procedure were explained, and the patient elected to proceed.  PROCEDURE IN DETAIL: Surgical site was marked by myself in the pre-op holding area. Once inside the operating room, spinal anesthesia was obtained, and a foley catheter was inserted. The patient was then positioned on the Hana table.  All bony prominences were well padded.  The hip was prepped and draped in the normal sterile surgical fashion.  A time-out was called verifying side and site of surgery. The patient received IV antibiotics within 60 minutes of beginning the procedure.   The direct anterior approach to the hip was performed through the Hueter interval.  Lateral femoral circumflex vessels were treated with the Auqumantys. The anterior capsule was exposed and an inverted T capsulotomy was made. The femoral neck cut was made to the level of the templated cut.  A corkscrew was placed into the head and the head was removed.  The femoral head was found to have eburnated bone. The head was passed to the back table and was measured.   Acetabular exposure was achieved, and the pulvinar and labrum were  excised. Sequential reaming of the acetabulum was then performed up to a size 55 mm reamer. A 56 mm cup was then opened and impacted into place at approximately 40 degrees of abduction and 20 degrees of anteversion. The final polyethylene liner was impacted into place and acetabular osteophytes were removed.    I then gained femoral exposure taking care to protect the abductors and greater trochanter.  This was performed using standard external rotation, extension, and adduction.  The capsule was peeled off the inner aspect of the greater trochanter, taking care to preserve the short external rotators. A cookie cutter was used to enter the femoral canal, and then the femoral canal finder was placed.  Sequential broaching was performed up to a size 8.  Calcar planer was used on the femoral neck remnant.  I placed a hi offset neck and a trial head ball.  The hip was reduced.  Leg lengths and offset were checked fluoroscopically.  The hip was dislocated and trial components were removed.  The final implants were placed, and the hip was reduced.  Fluoroscopy was used to confirm component position and leg lengths.  At 90 degrees of external rotation and full extension, the hip was stable to an anterior directed force.   The wound was copiously irrigated with Irrisept solution and normal saline using pule lavage.  Marcaine solution was injected into the periarticular soft tissue.  The wound was closed in layers using #1 Stratafix for the fascia, 2-0 Vicryl for the subcutaneous fat, 2-0 Monocryl for the deep dermal layer, 3-0 running Monocryl subcuticular stitch, and Dermabond  for the skin.  Once the glue was fully dried, an Aquacell Ag dressing was applied.  The patient was transported to the recovery room in stable condition.  Sponge, needle, and instrument counts were correct at the end of the case x2.  The patient tolerated the procedure well and there were no known complications.  Please note that a surgical  assistant was a medical necessity for this procedure to perform it in a safe and expeditious manner. Assistant was necessary to provide appropriate retraction of vital neurovascular structures, to prevent femoral fracture, and to allow for anatomic placement of the prosthesis.

## 2020-06-21 ENCOUNTER — Encounter (HOSPITAL_COMMUNITY): Payer: Self-pay | Admitting: Orthopedic Surgery

## 2020-06-21 DIAGNOSIS — M6281 Muscle weakness (generalized): Secondary | ICD-10-CM | POA: Diagnosis not present

## 2020-06-21 DIAGNOSIS — G629 Polyneuropathy, unspecified: Secondary | ICD-10-CM | POA: Diagnosis not present

## 2020-06-21 DIAGNOSIS — I081 Rheumatic disorders of both mitral and tricuspid valves: Secondary | ICD-10-CM | POA: Diagnosis not present

## 2020-06-21 DIAGNOSIS — M1612 Unilateral primary osteoarthritis, left hip: Secondary | ICD-10-CM | POA: Diagnosis not present

## 2020-06-21 DIAGNOSIS — C61 Malignant neoplasm of prostate: Secondary | ICD-10-CM | POA: Diagnosis not present

## 2020-06-21 DIAGNOSIS — R262 Difficulty in walking, not elsewhere classified: Secondary | ICD-10-CM | POA: Diagnosis not present

## 2020-06-21 LAB — CBC
HCT: 25.6 % — ABNORMAL LOW (ref 39.0–52.0)
Hemoglobin: 8.5 g/dL — ABNORMAL LOW (ref 13.0–17.0)
MCH: 35.6 pg — ABNORMAL HIGH (ref 26.0–34.0)
MCHC: 33.2 g/dL (ref 30.0–36.0)
MCV: 107.1 fL — ABNORMAL HIGH (ref 80.0–100.0)
Platelets: 264 10*3/uL (ref 150–400)
RBC: 2.39 MIL/uL — ABNORMAL LOW (ref 4.22–5.81)
RDW: 14.6 % (ref 11.5–15.5)
WBC: 10.8 10*3/uL — ABNORMAL HIGH (ref 4.0–10.5)
nRBC: 0.3 % — ABNORMAL HIGH (ref 0.0–0.2)

## 2020-06-21 LAB — BASIC METABOLIC PANEL
Anion gap: 7 (ref 5–15)
BUN: 26 mg/dL — ABNORMAL HIGH (ref 8–23)
CO2: 21 mmol/L — ABNORMAL LOW (ref 22–32)
Calcium: 8.2 mg/dL — ABNORMAL LOW (ref 8.9–10.3)
Chloride: 110 mmol/L (ref 98–111)
Creatinine, Ser: 0.99 mg/dL (ref 0.61–1.24)
GFR, Estimated: >60 mL/min (ref >60)
Glucose, Bld: 207 mg/dL — ABNORMAL HIGH (ref 70–99)
Potassium: 4.4 mmol/L (ref 3.5–5.1)
Sodium: 138 mmol/L (ref 135–145)

## 2020-06-21 MED ORDER — BUPIVACAINE IN DEXTROSE 0.75-8.25 % IT SOLN
INTRATHECAL | Status: DC | PRN
  Administered 2020-06-20: 1.8 mL via INTRATHECAL

## 2020-06-21 MED ORDER — SENNA 8.6 MG PO TABS: 1.0000 | ORAL_TABLET | Freq: Two times a day (BID) | ORAL | 0 refills | Status: DC

## 2020-06-21 MED ORDER — ONDANSETRON HCL 4 MG PO TABS: 4.0000 mg | ORAL_TABLET | Freq: Four times a day (QID) | ORAL | 0 refills | Status: DC | PRN

## 2020-06-21 MED ORDER — DOCUSATE SODIUM 100 MG PO CAPS: 100.0000 mg | ORAL_CAPSULE | Freq: Two times a day (BID) | ORAL | 0 refills | Status: DC

## 2020-06-21 MED ORDER — OXYCODONE HCL 5 MG PO TABS: 5.0000 mg | ORAL_TABLET | ORAL | 0 refills | Status: AC | PRN

## 2020-06-21 NOTE — Anesthesia Postprocedure Evaluation (Signed)
Anesthesia Post Note  Patient: Raymond Ray  Procedure(s) Performed: TOTAL HIP ARTHROPLASTY ANTERIOR APPROACH (Left Hip)     Patient location during evaluation: Nursing Unit Anesthesia Type: Spinal Level of consciousness: oriented and awake and alert Pain management: pain level controlled Vital Signs Assessment: post-procedure vital signs reviewed and stable Respiratory status: spontaneous breathing and respiratory function stable Cardiovascular status: blood pressure returned to baseline and stable Postop Assessment: no headache, no backache, no apparent nausea or vomiting and patient able to bend at knees Anesthetic complications: no   No complications documented.  Last Vitals:  Vitals:   06/21/20 0034 06/21/20 0504  BP: 109/66 112/70  Pulse: 76 77  Resp: 15 16  Temp: 36.8 C   SpO2: 98% 98%    Last Pain:  Vitals:   06/21/20 0653  TempSrc:   PainSc: 3    Pain Goal: Patients Stated Pain Goal: 2 (06/21/20 0653)  LLE Motor Response: Purposeful movement (06/21/20 0653) LLE Sensation: Full sensation (06/21/20 0653) RLE Motor Response: Purposeful movement (06/21/20 0653) RLE Sensation: Full sensation (06/21/20 0653)        Merlinda Frederick

## 2020-06-21 NOTE — Progress Notes (Signed)
Physical Therapy Treatment Patient Details Name: Raymond Ray MRN: [id number] DOB: 28-Feb-1950 Today's Date: 06/21/2020    History of Present Illness Patient is 70 y.o. male s/p Lt THA anterior approach on 06/20/20 with PMH significant for vertigo, prostate cancer, OA, HTN, Afib, CAD, anemia, Rt THA in 2018.    PT Comments    Pt is POD # 1 and is progressing very well.  Pt demonstrates safe gait & transfers in order to return home from PT perspective once discharged by MD. He has excellent pain control, near normal gait pattern, and was able to perform stairs mimicking home set up.  While in hospital, will continue to benefit from PT for skilled therapy to advance mobility and exercises.      Follow Up Recommendations  Follow surgeon's recommendation for DC plan and follow-up therapies     Equipment Recommendations  None recommended by PT (has DME)    Recommendations for Other Services       Precautions / Restrictions Precautions Precautions: Fall Restrictions Other Position/Activity Restrictions: WBAT    Mobility  Bed Mobility Overal bed mobility: Needs Assistance Bed Mobility: Supine to Sit     Supine to sit: Supervision;HOB elevated     General bed mobility comments: Educated on use of gait belt to Assist L LE    Transfers Overall transfer level: Needs assistance Equipment used: Rolling walker (2 wheeled) Transfers: Sit to/from Stand Sit to Stand: Supervision         General transfer comment: Cues for safe hand placement on first sit to stand with good carry over; performed sit to stand x 3 throughout session  Ambulation/Gait Ambulation/Gait assistance: Supervision Gait Distance (Feet): 250 Feet Assistive device: Rolling walker (2 wheeled) Gait Pattern/deviations: Step-through pattern     General Gait Details: Pt with near normal gait pattern and decreased speed; min cues for posture and education on RW height   Stairs Stairs: Yes Stairs  assistance: Min guard Stair Management: One rail Right;Two rails;Step to pattern;Forwards;With cane Number of Stairs: 14 (14 total with variety of rail situations) General stair comments: Started with bil rails and progressed to one rail on L with cane on R to mimic inside stairs and then progressed to cane on R and HHA on L to mimic outside steps.  Cued for sequence and cane placement; provided written instuctions; pt performed easily and safely   Wheelchair Mobility    Modified Rankin (Stroke Patients Only)       Balance Overall balance assessment: Needs assistance Sitting-balance support: Feet supported Sitting balance-Leahy Scale: Normal     Standing balance support: During functional activity;No upper extremity supported;Single extremity supported Standing balance-Leahy Scale: Fair Standing balance comment: RW for ambulation, cane for steps, but able to static stand without support                            Cognition Arousal/Alertness: Awake/alert Behavior During Therapy: WFL for tasks assessed/performed Overall Cognitive Status: Within Functional Limits for tasks assessed                                        Exercises Total Joint Exercises Ankle Circles/Pumps: AROM;Both;20 reps;Seated Quad Sets: AROM;Both;10 reps;Supine Heel Slides: AAROM;Left;10 reps;Supine Hip ABduction/ADduction: AAROM;Left;10 reps;Supine Long Arc Quad: AROM;Left;10 reps;Supine Other Exercises Other Exercises: Educated on correct exercise form , AAROM techniques, and progression to standing  exercises after first week    General Comments   Educated on safe ice use, no pivots, car transfers, shower transfers, and TED hose during day. Also, encouraged walking every 1-2 hours during day. Educated on HEP with focus on mobility the first weeks. Discussed doing exercises within pain control and if pain increasing could decreased ROM, reps, and stop exercises as needed.  Encouraged to perform ankle pumps frequently for blood flow.  Also, discussed progression to standing exercises after first week.  Discussed could progress to cane, but would use RW for at least a week.      Pertinent Vitals/Pain Pain Assessment: No/denies pain    Home Living                      Prior Function            PT Goals (current goals can now be found in the care plan section) Acute Rehab PT Goals Patient Stated Goal: get back to traveling PT Goal Formulation: With patient/family Time For Goal Achievement: 06/27/20 Potential to Achieve Goals: Good Progress towards PT goals: Progressing toward goals    Frequency    7X/week      PT Plan Current plan remains appropriate    Co-evaluation              AM-PAC PT "6 Clicks" Mobility   Outcome Measure  Help needed turning from your back to your side while in a flat bed without using bedrails?: A Little Help needed moving from lying on your back to sitting on the side of a flat bed without using bedrails?: A Little Help needed moving to and from a bed to a chair (including a wheelchair)?: A Little Help needed standing up from a chair using your arms (e.g., wheelchair or bedside chair)?: A Little Help needed to walk in hospital room?: A Little Help needed climbing 3-5 steps with a railing? : A Little 6 Click Score: 18    End of Session Equipment Utilized During Treatment: Gait belt Activity Tolerance: Patient tolerated treatment well Patient left: in chair;with call bell/phone within reach Nurse Communication: Mobility status PT Visit Diagnosis: Muscle weakness (generalized) (M62.81);Difficulty in walking, not elsewhere classified (R26.2)     Time: 2423-5361 PT Time Calculation (min) (ACUTE ONLY): 39 min  Charges:  $Gait Training: 8-22 mins $Therapeutic Exercise: 8-22 mins $Therapeutic Activity: 8-22 mins                     Abran Richard, PT Acute Rehab Services Pager [phone] Raymond [facility]  [facility] [place] 06/21/2020, 12:12 PM

## 2020-06-21 NOTE — Progress Notes (Signed)
    Subjective:  Patient reports pain as mild to moderate.  Denies N/V/CP/SOB. Patient working with therapy upon my arrival  Objective:   VITALS:   Vitals:   06/20/20 2243 06/21/20 0034 06/21/20 0504 06/21/20 0952  BP: 115/63 109/66 112/70 107/66  Pulse: 75 76 77 79  Resp: 16 15 16 18   Temp: 97.8 F (36.6 C) 98.2 F (36.8 C)  (!) 97.2 F (36.2 C)  TempSrc: Oral   Axillary  SpO2: 99% 98% 98% 98%  Weight:        NAD ABD soft Neurovascular intact Sensation intact distally Intact pulses distally Dorsiflexion/Plantar flexion intact Incision: dressing C/D/I   Lab Results  Component Value Date   WBC 10.8 (H) 06/21/2020   HGB 8.5 (L) 06/21/2020   HCT 25.6 (L) 06/21/2020   MCV 107.1 (H) 06/21/2020   PLT 264 06/21/2020   BMET    Component Value Date/Time   NA 138 06/21/2020 0322   NA 144 08/11/2019 0959   K 4.4 06/21/2020 0322   CL 110 06/21/2020 0322   CO2 21 (L) 06/21/2020 0322   GLUCOSE 207 (H) 06/21/2020 0322   BUN 26 (H) 06/21/2020 0322   BUN 26 08/11/2019 0959   CREATININE 0.99 06/21/2020 0322   CALCIUM 8.2 (L) 06/21/2020 0322   GFRNONAA >60 06/21/2020 0322   GFRAA 60 (L) 10/03/2019 0216     Assessment/Plan: 1 Day Post-Op   Principal Problem:   Osteoarthritis of left hip   WBAT with walker DVT ppx: Apixaban, SCDs, TEDS PO pain control PT/OT ABLA: Asymptomatic Dispo: D/C home     Raymond Ray 06/21/2020, 10:48 AM [facility] is now [address], [place], [place] [address] Phone: [phone] [email or web address] Facebook  [employer]

## 2020-06-21 NOTE — Plan of Care (Signed)
  Problem: Education: Goal: Knowledge of General Education information will improve Description: Including pain rating scale, medication(s)/side effects and non-pharmacologic comfort measures Outcome: Adequate for Discharge   Problem: Health Behavior/Discharge Planning: Goal: Ability to manage health-related needs will improve Outcome: Adequate for Discharge   Problem: Clinical Measurements: Goal: Ability to maintain clinical measurements within normal limits will improve Outcome: Adequate for Discharge Goal: Will remain free from infection Outcome: Adequate for Discharge Goal: Diagnostic test results will improve Outcome: Adequate for Discharge Goal: Cardiovascular complication will be avoided Outcome: Adequate for Discharge   Problem: Activity: Goal: Risk for activity intolerance will decrease Outcome: Adequate for Discharge   Problem: Nutrition: Goal: Adequate nutrition will be maintained Outcome: Adequate for Discharge   Problem: Coping: Goal: Level of anxiety will decrease Outcome: Adequate for Discharge   Problem: Elimination: Goal: Will not experience complications related to bowel motility Outcome: Adequate for Discharge Goal: Will not experience complications related to urinary retention Outcome: Adequate for Discharge   Problem: Pain Managment: Goal: General experience of comfort will improve Outcome: Adequate for Discharge   Problem: Safety: Goal: Ability to remain free from injury will improve Outcome: Adequate for Discharge   Problem: Skin Integrity: Goal: Risk for impaired skin integrity will decrease Outcome: Adequate for Discharge   Problem: Acute Rehab PT Goals(only PT should resolve) Goal: Patient Will Transfer Sit To/From Stand Outcome: Adequate for Discharge Goal: Pt Will Ambulate Outcome: Adequate for Discharge Goal: Pt Will Go Up/Down Stairs Outcome: Adequate for Discharge Goal: Pt/caregiver will Perform Home Exercise Program Outcome:  Adequate for Discharge   Discharge teaching done. Written information given.

## 2020-06-21 NOTE — Discharge Summary (Signed)
Physician Discharge Summary  Patient ID: Raymond Ray MRN: [id number] DOB/AGE: 09/02/1950 70 y.o.  Admit date: 06/20/2020 Discharge date: 06/21/2020  Admission Diagnoses:  Osteoarthritis of left hip  Discharge Diagnoses:  Principal Problem:   Osteoarthritis of left hip   Past Medical History:  Diagnosis Date  . Allergy   . Anemia    history of  . Ascending aortic aneurysm ([place]) 05/29/2020  . CAD in native artery 05/29/2020   35% LAD on cath.  LDL was 74 on 04/2018.  LDL goal is less than 70.  It was 46 on 12/2019.  Continue rosuvastatin.  . Carotid stenosis 05/29/2020  . Chronic atrial fibrillation ([place])   . Dysrhythmia 1970s  . Exertional dyspnea 04/11/2020  . Heart murmur   . History of colonoscopy 04/2001   negative  . Hypertension   . Mitral valve prolapse   . Nonrheumatic mitral (valve) insufficiency   . Osteoarthritis of hip   . Permanent atrial fibrillation ([place])   . PONV (postoperative nausea and vomiting)    left knee cartilage removal;    . Prostate cancer ([place])   . 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 Tricuspid regurgitation   . Vertigo 01/07/2017   currently not symptomatic     Surgeries: Procedure(s): TOTAL HIP ARTHROPLASTY ANTERIOR APPROACH on 06/20/2020   Consultants (if any):   Discharged Condition: Improved  Hospital Course: Raymond Ray is an 70 y.o. male who was admitted 06/20/2020 with a diagnosis of Osteoarthritis of left hip and went to the operating room on 06/20/2020 and underwent the above named procedures.    He was given perioperative antibiotics:  Anti-infectives (From admission, onward)   Start     Dose/Rate Route Frequency Ordered Stop   06/20/20 1400  ceFAZolin (ANCEF) IVPB 2g/100 mL premix        2 g 200 mL/hr over 30 Minutes Intravenous Every 6 hours  06/20/20 1135 06/20/20 2050   06/20/20 0600  ceFAZolin (ANCEF) IVPB 2g/100 mL premix        2 g 200 mL/hr over 30 Minutes Intravenous On call to O.R. 06/20/20 6010 06/20/20 9323    .  He was given sequential compression devices, early ambulation, and apixaban for DVT prophylaxis.  He benefited maximally from the hospital stay and there were no complications.    Recent vital signs:  Vitals:   06/21/20 0504 06/21/20 0952  BP: 112/70 107/66  Pulse: 77 79  Resp: 16 18  Temp:  (!) 97.2 F (36.2 C)  SpO2: 98% 98%    Recent laboratory studies:  Lab Results  Component Value Date   HGB 8.5 (L) 06/21/2020   HGB 10.9 (L) 06/18/2020   HGB 8.2 (L) 10/03/2019   Lab Results  Component Value Date   WBC 10.8 (H) 06/21/2020   PLT 264 06/21/2020   Lab Results  Component Value Date   INR 1.2 06/18/2020   Lab Results  Component Value Date   NA 138 06/21/2020   K 4.4 06/21/2020   CL 110 06/21/2020   CO2 21 (L) 06/21/2020   BUN 26 (H) 06/21/2020   CREATININE 0.99 06/21/2020   GLUCOSE 207 (H) 06/21/2020     WEIGHT BEARING   Weight bearing as tolerated with assist device (walker, cane, etc) as directed, use it as long as suggested by your surgeon or therapist, typically at least  4-6 weeks.   EXERCISES  Results after joint replacement surgery are often greatly improved when you follow the exercise, range of motion and muscle strengthening exercises prescribed by your doctor. Safety measures are also important to protect the joint from further injury. Any time any of these exercises cause you to have increased pain or swelling, decrease what you are doing until you are comfortable again and then slowly increase them. If you have problems or questions, call your caregiver or physical therapist for advice.   Rehabilitation is important following a joint replacement. After just a few days of immobilization, the muscles of the leg can become weakened and shrink (atrophy).  These exercises  are designed to build up the tone and strength of the thigh and leg muscles and to improve motion. Often times heat used for twenty to thirty minutes before working out will loosen up your tissues and help with improving the range of motion but do not use heat for the first two weeks following surgery (sometimes heat can increase post-operative swelling).   These exercises can be done on a training (exercise) mat, on the floor, on a table or on a bed. Use whatever works the best and is most comfortable for you.    Use music or television while you are exercising so that the exercises are a pleasant break in your day. This will make your life better with the exercises acting as a break in your routine that you can look forward to.   Perform all exercises about fifteen times, three times per day or as directed.  You should exercise both the operative leg and the other leg as well.  Exercises include:   . Quad Sets - Tighten up the muscle on the front of the thigh (Quad) and hold for 5-10 seconds.   . Straight Leg Raises - With your knee straight (if you were given a brace, keep it on), lift the leg to 60 degrees, hold for 3 seconds, and slowly lower the leg.  Perform this exercise against resistance later as your leg gets stronger.  . Leg Slides: Lying on your back, slowly slide your foot toward your buttocks, bending your knee up off the floor (only go as far as is comfortable). Then slowly slide your foot back down until your leg is flat on the floor again.  G[place]ard Haring Wings: Lying on your back spread your legs to the side as far apart as you can without causing discomfort.  . Hamstring Strength:  Lying on your back, push your heel against the floor with your leg straight by tightening up the muscles of your buttocks.  Repeat, but this time bend your knee to a comfortable angle, and push your heel against the floor.  You may put a pillow under the heel to make it more comfortable if necessary.   A  rehabilitation program following joint replacement surgery can speed recovery and prevent re-injury in the future due to weakened muscles. Contact your doctor or a physical therapist for more information on knee rehabilitation.    CONSTIPATION  Constipation is defined medically as fewer than three stools per week and severe constipation as less than one stool per week.  Even if you have a regular bowel pattern at home, your normal regimen is likely to be disrupted due to multiple reasons following surgery.  Combination of anesthesia, postoperative narcotics, change in appetite and fluid intake all can affect your bowels.   YOU MUST use at least one of  the following options; they are listed in order of increasing strength to get the job done.  They are all available over the counter, and you may need to use some, POSSIBLY even all of these options:    Drink plenty of fluids (prune juice may be helpful) and high fiber foods Colace 100 mg by mouth twice a day  Senokot for constipation as directed and as needed Dulcolax (bisacodyl), take with full glass of water  Miralax (polyethylene glycol) once or twice a day as needed.  If you have tried all these things and are unable to have a bowel movement in the first 3-4 days after surgery call either your surgeon or your primary doctor.    If you experience loose stools or diarrhea, hold the medications until you stool forms back up.  If your symptoms do not get better within 1 week or if they get worse, check with your doctor.  If you experience "the worst abdominal pain ever" or develop nausea or vomiting, please contact the office immediately for further recommendations for treatment.   ITCHING:  If you experience itching with your medications, try taking only a single pain pill, or even half a pain pill at a time.  You can also use Benadryl over the counter for itching or also to help with sleep.   TED HOSE STOCKINGS:  Use stockings on both legs until  for at least 2 weeks or as directed by physician office. They may be removed at night for sleeping.  MEDICATIONS:  See your medication summary on the "After Visit Summary" that nursing will review with you.  You may have some home medications which will be placed on hold until you complete the course of blood thinner medication.  It is important for you to complete the blood thinner medication as prescribed.  PRECAUTIONS:  If you experience chest pain or shortness of breath - call 911 immediately for transfer to the hospital emergency department.   If you develop a fever greater that 101 F, purulent drainage from wound, increased redness or drainage from wound, foul odor from the wound/dressing, or calf pain - CONTACT YOUR SURGEON.                                                   FOLLOW-UP APPOINTMENTS:  If you do not already have a post-op appointment, please call the office for an appointment to be seen by your surgeon.  Guidelines for how soon to be seen are listed in your "After Visit Summary", but are typically between 1-4 weeks after surgery.  POST-OPERATIVE OPIOID TAPER INSTRUCTIONS: . It is important to wean off of your opioid medication as soon as possible. If you do not need pain medication after your surgery it is ok to stop day one. Marland Kitchen Opioids include: o Codeine, Hydrocodone(Norco, Vicodin), Oxycodone(Percocet, oxycontin) and hydromorphone amongst others.  . Long term and even short term use of opiods can cause: o Increased pain response o Dependence o Constipation o Depression o Respiratory depression o And more.  . Withdrawal symptoms can include o Flu like symptoms o Nausea, vomiting o And more . Techniques to manage these symptoms o Hydrate well o Eat regular healthy meals o Stay active o Use relaxation techniques(deep breathing, meditating, yoga) . Do Not substitute Alcohol to help with tapering . If you have been  on opioids for less than two weeks and do not have pain  than it is ok to stop all together.  . Plan to wean off of opioids o This plan should start within one week post op of your joint replacement. o Maintain the same interval or time between taking each dose and first decrease the dose.  o Cut the total daily intake of opioids by one tablet each day o Next start to increase the time between doses. o The last dose that should be eliminated is the evening dose.      Dental Antibiotics:  In most cases prophylactic antibiotics for Dental procdeures after total joint surgery are not necessary.  Exceptions are as follows:  1. History of prior total joint infection  2. Severely immunocompromised (Organ Transplant, cancer chemotherapy, Rheumatoid biologic meds such as [place])  3. Poorly controlled diabetes (A1C &gt; 8.0, blood glucose over 200)  If you have one of these conditions, contact your surgeon for an antibiotic prescription, prior to your dental procedure.  MAKE SURE YOU:  . Understand these instructions.  . Get help right away if you are not doing well or get worse.    Thank you for letting us be a part of your medical care team.  It is a privilege we respect greatly.  We hope these instructions will help you stay on track for a fast and full recovery!   Diagnostic Studies: DG Pelvis Portable  Result Date: 06/20/2020 CLINICAL DATA:  Postop left hip replacement. EXAM: PORTABLE PELVIS 1-2 VIEWS COMPARISON:  Left hip same day. FINDINGS: Interval left total hip arthroplasty. Femoral stem appears well seated. Subcutaneous and joint air are present. Previous right hip arthroplasty. IMPRESSION: Interval left hip arthroplasty with expected postoperative findings. Electronically Signed   By: Lorin Picket M.D.   On: 06/20/2020 11:12   DG C-Arm 1-60 Min-No Report  Result Date: 06/20/2020 Fluoroscopy was utilized by the requesting physician.  No radiographic interpretation.   DG HIP OPERATIVE UNILAT W OR W/O PELVIS LEFT  Result Date:  06/20/2020 CLINICAL DATA:  70 year old male undergoing left hip arthroplasty. Previous right hip arthroplasty. EXAM: OPERATIVE LEFT HIP (WITH PELVIS IF PERFORMED) 4 VIEWS TECHNIQUE: Fluoroscopic spot image(s) were submitted for interpretation post-operatively. COMPARISON:  CT Abdomen and Pelvis 09/15/2019. FINDINGS: Pre-existing right hip arthroplasty. Four intraoperative AP fluoroscopic spot views of the left hip and lower pelvis demonstrate placement of left bipolar hip arthroplasty. Hardware appears intact with normal AP alignment. No unexpected osseous changes. IMPRESSION: Intraoperative images of left hip arthroplasty, no adverse features. Electronically Signed   By: Genevie Ann M.D.   On: 06/20/2020 09:46   LONG TERM MONITOR (3-14 DAYS)  Result Date: 06/13/2020 3 Day Monitor Quality: Fair.  Baseline artifact. Predominant rhythm: atrial fibrillation Average heart rate: 91 bpm Max heart rate: 202 bpm Min heart rate: 40 bpm Pauses >2.5 seconds: longest 3 seconds Occasional PVCs Tiffany C. Oval Linsey, MD, [facility] 06/13/2020 8:18 AM Patch Wear Time:  3 days and 11 hours (2022-04-09T22:28:33-0400 to 2022-04-13T10:25:16-0400) Atrial Flutter occurred continuously (100% burden), ranging from 40-202 bpm (avg of 91 bpm). 2 Pauses occurred, the longest lasting 3 secs (20 bpm). Isolated VEs were rare (<1.0%), VE Couplets were rare (<1.0%), and no VE Triplets were present. Ventricular Bigeminy was present.    Disposition: Discharge disposition: 01-Home or Self Care       Discharge Instructions    Call MD / Call 911   Complete by: As directed    If you experience chest  pain or shortness of breath, CALL 911 and be transported to the hospital emergency room.  If you develope a fever above 101 F, pus (white drainage) or increased drainage or redness at the wound, or calf pain, call your surgeon's office.   Constipation Prevention   Complete by: As directed    Drink plenty of fluids.  Prune juice may be helpful.  You  may use a stool softener, such as Colace (over the counter) 100 mg twice a day.  Use MiraLax (over the counter) for constipation as needed.   Diet - low sodium heart healthy   Complete by: As directed    Driving restrictions   Complete by: As directed    No driving for 6 weeks   Follow the hip precautions as taught in Physical Therapy   Complete by: As directed    Increase activity slowly as tolerated   Complete by: As directed    Lifting restrictions   Complete by: As directed    No lifting for 6 weeks   Post-operative opioid taper instructions:   Complete by: As directed    POST-OPERATIVE OPIOID TAPER INSTRUCTIONS: It is important to wean off of your opioid medication as soon as possible. If you do not need pain medication after your surgery it is ok to stop day one. Opioids include: Codeine, Hydrocodone(Norco, Vicodin), Oxycodone(Percocet, oxycontin) and hydromorphone amongst others.  Long term and even short term use of opiods can cause: Increased pain response Dependence Constipation Depression Respiratory depression And more.  Withdrawal symptoms can include Flu like symptoms Nausea, vomiting And more Techniques to manage these symptoms Hydrate well Eat regular healthy meals Stay active Use relaxation techniques(deep breathing, meditating, yoga) Do Not substitute Alcohol to help with tapering If you have been on opioids for less than two weeks and do not have pain than it is ok to stop all together.  Plan to wean off of opioids This plan should start within one week post op of your joint replacement. Maintain the same interval or time between taking each dose and first decrease the dose.  Cut the total daily intake of opioids by one tablet each day Next start to increase the time between doses. The last dose that should be eliminated is the evening dose.      TED hose   Complete by: As directed    Use stockings (TED hose) for 2 weeks on both leg(s).  You may  remove them at night for sleeping.       Follow-up Information    Swinteck, Aaron Edelman, MD. Schedule an appointment as soon as possible for a visit in 2 weeks.   Specialty: Orthopedic Surgery Why: For wound re-check Contact information: [address] [place] [address] [id number]                Signed: Dorothyann Peng 06/21/2020, 10:57 AM

## 2020-06-21 NOTE — TOC Transition Note (Signed)
Transition of Care [facility]) - CM/SW Discharge Note  Patient Details  Name: Raymond Ray MRN: [id number] Date of Birth: 1950/09/01  [facility] CM/SW Contact:  Sherie Don, LCSW Phone Number: 06/21/2020, 11:10 AM  Clinical Narrative: Patient is expected to discharge home today. CSW met with patient to review discharge plan. Patient reported he was expecting to discharge home with a home exercise program (HEP), but is agreeable to OPPT if recommended by the orthopedist. Patient has a rolling walker and 3N1 at home, so there are no DME needs at this time. TOC signing off.  Final next level of care: Home/Self Care Barriers to Discharge: No Barriers Identified  Patient Goals and CMS Choice Patient states their goals for this hospitalization and ongoing recovery are:: Discharge home CMS Medicare.gov Compare Post Acute Care list provided to:: Patient Choice offered to / list presented to : NA  Discharge Plan and Services         DME Arranged: N/A DME Agency: NA  Readmission Risk Interventions No flowsheet data found.

## 2020-06-21 NOTE — Anesthesia Procedure Notes (Signed)
Spinal  Patient location during procedure: OR Start time: 06/20/2020 7:45 AM End time: 06/21/2020 7:50 AM Reason for block: surgical anesthesia Staffing Performed: anesthesiologist  Anesthesiologist: Merlinda Frederick, MD Preanesthetic Checklist Completed: patient identified, IV checked, risks and benefits discussed, surgical consent, monitors and equipment checked, pre-op evaluation and timeout performed Spinal Block Patient position: sitting Prep: DuraPrep Patient monitoring: cardiac monitor, continuous pulse ox and blood pressure Approach: midline Location: L3-4 Injection technique: single-shot Needle Needle type: Pencan  Needle gauge: 24 G Needle length: 9 cm Assessment Events: CSF return Additional Notes Functioning IV was confirmed and monitors were applied. Sterile prep and drape, including hand hygiene and sterile gloves were used. The patient was positioned and the spine was prepped. The skin was anesthetized with lidocaine.  Free flow of clear CSF was obtained prior to injecting local anesthetic into the CSF.  The spinal needle aspirated freely following injection.  The needle was carefully withdrawn.  The patient tolerated the procedure well.

## 2020-06-26 ENCOUNTER — Encounter: Payer: Self-pay | Admitting: Thoracic Surgery (Cardiothoracic Vascular Surgery)

## 2020-06-27 ENCOUNTER — Telehealth: Payer: Self-pay | Admitting: Cardiovascular Disease

## 2020-06-27 NOTE — Telephone Encounter (Signed)
Pt c/o medication issue:  1. Name of Medication: metoprolol tartrate (LOPRESSOR) 25 MG tablet  2. How are you currently taking this medication (dosage and times per day)? 1/2 a tablet in the morning and 1/2 a tablet at night   3. Are you having a reaction (difficulty breathing--STAT)? No   4. What is your medication issue? Raymond Ray is calling wanting to confirm he is taking this medication correctly. Please advise.

## 2020-06-27 NOTE — Telephone Encounter (Signed)
Metoprolol tart-Take 1 tablet (25 mg total) by mouth 2 (two) times daily. D/w pt verbalized understanding.

## 2020-07-03 DIAGNOSIS — Z96642 Presence of left artificial hip joint: Secondary | ICD-10-CM | POA: Diagnosis not present

## 2020-07-03 DIAGNOSIS — Z471 Aftercare following joint replacement surgery: Secondary | ICD-10-CM | POA: Diagnosis not present

## 2020-07-31 DIAGNOSIS — Z96642 Presence of left artificial hip joint: Secondary | ICD-10-CM | POA: Diagnosis not present

## 2020-07-31 DIAGNOSIS — Z471 Aftercare following joint replacement surgery: Secondary | ICD-10-CM | POA: Diagnosis not present

## 2020-09-14 DIAGNOSIS — Z471 Aftercare following joint replacement surgery: Secondary | ICD-10-CM | POA: Diagnosis not present

## 2020-09-14 DIAGNOSIS — Z96642 Presence of left artificial hip joint: Secondary | ICD-10-CM | POA: Diagnosis not present

## 2020-09-24 DIAGNOSIS — M5416 Radiculopathy, lumbar region: Secondary | ICD-10-CM | POA: Diagnosis not present

## 2020-09-24 DIAGNOSIS — M25552 Pain in left hip: Secondary | ICD-10-CM | POA: Diagnosis not present

## 2020-09-25 DIAGNOSIS — C61 Malignant neoplasm of prostate: Secondary | ICD-10-CM | POA: Diagnosis not present

## 2020-10-02 DIAGNOSIS — M7062 Trochanteric bursitis, left hip: Secondary | ICD-10-CM | POA: Diagnosis not present

## 2020-10-08 ENCOUNTER — Ambulatory Visit: Payer: Medicare Other | Admitting: Thoracic Surgery (Cardiothoracic Vascular Surgery)

## 2020-10-15 ENCOUNTER — Telehealth: Payer: Self-pay | Admitting: Cardiovascular Disease

## 2020-10-15 ENCOUNTER — Encounter (HOSPITAL_BASED_OUTPATIENT_CLINIC_OR_DEPARTMENT_OTHER): Payer: Self-pay

## 2020-10-15 NOTE — Telephone Encounter (Signed)
   Pt c/o medication issue:  1. Name of Medication: metoprolol tartrate (LOPRESSOR) 25 MG tablet  2. How are you currently taking this medication (dosage and times per day)? Take 1 tablet (25 mg total) by mouth 2 (two) times daily.  3. Are you having a reaction (difficulty breathing--STAT)?   4. What is your medication issue? Pt would like to speak with a nurse regarding this meds

## 2020-10-15 NOTE — Telephone Encounter (Signed)
Spoke with the pt and clarified he is taking the right dose of Metoprolol as per his My Chart message and he feels the pharmacy filled an old RX. He will talk with the pharmacy and be sure prior to his next refill.

## 2020-10-16 DIAGNOSIS — M7062 Trochanteric bursitis, left hip: Secondary | ICD-10-CM | POA: Diagnosis not present

## 2020-10-19 DIAGNOSIS — H2513 Age-related nuclear cataract, bilateral: Secondary | ICD-10-CM | POA: Diagnosis not present

## 2020-10-19 DIAGNOSIS — N5201 Erectile dysfunction due to arterial insufficiency: Secondary | ICD-10-CM | POA: Diagnosis not present

## 2020-10-19 DIAGNOSIS — C61 Malignant neoplasm of prostate: Secondary | ICD-10-CM | POA: Diagnosis not present

## 2020-10-19 DIAGNOSIS — M7062 Trochanteric bursitis, left hip: Secondary | ICD-10-CM | POA: Diagnosis not present

## 2020-10-22 DIAGNOSIS — M25552 Pain in left hip: Secondary | ICD-10-CM | POA: Diagnosis not present

## 2020-10-23 DIAGNOSIS — M7062 Trochanteric bursitis, left hip: Secondary | ICD-10-CM | POA: Diagnosis not present

## 2020-10-26 DIAGNOSIS — M7062 Trochanteric bursitis, left hip: Secondary | ICD-10-CM | POA: Diagnosis not present

## 2020-10-30 DIAGNOSIS — M7062 Trochanteric bursitis, left hip: Secondary | ICD-10-CM | POA: Diagnosis not present

## 2020-11-02 DIAGNOSIS — M7062 Trochanteric bursitis, left hip: Secondary | ICD-10-CM | POA: Diagnosis not present

## 2020-11-10 ENCOUNTER — Other Ambulatory Visit: Payer: Self-pay | Admitting: Cardiovascular Disease

## 2020-11-12 NOTE — Telephone Encounter (Signed)
Prescription refill request for Eliquis received. Indication:AFIB Last office visit: 05/29/20 Scr:0.99 06/21/20 Age: 55M Weight:78.1KG

## 2020-11-16 DIAGNOSIS — M7062 Trochanteric bursitis, left hip: Secondary | ICD-10-CM | POA: Diagnosis not present

## 2020-11-22 DIAGNOSIS — D649 Anemia, unspecified: Secondary | ICD-10-CM | POA: Diagnosis not present

## 2020-11-22 DIAGNOSIS — Z125 Encounter for screening for malignant neoplasm of prostate: Secondary | ICD-10-CM | POA: Diagnosis not present

## 2020-11-22 DIAGNOSIS — E785 Hyperlipidemia, unspecified: Secondary | ICD-10-CM | POA: Diagnosis not present

## 2020-12-11 DIAGNOSIS — L111 Transient acantholytic dermatosis [Grover]: Secondary | ICD-10-CM | POA: Diagnosis not present

## 2020-12-11 DIAGNOSIS — N1831 Chronic kidney disease, stage 3a: Secondary | ICD-10-CM | POA: Diagnosis not present

## 2020-12-11 DIAGNOSIS — G609 Hereditary and idiopathic neuropathy, unspecified: Secondary | ICD-10-CM | POA: Diagnosis not present

## 2020-12-11 DIAGNOSIS — I2510 Atherosclerotic heart disease of native coronary artery without angina pectoris: Secondary | ICD-10-CM | POA: Diagnosis not present

## 2020-12-11 DIAGNOSIS — E785 Hyperlipidemia, unspecified: Secondary | ICD-10-CM | POA: Diagnosis not present

## 2020-12-11 DIAGNOSIS — Z0000 Encounter for general adult medical examination without abnormal findings: Secondary | ICD-10-CM | POA: Diagnosis not present

## 2020-12-11 DIAGNOSIS — Z1212 Encounter for screening for malignant neoplasm of rectum: Secondary | ICD-10-CM | POA: Diagnosis not present

## 2020-12-11 DIAGNOSIS — Z1331 Encounter for screening for depression: Secondary | ICD-10-CM | POA: Diagnosis not present

## 2020-12-11 DIAGNOSIS — Z23 Encounter for immunization: Secondary | ICD-10-CM | POA: Diagnosis not present

## 2020-12-11 DIAGNOSIS — D649 Anemia, unspecified: Secondary | ICD-10-CM | POA: Diagnosis not present

## 2020-12-11 DIAGNOSIS — D6869 Other thrombophilia: Secondary | ICD-10-CM | POA: Diagnosis not present

## 2020-12-11 DIAGNOSIS — R82998 Other abnormal findings in urine: Secondary | ICD-10-CM | POA: Diagnosis not present

## 2020-12-11 DIAGNOSIS — I4811 Longstanding persistent atrial fibrillation: Secondary | ICD-10-CM | POA: Diagnosis not present

## 2020-12-12 ENCOUNTER — Telehealth: Payer: Self-pay | Admitting: Hematology and Oncology

## 2020-12-12 NOTE — Telephone Encounter (Signed)
Scheduled appt per 10/18 referral. Pt is aware of appt date and time.

## 2020-12-14 DIAGNOSIS — M7062 Trochanteric bursitis, left hip: Secondary | ICD-10-CM | POA: Diagnosis not present

## 2021-01-07 ENCOUNTER — Inpatient Hospital Stay: Payer: Medicare Other | Attending: Hematology and Oncology | Admitting: Hematology and Oncology

## 2021-01-07 ENCOUNTER — Inpatient Hospital Stay: Payer: Medicare Other

## 2021-01-07 ENCOUNTER — Other Ambulatory Visit: Payer: Self-pay

## 2021-01-07 VITALS — BP 112/65 | HR 75 | Temp 98.2°F | Resp 18 | Wt 174.6 lb

## 2021-01-07 DIAGNOSIS — Z803 Family history of malignant neoplasm of breast: Secondary | ICD-10-CM | POA: Diagnosis not present

## 2021-01-07 DIAGNOSIS — D539 Nutritional anemia, unspecified: Secondary | ICD-10-CM | POA: Insufficient documentation

## 2021-01-07 DIAGNOSIS — I119 Hypertensive heart disease without heart failure: Secondary | ICD-10-CM | POA: Insufficient documentation

## 2021-01-07 DIAGNOSIS — I2510 Atherosclerotic heart disease of native coronary artery without angina pectoris: Secondary | ICD-10-CM | POA: Diagnosis not present

## 2021-01-07 DIAGNOSIS — Z8546 Personal history of malignant neoplasm of prostate: Secondary | ICD-10-CM | POA: Insufficient documentation

## 2021-01-07 LAB — CBC WITH DIFFERENTIAL (CANCER CENTER ONLY)
Abs Immature Granulocytes: 0.01 10*3/uL (ref 0.00–0.07)
Basophils Absolute: 0.1 10*3/uL (ref 0.0–0.1)
Basophils Relative: 1 %
Eosinophils Absolute: 0.2 10*3/uL (ref 0.0–0.5)
Eosinophils Relative: 2 %
HCT: 34.8 % — ABNORMAL LOW (ref 39.0–52.0)
Hemoglobin: 11.5 g/dL — ABNORMAL LOW (ref 13.0–17.0)
Immature Granulocytes: 0 %
Lymphocytes Relative: 24 %
Lymphs Abs: 1.6 10*3/uL (ref 0.7–4.0)
MCH: 35.9 pg — ABNORMAL HIGH (ref 26.0–34.0)
MCHC: 33 g/dL (ref 30.0–36.0)
MCV: 108.8 fL — ABNORMAL HIGH (ref 80.0–100.0)
Monocytes Absolute: 0.5 10*3/uL (ref 0.1–1.0)
Monocytes Relative: 8 %
Neutro Abs: 4.4 10*3/uL (ref 1.7–7.7)
Neutrophils Relative %: 65 %
Platelet Count: 350 10*3/uL (ref 150–400)
RBC: 3.2 MIL/uL — ABNORMAL LOW (ref 4.22–5.81)
RDW: 15.9 % — ABNORMAL HIGH (ref 11.5–15.5)
WBC Count: 6.8 10*3/uL (ref 4.0–10.5)
nRBC: 0 % (ref 0.0–0.2)

## 2021-01-07 LAB — CMP (CANCER CENTER ONLY)
ALT: 13 U/L (ref 0–44)
AST: 23 U/L (ref 15–41)
Albumin: 4.5 g/dL (ref 3.5–5.0)
Alkaline Phosphatase: 67 U/L (ref 38–126)
Anion gap: 10 (ref 5–15)
BUN: 24 mg/dL — ABNORMAL HIGH (ref 8–23)
CO2: 25 mmol/L (ref 22–32)
Calcium: 9 mg/dL (ref 8.9–10.3)
Chloride: 108 mmol/L (ref 98–111)
Creatinine: 1.24 mg/dL (ref 0.61–1.24)
GFR, Estimated: >60 mL/min (ref >60)
Glucose, Bld: 91 mg/dL (ref 70–99)
Potassium: 4.7 mmol/L (ref 3.5–5.1)
Sodium: 143 mmol/L (ref 135–145)
Total Bilirubin: 1.5 mg/dL — ABNORMAL HIGH (ref 0.3–1.2)
Total Protein: 6.5 g/dL (ref 6.5–8.1)

## 2021-01-07 LAB — RETIC PANEL
Immature Retic Fract: 11.6 % (ref 2.3–15.9)
RBC.: 3.16 MIL/uL — ABNORMAL LOW (ref 4.22–5.81)
Retic Count, Absolute: 34.1 10*3/uL (ref 19.0–186.0)
Retic Ct Pct: 1.1 % (ref 0.4–3.1)
Reticulocyte Hemoglobin: 34.6 pg (ref >27.9)

## 2021-01-07 LAB — VITAMIN B12: Vitamin B-12: 749 pg/mL (ref 180–914)

## 2021-01-07 LAB — IRON AND TIBC
Iron: 172 ug/dL — ABNORMAL HIGH (ref 42–163)
Saturation Ratios: 61 % — ABNORMAL HIGH (ref 20–55)
TIBC: 281 ug/dL (ref 202–409)
UIBC: 110 ug/dL — ABNORMAL LOW (ref 117–376)

## 2021-01-07 LAB — FERRITIN: Ferritin: 318 ng/mL (ref 24–336)

## 2021-01-07 LAB — DIRECT ANTIGLOBULIN TEST (NOT AT ARMC)
DAT, IgG: NEGATIVE
DAT, complement: NEGATIVE

## 2021-01-07 LAB — LACTATE DEHYDROGENASE: LDH: 180 U/L (ref 98–192)

## 2021-01-07 LAB — FOLATE: Folate: 36.2 ng/mL (ref >5.9)

## 2021-01-07 NOTE — Progress Notes (Signed)
[place] [phone]   Fax:([phone] Susan Moore NOTE  Patient Care Team: Crist Infante, MD as PCP - General (Internal Medicine) [name], MD as PCP - Cardiology (Cardiology) Alda Berthold, DO as Consulting Physician (Neurology) Cira Rue, RN Nurse Navigator as Registered Nurse (Medical Oncology)  Hematological/Oncological History # Macrocytic Anemia 03/03/2011: WBC 4.1, Hgb 12.7, MCV 104.6, Plt 203 01/30/2017: WBC 12.1, Hgb 9.2, MCV 100.7, Plt 232 09/28/2019: WBC 24, Hgb 9.3, MCV 107.9, Plt 189 06/21/2020: WBC 10.8, Hgb 8.5, MCV 107.1, Plt 264 01/07/2021: establish care with Dr. Lorenso Courier   CHIEF COMPLAINTS/PURPOSE OF CONSULTATION:  "Anemia "  HISTORY OF PRESENTING ILLNESS:  Raymond Ray 70 y.o. male with medical history significant for CAD, atrial fibrillation, MVP, prostate cancer, and carotid stenosis who presents for evaluation of longstanding macrocytic anemia.   On review of the previous records Mr. Pfenning has a longstanding macrocytic anemia dating back to at least 03/03/2011.  At that time he was noted to have a hemoglobin of 12.7 with an MCV of 104.6.  More recently on 01/30/2017 was found to have a hemoglobin 9.2 with MCV of 100.7.  Most recent labs in our system show a hemoglobin of 8.5 with an MCV of 107.1 on 06/21/2020.  Due to concern for these findings the patient was referred to hematology for further evaluation and management.  On exam today Mr. Wanninger reports that he has had "borderline anemia" for years.  He reports his new primary care provider wanted a double check on this.  He notes that he has a regular diet and eats red meat with no restrictions.  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  He notes he has undergone for surgery in the last 2 to 3 years including 2 hip replacements and a heart valve replacement.  He notes he does not have any issues with shortness of breath.  He notes that he does have a history  of prostate cancer status post resection with urology and is currently following with them.  On further discussion he notes that he is a never smoker.  His family history is remarkable for breast cancer in his sister and father who passed away of old age.  He is a retired Theme park manager.  He otherwise denies any fevers, chills, sweats, nausea, vomiting or diarrhea.  A full 10 point ROS is listed below.  MEDICAL HISTORY:  Past Medical History:  Diagnosis Date   Allergy    Anemia    history of   Ascending aortic aneurysm ([place]) 05/29/2020   CAD in native artery 05/29/2020   35% LAD on cath.  LDL was 74 on 04/2018.  LDL goal is less than 70.  It was 46 on 12/2019.  Continue rosuvastatin.   Carotid stenosis 05/29/2020   Chronic atrial fibrillation ([place])    Dysrhythmia 1970s   Exertional dyspnea 04/11/2020   Heart murmur    History of colonoscopy 04/2001   negative   Hypertension    Mitral valve prolapse    Nonrheumatic mitral (valve) insufficiency    Osteoarthritis of hip    Permanent atrial fibrillation (HCC)    PONV (postoperative nausea and vomiting)    left knee cartilage removal;     Prostate cancer (HCC)    S/P minimally-invasive mitral valve repair 09/28/2019   Complex valvuloplasty including artificial Gore-tex neochord placement x6 with 36 mm Medtronic Sinuform annuloplasty ring via right mini thoracotomy approach   Tricuspid regurgitation  Vertigo 01/07/2017   currently not symptomatic     SURGICAL HISTORY: Past Surgical History:  Procedure Laterality Date   BUBBLE STUDY  08/24/2019   Procedure: BUBBLE STUDY;  Surgeon: [name], MD;  Location: [facility];  Service: Cardiology;;   CARDIAC CATHETERIZATION  09/28/2019   CLIPPING OF ATRIAL APPENDAGE N/A 09/28/2019   Procedure: CLIPPING OF ATRIAL APPENDAGE USING ATRICURE CLIP SIZE 45MM;  Surgeon: Rexene Alberts, MD;  Location: [facility];  Service: Open Heart Surgery;  Laterality: N/A;   COLONOSCOPY     KNEE SURGERY     left at age  32 in [place] Bilateral 06/02/2019   Procedure: LYMPHADENECTOMY, PELVIC;  Surgeon: [name], MD;  Location: [facility];  Service: Urology;  Laterality: Bilateral;   MINIMALLY INVASIVE TRICUSPID VALVE REPAIR Right 09/28/2019   Procedure: possible MINIMALLY INVASIVE TRICUSPID VALVE REPAIR;  Surgeon: Rexene Alberts, MD;  Location: [facility];  Service: Open Heart Surgery;  Laterality: Right;   MITRAL VALVE REPAIR Right 09/28/2019   Procedure: MINIMALLY INVASIVE MITRAL VALVE REPAIR (MVR) USING SIMUFORM 36MM;  Surgeon: Rexene Alberts, MD;  Location: [facility];  Service: Open Heart Surgery;  Laterality: Right;   PROSTATE BIOPSY     RIGHT/LEFT HEART CATH AND CORONARY ANGIOGRAPHY N/A 09/07/2019   Procedure: RIGHT/LEFT HEART CATH AND CORONARY ANGIOGRAPHY;  Surgeon: Sherren Mocha, MD;  Location: [facility];  Service: Cardiovascular;  Laterality: N/A;   ROBOT ASSISTED LAPAROSCOPIC RADICAL PROSTATECTOMY N/A 06/02/2019   Procedure: XI ROBOTIC ASSISTED LAPAROSCOPIC RADICAL PROSTATECTOMY LEVEL 2;  Surgeon: [name], MD;  Location: [facility];  Service: Urology;  Laterality: N/A;   spinal injection     December 2013   TEE WITHOUT CARDIOVERSION N/A 08/24/2019   Procedure: TRANSESOPHAGEAL ECHOCARDIOGRAM (TEE);  Surgeon: [name], MD;  Location: [facility];  Service: Cardiology;  Laterality: N/A;   TEE WITHOUT CARDIOVERSION N/A 09/28/2019   Procedure: TRANSESOPHAGEAL ECHOCARDIOGRAM (TEE);  Surgeon: Rexene Alberts, MD;  Location: [facility];  Service: Open Heart Surgery;  Laterality: N/A;   TOTAL HIP ARTHROPLASTY Right 01/29/2017   Procedure: RIGHT TOTAL HIP ARTHROPLASTY ANTERIOR APPROACH;  Surgeon: Rod Can, MD;  Location[facility];  Service: Orthopedics;  Laterality: Right;   TOTAL HIP ARTHROPLASTY Left 06/20/2020   Procedure: TOTAL HIP ARTHROPLASTY ANTERIOR APPROACH;  Surgeon: Rod Can, MD;  Location[facility];  Service: Orthopedics;  Laterality: Left;   VASECTOMY       SOCIAL HISTORY: Social History   Socioeconomic History   Marital status: Married    Spouse name: [name]   Number of children: 2   Years of education: Not on file   Highest education level: Not on file  Occupational History   Occupation: Retired  Tobacco Use   Smoking status: Never   Smokeless tobacco: Never  Vaping Use   Vaping Use: Never used  Substance and Sexual Activity   Alcohol use: No   Drug use: No   Sexual activity: Yes  Other Topics Concern   Not on file  Social History Narrative   Right handed   Lives in two story home with wife   Daughter, [name], resides in [place]. 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 Daughter, [name], resides in [place]. 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    Social Determinants of Health   Financial Resource Strain: Not on file  Food Insecurity: Not on file  Transportation Needs: Not on file  Physical Activity: Not on file  Stress: Not  on file  Social Connections: Not on file  Intimate Partner Violence: Not on file    FAMILY HISTORY: Family History  Problem Relation Age of Onset   Arrhythmia Father        tachycardia   Cancer Mother    Heart attack Mother    Diabetes Mother    Breast cancer Sister    Breast cancer Paternal Aunt    Colon cancer Neg Hx    Esophageal cancer Neg Hx    Stomach cancer Neg Hx    Rectal cancer Neg Hx     ALLERGIES:  has No Known Allergies.  MEDICATIONS:  Current Outpatient Medications  Medication Sig Dispense Refill   apixaban (ELIQUIS) 5 MG TABS tablet TAKE 1 TABLET BY MOUTH TWICE A DAY 180 tablet 1   ascorbic acid (VITAMIN C) 500 MG tablet Take 500 mg by mouth daily.      digoxin (LANOXIN) 0.125 MG tablet Take 1 tablet twice a day for 2 days and then daily (Patient taking differently: Take 0.125 mg by mouth daily.) 92 tablet 3   fexofenadine (ALLEGRA) 180 MG tablet Take 180 mg by mouth daily as needed for allergies.     hydrocortisone cream 1 % Apply 1 application topically 2 (two) times daily as  needed for itching.     metoprolol tartrate (LOPRESSOR) 25 MG tablet Take 1 tablet (25 mg total) by mouth 2 (two) times daily. 180 tablet 3   Multiple Vitamins-Minerals (MULTIVITAMIN WITH MINERALS) tablet Take 1 tablet by mouth daily. [facility]      oxyCODONE-acetaminophen (PERCOCET) 10-325 MG tablet Take 0.5 tablets by mouth every 8 (eight) hours as needed for pain. Pt takes half tablet     polyethylene glycol (MIRALAX / GLYCOLAX) 17 g packet Take 17 g by mouth daily.     rosuvastatin (CRESTOR) 5 MG tablet Take 5 mg by mouth daily.     sildenafil (VIAGRA) 100 MG tablet Take 100 mg by mouth as needed for erectile dysfunction.     zolpidem (AMBIEN) 5 MG tablet Take 5 mg by mouth at bedtime.     docusate sodium (COLACE) 100 MG capsule Take 1 capsule (100 mg total) by mouth 2 (two) times daily. 60 capsule 0   ondansetron (ZOFRAN) 4 MG tablet Take 1 tablet (4 mg total) by mouth every 6 (six) hours as needed for nausea. 20 tablet 0   senna (SENOKOT) 8.6 MG TABS tablet Take 1 tablet (8.6 mg total) by mouth 2 (two) times daily. 120 tablet 0   No current facility-administered medications for this visit.    REVIEW OF SYSTEMS:   Constitutional: ( - ) fevers, ( - )  chills , ( - ) night sweats Eyes: ( - ) blurriness of vision, ( - ) double vision, ( - ) watery eyes Ears, nose, mouth, throat, and face: ( - ) mucositis, ( - ) sore throat Respiratory: ( - ) cough, ( - ) dyspnea, ( - ) wheezes Cardiovascular: ( - ) palpitation, ( - ) chest discomfort, ( - ) lower extremity swelling Gastrointestinal:  ( - ) nausea, ( - ) heartburn, ( - ) change in bowel habits Skin: ( - ) abnormal skin rashes Lymphatics: ( - ) new lymphadenopathy, ( - ) easy bruising Neurological: ( - ) numbness, ( - ) tingling, ( - ) new weaknesses Behavioral/Psych: ( - ) mood change, ( - ) new changes  All other systems were reviewed with the patient and are negative.  PHYSICAL EXAMINATION: ECOG PERFORMANCE STATUS: 0 -  Asymptomatic  Vitals:   01/07/21 1344  BP: 112/65  Pulse: 75  Resp: 18  Temp: 98.2 F (36.8 C)  SpO2: 100%   Filed Weights   01/07/21 1344  Weight: 174 lb 9.6 oz (79.2 kg)    GENERAL: well appearing well appearing elderly Caucasian male, in NAD  SKIN: skin color, texture, turgor are normal, no rashes or significant lesions EYES: conjunctiva are pink and non-injected, sclera clear LUNGS: clear to auscultation and percussion with normal breathing effort HEART: regular rate & rhythm and no murmurs and no lower extremity edema Musculoskeletal: no cyanosis of digits and no clubbing  PSYCH: alert & oriented x 3, fluent speech NEURO: no focal motor/sensory deficits  LABORATORY DATA:  I have reviewed the data as listed CBC Latest Ref Rng & Units 06/21/2020 06/18/2020 10/03/2019  WBC 4.0 - 10.5 K/uL 10.8(H) 7.2 8.1  Hemoglobin 13.0 - 17.0 g/dL 8.5(L) 10.9(L) 8.2(L)  Hematocrit 39.0 - 52.0 % 25.6(L) 32.9(L) 24.6(L)  Platelets 150 - 400 K/uL 264 311 248    CMP Latest Ref Rng & Units 06/21/2020 06/18/2020 10/14/2019  Glucose 70 - 99 mg/dL 207(H) 99 -  BUN 8 - 23 mg/dL 26(H) 22 -  Creatinine 0.61 - 1.24 mg/dL 0.99 1.03 2.30(A)  Sodium 135 - 145 mmol/L 138 144 -  Potassium 3.5 - 5.1 mmol/L 4.4 4.8 -  Chloride 98 - 111 mmol/L 110 110 -  CO2 22 - 32 mmol/L 21(L) 26 -  Calcium 8.9 - 10.3 mg/dL 8.2(L) 9.4 -  Total Protein 6.5 - 8.1 g/dL - 6.8 -  Total Bilirubin 0.3 - 1.2 mg/dL - 1.1 -  Alkaline Phos 38 - 126 U/L - 53 -  AST 15 - 41 U/L - 15 -  ALT 0 - 44 U/L - 12 -    RADIOGRAPHIC STUDIES: No results found.  ASSESSMENT & PLAN Aero Drummonds 70 y.o. male with medical history significant for CAD, atrial fibrillation, MVP, prostate cancer, and carotid stenosis who presents for evaluation of longstanding macrocytic anemia.   After review of the labs, review of the records, and discussion with the patient the patients findings are most consistent with a longstanding macrocytic anemia  of unclear etiology.  At this time would recommend pursuing full work-up to include vitamin B12, folate, methylmalonic acid, and homocystine.  We will also order copper levels and a peripheral blood film.  Additionally we will order lysis labs to include LDH, haptoglobin, and DAT.  If no clear etiology can be found from this initial work-up will need to consider pursuing bone marrow biopsy.  The patient voices understanding of this plan moving forward.  # Macrocytic Anemia -- Findings are concerning for macrocytic anemia with no clear etiology.  It has been chronic since at least 2013. --We will pursue vitamin B12, folate, methylmalonic acid, and homocystine --Additionally will check copper, DAT, LDH, haptoglobin --We will start bone marrow disorder work-up with SPEP and serum free light chains --If no clear etiology can be found from the above lab work will need to pursue bone marrow biopsy. --We will make placeholder appointment for 3 months from now, however if no clear etiology can be found we will need to pursue bone marrow biopsy we will see him after those results.  Orders Placed This Encounter  Procedures   CBC with Differential ([facility] Only)    Standing Status:   Future    Standing Expiration Date:   01/07/2022  CMP ([facility] only)    Standing Status:   Future    Standing Expiration Date:   01/07/2022   Ferritin    Standing Status:   Future    Standing Expiration Date:   01/07/2022   Iron and TIBC    Standing Status:   Future    Standing Expiration Date:   01/07/2022   Lactate dehydrogenase (LDH)    Standing Status:   Future    Standing Expiration Date:   01/07/2022   Retic Panel    Standing Status:   Future    Number of Occurrences:   1    Standing Expiration Date:   01/07/2022   Vitamin B12    Standing Status:   Future    Number of Occurrences:   1    Standing Expiration Date:   01/07/2022   Folate, Serum    Standing Status:   Future    Number of  Occurrences:   1    Standing Expiration Date:   01/07/2022   Methylmalonic acid, serum    Standing Status:   Future    Number of Occurrences:   1    Standing Expiration Date:   01/07/2022   Homocysteine, serum    Standing Status:   Future    Number of Occurrences:   1    Standing Expiration Date:   01/07/2022   Haptoglobin    Standing Status:   Future    Number of Occurrences:   1    Standing Expiration Date:   01/07/2022   Multiple Myeloma Panel (SPEP&IFE w/QIG)    Standing Status:   Future    Number of Occurrences:   1    Standing Expiration Date:   01/07/2022   Kappa/lambda light chains    Standing Status:   Future    Number of Occurrences:   1    Standing Expiration Date:   01/07/2022   Copper, serum    Standing Status:   Future    Number of Occurrences:   1    Standing Expiration Date:   01/07/2022   Direct antiglobulin test ([facility])    Standing Status:   Future    Number of Occurrences:   1    Standing Expiration Date:   01/07/2022    All questions were answered. The patient knows to call the clinic with any problems, questions or concerns.  A total of more than 60 minutes were spent on this encounter with face-to-face time and non-face-to-face time, including preparing to see the patient, ordering tests and/or medications, counseling the patient and coordination of care as outlined above.   Ledell Peoples, MD Department of Hematology/Oncology [place] at [facility] Phone: [phone] Pager: [phone] Email: [email or web address]  01/07/2021 3:51 PM

## 2021-01-08 LAB — HAPTOGLOBIN: Haptoglobin: 95 mg/dL (ref 32–363)

## 2021-01-08 LAB — HOMOCYSTEINE: Homocysteine: 16 umol/L (ref 0.0–17.2)

## 2021-01-09 ENCOUNTER — Telehealth: Payer: Self-pay | Admitting: *Deleted

## 2021-01-09 DIAGNOSIS — D649 Anemia, unspecified: Secondary | ICD-10-CM | POA: Diagnosis not present

## 2021-01-09 DIAGNOSIS — E785 Hyperlipidemia, unspecified: Secondary | ICD-10-CM | POA: Diagnosis not present

## 2021-01-09 DIAGNOSIS — Z23 Encounter for immunization: Secondary | ICD-10-CM | POA: Diagnosis not present

## 2021-01-09 LAB — KAPPA/LAMBDA LIGHT CHAINS
Kappa free light chain: 27.7 mg/L — ABNORMAL HIGH (ref 3.3–19.4)
Kappa, lambda light chain ratio: 1.59 (ref 0.26–1.65)
Lambda free light chains: 17.4 mg/L (ref 5.7–26.3)

## 2021-01-09 NOTE — Telephone Encounter (Signed)
Received vm message from pt inquiring about his lab results. Advised that his iron studies came back fairly normal. Advised that we are still waiting on the multiple myeloma panel results. They may not be complete until the beginning of the week.  Pt voiced understanding and will wait for those results.  "I'm trying to avoid that bone marrow biopsy" Reassured pt that we will call him as soon as results are back.  He voiced understanding.

## 2021-01-10 ENCOUNTER — Telehealth: Payer: Self-pay | Admitting: *Deleted

## 2021-01-10 ENCOUNTER — Telehealth: Payer: Self-pay | Admitting: Hematology and Oncology

## 2021-01-10 LAB — MULTIPLE MYELOMA PANEL, SERUM
Albumin SerPl Elph-Mcnc: 4.3 g/dL (ref 2.9–4.4)
Albumin/Glob SerPl: 2 — ABNORMAL HIGH (ref 0.7–1.7)
Alpha 1: 0.2 g/dL (ref 0.0–0.4)
Alpha2 Glob SerPl Elph-Mcnc: 0.5 g/dL (ref 0.4–1.0)
B-Globulin SerPl Elph-Mcnc: 0.8 g/dL (ref 0.7–1.3)
Gamma Glob SerPl Elph-Mcnc: 0.6 g/dL (ref 0.4–1.8)
Globulin, Total: 2.2 g/dL (ref 2.2–3.9)
IgA: 223 mg/dL (ref 61–437)
IgG (Immunoglobin G), Serum: 688 mg/dL (ref 603–1613)
IgM (Immunoglobulin M), Srm: 37 mg/dL (ref 20–172)
Total Protein ELP: 6.5 g/dL (ref 6.0–8.5)

## 2021-01-10 LAB — METHYLMALONIC ACID, SERUM: Methylmalonic Acid, Quantitative: 376 nmol/L (ref 0–378)

## 2021-01-10 LAB — COPPER, SERUM: Copper: 74 ug/dL (ref 69–132)

## 2021-01-10 NOTE — Telephone Encounter (Signed)
Called [name] to discuss the results of his blood work.  Findings are consistent with a macrocytic anemia with no clear etiology.  Fortunately his hemoglobin has improved to 11.5 up from 8.5 during his last check in April 2022.  There is no evidence of a nutritional deficiency, multiple myeloma, or hemolytic anemia.  With a macrocytic anemia of this nature I would recommend pursuing a bone marrow biopsy.  The patient is hesitant to pursue a bone marrow biopsy at this time.  Given his hemoglobin of 11.5 and relatively stable macrocytic anemia over many years I do believe it would be reasonable to continue observation if he so chose.  Patient notes he would like time to consider the bone marrow biopsy and will get back to [place] after Thanksgiving regarding the procedure.  We will look forward to hearing back from him at that time.  Ledell Peoples, MD Department of Hematology/Oncology [facility] at [facility] Phone: [phone] Pager: [phone] Email: [email or web address]

## 2021-01-10 NOTE — Telephone Encounter (Signed)
-----  Message from Orson Slick, MD sent at 01/10/2021  2:40 PM EST ----- Please let Mr. Reinitz know that his lab work showed no clear reason for his macrocytic anemia. I would recommend proceeding with a bone marrow biopsy, if he is agreeable.   ----- Message ----- From: Interface, Lab In [facility] Sent: 01/07/2021   2:45 PM EST To: Orson Slick, MD

## 2021-01-10 NOTE — Telephone Encounter (Signed)
-----  Message from Orson Slick, MD sent at 01/10/2021  2:40 PM EST ----- Please let Raymond Ray know that his lab work showed no clear reason for his macrocytic anemia. I would recommend proceeding with a bone marrow biopsy, if he is agreeable.   ----- Message ----- From: Interface, Lab In [facility] Sent: 01/07/2021   2:45 PM EST To: Orson Slick, MD

## 2021-01-10 NOTE — Telephone Encounter (Signed)
Received call back from pt. Explained to him that his recent labs did not explain his macrocytic anemia and that Dr. Lorenso Courier recommends a bone marrow biopsy. Pt expressed that he would like to talk to Dr. Lorenso Courier about this. Advised that I would have Dr. Lorenso Courier call him.  He appreciated that very much.

## 2021-01-10 NOTE — Telephone Encounter (Signed)
TCT to patient regarding recent lab results. No answer. VM message left for pt to return this call at his soonest convenience @ [phone]

## 2021-01-11 ENCOUNTER — Other Ambulatory Visit: Payer: Self-pay | Admitting: Hematology and Oncology

## 2021-01-11 DIAGNOSIS — D539 Nutritional anemia, unspecified: Secondary | ICD-10-CM

## 2021-01-29 DIAGNOSIS — M5416 Radiculopathy, lumbar region: Secondary | ICD-10-CM | POA: Diagnosis not present

## 2021-01-29 DIAGNOSIS — M1612 Unilateral primary osteoarthritis, left hip: Secondary | ICD-10-CM | POA: Diagnosis not present

## 2021-01-29 DIAGNOSIS — M5136 Other intervertebral disc degeneration, lumbar region: Secondary | ICD-10-CM | POA: Diagnosis not present

## 2021-01-29 DIAGNOSIS — D6869 Other thrombophilia: Secondary | ICD-10-CM | POA: Diagnosis not present

## 2021-01-29 DIAGNOSIS — I4891 Unspecified atrial fibrillation: Secondary | ICD-10-CM | POA: Diagnosis not present

## 2021-02-04 ENCOUNTER — Telehealth: Payer: Self-pay | Admitting: *Deleted

## 2021-02-04 NOTE — Telephone Encounter (Signed)
Received call from pt regarding his bone marrow biopsy and his Eliquis. Advised that he should not take his Eliquis for 48 hours prior to his procedure and then to resume 24 hours after the procedure. He voiced understanding. He also asked when to expect results of this biopsy. Advised that it is usually 7-10 days. He voiced understanding.

## 2021-02-11 ENCOUNTER — Other Ambulatory Visit: Payer: Self-pay | Admitting: Hematology and Oncology

## 2021-02-11 ENCOUNTER — Telehealth: Payer: Self-pay

## 2021-02-11 ENCOUNTER — Other Ambulatory Visit: Payer: Self-pay | Admitting: Radiology

## 2021-02-11 MED ORDER — LORAZEPAM 1 MG PO TABS: 1.0000 mg | ORAL_TABLET | Freq: Once | ORAL | 0 refills | Status: AC

## 2021-02-11 NOTE — Telephone Encounter (Signed)
T/C from pt wanting to know if we could send in a rx for #1 valium for him to take before his BM bx tomorrow

## 2021-02-11 NOTE — Telephone Encounter (Signed)
Ativan 1 mg x 1 dose [facility] [address]

## 2021-02-12 ENCOUNTER — Ambulatory Visit (HOSPITAL_COMMUNITY)
Admission: RE | Admit: 2021-02-12 | Discharge: 2021-02-12 | Disposition: A | Discharge status: Home / Self Care | Payer: Medicare Other | Source: Ambulatory Visit | Attending: Hematology and Oncology | Admitting: Hematology and Oncology

## 2021-02-12 ENCOUNTER — Other Ambulatory Visit: Payer: Self-pay

## 2021-02-12 ENCOUNTER — Encounter (HOSPITAL_COMMUNITY): Payer: Self-pay

## 2021-02-12 DIAGNOSIS — Z8546 Personal history of malignant neoplasm of prostate: Secondary | ICD-10-CM | POA: Diagnosis not present

## 2021-02-12 DIAGNOSIS — D509 Iron deficiency anemia, unspecified: Secondary | ICD-10-CM | POA: Insufficient documentation

## 2021-02-12 DIAGNOSIS — M169 Osteoarthritis of hip, unspecified: Secondary | ICD-10-CM | POA: Diagnosis not present

## 2021-02-12 DIAGNOSIS — I2510 Atherosclerotic heart disease of native coronary artery without angina pectoris: Secondary | ICD-10-CM | POA: Diagnosis not present

## 2021-02-12 DIAGNOSIS — Z7901 Long term (current) use of anticoagulants: Secondary | ICD-10-CM | POA: Diagnosis not present

## 2021-02-12 DIAGNOSIS — D539 Nutritional anemia, unspecified: Secondary | ICD-10-CM | POA: Diagnosis not present

## 2021-02-12 DIAGNOSIS — I4891 Unspecified atrial fibrillation: Secondary | ICD-10-CM | POA: Diagnosis not present

## 2021-02-12 LAB — CBC WITH DIFFERENTIAL/PLATELET
Abs Immature Granulocytes: 0.01 10*3/uL (ref 0.00–0.07)
Basophils Absolute: 0 10*3/uL (ref 0.0–0.1)
Basophils Relative: 1 %
Eosinophils Absolute: 0.2 10*3/uL (ref 0.0–0.5)
Eosinophils Relative: 2 %
HCT: 33 % — ABNORMAL LOW (ref 39.0–52.0)
Hemoglobin: 11.2 g/dL — ABNORMAL LOW (ref 13.0–17.0)
Immature Granulocytes: 0 %
Lymphocytes Relative: 24 %
Lymphs Abs: 1.6 10*3/uL (ref 0.7–4.0)
MCH: 36 pg — ABNORMAL HIGH (ref 26.0–34.0)
MCHC: 33.9 g/dL (ref 30.0–36.0)
MCV: 106.1 fL — ABNORMAL HIGH (ref 80.0–100.0)
Monocytes Absolute: 0.5 10*3/uL (ref 0.1–1.0)
Monocytes Relative: 8 %
Neutro Abs: 4.4 10*3/uL (ref 1.7–7.7)
Neutrophils Relative %: 65 %
Platelets: 332 10*3/uL (ref 150–400)
RBC: 3.11 MIL/uL — ABNORMAL LOW (ref 4.22–5.81)
RDW: 15.9 % — ABNORMAL HIGH (ref 11.5–15.5)
WBC: 6.7 10*3/uL (ref 4.0–10.5)
nRBC: 0.3 % — ABNORMAL HIGH (ref 0.0–0.2)

## 2021-02-12 MED ORDER — FENTANYL CITRATE (PF) 100 MCG/2ML IJ SOLN
INTRAMUSCULAR | Status: AC | PRN
  Administered 2021-02-12 (×2): 50 ug via INTRAVENOUS

## 2021-02-12 MED ORDER — MIDAZOLAM HCL 2 MG/2ML IJ SOLN
INTRAMUSCULAR | Status: AC | PRN
  Administered 2021-02-12 (×2): 1 mg via INTRAVENOUS

## 2021-02-12 MED ORDER — FENTANYL CITRATE (PF) 100 MCG/2ML IJ SOLN
INTRAMUSCULAR | Status: AC
  Filled 2021-02-12: qty 2

## 2021-02-12 MED ORDER — SODIUM CHLORIDE 0.9 % IV SOLN: INTRAVENOUS | Status: DC

## 2021-02-12 MED ORDER — MIDAZOLAM HCL 2 MG/2ML IJ SOLN
INTRAMUSCULAR | Status: AC
  Filled 2021-02-12: qty 4

## 2021-02-12 NOTE — H&P (Signed)
Chief Complaint: Chronic microcytic anemia. Request is for bone marrow biopsy  Referring Physician(s): Dorsey,John T IV  Supervising Physician: Jacqulynn Cadet  Patient Status: Raymond Ray - Out-pt  History of Present Illness: Raymond Ray is a 70 y.o. male 70 y.o. male outpatient. History of CAD, a fib (on eliquis), prostate cancer, osteoarthritis of the left hip, long-standing microcytic anemia (since 2013). Team is concerned for myelodysplastic syndrome. Team is requesting bone marrow biopsy for further evaluation.   Currently without any significant complaints. Patient alert and laying in bed, calm and comfortable. Denies any fevers, headache, chest pain, SOB, cough, abdominal pain, nausea, vomiting or bleeding. Return precautions and treatment recommendations and follow-up discussed with the patient  who is agreeable with the plan.   Past Medical History:  Diagnosis Date   Allergy    Anemia    history of   Ascending aortic aneurysm 05/29/2020   CAD in native artery 05/29/2020   35% LAD on cath.  LDL was 74 on 04/2018.  LDL goal is less than 70.  It was 46 on 12/2019.  Continue rosuvastatin.   Carotid stenosis 05/29/2020   Chronic atrial fibrillation ([place])    Dysrhythmia 1970s   Exertional dyspnea 04/11/2020   Heart murmur    History of colonoscopy 04/2001   negative   Hypertension    Mitral valve prolapse    Nonrheumatic mitral (valve) insufficiency    Osteoarthritis of hip    Permanent atrial fibrillation (HCC)    PONV (postoperative nausea and vomiting)    left knee cartilage removal;     Prostate cancer ([place])    S/P minimally-invasive mitral valve repair 09/28/2019   Complex valvuloplasty including artificial Gore-tex neochord placement x6 with 36 mm Medtronic Sinuform annuloplasty ring via right mini thoracotomy approach   Tricuspid regurgitation    Vertigo 01/07/2017   currently not symptomatic     Past Surgical History:  Procedure Laterality Date   BUBBLE  STUDY  08/24/2019   Procedure: BUBBLE STUDY;  Surgeon: [name], MD;  Location: [facility] ENDOSCOPY;  Service: Cardiology;;   CARDIAC CATHETERIZATION  09/28/2019   CLIPPING OF ATRIAL APPENDAGE N/A 09/28/2019   Procedure: CLIPPING OF ATRIAL APPENDAGE USING ATRICURE CLIP SIZE 45MM;  Surgeon: Rexene Alberts, MD;  Location: [place];  Service: Open Heart Surgery;  Laterality: N/A;   COLONOSCOPY     KNEE SURGERY     left at age 7 in [place] Bilateral 06/02/2019   Procedure: LYMPHADENECTOMY, PELVIC;  Surgeon: [name], MD;  Location: [facility];  Service: Urology;  Laterality: Bilateral;   MINIMALLY INVASIVE TRICUSPID VALVE REPAIR Right 09/28/2019   Procedure: possible MINIMALLY INVASIVE TRICUSPID VALVE REPAIR;  Surgeon: Rexene Alberts, MD;  Location: [place];  Service: Open Heart Surgery;  Laterality: Right;   MITRAL VALVE REPAIR Right 09/28/2019   Procedure: MINIMALLY INVASIVE MITRAL VALVE REPAIR (MVR) USING SIMUFORM 36MM;  Surgeon: Rexene Alberts, MD;  Location: [place];  Service: Open Heart Surgery;  Laterality: Right;   PROSTATE BIOPSY     RIGHT/LEFT HEART CATH AND CORONARY ANGIOGRAPHY N/A 09/07/2019   Procedure: RIGHT/LEFT HEART CATH AND CORONARY ANGIOGRAPHY;  Surgeon: Sherren Mocha, MD;  Location: [place] CV LAB;  Service: Cardiovascular;  Laterality: N/A;   ROBOT ASSISTED LAPAROSCOPIC RADICAL PROSTATECTOMY N/A 06/02/2019   Procedure: XI ROBOTIC ASSISTED LAPAROSCOPIC RADICAL PROSTATECTOMY LEVEL 2;  Surgeon: [name], MD;  Location: [facility];  Service: Urology;  Laterality: N/A;   spinal injection  December 2013   TEE WITHOUT CARDIOVERSION N/A 08/24/2019   Procedure: TRANSESOPHAGEAL ECHOCARDIOGRAM (TEE);  Surgeon: [name], MD;  Location: [place];  Service: Cardiology;  Laterality: N/A;   TEE WITHOUT CARDIOVERSION N/A 09/28/2019   Procedure: TRANSESOPHAGEAL ECHOCARDIOGRAM (TEE);  Surgeon: Rexene Alberts, MD;  Location: [place];  Service: Open Heart  Surgery;  Laterality: N/A;   TOTAL HIP ARTHROPLASTY Right 01/29/2017   Procedure: RIGHT TOTAL HIP ARTHROPLASTY ANTERIOR APPROACH;  Surgeon: Rod Can, MD;  Location: [facility];  Service: Orthopedics;  Laterality: Right;   TOTAL HIP ARTHROPLASTY Left 06/20/2020   Procedure: TOTAL HIP ARTHROPLASTY ANTERIOR APPROACH;  Surgeon: Rod Can, MD;  Location: [facility];  Service: Orthopedics;  Laterality: Left;   VASECTOMY      Allergies: Patient has no known allergies.  Medications: Prior to Admission medications   Medication Sig Start Date End Date Taking? Authorizing Provider  ascorbic acid (VITAMIN C) 500 MG tablet Take 500 mg by mouth daily.    Yes [provider]  docusate sodium (COLACE) 100 MG capsule Take 1 capsule (100 mg total) by mouth 2 (two) times daily. 06/21/20  Yes Dorothyann Peng, PA  fexofenadine (ALLEGRA) 180 MG tablet Take 180 mg by mouth daily as needed for allergies. 04/30/18  Yes [provider]  LORazepam (ATIVAN) 0.5 MG tablet Take 0.5 mg by mouth once.   Yes [provider]  metoprolol tartrate (LOPRESSOR) 25 MG tablet Take 1 tablet (25 mg total) by mouth 2 (two) times daily. 04/27/20  Yes [name], MD  Multiple Vitamins-Minerals (MULTIVITAMIN WITH MINERALS) tablet Take 1 tablet by mouth daily. [facility]    Yes [provider]  oxyCODONE-acetaminophen (PERCOCET) 10-325 MG tablet Take 0.5 tablets by mouth every 8 (eight) hours as needed for pain. Pt takes half tablet   Yes [provider]  polyethylene glycol (MIRALAX / GLYCOLAX) 17 g packet Take 17 g by mouth daily.   Yes [provider]  rosuvastatin (CRESTOR) 5 MG tablet Take 5 mg by mouth daily.   Yes [provider]  senna (SENOKOT) 8.6 MG TABS tablet Take 1 tablet (8.6 mg total) by mouth 2 (two) times daily. 06/21/20  Yes Cherlynn June B, PA  zolpidem (AMBIEN) 5 MG tablet Take 5 mg by mouth at bedtime. 05/22/20  Yes [provider]  apixaban  (ELIQUIS) 5 MG TABS tablet TAKE 1 TABLET BY MOUTH TWICE A DAY 11/12/20   [name], MD  digoxin (LANOXIN) 0.125 MG tablet Take 1 tablet twice a day for 2 days and then daily Patient taking differently: Take 0.125 mg by mouth daily. 04/27/20   [name], MD  hydrocortisone cream 1 % Apply 1 application topically 2 (two) times daily as needed for itching.    [provider]  ondansetron (ZOFRAN) 4 MG tablet Take 1 tablet (4 mg total) by mouth every 6 (six) hours as needed for nausea. 06/21/20   Dorothyann Peng, PA  sildenafil (VIAGRA) 100 MG tablet Take 100 mg by mouth as needed for erectile dysfunction.    [provider]     Family History  Problem Relation Age of Onset   Arrhythmia Father        tachycardia   Cancer Mother    Heart attack Mother    Diabetes Mother    Breast cancer Sister    Breast cancer Paternal Aunt    Colon cancer Neg Hx    Esophageal cancer Neg Hx  Stomach cancer Neg Hx    Rectal cancer Neg Hx     Social History   Socioeconomic History   Marital status: Married    Spouse name: [name]   Number of children: 2   Years of education: Not on file   Highest education level: Not on file  Occupational History   Occupation: Retired  Tobacco Use   Smoking status: Never   Smokeless tobacco: Never  Vaping Use   Vaping Use: Never used  Substance and Sexual Activity   Alcohol use: No   Drug use: No   Sexual activity: Yes  Other Topics Concern   Not on file  Social History Narrative   Right handed   Lives in two story home with wife   Daughter, [name], resides in [place]. 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 Daughter, [name], resides in [place]. 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    Social Determinants of Health   Financial Resource Strain: Not on file  Food Insecurity: Not on file  Transportation Needs: Not on file  Physical Activity: Not on file  Stress: Not on file  Social Connections: Not on file    Review of Systems: A 12  point ROS discussed and pertinent positives are indicated in the HPI above.  All other systems are negative.  Review of Systems  Constitutional:  Negative for fever.  HENT:  Negative for congestion.   Respiratory:  Negative for cough and shortness of breath.   Cardiovascular:  Negative for chest pain.  Gastrointestinal:  Negative for abdominal pain.  Neurological:  Negative for headaches.  Psychiatric/Behavioral:  Negative for behavioral problems and confusion.    Vital Signs: BP (!) 114/96    Pulse 83    Temp 98.3 F (36.8 C) (Oral)    Resp 18    Ht 6' 4"  (1.93 m)    Wt 175 lb (79.4 kg)    SpO2 100%    BMI 21.30 kg/m   Physical Exam Vitals and nursing note reviewed.  Constitutional:      Appearance: He is well-developed.  HENT:     Head: Normocephalic.  Cardiovascular:     Rate and Rhythm: Normal rate and regular rhythm.     Heart sounds: Normal heart sounds.  Pulmonary:     Effort: Pulmonary effort is normal.  Musculoskeletal:        General: Normal range of motion.     Cervical back: Normal range of motion.  Skin:    General: Skin is dry.  Neurological:     Mental Status: He is alert and oriented to person, place, and time.    Imaging: No results found.  Labs:  CBC: Recent Labs    06/18/20 1053 06/21/20 0322 01/07/21 1410 02/12/21 0730  WBC 7.2 10.8* 6.8 6.7  HGB 10.9* 8.5* 11.5* 11.2*  HCT 32.9* 25.6* 34.8* 33.0*  PLT 311 264 350 332    COAGS: Recent Labs    06/18/20 1053  INR 1.2    BMP: Recent Labs    06/18/20 1053 06/21/20 0322 01/07/21 1410  NA 144 138 143  K 4.8 4.4 4.7  CL 110 110 108  CO2 26 21* 25  GLUCOSE 99 207* 91  BUN 22 26* 24*  CALCIUM 9.4 8.2* 9.0  CREATININE 1.03 0.99 1.24  GFRNONAA >60 >60 >60    LIVER FUNCTION TESTS: Recent Labs    06/18/20 1053 01/07/21 1410  BILITOT 1.1 1.5*  AST 15 23  ALT 12 13  ALKPHOS 53  67  PROT 6.8 6.5  ALBUMIN 4.0 4.5     Assessment and Plan:  70 y.o. male outpatient. History  of CAD, a fib (on eliquis), prostate cancer, osteoarthritis of the left hip, long-standing microcytic anemia (since 2013). Team is concerned for myelodysplastic syndrome. Team is requesting bone marrow biopsy for further evaluation.   Patient is on eliquis which he has held 2 days per his MD orders. All other labs and medications are within acceptable parameters. NKDA. Patient has  been NPO since midnight.   Risks and benefits of bone marrow biopsy was discussed with the patient and/or patient's family including, but not limited to bleeding, infection, damage to adjacent structures or low yield requiring additional tests.  All of the questions were answered and there is agreement to proceed.  Consent signed and in chart.    Thank you for this interesting consult.  I greatly enjoyed meeting Raymond Ray and look forward to participating in their care.  A copy of this report was sent to the requesting provider on this date.  Electronically Signed: Jacqualine Mau, NP 02/12/2021, 8:06 AM   I spent a total of  30 Minutes   in face to face in clinical consultation, greater than 50% of which was counseling/coordinating care for bone marrow biopsy

## 2021-02-12 NOTE — Discharge Instructions (Signed)
Please call Interventional Radiology clinic [phone] with any questions or concerns. ° °You may remove your dressing and shower tomorrow. ° ° °Bone Marrow Aspiration and Bone Marrow Biopsy, Adult, Care After °This sheet gives you information about how to care for yourself after your procedure. Your health care provider may also give you more specific instructions. If you have problems or questions, contact your health care provider. °What can I expect after the procedure? °After the procedure, it is common to have: °Mild pain and tenderness. °Swelling. °Bruising. °Follow these instructions at home: °Puncture site care °Follow instructions from your health care provider about how to take care of the puncture site. Make sure you: °Wash your hands with soap and water before and after you change your bandage (dressing). If soap and water are not available, use hand sanitizer. °Change your dressing as told by your health care provider. °Check your puncture site every day for signs of infection. Check for: °More redness, swelling, or pain. °Fluid or blood. °Warmth. °Pus or a bad smell.   °Activity °Return to your normal activities as told by your health care provider. Ask your health care provider what activities are safe for you. °Do not lift anything that is heavier than 10 lb (4.5 kg), or the limit that you are told, until your health care provider says that it is safe. °Do not drive for 24 hours if you were given a sedative during your procedure. °General instructions °Take over-the-counter and prescription medicines only as told by your health care provider. °Do not take baths, swim, or use a hot tub until your health care provider approves. Ask your health care provider if you may take showers. You may only be allowed to take sponge baths. °If directed, put ice on the affected area. To do this: °Put ice in a plastic bag. °Place a towel between your skin and the bag. °Leave the ice on for 20 minutes, 2-3 times a  day. °Keep all follow-up visits as told by your health care provider. This is important.   °Contact a health care provider if: °Your pain is not controlled with medicine. °You have a fever. °You have more redness, swelling, or pain around the puncture site. °You have fluid or blood coming from the puncture site. °Your puncture site feels warm to the touch. °You have pus or a bad smell coming from the puncture site. °Summary °After the procedure, it is common to have mild pain, tenderness, swelling, and bruising. °Follow instructions from your health care provider about how to take care of the puncture site and what activities are safe for you. °Take over-the-counter and prescription medicines only as told by your health care provider. °Contact a health care provider if you have any signs of infection, such as fluid or blood coming from the puncture site. °This information is not intended to replace advice given to you by your health care provider. Make sure you discuss any questions you have with your health care provider. °Document Revised: 06/29/2018 Document Reviewed: 06/29/2018 °Elsevier Patient Education © 2021 [employer] ° ° °Moderate Conscious Sedation, Adult, Care After °This sheet gives you information about how to care for yourself after your procedure. Your health care provider may also give you more specific instructions. If you have problems or questions, contact your health care provider. °What can I expect after the procedure? °After the procedure, it is common to have: °Sleepiness for several hours. °Impaired judgment for several hours. °Difficulty with balance. °Vomiting if you eat too   soon. °Follow these instructions at home: °For the time period you were told by your health care provider: °Rest. °Do not participate in activities where you could fall or become injured. °Do not drive or use machinery. °Do not drink alcohol. °Do not take sleeping pills or medicines that cause drowsiness. °Do not  make important decisions or sign legal documents. °Do not take care of children on your own.  °  °  °Eating and drinking °Follow the diet recommended by your health care provider. °Drink enough fluid to keep your urine pale yellow. °If you vomit: °Drink water, juice, or soup when you can drink without vomiting. °Make sure you have little or no nausea before eating solid foods.   °General instructions °Take over-the-counter and prescription medicines only as told by your health care provider. °Have a responsible adult stay with you for the time you are told. It is important to have someone help care for you until you are awake and alert. °Do not smoke. °Keep all follow-up visits as told by your health care provider. This is important. °Contact a health care provider if: °You are still sleepy or having trouble with balance after 24 hours. °You feel light-headed. °You keep feeling nauseous or you keep vomiting. °You develop a rash. °You have a fever. °You have redness or swelling around the IV site. °Get help right away if: °You have trouble breathing. °You have new-onset confusion at home. °Summary °After the procedure, it is common to feel sleepy, have impaired judgment, or feel nauseous if you eat too soon. °Rest after you get home. Know the things you should not do after the procedure. °Follow the diet recommended by your health care provider and drink enough fluid to keep your urine pale yellow. °Get help right away if you have trouble breathing or new-onset confusion at home. °This information is not intended to replace advice given to you by your health care provider. Make sure you discuss any questions you have with your health care provider. °Document Revised: 06/10/2019 Document Reviewed: 01/06/2019 °Elsevier Patient Education © 2021 Elsevier Inc.  °

## 2021-02-12 NOTE — Procedures (Signed)
Interventional Radiology Procedure Note  Procedure: CT guided aspirate and core biopsy of right iliac bone Complications: None Recommendations: - Bedrest supine x 1 hrs - Hydrocodone PRN  Pain - Follow biopsy results  Signed,  Vicie Cech K. Rumi Kolodziej, MD   

## 2021-02-14 ENCOUNTER — Telehealth: Payer: Self-pay | Admitting: Hematology and Oncology

## 2021-02-14 NOTE — Telephone Encounter (Signed)
Called Mr. Raymond Ray to discuss the results of his bone marrow biopsy.  His wife was present for the call.  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  The patient has had macrocytosis and stable anemia for almost 10 years at this point.  Given these findings I would recommend continued observation for his myelodysplastic syndrome.  There is no clear indication for treatment at this time though we could consider erythropoietin stimulating agents when the time comes. Noted to have no detectable blasts.  We will plan to see him back in 3 months time and if everything is stable continue every 6 month follow-up in order to monitor his condition.  The patient and his wife voiced understanding of the diagnosis and the plan moving forward. ° °John T. Dorsey, MD °Department of Hematology/Oncology °[facility] °Phone: [phone] °Pager: [phone] °Email: [email or web address] ° °

## 2021-02-19 ENCOUNTER — Encounter (HOSPITAL_COMMUNITY): Payer: Self-pay | Admitting: Hematology and Oncology

## 2021-02-21 ENCOUNTER — Encounter (HOSPITAL_COMMUNITY): Payer: Self-pay | Admitting: Hematology and Oncology

## 2021-02-22 ENCOUNTER — Encounter (HOSPITAL_COMMUNITY): Payer: Self-pay | Admitting: Hematology and Oncology

## 2021-02-26 DIAGNOSIS — Z96642 Presence of left artificial hip joint: Secondary | ICD-10-CM | POA: Diagnosis not present

## 2021-03-13 LAB — SURGICAL PATHOLOGY

## 2021-04-01 ENCOUNTER — Encounter: Payer: Self-pay | Admitting: Hematology and Oncology

## 2021-04-02 ENCOUNTER — Other Ambulatory Visit: Payer: Self-pay | Admitting: Hematology and Oncology

## 2021-04-02 DIAGNOSIS — C61 Malignant neoplasm of prostate: Secondary | ICD-10-CM

## 2021-04-02 DIAGNOSIS — D539 Nutritional anemia, unspecified: Secondary | ICD-10-CM

## 2021-04-05 ENCOUNTER — Other Ambulatory Visit: Payer: Self-pay

## 2021-04-05 ENCOUNTER — Encounter (HOSPITAL_BASED_OUTPATIENT_CLINIC_OR_DEPARTMENT_OTHER): Payer: Self-pay | Admitting: Cardiovascular Disease

## 2021-04-05 ENCOUNTER — Ambulatory Visit (INDEPENDENT_AMBULATORY_CARE_PROVIDER_SITE_OTHER): Payer: Medicare Other | Admitting: Cardiovascular Disease

## 2021-04-05 VITALS — BP 98/72 | HR 76 | Ht 75.0 in | Wt 174.4 lb

## 2021-04-05 DIAGNOSIS — Z79899 Other long term (current) drug therapy: Secondary | ICD-10-CM | POA: Diagnosis not present

## 2021-04-05 DIAGNOSIS — I4821 Permanent atrial fibrillation: Secondary | ICD-10-CM | POA: Diagnosis not present

## 2021-04-05 DIAGNOSIS — I7121 Aneurysm of the ascending aorta, without rupture: Secondary | ICD-10-CM

## 2021-04-05 DIAGNOSIS — Z5181 Encounter for therapeutic drug level monitoring: Secondary | ICD-10-CM

## 2021-04-05 DIAGNOSIS — Z9889 Other specified postprocedural states: Secondary | ICD-10-CM | POA: Diagnosis not present

## 2021-04-05 DIAGNOSIS — I2510 Atherosclerotic heart disease of native coronary artery without angina pectoris: Secondary | ICD-10-CM | POA: Diagnosis not present

## 2021-04-05 MED ORDER — APIXABAN 5 MG PO TABS: 5.0000 mg | ORAL_TABLET | Freq: Two times a day (BID) | ORAL | 3 refills | Status: DC

## 2021-04-05 NOTE — Assessment & Plan Note (Signed)
He has minimal LAD plaque.  He has not had any ischemic symptoms.  Lipids are very well controlled.  Continue rosuvastatin and metoprolol.

## 2021-04-05 NOTE — Assessment & Plan Note (Signed)
Mild ascending aortic aneurysm.  4.1 cm on echo in 2021.  We will repeat the study.  BP is well-controlled an dhe is on metoprolol.

## 2021-04-05 NOTE — Patient Instructions (Signed)
Medication Instructions:  Your physician recommends that you continue on your current medications as directed. Please refer to the Current Medication list given to you today.   *If you need a refill on your cardiac medications before your next appointment, please call your pharmacy*  Lab Work: [address]   If you have labs (blood work) drawn today and your tests are completely normal, you will receive your results only by: [place] (if you have MyChart) OR A paper copy in the mail If you have any lab test that is abnormal or we need to change your treatment, we will call you to review the results.  Testing/Procedures: Your physician has requested that you have an echocardiogram. Echocardiography is a painless test that uses sound waves to create images of your heart. It provides your doctor with information about the size and shape of your heart and how well your hearts chambers and valves are working. This procedure takes approximately one hour. There are no restrictions for this procedure.   Follow-Up: At [facility], you and your health needs are our priority.  As part of our continuing mission to provide you with exceptional heart care, we have created designated Provider Care Teams.  These Care Teams include your primary Cardiologist (physician) and Advanced Practice Providers (APPs -  Physician Assistants and Nurse Practitioners) who all work together to provide you with the care you need, when you need it.  We recommend signing up for the patient portal called "MyChart".  Sign up information is provided on this After Visit Summary.  MyChart is used to connect with patients for Virtual Visits (Telemedicine).  Patients are able to view lab/test results, encounter notes, upcoming appointments, etc.  Non-urgent messages can be sent to your provider as well.   To learn more about what you can do with MyChart, go to [email or web address].    Your next appointment:   12  month(s)  The format for your next appointment:   In Person  Provider:   [name], MD{

## 2021-04-05 NOTE — Assessment & Plan Note (Signed)
He remains in rate controlled atrial fibrillation.  Continue digoxin, metoprolol, and Eliquis.  Check digoxin level today.

## 2021-04-05 NOTE — Assessment & Plan Note (Signed)
He is doing well.  No heart failure symptoms.  We will get a surveillance echo.

## 2021-04-05 NOTE — Progress Notes (Addendum)
Cardiology Office Note  Morning Date:  12/30/2021   ID:  Raymond Ray, DOB 1950/10/12, MRN [id number]  PCP:  Crist Infante, MD  Cardiologist:  [name], MD  Electrophysiologist:  None   Evaluation Performed:  Follow-Up Visit  Chief Complaint:  Atrial fibrillation  History of Present Illness:    Raymond Ray is a 71 y.o. male with chronic atrial fibrillation (s/p LAA clipping) and mitral regurgitation 2/2 MVP s/p mitral valve repair, mild carotid stenosis, mild CAD, who presents for follow up.  Raymond Ray was previously a patient of Dr. Mare Ferrari.  He first developed atrial fibrillation in 1986.  He was asymptomatic and it was discovered on physical exam.  He was initially treated with amiodarone and this was discontinued due to concern for long-term toxicity.  He was then on quinidine and this was transitioned to digoxin when he moved from [place] to the [place]. Raymond Ray had an echo 10/09/09 that showed normal systolic function and posterior mitral valve prolapse with moderate mitral regurgitation and normal PA pressures.  He had a nuclear stress 04/29/02 showing no ischemia and an ejection fraction of 55%.  He developed epistaxis on Xarelto and switched back to Coumadin with an INR goal of 1.8-2.2.  However,  He switch to Eliquis with no overt bleeding.  He was noted to have a low iron levels and anemia.  He was started on iron supplementation and has continued on Eliquis.  He had a colonoscopy 08/2018 that revealed diverticuli and a 3 mm polyp that was removed.  At his last appointment he reported some mild exertional dyspnea.  He had a repeat echo 07/2019 that revealed LVEF 50 to 55%.  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  He underwent minimally invasive mitral valve repair with Dr. Roxy Manns on 09/2019.  He had a 36 mm Medtronic Sinuform Ring and the left atrial appendage was clipped.  He was switched back to warfarin for valvular atrial  fibrillation.  Raymond Ray had fast heart rate with exercise. He wore a monitor 03/28/20 that showed his heart rate increase to 201 with up to 4 second pauses at night. He had an ETT on 04/27/20 with poorly control heart rate with 1 mm ST depression.  Metoprolol was reduced and digoxin was restarted.  He underwent hip replacement on 05/2020.  He underwent bone marrow biopsy 01/2021 for longstanding microcytic anemia.  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  Given that he had been stable for >10 years they recommended continued observation.  Lately he has been feeling well.  He has been traveling with family.  He continues to exercise on the elliptical.  He has not been able to get back up to as intensive and exercise as he was doing prior to his valve replacement.  However he has seen improvement.  He has no exertional dyspnea or chest pain.  He thinks his blood pressure is lower today than usual.  He denies any lightheaded or dizziness with the exception of 1 episode.  He denies any syncope.  He has not had any lower extremity edema, orthopnea, or PND.  He continues to be asymptomatic from his atrial fibrillation.   Past Medical History:  Diagnosis Date   Allergy    Anemia    history of   Ascending aortic aneurysm ([place]) 05/29/2020   CAD in native artery 05/29/2020   35% LAD on cath.  LDL was 74 on 04/2018.  LDL  goal is less than 70.  It was 46 on 12/2019.  Continue rosuvastatin.   Carotid stenosis 05/29/2020   Chronic atrial fibrillation ([place])    Dysrhythmia 1970s   Exertional dyspnea 04/11/2020   Heart murmur    History of colonoscopy 04/2001   negative   Hypertension    Mitral valve prolapse    Nonrheumatic mitral (valve) insufficiency    Osteoarthritis of hip    Permanent atrial fibrillation (HCC)    PONV (postoperative nausea and vomiting)    left knee cartilage removal;     Prostate cancer ([place])    S/P minimally-invasive mitral valve repair 09/28/2019   Complex  valvuloplasty including artificial Gore-tex neochord placement x6 with 36 mm Medtronic Sinuform annuloplasty ring via right mini thoracotomy approach   Tricuspid regurgitation    Vertigo 01/07/2017   currently not symptomatic    Past Surgical History:  Procedure Laterality Date   BUBBLE STUDY  08/24/2019   Procedure: BUBBLE STUDY;  Surgeon: [name], MD;  Location: [facility] ENDOSCOPY;  Service: Cardiology;;   CARDIAC CATHETERIZATION  09/28/2019   CLIPPING OF ATRIAL APPENDAGE N/A 09/28/2019   Procedure: CLIPPING OF ATRIAL APPENDAGE USING ATRICURE CLIP SIZE 45MM;  Surgeon: Rexene Alberts, MD;  Location: [place];  Service: Open Heart Surgery;  Laterality: N/A;   COLONOSCOPY     KNEE SURGERY     left at age 47 in [place] Bilateral 06/02/2019   Procedure: LYMPHADENECTOMY, PELVIC;  Surgeon: [name], MD;  Location: [facility];  Service: Urology;  Laterality: Bilateral;   MINIMALLY INVASIVE TRICUSPID VALVE REPAIR Right 09/28/2019   Procedure: possible MINIMALLY INVASIVE TRICUSPID VALVE REPAIR;  Surgeon: Rexene Alberts, MD;  Location: [place];  Service: Open Heart Surgery;  Laterality: Right;   MITRAL VALVE REPAIR Right 09/28/2019   Procedure: MINIMALLY INVASIVE MITRAL VALVE REPAIR (MVR) USING SIMUFORM 36MM;  Surgeon: Rexene Alberts, MD;  Location: [place];  Service: Open Heart Surgery;  Laterality: Right;   PROSTATE BIOPSY     RIGHT/LEFT HEART CATH AND CORONARY ANGIOGRAPHY N/A 09/07/2019   Procedure: RIGHT/LEFT HEART CATH AND CORONARY ANGIOGRAPHY;  Surgeon: Sherren Mocha, MD;  Location: [place] CV LAB;  Service: Cardiovascular;  Laterality: N/A;   ROBOT ASSISTED LAPAROSCOPIC RADICAL PROSTATECTOMY N/A 06/02/2019   Procedure: XI ROBOTIC ASSISTED LAPAROSCOPIC RADICAL PROSTATECTOMY LEVEL 2;  Surgeon: [name], MD;  Location: [facility];  Service: Urology;  Laterality: N/A;   spinal injection     December 2013   TEE WITHOUT CARDIOVERSION N/A 08/24/2019   Procedure:  TRANSESOPHAGEAL ECHOCARDIOGRAM (TEE);  Surgeon: [name], MD;  Location: [place];  Service: Cardiology;  Laterality: N/A;   TEE WITHOUT CARDIOVERSION N/A 09/28/2019   Procedure: TRANSESOPHAGEAL ECHOCARDIOGRAM (TEE);  Surgeon: Rexene Alberts, MD;  Location: [place];  Service: Open Heart Surgery;  Laterality: N/A;   TOTAL HIP ARTHROPLASTY Right 01/29/2017   Procedure: RIGHT TOTAL HIP ARTHROPLASTY ANTERIOR APPROACH;  Surgeon: Rod Can, MD;  Location: [facility];  Service: Orthopedics;  Laterality: Right;   TOTAL HIP ARTHROPLASTY Left 06/20/2020   Procedure: TOTAL HIP ARTHROPLASTY ANTERIOR APPROACH;  Surgeon: Rod Can, MD;  Location: [facility];  Service: Orthopedics;  Laterality: Left;   VASECTOMY       Current Meds  Medication Sig   fexofenadine (ALLEGRA) 180 MG tablet Take 180 mg by mouth daily as needed for allergies.   hydrocortisone cream 1 % Apply 1 application topically 2 (two) times daily as needed for itching.  Multiple Vitamins-Minerals (MULTIVITAMIN WITH MINERALS) tablet Take 1 tablet by mouth daily. [facility] men    polyethylene glycol (MIRALAX / GLYCOLAX) 17 g packet Take 17 g by mouth daily.   rosuvastatin (CRESTOR) 5 MG tablet Take 5 mg by mouth daily.   sildenafil (VIAGRA) 100 MG tablet Take 100 mg by mouth as needed for erectile dysfunction.   zolpidem (AMBIEN) 5 MG tablet Take 5 mg by mouth at bedtime.   [DISCONTINUED] apixaban (ELIQUIS) 5 MG TABS tablet TAKE 1 TABLET BY MOUTH TWICE A DAY   [DISCONTINUED] ascorbic acid (VITAMIN C) 500 MG tablet Take 500 mg by mouth daily.    [DISCONTINUED] digoxin (LANOXIN) 0.125 MG tablet Take 1 tablet twice a day for 2 days and then daily (Patient taking differently: Take 0.125 mg by mouth daily.)   [DISCONTINUED] LORazepam (ATIVAN) 0.5 MG tablet Take 0.5 mg by mouth once.   [DISCONTINUED] metoprolol tartrate (LOPRESSOR) 25 MG tablet Take 1 tablet (25 mg total) by mouth 2 (two) times daily.   [DISCONTINUED] oxyCODONE-acetaminophen  (PERCOCET) 10-325 MG tablet Take 0.5 tablets by mouth every 8 (eight) hours as needed for pain. Pt takes half tablet     Allergies:   Patient has no known allergies.   Social History   Tobacco Use   Smoking status: Never   Smokeless tobacco: Never  Vaping Use   Vaping Use: Never used  Substance Use Topics   Alcohol use: No   Drug use: No     Family Hx: The patient's family history includes Arrhythmia in his father; Breast cancer in his paternal aunt and sister; Cancer in his mother; Diabetes in his mother; Heart attack in his mother. There is no history of Colon cancer, Esophageal cancer, Stomach cancer, or Rectal cancer.  ROS:   Please see the history of present illness.  All other systems reviewed and are negative.   Prior CV studies:   The following studies were reviewed today:  Echo 09/2019:  1. Normal LV function; s/p MV repair with mild MS (mean gradient 5 mm Hg;  MVA 2.1 cm2) and no MR; moderate biatrial enlargement.   2. Left ventricular ejection fraction, by estimation, is 50 to 55%. The  left ventricle has low normal function. The left ventricle has no regional  wall motion abnormalities.   3. Right ventricular systolic function is normal. The right ventricular  size is normal.   4. Left atrial size was moderately dilated.   5. Right atrial size was moderately dilated.   6. The mitral valve has been repaired/replaced. Trivial mitral valve  regurgitation. Mild mitral stenosis.   7. The aortic valve is tricuspid. Aortic valve regurgitation is not  visualized. No aortic stenosis is present.   8. The inferior vena cava is dilated in size with >50% respiratory  variability, suggesting right atrial pressure of 8 mmHg.   Echo 07/2019: 1. Left ventricular ejection fraction, by estimation, is 50 to 55%. The  left ventricle has low normal function. The left ventricle has no regional  wall motion abnormalities. Left ventricular diastolic function could not  be evaluated.    2. Right ventricular systolic function is normal. The right ventricular  size is normal. There is moderately elevated pulmonary artery systolic  pressure.   3. Left atrial size was severely dilated.   4. Right atrial size was severely dilated.   5. The mitral valve is myxomatous. Moderate to severe mitral valve  regurgitation.   6. Tricuspid valve regurgitation is mild to moderate.  7. The aortic valve is normal in structure. Aortic valve regurgitation is  not visualized. No aortic stenosis is present.   8. Aortic dilatation noted. 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   LHC 08/2019: 1.  Calcified nonobstructive proximal LAD stenosis 2.  Patent coronary arteries with mild diffuse irregularity and no flow-limiting coronary stenoses 3.  Normal right heart filling pressures with preserved cardiac output 4.  20 mm V wave consistent with the patient's known mitral regurgitation   Recommendation: Continued plans for cardiac surgical evaluation for mitral valve repair  Carotid Dopplers 09/2019: 1 to 39% ICA stenosis bilaterally.    Labs/Other Tests and Data Reviewed:    EKG:  05/29/20-Atrial Fibrillation. Rate 79 bpm.   An ECG dated 10/14/2019 was personally reviewed today and demonstrated:  Atrial fibrillation.  Rate 80 bpm.   04/11/2020: Atrial fibrillation.  Rate 88 bpm. 04/05/21: Atrial fibrillation.  Rate 76 bpm  Recent Labs: 09/09/2021: Magnesium 2.2 10/07/2021: ALT 12; BUN 20; Creatinine 1.12; Potassium 4.4; Sodium 139 10/29/2021: Hemoglobin 11.3; Platelet Count 265   Recent Lipid Panel Lab Results  Component Value Date/Time   CHOL 166 03/03/2011 09:22 AM   TRIG 41.0 03/03/2011 09:22 AM   HDL 55.80 03/03/2011 09:22 AM   CHOLHDL 3 03/03/2011 09:22 AM   LDLCALC 102 (H) 03/03/2011 09:22 AM    04/30/2018: Total cholesterol 132, triglycerides 64, HDL 54, LDL 73 Sodium 140, BUN 28, creatinine 1.3 AST 18, ALT 15 TSH 1.37  07/08/2018: Hemoglobin  12.2  04/03/20 14 Day Event Monitor   Quality: Fair.  Baseline artifact. Predominant rhythm: atrial fibrillation Average heart rate: 86 bpm Max heart rate: 201 bpm Min heart rate: 36 bpm Pauses >2.5 seconds: Pauses up to 4.3 seconds.  Pauses noted overnight/early morning.   Up to 13 beats NSVT.  Max rate 250 bpm.  04/27/20 Blood pressure demonstrated a normal response to exercise. Horizontal ST segment depression ST segment depression of 1 mm was noted during stress in the III, II, aVF, V6 and V5 leads, beginning at 4 minutes of stress, and returning to baseline after 1-5 minutes of recovery. Abnormal exercise stress test. Exaggerated heart rate response due to atrial fibrillation. There is borderline ST segment depression during exercise that resolves fairly rapidly in recovery. Although "digoxin effect" is not seen on the baseline tracing, cannot exclude "false positive response" due to digoxin.   Wt Readings from Last 3 Encounters:  09/09/21 173 lb 3.2 oz (78.6 kg)  08/14/21 175 lb 3.2 oz (79.5 kg)  04/08/21 174 lb 6.4 oz (79.1 kg)     Objective:    VS:  BP 98/72 (BP Location: Right Arm, Patient Position: Sitting, Cuff Size: Normal)   Pulse 76   Ht _0  (1.905 m)   Wt 174 lb 6.4 oz (79.1 kg)   BMI 21.80 kg/m  , BMI Body mass index is 21.8 kg/m. GENERAL:  Well appearing HEENT: Pupils equal round and reactive, fundi not visualized, oral mucosa unremarkable NECK:  No jugular venous distention, waveform within normal limits, carotid upstroke brisk and symmetric, no bruits LUNGS:  Clear to auscultation bilaterally HEART: Irregularly irregular PMI not displaced or sustained,S1 and S2 within normal limits, no S3, no S4, no clicks, no rubs, no murmurs ABD:  Flat, positive bowel sounds normal in frequency in pitch, no bruits, no rebound, no guarding, no midline pulsatile mass, no hepatomegaly, no splenomegaly EXT:  2 plus pulses throughout, no edema, no cyanosis no  clubbing  SKIN:  No rashes no nodules NEURO:  Cranial nerves II through XII grossly intact, motor grossly intact throughout PSYCH:  Cognitively intact, oriented to person place and time  ASSESSMENT & PLAN:    CAD in native artery He has minimal LAD plaque.  He has not had any ischemic symptoms.  Lipids are very well controlled.  Continue rosuvastatin and metoprolol.  Permanent atrial fibrillation ([place]) He remains in rate controlled atrial fibrillation.  Continue digoxin, metoprolol, and Eliquis.  Check digoxin level today.  S/P minimally-invasive mitral valve repair He is doing well.  No heart failure symptoms.  We will get a surveillance echo.   Ascending aortic aneurysm ([facility]CC) Mild ascending aortic aneurysm.  4.1 cm on echo in 2021.  We will repeat the study.  BP is well-controlled an dhe is on metoprolol.     Medication Adjustments/Labs and Tests Ordered: Current medicines are reviewed at length with the patient today.  Concerns regarding medicines are outlined above.   Tests Ordered: Orders Placed This Encounter  Procedures   Digoxin level   EKG 12-Lead   ECHOCARDIOGRAM COMPLETE    Medication Changes: Meds ordered this encounter  Medications   apixaban (ELIQUIS) 5 MG TABS tablet    Sig: Take 1 tablet (5 mg total) by mouth 2 (two) times daily.    Dispense:  180 tablet    Refill:  3    Disposition:  Follow up in 6 months  I,Alexis Bryant,acting as a scribe for [name], MD.,have documented all relevant documentation on the behalf of [name], MD,as directed by  [name], MD while in the presence of [name], MD.  Signed, [name], MD  12/30/2021 8:08 AM    [place]

## 2021-04-06 LAB — DIGOXIN LEVEL: Digoxin, Serum: 0.5 ng/mL (ref 0.5–0.9)

## 2021-04-07 NOTE — Progress Notes (Signed)
[place] [phone]   [phone]  PROGRESS NOTE  Patient Care Team: Crist Infante, MD as PCP - General (Internal Medicine) [name], MD as PCP - Cardiology (Cardiology) Alda Berthold, DO as Consulting Physician (Neurology) Cira Rue, RN Nurse Navigator as Registered Nurse (Medical Oncology)  Hematological/Oncological History # Macrocytic Anemia # Low Grade Myelodysplastic Syndrome  03/03/2011: WBC 4.1, Hgb 12.7, MCV 104.6, Plt 203 01/30/2017: WBC 12.1, Hgb 9.2, MCV 100.7, Plt 232 09/28/2019: WBC 24, Hgb 9.3, MCV 107.9, Plt 189 06/21/2020: WBC 10.8, Hgb 8.5, MCV 107.1, Plt 264 01/07/2021: establish care with Dr. Lorenso Courier     02/12/2021: Bmbx showed a hypercellular marrow with dyspoiesis and ring sideroblasts  Interval History:  Raymond Ray 71 y.o. male with medical history significant for low-grade myelodysplastic syndrome who presents for a follow up visit. The patient's last visit was on 01/07/2021. In the interim since the last visit underwent a bone marrow biopsy on 02/12/2021 which confirmed the diagnosis of low-grade myelodysplastic syndrome.  On exam today Raymond Ray notes that he has not undergone any new changes in his health in the interim since her last visit.  He notes his energy levels are about the same and he has not been having any issues with bleeding, bruising, or dark stools.  He is also had no issues with recurrent infections or other major changes in his health.  He notes that he does tend to have lower energy in the afternoon and slows down a bit by that time of the day.  Otherwise he feels well and has no questions concerns or complaints.  Full 10 point ROS is listed below.  MEDICAL HISTORY:  Past Medical History:  Diagnosis Date   Allergy    Anemia    history of   Ascending aortic aneurysm 05/29/2020   CAD in native artery 05/29/2020   35% LAD on cath.  LDL was 74 on 04/2018.  LDL goal is less than 70.  It was 46  on 12/2019.  Continue rosuvastatin.   Carotid stenosis 05/29/2020   Chronic atrial fibrillation ([place])    Dysrhythmia 1970s   Exertional dyspnea 04/11/2020   Heart murmur    History of colonoscopy 04/2001   negative   Hypertension    Mitral valve prolapse    Nonrheumatic mitral (valve) insufficiency    Osteoarthritis of hip    Permanent atrial fibrillation (HCC)    PONV (postoperative nausea and vomiting)    left knee cartilage removal;     Prostate cancer ([place])    S/P minimally-invasive mitral valve repair 09/28/2019   Complex valvuloplasty including artificial Gore-tex neochord placement x6 with 36 mm Medtronic Sinuform annuloplasty ring via right mini thoracotomy approach   Tricuspid regurgitation    Vertigo 01/07/2017   currently not symptomatic     SURGICAL HISTORY: Past Surgical History:  Procedure Laterality Date   BUBBLE STUDY  08/24/2019   Procedure: BUBBLE STUDY;  Surgeon: [name], MD;  Location: [facility] ENDOSCOPY;  Service: Cardiology;;   CARDIAC CATHETERIZATION  09/28/2019   CLIPPING OF ATRIAL APPENDAGE N/A 09/28/2019   Procedure: CLIPPING OF ATRIAL APPENDAGE USING ATRICURE CLIP SIZE 45MM;  Surgeon: Rexene Alberts, MD;  Location: [place];  Service: Open Heart Surgery;  Laterality: N/A;   COLONOSCOPY     KNEE SURGERY     left at age 23 in [place] Bilateral 06/02/2019   Procedure: LYMPHADENECTOMY, PELVIC;  Surgeon: [name], MD;  Location: [facility];  Service: Urology;  Laterality: Bilateral;   MINIMALLY INVASIVE TRICUSPID VALVE REPAIR Right 09/28/2019   Procedure: possible MINIMALLY INVASIVE TRICUSPID VALVE REPAIR;  Surgeon: Rexene Alberts, MD;  Location: [place];  Service: Open Heart Surgery;  Laterality: Right;   MITRAL VALVE REPAIR Right 09/28/2019   Procedure: MINIMALLY INVASIVE MITRAL VALVE REPAIR (MVR) USING SIMUFORM 36MM;  Surgeon: Rexene Alberts, MD;  Location: [place];  Service: Open Heart Surgery;  Laterality: Right;   PROSTATE BIOPSY      RIGHT/LEFT HEART CATH AND CORONARY ANGIOGRAPHY N/A 09/07/2019   Procedure: RIGHT/LEFT HEART CATH AND CORONARY ANGIOGRAPHY;  Surgeon: Sherren Mocha, MD;  Location: [place] CV LAB;  Service: Cardiovascular;  Laterality: N/A;   ROBOT ASSISTED LAPAROSCOPIC RADICAL PROSTATECTOMY N/A 06/02/2019   Procedure: XI ROBOTIC ASSISTED LAPAROSCOPIC RADICAL PROSTATECTOMY LEVEL 2;  Surgeon: [name], MD;  Location: [facility];  Service: Urology;  Laterality: N/A;   spinal injection     December 2013   TEE WITHOUT CARDIOVERSION N/A 08/24/2019   Procedure: TRANSESOPHAGEAL ECHOCARDIOGRAM (TEE);  Surgeon: [name], MD;  Location: [place];  Service: Cardiology;  Laterality: N/A;   TEE WITHOUT CARDIOVERSION N/A 09/28/2019   Procedure: TRANSESOPHAGEAL ECHOCARDIOGRAM (TEE);  Surgeon: Rexene Alberts, MD;  Location: [place];  Service: Open Heart Surgery;  Laterality: N/A;   TOTAL HIP ARTHROPLASTY Right 01/29/2017   Procedure: RIGHT TOTAL HIP ARTHROPLASTY ANTERIOR APPROACH;  Surgeon: Rod Can, MD;  Location: [facility];  Service: Orthopedics;  Laterality: Right;   TOTAL HIP ARTHROPLASTY Left 06/20/2020   Procedure: TOTAL HIP ARTHROPLASTY ANTERIOR APPROACH;  Surgeon: Rod Can, MD;  Location: [facility];  Service: Orthopedics;  Laterality: Left;   VASECTOMY      SOCIAL HISTORY: Social History   Socioeconomic History   Marital status: Married    Spouse name: [name]   Number of children: 2   Years of education: Not on file   Highest education level: Not on file  Occupational History   Occupation: Retired  Tobacco Use   Smoking status: Never   Smokeless tobacco: Never  Vaping Use   Vaping Use: Never used  Substance and Sexual Activity   Alcohol use: No   Drug use: No   Sexual activity: Yes  Other Topics Concern   Not on file  Social History Narrative   Right handed   Lives in two story home with wife   Daughter, [name], resides in [place]. 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 Daughter,  [name], resides in [place]. 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    Social Determinants of Health   Financial Resource Strain: Not on file  Food Insecurity: Not on file  Transportation Needs: Not on file  Physical Activity: Not on file  Stress: Not on file  Social Connections: Not on file  Intimate Partner Violence: Not on file    FAMILY HISTORY: Family History  Problem Relation Age of Onset   Arrhythmia Father        tachycardia   Cancer Mother    Heart attack Mother    Diabetes Mother    Breast cancer Sister    Breast cancer Paternal Aunt    Colon cancer Neg Hx    Esophageal cancer Neg Hx    Stomach cancer Neg Hx    Rectal cancer Neg Hx     ALLERGIES:  has No Known Allergies.  MEDICATIONS:  Current Outpatient Medications  Medication Sig Dispense Refill   apixaban (ELIQUIS) 5 MG TABS tablet Take  1 tablet (5 mg total) by mouth 2 (two) times daily. 180 tablet 3   ascorbic acid (VITAMIN C) 500 MG tablet Take 500 mg by mouth daily.      digoxin (LANOXIN) 0.125 MG tablet Take 1 tablet (0.125 mg total) by mouth daily. 90 tablet 2   fexofenadine (ALLEGRA) 180 MG tablet Take 180 mg by mouth daily as needed for allergies.     hydrocortisone cream 1 % Apply 1 application topically 2 (two) times daily as needed for itching.     LORazepam (ATIVAN) 0.5 MG tablet Take 0.5 mg by mouth once.     metoprolol tartrate (LOPRESSOR) 25 MG tablet Take 1 tablet (25 mg total) by mouth 2 (two) times daily. 180 tablet 3   Multiple Vitamins-Minerals (MULTIVITAMIN WITH MINERALS) tablet Take 1 tablet by mouth daily. [facility] men      oxyCODONE-acetaminophen (PERCOCET) 10-325 MG tablet Take 0.5 tablets by mouth every 8 (eight) hours as needed for pain. Pt takes half tablet     polyethylene glycol (MIRALAX / GLYCOLAX) 17 g packet Take 17 g by mouth daily.     rosuvastatin (CRESTOR) 5 MG tablet Take 5 mg by mouth daily.     sildenafil (VIAGRA) 100 MG tablet Take 100 mg by mouth as needed for erectile dysfunction.      zolpidem (AMBIEN) 5 MG tablet Take 5 mg by mouth at bedtime.     No current facility-administered medications for this visit.    REVIEW OF SYSTEMS:   Constitutional: ( - ) fevers, ( - )  chills , ( - ) night sweats Eyes: ( - ) blurriness of vision, ( - ) double vision, ( - ) watery eyes Ears, nose, mouth, throat, and face: ( - ) mucositis, ( - ) sore throat Respiratory: ( - ) cough, ( - ) dyspnea, ( - ) wheezes Cardiovascular: ( - ) palpitation, ( - ) chest discomfort, ( - ) lower extremity swelling Gastrointestinal:  ( - ) nausea, ( - ) heartburn, ( - ) change in bowel habits Skin: ( - ) abnormal skin rashes Lymphatics: ( - ) new lymphadenopathy, ( - ) easy bruising Neurological: ( - ) numbness, ( - ) tingling, ( - ) new weaknesses Behavioral/Psych: ( - ) mood change, ( - ) new changes  All other systems were reviewed with the patient and are negative.  PHYSICAL EXAMINATION: Vitals:   04/08/21 1022  BP: 111/77  Pulse: 79  Resp: 16  Temp: 98.1 F (36.7 C)  SpO2: 99%   Filed Weights   04/08/21 1022  Weight: 174 lb 6.4 oz (79.1 kg)    GENERAL: Well-appearing elderly Caucasian male, alert, no distress and comfortable SKIN: skin color, texture, turgor are normal, no rashes or significant lesions EYES: conjunctiva are pink and non-injected, sclera clear LUNGS: clear to auscultation and percussion with normal breathing effort HEART: regular rate & rhythm and no murmurs and no lower extremity edema Musculoskeletal: no cyanosis of digits and no clubbing  PSYCH: alert & oriented x 3, fluent speech NEURO: no focal motor/sensory deficits  LABORATORY DATA:  I have reviewed the data as listed CBC Latest Ref Rng & Units 04/08/2021 02/12/2021 01/07/2021  WBC 4.0 - 10.5 K/uL 6.0 6.7 6.8  Hemoglobin 13.0 - 17.0 g/dL 11.1(L) 11.2(L) 11.5(L)  Hematocrit 39.0 - 52.0 % 33.2(L) 33.0(L) 34.8(L)  Platelets 150 - 400 K/uL 330 332 350    CMP Latest Ref Rng & Units 04/08/2021 01/07/2021  06/21/2020  Glucose 70 -  99 mg/dL 104(H) 91 207(H)  BUN 8 - 23 mg/dL 24(H) 24(H) 26(H)  Creatinine 0.61 - 1.24 mg/dL 1.30(H) 1.24 0.99  Sodium 135 - 145 mmol/L 141 143 138  Potassium 3.5 - 5.1 mmol/L 4.9 4.7 4.4  Chloride 98 - 111 mmol/L 107 108 110  CO2 22 - 32 mmol/L 30 25 21(L)  Calcium 8.9 - 10.3 mg/dL 9.4 9.0 8.2(L)  Total Protein 6.5 - 8.1 g/dL 6.6 6.5 -  Total Bilirubin 0.3 - 1.2 mg/dL 1.5(H) 1.5(H) -  Alkaline Phos 38 - 126 U/L 51 67 -  AST 15 - 41 U/L 17 23 -  ALT 0 - 44 U/L 11 13 -    Lab Results  Component Value Date   MPROTEIN Not Observed 01/07/2021   Lab Results  Component Value Date   KPAFRELGTCHN 27.7 (H) 01/07/2021   LAMBDASER 17.4 01/07/2021   KAPLAMBRATIO 1.59 01/07/2021   RADIOGRAPHIC STUDIES: No results found.  ASSESSMENT & PLAN Raymond Ray 71 y.o. male with medical history significant for low-grade myelodysplastic syndrome who presents for a follow up visit.   # Macrocytic Anemia # Low Grade Myelodysplastic Syndrome -- No evidence of nutritional deficiency or other possible causes of the patient's macrocytic anemia --Bone marrow biopsy was performed on 02/12/2021 and showed evidence of what appears to be a low-grade myelodysplastic syndrome with ring sideroblasts --Patient has had a low-grade anemia and macrocytosis for at least 5 years with evidence of this dating back 10 years.  Appears to be a stable process --Recommend return to clinic in 6 months time to reevaluate.  If everything is stable at that time could pursue 53-monthlabs with every 12 month clinic visits.  No orders of the defined types were placed in this encounter.   All questions were answered. The patient knows to call the clinic with any problems, questions or concerns.  A total of more than 30 minutes were spent on this encounter with face-to-face time and non-face-to-face time, including preparing to see the patient, ordering tests and/or medications, counseling the patient  and coordination of care as outlined above.   JLedell Peoples MD Department of Hematology/Oncology [facility] LLCPhone: [phone]: [phone]: [email or web address]  04/08/2021 11:39 AM

## 2021-04-08 ENCOUNTER — Other Ambulatory Visit: Payer: Self-pay

## 2021-04-08 ENCOUNTER — Inpatient Hospital Stay: Payer: Medicare Other | Attending: Hematology and Oncology

## 2021-04-08 ENCOUNTER — Other Ambulatory Visit: Payer: Self-pay | Admitting: Cardiovascular Disease

## 2021-04-08 ENCOUNTER — Inpatient Hospital Stay (HOSPITAL_BASED_OUTPATIENT_CLINIC_OR_DEPARTMENT_OTHER): Payer: Medicare Other | Admitting: Hematology and Oncology

## 2021-04-08 ENCOUNTER — Other Ambulatory Visit (HOSPITAL_BASED_OUTPATIENT_CLINIC_OR_DEPARTMENT_OTHER): Payer: Self-pay | Admitting: *Deleted

## 2021-04-08 VITALS — BP 111/77 | HR 79 | Temp 98.1°F | Resp 16 | Wt 174.4 lb

## 2021-04-08 DIAGNOSIS — D539 Nutritional anemia, unspecified: Secondary | ICD-10-CM | POA: Diagnosis not present

## 2021-04-08 DIAGNOSIS — Z803 Family history of malignant neoplasm of breast: Secondary | ICD-10-CM | POA: Diagnosis not present

## 2021-04-08 DIAGNOSIS — I10 Essential (primary) hypertension: Secondary | ICD-10-CM | POA: Diagnosis not present

## 2021-04-08 DIAGNOSIS — D461 Refractory anemia with ring sideroblasts: Secondary | ICD-10-CM | POA: Insufficient documentation

## 2021-04-08 DIAGNOSIS — C61 Malignant neoplasm of prostate: Secondary | ICD-10-CM

## 2021-04-08 DIAGNOSIS — Z8546 Personal history of malignant neoplasm of prostate: Secondary | ICD-10-CM | POA: Diagnosis not present

## 2021-04-08 LAB — CMP (CANCER CENTER ONLY)
ALT: 11 U/L (ref 0–44)
AST: 17 U/L (ref 15–41)
Albumin: 4.3 g/dL (ref 3.5–5.0)
Alkaline Phosphatase: 51 U/L (ref 38–126)
Anion gap: 4 — ABNORMAL LOW (ref 5–15)
BUN: 24 mg/dL — ABNORMAL HIGH (ref 8–23)
CO2: 30 mmol/L (ref 22–32)
Calcium: 9.4 mg/dL (ref 8.9–10.3)
Chloride: 107 mmol/L (ref 98–111)
Creatinine: 1.3 mg/dL — ABNORMAL HIGH (ref 0.61–1.24)
GFR, Estimated: 59 mL/min — ABNORMAL LOW (ref >60)
Glucose, Bld: 104 mg/dL — ABNORMAL HIGH (ref 70–99)
Potassium: 4.9 mmol/L (ref 3.5–5.1)
Sodium: 141 mmol/L (ref 135–145)
Total Bilirubin: 1.5 mg/dL — ABNORMAL HIGH (ref 0.3–1.2)
Total Protein: 6.6 g/dL (ref 6.5–8.1)

## 2021-04-08 LAB — CBC WITH DIFFERENTIAL (CANCER CENTER ONLY)
Abs Immature Granulocytes: 0.01 10*3/uL (ref 0.00–0.07)
Basophils Absolute: 0.1 10*3/uL (ref 0.0–0.1)
Basophils Relative: 1 %
Eosinophils Absolute: 0.2 10*3/uL (ref 0.0–0.5)
Eosinophils Relative: 3 %
HCT: 33.2 % — ABNORMAL LOW (ref 39.0–52.0)
Hemoglobin: 11.1 g/dL — ABNORMAL LOW (ref 13.0–17.0)
Immature Granulocytes: 0 %
Lymphocytes Relative: 26 %
Lymphs Abs: 1.5 10*3/uL (ref 0.7–4.0)
MCH: 35.5 pg — ABNORMAL HIGH (ref 26.0–34.0)
MCHC: 33.4 g/dL (ref 30.0–36.0)
MCV: 106.1 fL — ABNORMAL HIGH (ref 80.0–100.0)
Monocytes Absolute: 0.6 10*3/uL (ref 0.1–1.0)
Monocytes Relative: 10 %
Neutro Abs: 3.6 10*3/uL (ref 1.7–7.7)
Neutrophils Relative %: 60 %
Platelet Count: 330 10*3/uL (ref 150–400)
RBC: 3.13 MIL/uL — ABNORMAL LOW (ref 4.22–5.81)
RDW: 16.2 % — ABNORMAL HIGH (ref 11.5–15.5)
WBC Count: 6 10*3/uL (ref 4.0–10.5)
nRBC: 0 % (ref 0.0–0.2)

## 2021-04-08 NOTE — Telephone Encounter (Signed)
Rx(s) sent to pharmacy electronically.  

## 2021-04-08 NOTE — Addendum Note (Signed)
Addended by: Ernie Hew D on: 04/08/2021 08:12 AM   Modules accepted: Orders

## 2021-04-09 ENCOUNTER — Telehealth: Payer: Self-pay | Admitting: *Deleted

## 2021-04-09 LAB — PROSTATE-SPECIFIC AG, SERUM (LABCORP): Prostate Specific Ag, Serum: <0.1 ng/mL (ref 0.0–4.0)

## 2021-04-09 NOTE — Telephone Encounter (Signed)
-----   Message from Orson Slick, MD sent at 04/09/2021  8:43 AM EST ----- Please let Mr. Loftus know that his PSA is <0.1. This is considered undetectable and an excellent result. We will plan to see him back in 6 months as scheduled.   ----- Message ----- From: Buel Ream, Lab [facility] [place] Sent: 04/08/2021  10:10 AM EST To: Orson Slick, MD

## 2021-04-09 NOTE — Telephone Encounter (Signed)
Notified of message below

## 2021-04-10 ENCOUNTER — Ambulatory Visit (INDEPENDENT_AMBULATORY_CARE_PROVIDER_SITE_OTHER): Payer: Medicare Other

## 2021-04-10 ENCOUNTER — Other Ambulatory Visit: Payer: Self-pay

## 2021-04-10 DIAGNOSIS — Z954 Presence of other heart-valve replacement: Secondary | ICD-10-CM | POA: Diagnosis not present

## 2021-04-10 DIAGNOSIS — Z9889 Other specified postprocedural states: Secondary | ICD-10-CM | POA: Diagnosis not present

## 2021-04-10 DIAGNOSIS — I4821 Permanent atrial fibrillation: Secondary | ICD-10-CM | POA: Diagnosis not present

## 2021-04-10 LAB — ECHOCARDIOGRAM COMPLETE
AR max vel: 3.85 cm2
AV Area VTI: 4.06 cm2
AV Area mean vel: 3.6 cm2
AV Mean grad: 3 mmHg
AV Peak grad: 5 mmHg
Ao pk vel: 1.12 m/s
Area-P 1/2: 2.77 cm2
MV VTI: 2.56 cm2
S' Lateral: 2.88 cm
Single Plane A4C EF: 47 %

## 2021-04-11 ENCOUNTER — Telehealth (HOSPITAL_BASED_OUTPATIENT_CLINIC_OR_DEPARTMENT_OTHER): Payer: Self-pay | Admitting: *Deleted

## 2021-04-11 ENCOUNTER — Encounter (HOSPITAL_BASED_OUTPATIENT_CLINIC_OR_DEPARTMENT_OTHER): Payer: Self-pay | Admitting: Cardiovascular Disease

## 2021-04-11 DIAGNOSIS — I4821 Permanent atrial fibrillation: Secondary | ICD-10-CM

## 2021-04-11 NOTE — Telephone Encounter (Signed)
Please advise 

## 2021-04-11 NOTE — Telephone Encounter (Signed)
Below mychart message from patient   Hello, Dr. Oval Linsey, Is there anything to be concerned about regarding my echo test?  I read the summary, but don't fully understand all the wording. Thank you. It was good seeing you earlier this week. Raymond Ray  Will forward to Dr Oval Linsey for review

## 2021-04-16 NOTE — Telephone Encounter (Signed)
Results released in mychart with Dr Blenda Mounts comments

## 2021-04-16 NOTE — Telephone Encounter (Signed)
Results shared with patient and future echo ordered per Dr. Oval Linsey!

## 2021-04-24 DIAGNOSIS — D1801 Hemangioma of skin and subcutaneous tissue: Secondary | ICD-10-CM | POA: Diagnosis not present

## 2021-04-24 DIAGNOSIS — L821 Other seborrheic keratosis: Secondary | ICD-10-CM | POA: Diagnosis not present

## 2021-04-24 DIAGNOSIS — L820 Inflamed seborrheic keratosis: Secondary | ICD-10-CM | POA: Diagnosis not present

## 2021-04-24 DIAGNOSIS — L111 Transient acantholytic dermatosis [Grover]: Secondary | ICD-10-CM | POA: Diagnosis not present

## 2021-04-24 DIAGNOSIS — L570 Actinic keratosis: Secondary | ICD-10-CM | POA: Diagnosis not present

## 2021-04-24 DIAGNOSIS — D225 Melanocytic nevi of trunk: Secondary | ICD-10-CM | POA: Diagnosis not present

## 2021-04-24 DIAGNOSIS — Z85828 Personal history of other malignant neoplasm of skin: Secondary | ICD-10-CM | POA: Diagnosis not present

## 2021-05-15 DIAGNOSIS — D469 Myelodysplastic syndrome, unspecified: Secondary | ICD-10-CM | POA: Diagnosis not present

## 2021-05-22 DIAGNOSIS — N5201 Erectile dysfunction due to arterial insufficiency: Secondary | ICD-10-CM | POA: Diagnosis not present

## 2021-05-22 DIAGNOSIS — C61 Malignant neoplasm of prostate: Secondary | ICD-10-CM | POA: Diagnosis not present

## 2021-05-27 DIAGNOSIS — Z96642 Presence of left artificial hip joint: Secondary | ICD-10-CM | POA: Diagnosis not present

## 2021-06-02 ENCOUNTER — Other Ambulatory Visit (HOSPITAL_BASED_OUTPATIENT_CLINIC_OR_DEPARTMENT_OTHER): Payer: Self-pay | Admitting: Cardiovascular Disease

## 2021-08-06 ENCOUNTER — Telehealth: Payer: Self-pay | Admitting: Hematology and Oncology

## 2021-08-06 NOTE — Telephone Encounter (Signed)
Per 6/13 provider reschedule called and left message about appointment reschedule.  Details and call back number left

## 2021-08-09 DIAGNOSIS — M5416 Radiculopathy, lumbar region: Secondary | ICD-10-CM | POA: Diagnosis not present

## 2021-08-09 DIAGNOSIS — M5136 Other intervertebral disc degeneration, lumbar region: Secondary | ICD-10-CM | POA: Diagnosis not present

## 2021-08-12 ENCOUNTER — Telehealth: Payer: Self-pay | Admitting: *Deleted

## 2021-08-12 NOTE — Telephone Encounter (Signed)
Received call from pt requesting that we change his appt on 08/29/21 as he will be out of town. Advised we can see him on 08/14/21 @ 8am for labs and then 8:20 am for MD appt. Pt voiced understanding.  Scheduling request sent.

## 2021-08-13 ENCOUNTER — Encounter: Payer: Self-pay | Admitting: Hematology and Oncology

## 2021-08-14 ENCOUNTER — Other Ambulatory Visit: Payer: Self-pay

## 2021-08-14 ENCOUNTER — Inpatient Hospital Stay (HOSPITAL_BASED_OUTPATIENT_CLINIC_OR_DEPARTMENT_OTHER): Payer: Medicare Other | Admitting: Hematology and Oncology

## 2021-08-14 ENCOUNTER — Other Ambulatory Visit: Payer: Self-pay | Admitting: Hematology and Oncology

## 2021-08-14 ENCOUNTER — Inpatient Hospital Stay: Payer: Medicare Other | Attending: Hematology and Oncology

## 2021-08-14 VITALS — BP 109/77 | HR 70 | Resp 15 | Wt 175.2 lb

## 2021-08-14 DIAGNOSIS — D4620 Refractory anemia with excess of blasts, unspecified: Secondary | ICD-10-CM | POA: Insufficient documentation

## 2021-08-14 DIAGNOSIS — D539 Nutritional anemia, unspecified: Secondary | ICD-10-CM | POA: Diagnosis not present

## 2021-08-14 DIAGNOSIS — Z8546 Personal history of malignant neoplasm of prostate: Secondary | ICD-10-CM | POA: Insufficient documentation

## 2021-08-14 DIAGNOSIS — I10 Essential (primary) hypertension: Secondary | ICD-10-CM | POA: Insufficient documentation

## 2021-08-14 DIAGNOSIS — Z803 Family history of malignant neoplasm of breast: Secondary | ICD-10-CM | POA: Insufficient documentation

## 2021-08-14 DIAGNOSIS — C61 Malignant neoplasm of prostate: Secondary | ICD-10-CM

## 2021-08-14 LAB — CMP (CANCER CENTER ONLY)
ALT: 11 U/L (ref 0–44)
AST: 18 U/L (ref 15–41)
Albumin: 4.4 g/dL (ref 3.5–5.0)
Alkaline Phosphatase: 45 U/L (ref 38–126)
Anion gap: 5 (ref 5–15)
BUN: 25 mg/dL — ABNORMAL HIGH (ref 8–23)
CO2: 27 mmol/L (ref 22–32)
Calcium: 9.6 mg/dL (ref 8.9–10.3)
Chloride: 109 mmol/L (ref 98–111)
Creatinine: 1.25 mg/dL — ABNORMAL HIGH (ref 0.61–1.24)
GFR, Estimated: >60 mL/min (ref >60)
Glucose, Bld: 98 mg/dL (ref 70–99)
Potassium: 4.6 mmol/L (ref 3.5–5.1)
Sodium: 141 mmol/L (ref 135–145)
Total Bilirubin: 1.4 mg/dL — ABNORMAL HIGH (ref 0.3–1.2)
Total Protein: 6.7 g/dL (ref 6.5–8.1)

## 2021-08-14 LAB — CBC WITH DIFFERENTIAL (CANCER CENTER ONLY)
Abs Immature Granulocytes: 0.01 10*3/uL (ref 0.00–0.07)
Basophils Absolute: 0.1 10*3/uL (ref 0.0–0.1)
Basophils Relative: 1 %
Eosinophils Absolute: 0.2 10*3/uL (ref 0.0–0.5)
Eosinophils Relative: 4 %
HCT: 29.8 % — ABNORMAL LOW (ref 39.0–52.0)
Hemoglobin: 10.3 g/dL — ABNORMAL LOW (ref 13.0–17.0)
Immature Granulocytes: 0 %
Lymphocytes Relative: 33 %
Lymphs Abs: 1.4 10*3/uL (ref 0.7–4.0)
MCH: 36.5 pg — ABNORMAL HIGH (ref 26.0–34.0)
MCHC: 34.6 g/dL (ref 30.0–36.0)
MCV: 105.7 fL — ABNORMAL HIGH (ref 80.0–100.0)
Monocytes Absolute: 0.5 10*3/uL (ref 0.1–1.0)
Monocytes Relative: 12 %
Neutro Abs: 2.1 10*3/uL (ref 1.7–7.7)
Neutrophils Relative %: 50 %
Platelet Count: 326 10*3/uL (ref 150–400)
RBC: 2.82 MIL/uL — ABNORMAL LOW (ref 4.22–5.81)
RDW: 16 % — ABNORMAL HIGH (ref 11.5–15.5)
WBC Count: 4.3 10*3/uL (ref 4.0–10.5)
nRBC: 0 % (ref 0.0–0.2)

## 2021-08-14 LAB — LACTATE DEHYDROGENASE: LDH: 126 U/L (ref 98–192)

## 2021-08-14 NOTE — Progress Notes (Signed)
[place] [phone]   [phone]  PROGRESS NOTE  Patient Care Team: Crist Infante, MD as PCP - General (Internal Medicine) [name], MD as PCP - Cardiology (Cardiology) Alda Berthold, DO as Consulting Physician (Neurology) Cira Rue, RN Nurse Navigator as Registered Nurse (Medical Oncology)  Hematological/Oncological History # Macrocytic Anemia # Low Grade Myelodysplastic Syndrome  03/03/2011: WBC 4.1, Hgb 12.7, MCV 104.6, Plt 203 01/30/2017: WBC 12.1, Hgb 9.2, MCV 100.7, Plt 232 09/28/2019: WBC 24, Hgb 9.3, MCV 107.9, Plt 189 06/21/2020: WBC 10.8, Hgb 8.5, MCV 107.1, Plt 264 01/07/2021: establish care with Dr. Lorenso Courier     02/12/2021: Bmbx showed a hypercellular marrow with dyspoiesis and ring sideroblasts  Interval History:  Raymond Ray 71 y.o. male with medical history significant for low-grade myelodysplastic syndrome who presents for a follow up visit. The patient's last visit was on 04/08/2021. In the interim since the last visit he has had no major changes in his health.  On exam today Raymond Ray notes he has been at his baseline level of health in the interim since her last visit.  He reports overall he is "about the same".  He notes that his energy levels have been good and that they are excellent today.  He reports that occasionally he does notice drops in energy throughout the day.  He is not having any issues with bleeding or bruising.  His appetite is good and his weight is stable.  He has not started any new medications or had any other major changes in his health.  Otherwise he feels well and has no questions concerns or complaints.  Full 10 point ROS is listed below.  MEDICAL HISTORY:  Past Medical History:  Diagnosis Date   Allergy    Anemia    history of   Ascending aortic aneurysm 05/29/2020   CAD in native artery 05/29/2020   35% LAD on cath.  LDL was 74 on 04/2018.  LDL goal is less than 70.  It was 46 on 12/2019.   Continue rosuvastatin.   Carotid stenosis 05/29/2020   Chronic atrial fibrillation ([place])    Dysrhythmia 1970s   Exertional dyspnea 04/11/2020   Heart murmur    History of colonoscopy 04/2001   negative   Hypertension    Mitral valve prolapse    Nonrheumatic mitral (valve) insufficiency    Osteoarthritis of hip    Permanent atrial fibrillation (H[facility])    PONV (postoperative nausea and vomiting)    left knee cartilage removal;     Prostate cancer ([place])    S/P minimally-invasive mitral valve repair 09/28/2019   Complex valvuloplasty including artificial Gore-tex neochord placement x6 with 36 mm Medtronic Sinuform annuloplasty ring via right mini thoracotomy approach   Tricuspid regurgitation    Vertigo 01/07/2017   currently not symptomatic     SURGICAL HISTORY: Past Surgical History:  Procedure Laterality Date   BUBBLE STUDY  08/24/2019   Procedure: BUBBLE STUDY;  Surgeon: [name], MD;  Location: [facility] ENDOSCOPY;  Service: Cardiology;;   CARDIAC CATHETERIZATION  09/28/2019   CLIPPING OF ATRIAL APPENDAGE N/A 09/28/2019   Procedure: CLIPPING OF ATRIAL APPENDAGE USING ATRICURE CLIP SIZE 45MM;  Surgeon: Rexene Alberts, MD;  Location: [facility];  Service: Open Heart Surgery;  Laterality: N/A;   COLONOSCOPY     KNEE SURGERY     left at age 39 in [place] Bilateral 06/02/2019   Procedure: Noel Journey, PELVIC;  Surgeon: [name], MD;  Location: [facility] ORS;  Service: Urology;  Laterality: Bilateral;   MINIMALLY INVASIVE TRICUSPID VALVE REPAIR Right 09/28/2019   Procedure: possible MINIMALLY INVASIVE TRICUSPID VALVE REPAIR;  Surgeon: Rexene Alberts, MD;  Location: [place] [facility];  Service: Open Heart Surgery;  Laterality: Right;   MITRAL VALVE REPAIR Right 09/28/2019   Procedure: MINIMALLY INVASIVE MITRAL VALVE REPAIR (MVR) USING SIMUFORM 36MM;  Surgeon: Rexene Alberts, MD;  Location: [facility];  Service: Open Heart Surgery;  Laterality: Right;   PROSTATE BIOPSY     RIGHT/LEFT  HEART CATH AND CORONARY ANGIOGRAPHY N/A 09/07/2019   Procedure: RIGHT/LEFT HEART CATH AND CORONARY ANGIOGRAPHY;  Surgeon: Sherren Mocha, MD;  Location: [facility];  Service: Cardiovascular;  Laterality: N/A;   ROBOT ASSISTED LAPAROSCOPIC RADICAL PROSTATECTOMY N/A 06/02/2019   Procedure: XI ROBOTIC ASSISTED LAPAROSCOPIC RADICAL PROSTATECTOMY LEVEL 2;  Surgeon: [name], MD;  Location: [facility];  Service: Urology;  Laterality: N/A;   spinal injection     December 2013   TEE WITHOUT CARDIOVERSION N/A 08/24/2019   Procedure: TRANSESOPHAGEAL ECHOCARDIOGRAM (TEE);  Surgeon: [name], MD;  Location: [facility];  Service: Cardiology;  Laterality: N/A;   TEE WITHOUT CARDIOVERSION N/A 09/28/2019   Procedure: TRANSESOPHAGEAL ECHOCARDIOGRAM (TEE);  Surgeon: Rexene Alberts, MD;  Location: [facility];  Service: Open Heart Surgery;  Laterality: N/A;   TOTAL HIP ARTHROPLASTY Right 01/29/2017   Procedure: RIGHT TOTAL HIP ARTHROPLASTY ANTERIOR APPROACH;  Surgeon: Rod Can, MD;  Location: [facility];  Service: Orthopedics;  Laterality: Right;   TOTAL HIP ARTHROPLASTY Left 06/20/2020   Procedure: TOTAL HIP ARTHROPLASTY ANTERIOR APPROACH;  Surgeon: Rod Can, MD;  Location: [facility];  Service: Orthopedics;  Laterality: Left;   VASECTOMY      SOCIAL HISTORY: Social History   Socioeconomic History   Marital status: Married    Spouse name: [name]   Number of children: 2   Years of education: Not on file   Highest education level: Not on file  Occupational History   Occupation: Retired  Tobacco Use   Smoking status: Never   Smokeless tobacco: Never  Vaping Use   Vaping Use: Never used  Substance and Sexual Activity   Alcohol use: No   Drug use: No   Sexual activity: Yes  Other Topics Concern   Not on file  Social History Narrative   Right handed   Lives in two story home with wife   Daughter, [name], resides in [place]. 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 Daughter, [name],  resides in [place]. 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    Social Determinants of Health   Financial Resource Strain: Not on file  Food Insecurity: Not on file  Transportation Needs: Not on file  Physical Activity: Not on file  Stress: Not on file  Social Connections: Not on file  Intimate Partner Violence: Not on file    FAMILY HISTORY: Family History  Problem Relation Age of Onset   Arrhythmia Father        tachycardia   Cancer Mother    Heart attack Mother    Diabetes Mother    Breast cancer Sister    Breast cancer Paternal Aunt    Colon cancer Neg Hx    Esophageal cancer Neg Hx    Stomach cancer Neg Hx    Rectal cancer Neg Hx     ALLERGIES:  has No Known Allergies.  MEDICATIONS:  Current Outpatient Medications  Medication Sig Dispense Refill   apixaban (ELIQUIS) 5 MG TABS  tablet Take 1 tablet (5 mg total) by mouth 2 (two) times daily. 180 tablet 3   ascorbic acid (VITAMIN C) 500 MG tablet Take 500 mg by mouth daily.      digoxin (LANOXIN) 0.125 MG tablet Take 1 tablet (0.125 mg total) by mouth daily. 90 tablet 2   fexofenadine (ALLEGRA) 180 MG tablet Take 180 mg by mouth daily as needed for allergies.     hydrocortisone cream 1 % Apply 1 application topically 2 (two) times daily as needed for itching.     LORazepam (ATIVAN) 0.5 MG tablet Take 0.5 mg by mouth once.     metoprolol tartrate (LOPRESSOR) 25 MG tablet 1 AND 1/2 TABLETS TWICE A DAY 270 tablet 1   Multiple Vitamins-Minerals (MULTIVITAMIN WITH MINERALS) tablet Take 1 tablet by mouth daily. [facility] men      oxyCODONE-acetaminophen (PERCOCET) 10-325 MG tablet Take 0.5 tablets by mouth every 8 (eight) hours as needed for pain. Pt takes half tablet     polyethylene glycol (MIRALAX / GLYCOLAX) 17 g packet Take 17 g by mouth daily.     rosuvastatin (CRESTOR) 5 MG tablet Take 5 mg by mouth daily.     sildenafil (VIAGRA) 100 MG tablet Take 100 mg by mouth as needed for erectile dysfunction.     zolpidem (AMBIEN) 5 MG tablet  Take 5 mg by mouth at bedtime.     No current facility-administered medications for this visit.    REVIEW OF SYSTEMS:   Constitutional: ( - ) fevers, ( - )  chills , ( - ) night sweats Eyes: ( - ) blurriness of vision, ( - ) double vision, ( - ) watery eyes Ears, nose, mouth, throat, and face: ( - ) mucositis, ( - ) sore throat Respiratory: ( - ) cough, ( - ) dyspnea, ( - ) wheezes Cardiovascular: ( - ) palpitation, ( - ) chest discomfort, ( - ) lower extremity swelling Gastrointestinal:  ( - ) nausea, ( - ) heartburn, ( - ) change in bowel habits Skin: ( - ) abnormal skin rashes Lymphatics: ( - ) new lymphadenopathy, ( - ) easy bruising Neurological: ( - ) numbness, ( - ) tingling, ( - ) new weaknesses Behavioral/Psych: ( - ) mood change, ( - ) new changes  All other systems were reviewed with the patient and are negative.  PHYSICAL EXAMINATION: Vitals:   08/14/21 0824  BP: 109/77  Pulse: 70  Resp: 15  SpO2: 98%   Filed Weights   08/14/21 0824  Weight: 175 lb 3.2 oz (79.5 kg)    GENERAL: Well-appearing elderly Caucasian male, alert, no distress and comfortable SKIN: skin color, texture, turgor are normal, no rashes or significant lesions EYES: conjunctiva are pink and non-injected, sclera clear LUNGS: clear to auscultation and percussion with normal breathing effort HEART: regular rate & rhythm and no murmurs and no lower extremity edema Musculoskeletal: no cyanosis of digits and no clubbing  PSYCH: alert & oriented x 3, fluent speech NEURO: no focal motor/sensory deficits  LABORATORY DATA:  I have reviewed the data as listed    Latest Ref Rng & Units 08/14/2021    8:03 AM 04/08/2021   10:05 AM 02/12/2021    7:30 AM  CBC  WBC 4.0 - 10.5 K/uL 4.3  6.0  6.7   Hemoglobin 13.0 - 17.0 g/dL 10.3  11.1  11.2   Hematocrit 39.0 - 52.0 % 29.8  33.2  33.0   Platelets 150 - 400 K/uL 326  330  332        Latest Ref Rng & Units 08/14/2021    8:03 AM 04/08/2021   10:05 AM  01/07/2021    2:10 PM  CMP  Glucose 70 - 99 mg/dL 98  104  91   BUN 8 - 23 mg/dL 25  24  24    Creatinine 0.61 - 1.24 mg/dL 1.25  1.30  1.24   Sodium 135 - 145 mmol/L 141  141  143   Potassium 3.5 - 5.1 mmol/L 4.6  4.9  4.7   Chloride 98 - 111 mmol/L 109  107  108   CO2 22 - 32 mmol/L 27  30  25    Calcium 8.9 - 10.3 mg/dL 9.6  9.4  9.0   Total Protein 6.5 - 8.1 g/dL 6.7  6.6  6.5   Total Bilirubin 0.3 - 1.2 mg/dL 1.4  1.5  1.5   Alkaline Phos 38 - 126 U/L 45  51  67   AST 15 - 41 U/L 18  17  23    ALT 0 - 44 U/L 11  11  13      Lab Results  Component Value Date   MPROTEIN Not Observed 01/07/2021   Lab Results  Component Value Date   KPAFRELGTCHN 27.7 (H) 01/07/2021   LAMBDASER 17.4 01/07/2021   KAPLAMBRATIO 1.59 01/07/2021   RADIOGRAPHIC STUDIES: No results found.  ASSESSMENT & PLAN Raymond Ray 71 y.o. male with medical history significant for low-grade myelodysplastic syndrome who presents for a follow up visit.   # Macrocytic Anemia # Low Grade Myelodysplastic Syndrome -- No evidence of nutritional deficiency or other possible causes of the patient's macrocytic anemia --Bone marrow biopsy was performed on 02/12/2021 and showed evidence of what appears to be a low-grade myelodysplastic syndrome with ring sideroblasts --Patient has had a low-grade anemia and macrocytosis for at least 5 years with evidence of this dating back 10 years.  Appears to be a stable process --Discussed with him that the treatment of choice would be erythropoietin shots if his hemoglobin were to consistently be less than 10. --labs today show white blood cell count 4.3, hemoglobin 10.3, MCV 105.7, and platelets of 326.  Creatinine was 1.25 --Recommend return to clinic in 6 months time to reevaluate with interval 9-monthlabs  No orders of the defined types were placed in this encounter.   All questions were answered. The patient knows to call the clinic with any problems, questions or  concerns.  A total of more than 30 minutes were spent on this encounter with face-to-face time and non-face-to-face time, including preparing to see the patient, ordering tests and/or medications, counseling the patient and coordination of care as outlined above.   Raymond Peoples MD Department of Hematology/Oncology [facility]Phone: [phone]: [phone]: [email or web address]  08/14/2021 9:17 AM

## 2021-08-15 LAB — PROSTATE-SPECIFIC AG, SERUM (LABCORP): Prostate Specific Ag, Serum: <0.1 ng/mL (ref 0.0–4.0)

## 2021-08-19 ENCOUNTER — Other Ambulatory Visit: Payer: Self-pay | Admitting: Cardiovascular Disease

## 2021-08-19 ENCOUNTER — Ambulatory Visit (INDEPENDENT_AMBULATORY_CARE_PROVIDER_SITE_OTHER): Payer: Medicare Other

## 2021-08-19 ENCOUNTER — Telehealth: Payer: Self-pay | Admitting: Cardiovascular Disease

## 2021-08-19 DIAGNOSIS — L821 Other seborrheic keratosis: Secondary | ICD-10-CM | POA: Diagnosis not present

## 2021-08-19 DIAGNOSIS — R0609 Other forms of dyspnea: Secondary | ICD-10-CM

## 2021-08-19 DIAGNOSIS — Z85828 Personal history of other malignant neoplasm of skin: Secondary | ICD-10-CM | POA: Diagnosis not present

## 2021-08-19 DIAGNOSIS — I4821 Permanent atrial fibrillation: Secondary | ICD-10-CM

## 2021-08-19 DIAGNOSIS — Z5181 Encounter for therapeutic drug level monitoring: Secondary | ICD-10-CM

## 2021-08-19 NOTE — Telephone Encounter (Signed)
Pt c/o medication issue:  1. Name of Medication:   digoxin (LANOXIN) 0.125 MG tablet    metoprolol tartrate (LOPRESSOR) 25 MG tablet    2. How are you currently taking this medication (dosage and times per day)?   3. Are you having a reaction (difficulty breathing--STAT)? No  4. What is your medication issue? Pt states that occasionally he gets short winded. Pt would like to know if maybe medication dosage needs to be changed. Please advise

## 2021-08-20 DIAGNOSIS — D469 Myelodysplastic syndrome, unspecified: Secondary | ICD-10-CM | POA: Diagnosis not present

## 2021-08-20 DIAGNOSIS — G4700 Insomnia, unspecified: Secondary | ICD-10-CM | POA: Diagnosis not present

## 2021-08-20 DIAGNOSIS — G609 Hereditary and idiopathic neuropathy, unspecified: Secondary | ICD-10-CM | POA: Diagnosis not present

## 2021-08-21 DIAGNOSIS — Z5181 Encounter for therapeutic drug level monitoring: Secondary | ICD-10-CM | POA: Diagnosis not present

## 2021-08-21 DIAGNOSIS — Z79899 Other long term (current) drug therapy: Secondary | ICD-10-CM | POA: Diagnosis not present

## 2021-08-23 ENCOUNTER — Other Ambulatory Visit: Payer: Medicare Other

## 2021-08-23 ENCOUNTER — Ambulatory Visit: Payer: Medicare Other | Admitting: Hematology and Oncology

## 2021-08-23 ENCOUNTER — Telehealth (HOSPITAL_BASED_OUTPATIENT_CLINIC_OR_DEPARTMENT_OTHER): Payer: Self-pay | Admitting: *Deleted

## 2021-08-23 ENCOUNTER — Telehealth: Payer: Self-pay | Admitting: *Deleted

## 2021-08-23 LAB — DIGOXIN LEVEL: Digoxin, Serum: <0.4 ng/mL — ABNORMAL LOW (ref 0.5–0.9)

## 2021-08-23 NOTE — Telephone Encounter (Signed)
Received vm message from pt that he is feeling very fatigued this week. He noted that his HGB went from 11.1 to 10.3 in 4 months He voiced concern about this. Returned call to patient and spoke with him.  He states he feels better today than he has all week.  His main concern was getting his labs rechecked sooner than 3 months from now. Advised that we can re-check in 1 month.  He is agreeable to this. Scheduling message sent for labs the last week of July.

## 2021-08-23 NOTE — Telephone Encounter (Signed)
Left message for patient to call and schedule 1 month  follow up wit Laurann Montana, NP

## 2021-08-26 DIAGNOSIS — I4821 Permanent atrial fibrillation: Secondary | ICD-10-CM | POA: Diagnosis not present

## 2021-08-26 DIAGNOSIS — R0609 Other forms of dyspnea: Secondary | ICD-10-CM | POA: Diagnosis not present

## 2021-08-29 ENCOUNTER — Ambulatory Visit: Payer: Medicare Other | Admitting: Hematology and Oncology

## 2021-08-29 ENCOUNTER — Other Ambulatory Visit: Payer: Medicare Other

## 2021-08-30 ENCOUNTER — Telehealth: Payer: Self-pay | Admitting: Cardiovascular Disease

## 2021-08-30 NOTE — Telephone Encounter (Signed)
[name], MD  08/30/2021 12:41 PM EDT     Digoxin level is very low.  If the monitor shows that his heart rates are elevated then we have room to increase the dose.    Spoke with patient regarding labs and Dr Blenda Mounts comment  He would like to double on his Digoxin until his visit 7/17 with Overton Mam NP to see if this helps him feel better  Discussed with Catilin and ok to double up  Will have patient hold digoxin morning of visit so dig level can be rechecked  Advised patient ,verbalized understanding

## 2021-08-30 NOTE — Telephone Encounter (Signed)
Pt is requesting call back to discuss results of his digoxin levels and heart monitor.

## 2021-09-03 ENCOUNTER — Telehealth: Payer: Self-pay | Admitting: Cardiovascular Disease

## 2021-09-03 NOTE — Telephone Encounter (Signed)
Patient called to say his has Myelodysplastic Syndrome, he sees Dr Vianne Bulls, his last hemoglobin was 10.3.  He stated that would explains most of his SOB. He wants to make [place] aware of this.

## 2021-09-03 NOTE — Telephone Encounter (Signed)
FYI

## 2021-09-04 NOTE — Telephone Encounter (Signed)
[name], MD to Gerald Stabs, RN  Me      09/04/21 12:48 PM I am sorry to hear that, thank you for letting me know.   TCR

## 2021-09-05 ENCOUNTER — Encounter: Payer: Self-pay | Admitting: Hematology and Oncology

## 2021-09-05 DIAGNOSIS — I4821 Permanent atrial fibrillation: Secondary | ICD-10-CM | POA: Diagnosis not present

## 2021-09-05 DIAGNOSIS — R0609 Other forms of dyspnea: Secondary | ICD-10-CM | POA: Diagnosis not present

## 2021-09-08 NOTE — Progress Notes (Unsigned)
Office Visit    Patient Name: Raymond Ray Date of Encounter: 09/09/2021  PCP:  Crist Infante, MD   [facility]  Cardiologist:  [name], MD  Advanced Practice Provider:  No care team member to display Electrophysiologist:  None      Chief Complaint    Raymond Ray is a 71 y.o. male presents today for follow up after ZIO monitor   Past Medical History    Past Medical History:  Diagnosis Date   Allergy    Anemia    history of   Ascending aortic aneurysm ([place]) 05/29/2020   CAD in native artery 05/29/2020   35% LAD on cath.  LDL was 74 on 04/2018.  LDL goal is less than 70.  It was 46 on 12/2019.  Continue rosuvastatin.   Carotid stenosis 05/29/2020   Chronic atrial fibrillation ([place])    Dysrhythmia 1970s   Exertional dyspnea 04/11/2020   Heart murmur    History of colonoscopy 04/2001   negative   Hypertension    Mitral valve prolapse    Nonrheumatic mitral (valve) insufficiency    Osteoarthritis of hip    Permanent atrial fibrillation (HCC)    PONV (postoperative nausea and vomiting)    left knee cartilage removal;     Prostate cancer ([place])    S/P minimally-invasive mitral valve repair 09/28/2019   Complex valvuloplasty including artificial Gore-tex neochord placement x6 with 36 mm Medtronic Sinuform annuloplasty ring via right mini thoracotomy approach   Tricuspid regurgitation    Vertigo 01/07/2017   currently not symptomatic    Past Surgical History:  Procedure Laterality Date   BUBBLE STUDY  08/24/2019   Procedure: BUBBLE STUDY;  Surgeon: [name], MD;  Location: [facility] ENDOSCOPY;  Service: Cardiology;;   CARDIAC CATHETERIZATION  09/28/2019   CLIPPING OF ATRIAL APPENDAGE N/A 09/28/2019   Procedure: CLIPPING OF ATRIAL APPENDAGE USING ATRICURE CLIP SIZE 45MM;  Surgeon: Rexene Alberts, MD;  Location: [facility];  Service: Open Heart Surgery;  Laterality: N/A;   COLONOSCOPY     KNEE SURGERY     left at age 54 in [place] Bilateral 06/02/2019   Procedure: LYMPHADENECTOMY, PELVIC;  Surgeon: [name], MD;  Location: [facility];  Service: Urology;  Laterality: Bilateral;   MINIMALLY INVASIVE TRICUSPID VALVE REPAIR Right 09/28/2019   Procedure: possible MINIMALLY INVASIVE TRICUSPID VALVE REPAIR;  Surgeon: Rexene Alberts, MD;  Location: [facility];  Service: Open Heart Surgery;  Laterality: Right;   MITRAL VALVE REPAIR Right 09/28/2019   Procedure: MINIMALLY INVASIVE MITRAL VALVE REPAIR (MVR) USING SIMUFORM 36MM;  Surgeon: Rexene Alberts, MD;  Location: [facility];  Service: Open Heart Surgery;  Laterality: Right;   PROSTATE BIOPSY     RIGHT/LEFT HEART CATH AND CORONARY ANGIOGRAPHY N/A 09/07/2019   Procedure: RIGHT/LEFT HEART CATH AND CORONARY ANGIOGRAPHY;  Surgeon: Sherren Mocha, MD;  Location: [facility];  Service: Cardiovascular;  Laterality: N/A;   ROBOT ASSISTED LAPAROSCOPIC RADICAL PROSTATECTOMY N/A 06/02/2019   Procedure: XI ROBOTIC ASSISTED LAPAROSCOPIC RADICAL PROSTATECTOMY LEVEL 2;  Surgeon: [name], MD;  Location: [facility];  Service: Urology;  Laterality: N/A;   spinal injection     December 2013   TEE WITHOUT CARDIOVERSION N/A 08/24/2019   Procedure: TRANSESOPHAGEAL ECHOCARDIOGRAM (TEE);  Surgeon: [name], MD;  Location: [facility];  Service: Cardiology;  Laterality: N/A;   TEE WITHOUT CARDIOVERSION N/A 09/28/2019   Procedure: TRANSESOPHAGEAL ECHOCARDIOGRAM (TEE);  Surgeon: Rexene Alberts, MD;  Location: [facility];  Service: Open Heart Surgery;  Laterality: N/A;   TOTAL HIP ARTHROPLASTY Right 01/29/2017   Procedure: RIGHT TOTAL HIP ARTHROPLASTY ANTERIOR APPROACH;  Surgeon: Rod Can, MD;  Location: [facility];  Service: Orthopedics;  Laterality: Right;   TOTAL HIP ARTHROPLASTY Left 06/20/2020   Procedure: TOTAL HIP ARTHROPLASTY ANTERIOR APPROACH;  Surgeon: Rod Can, MD;  Location: [facility];  Service: Orthopedics;  Laterality: Left;   VASECTOMY      Allergies  No Known  Allergies  History of Present Illness    Raymond Ray is a 71 y.o. male with a hx of chronic atrial fibrillation s/p LAA clipping, mitral regurgitation secondary to MVP s/p MV repair, mild carotid stenosis, mild CAD last seen 04/05/2021 by Dr. Oval Linsey.  Prior patient of Dr. Mare Ferrari.  Initially developed atrial fibrillation 1996.  Initially treated with amiodarone which was discontinued due to concern for long-term toxicity.  Been on quinidine and transition to digoxin when he moved from [place] to [place].  Previously developed epistaxis with Xarelto and was transition back to Coumadin.  However he was then transitioned to Eliquis with no overt bleeding.  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  Underwent minimally invasive MV repair with Dr. Alfonse Spruce 09/2019 with 36 mm Medtronic ScinoPharm ring and left atrial appendage clip.  He is switched back to warfarin for valvular atrial fibrillation.  Monitor February 2022 with heart rate up to 201 bpm with one 4-second pause at night.  ETT 04/27/2020 poorly controlled heart rate with 1 mm ST depression.  Metoprolol reduced and digoxin resumed.  In the right hip replacement 05/2020.  Bone marrow biopsy 01/2021 for longstanding microcytic anemia.  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  Last seen 04/05/2021 by Dr. Oval Linsey.  Updated echo 07/2021 with abnormal septal motion, EF 50-55%, no RWMA, bilateral atria severely dilated, MV with only trivial regurgitation, moderate TR, mild dilation aortic root 41 mm, mild dilation ascending aorta 39 mm.  He wore 3-day monitor with 2 runs of VT fastest 10 beats 207 bpm.  100% atrial fibrillation burden with average heart rate of 90 bpm.  Rare PVC/PAC. On 08/30/2021 his digoxin dose was increased.  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 He notes worsening exertional dyspnea over the last  month. Usually enjoys staying busy with grandchildren who range from 48-72 years old, mowing the lawn, working out on his elliptical. Has been on increased dose digoxin for about 9 days without much change in symptoms. Has upcoming labs with hematology for MDS.  We reviewed his monitor - one of two VT episodes was triggered and he describes as feeling like a forceful heart beat. No chest pain, lightheadedness, dizziness, near syncope, syncope. No orthopnea, PND. Reports no palpitations.  Notes LE edema that is mild to moderate by end of day and resolved by morning.   Shares with me that he is most bothered by neuropathy. Has a referral to a specialist and is awaiting appointment. He was on gabapentinf for a short term and noted some abdominal bloating though endorses he may not have given it much time to work.  EKGs/Labs/Other Studies Reviewed:   The following studies were reviewed today: Echo 07/2021  1. Anbormal septal motion . Left ventricular ejection fraction, by  estimation, is 50 to 55%. The left ventricle has low normal function.  The  left ventricle has no regional wall motion abnormalities. Left ventricular  diastolic parameters are  indeterminate.   2. Right ventricular systolic function is mildly reduced. The right  ventricular size is mildly enlarged.   3. Left atrial size was severely dilated.   4. Right atrial size was severely dilated.   5. Post repair with annuloplasty ring no significant residual MR or MS .  The mitral valve has been repaired/replaced. Trivial mitral valve  regurgitation. No evidence of mitral stenosis.   6. Tricuspid valve regurgitation is moderate.   7. The aortic valve is tricuspid. There is mild calcification of the  aortic valve. There is mild thickening of the aortic valve. Aortic valve  regurgitation is trivial. Aortic valve sclerosis is present, with no  evidence of aortic valve stenosis.   8. Aortic dilatation noted. There is mild dilatation of the  aortic root,  measuring 41 mm. There is mild dilatation of the ascending aorta,  measuring 39 mm.   9. The inferior vena cava is normal in size with greater than 50%  respiratory variability, suggesting right atrial pressure of 3 mmHg.   Comparison(s): EF 50%, AOR 61m, MV myxomatous, MV mean gradient 2.717mG,  RVSP 30.60m65m.    Echo 09/2019:  1. Normal LV function; s/p MV repair with mild MS (mean gradient 5 mm Hg;  MVA 2.1 cm2) and no MR; moderate biatrial enlargement.   2. Left ventricular ejection fraction, by estimation, is 50 to 55%. The  left ventricle has low normal function. The left ventricle has no regional  wall motion abnormalities.   3. Right ventricular systolic function is normal. The right ventricular  size is normal.   4. Left atrial size was moderately dilated.   5. Right atrial size was moderately dilated.   6. The mitral valve has been repaired/replaced. Trivial mitral valve  regurgitation. Mild mitral stenosis.   7. The aortic valve is tricuspid. Aortic valve regurgitation is not  visualized. No aortic stenosis is present.   8. The inferior vena cava is dilated in size with >50% respiratory  variability, suggesting right atrial pressure of 8 mmHg.    Echo 07/2019: 1. Left ventricular ejection fraction, by estimation, is 50 to 55%. The  left ventricle has low normal function. The left ventricle has no regional  wall motion abnormalities. Left ventricular diastolic function could not  be evaluated.   2. Right ventricular systolic function is normal. The right ventricular  size is normal. There is moderately elevated pulmonary artery systolic  pressure.   3. Left atrial size was severely dilated.   4. Right atrial size was severely dilated.   5. The mitral valve is myxomatous. Moderate to severe mitral valve  regurgitation.   6. Tricuspid valve regurgitation is mild to moderate.   7. The aortic valve is normal in structure. Aortic valve regurgitation is  not  visualized. No aortic stenosis is present.   8. Aortic dilatation noted. 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    LHC 08/2019: 1.  Calcified nonobstructive proximal LAD stenosis 2.  Patent coronary arteries with mild diffuse irregularity and no flow-limiting coronary stenoses 3.  Normal right heart filling pressures with preserved cardiac output 4.  20 mm V wave consistent with the patient's known mitral regurgitation   Recommendation: Continued plans for cardiac surgical evaluation for mitral valve repair   Carotid Dopplers 09/2019: 1 to 39% ICA stenosis bilaterally.  EKG: No EKG today  Recent Labs:  08/14/2021: ALT 11; BUN 25; Creatinine 1.25; Hemoglobin 10.3; Platelet Count 326; Potassium 4.6; Sodium 141  Recent Lipid Panel    Component Value Date/Time   CHOL 166 03/03/2011 0922   TRIG 41.0 03/03/2011 0922   HDL 55.80 03/03/2011 0922   CHOLHDL 3 03/03/2011 0922   VLDL 8.2 03/03/2011 0922   LDLCALC 102 (H) 03/03/2011 0922   Risk Assessment/Calculations:   CHA2DS2-VASc Score = 2   This indicates a 2.2% annual risk of stroke. The patient's score is based upon: CHF History: 0 HTN History: 0 Diabetes History: 0 Stroke History: 0 Vascular Disease History: 1 Age Score: 1 Gender Score: 0   Home Medications   Current Meds  Medication Sig   apixaban (ELIQUIS) 5 MG TABS tablet Take 1 tablet (5 mg total) by mouth 2 (two) times daily.   digoxin (LANOXIN) 0.125 MG tablet Take 0.125 mg by mouth as directed. Take 2 tablets daily   fexofenadine (ALLEGRA) 180 MG tablet Take 180 mg by mouth daily as needed for allergies.   hydrocortisone cream 1 % Apply 1 application topically 2 (two) times daily as needed for itching.   Multiple Vitamins-Minerals (MULTIVITAMIN WITH MINERALS) tablet Take 1 tablet by mouth daily. [facility] men    polyethylene glycol (MIRALAX / GLYCOLAX) 17 g packet Take 17 g by mouth daily.   rosuvastatin (CRESTOR) 5 MG tablet Take 5 mg by mouth  daily.   sildenafil (VIAGRA) 100 MG tablet Take 100 mg by mouth as needed for erectile dysfunction.   zolpidem (AMBIEN) 5 MG tablet Take 5 mg by mouth at bedtime.   [DISCONTINUED] metoprolol tartrate (LOPRESSOR) 25 MG tablet 1 AND 1/2 TABLETS TWICE A DAY     Review of Systems      All other systems reviewed and are otherwise negative except as noted above.  Physical Exam    VS:  BP 110/76 (BP Location: Left Arm, Patient Position: Sitting, Cuff Size: Normal)   Pulse 72   Ht 6' 3"  (1.905 m)   Wt 173 lb 3.2 oz (78.6 kg)   SpO2 99%   BMI 21.65 kg/m  , BMI Body mass index is 21.65 kg/m.  Wt Readings from Last 3 Encounters:  09/09/21 173 lb 3.2 oz (78.6 kg)  08/14/21 175 lb 3.2 oz (79.5 kg)  04/08/21 174 lb 6.4 oz (79.1 kg)     GEN: Well nourished, well developed, in no acute distress. HEENT: normal. Neck: Supple, no JVD, carotid bruits, or masses. Cardiac: IRIR, no murmurs, rubs, or gallops. No clubbing, cyanosis, edema.  Radials/PT 2+ and equal bilaterally.  Respiratory:  Respirations regular and unlabored, clear to auscultation bilaterally. GI: Soft, nontender, nondistended. MS: No deformity or atrophy. Skin: Warm and dry, no rash. Neuro:  Strength and sensation are intact. Psych: Normal affect.  Assessment & Plan    Atrial fibrillation /hypercoagulable state/high risk medication use- Rate controlled atrial fib by auscultation today. Digoxin increased to 0.2m dose a little over a week ago with not much change in energy level, dyspnea. Update digoxin level (he did hold digoxin this morning). Recent 3 day monitor with average rate 90 bpm prior to medication change. 2 runs of NSVT one of which was triggered due to palpitations, forceful heartbeat but no chest pain, dizziness, syncope. Continue Metoprolol 273mBID - previously did not tolerate higher doses. CHA2DS2-VASc Score = 2 [CHF History: 0, HTN History: 0, Diabetes History: 0, Stroke History: 0, Vascular Disease History: 1,  Age Score: 1, Gender Score: 0].  Therefore,  the patient's annual risk of stroke is 2.2 %.   Continue Eliquis 37m BID. Denies hematuria, melena.   [facility] 04/27/20 ETT no ischemia. Notes exertional dyspnea but no chest discomfort. 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 However, if does not resolve with improvement in Hb consider cardiac CTA vs PET.   MDS - Follows with hematology. Upcoming labs.   Ascending aortic aneurysm -4.1 cm by echo 2021 and 2023.  Continue optimal blood pressure control.  Repeat echo 07/2022 for monitoring already ordered.   MR s/p minimally invasive mitral valve repair - Echo 03/2021 with MV repairfunctioning well.   Mild carotid stenosis - 09/2019 bilateral 1-39% stenosis. No amaurosis fugax, syncope. Continue statin.   Disposition: Follow up in 3 month(s) with TSkeet Latch MD or APP.  Signed, CLoel Dubonnet NP 09/09/2021, 8:41 AM [place]

## 2021-09-09 ENCOUNTER — Ambulatory Visit (INDEPENDENT_AMBULATORY_CARE_PROVIDER_SITE_OTHER): Payer: Medicare Other | Admitting: Family

## 2021-09-09 ENCOUNTER — Encounter (HOSPITAL_BASED_OUTPATIENT_CLINIC_OR_DEPARTMENT_OTHER): Payer: Self-pay | Admitting: Family

## 2021-09-09 VITALS — BP 110/76 | HR 72 | Ht 75.0 in | Wt 173.2 lb

## 2021-09-09 DIAGNOSIS — I4821 Permanent atrial fibrillation: Secondary | ICD-10-CM | POA: Diagnosis not present

## 2021-09-09 DIAGNOSIS — Z9889 Other specified postprocedural states: Secondary | ICD-10-CM

## 2021-09-09 DIAGNOSIS — I7121 Aneurysm of the ascending aorta, without rupture: Secondary | ICD-10-CM | POA: Diagnosis not present

## 2021-09-09 DIAGNOSIS — D6859 Other primary thrombophilia: Secondary | ICD-10-CM | POA: Diagnosis not present

## 2021-09-09 DIAGNOSIS — Z79899 Other long term (current) drug therapy: Secondary | ICD-10-CM

## 2021-09-09 DIAGNOSIS — I25118 Atherosclerotic heart disease of native coronary artery with other forms of angina pectoris: Secondary | ICD-10-CM

## 2021-09-09 MED ORDER — METOPROLOL TARTRATE 25 MG PO TABS: 25.0000 mg | ORAL_TABLET | Freq: Two times a day (BID) | ORAL | 1 refills | Status: DC

## 2021-09-09 NOTE — Patient Instructions (Signed)
Medication Instructions:  Continue your current medications.   *If you need a refill on your cardiac medications before your next appointment, please call your pharmacy*   Lab Work: Your physician recommends that you return for lab work today: BMP, magnesium, digoxin level  If you have labs (blood work) drawn today and your tests are completely normal, you will receive your results only by: [facility] (if you have MyChart) OR A paper copy in the mail If you have any lab test that is abnormal or we need to change your treatment, we will call you to review the results.   Testing/Procedures: None ordered today.  Your monitor showed atrial fibrillation 100% of the time with an average heart rate of 90 bpm which is at goal of less than 100 bpm. You did have two episodes of a fast heart beat in the bottom chamber of your heart that self resolved after about 10 beats.    Follow-Up: At [facility], you and your health needs are our priority.  As part of our continuing mission to provide you with exceptional heart care, we have created designated Provider Care Teams.  These Care Teams include your primary Cardiologist (physician) and Advanced Practice Providers (APPs -  Physician Assistants and Nurse Practitioners) who all work together to provide you with the care you need, when you need it.  We recommend signing up for the patient portal called "MyChart".  Sign up information is provided on this After Visit Summary.  MyChart is used to connect with patients for Virtual Visits (Telemedicine).  Patients are able to view lab/test results, encounter notes, upcoming appointments, etc.  Non-urgent messages can be sent to your provider as well.   To learn more about what you can do with MyChart, go to [email or web address].    Your next appointment:   3 month(s)  The format for your next appointment:   In Person  Provider:   [name], MD or Laurann Montana, NP    Other  Instructions  Heart Healthy Diet Recommendations: A low-salt diet is recommended. Meats should be grilled, baked, or boiled. Avoid fried foods. Focus on lean protein sources like fish or chicken with vegetables and fruits. The American Heart Association is a Microbiologist!  American Heart Association Diet and Lifeystyle Recommendations   Exercise recommendations: The American Heart Association recommends 150 minutes of moderate intensity exercise weekly. Try 30 minutes of moderate intensity exercise 4-5 times per week. This could include walking, jogging, or swimming.  Important Information About Sugar

## 2021-09-10 ENCOUNTER — Other Ambulatory Visit (HOSPITAL_BASED_OUTPATIENT_CLINIC_OR_DEPARTMENT_OTHER): Payer: Self-pay

## 2021-09-10 LAB — BASIC METABOLIC PANEL
BUN/Creatinine Ratio: 18 (ref 10–24)
BUN: 21 mg/dL (ref 8–27)
CO2: 25 mmol/L (ref 20–29)
Calcium: 9.6 mg/dL (ref 8.6–10.2)
Chloride: 105 mmol/L (ref 96–106)
Creatinine, Ser: 1.17 mg/dL (ref 0.76–1.27)
Glucose: 119 mg/dL — ABNORMAL HIGH (ref 70–99)
Potassium: 4.8 mmol/L (ref 3.5–5.2)
Sodium: 142 mmol/L (ref 134–144)
eGFR: 67 mL/min/{1.73_m2} (ref >59)

## 2021-09-10 LAB — DIGOXIN LEVEL: Digoxin, Serum: 0.8 ng/mL (ref 0.5–0.9)

## 2021-09-10 LAB — MAGNESIUM: Magnesium: 2.2 mg/dL (ref 1.6–2.3)

## 2021-09-10 MED ORDER — DIGOXIN 250 MCG PO TABS: 0.2500 mg | ORAL_TABLET | ORAL | 3 refills | Status: DC

## 2021-09-10 NOTE — Progress Notes (Signed)
Updated Rx sent to pharmacy

## 2021-09-14 IMAGING — CT CT ANGIO CHEST
2 of 7 series · 13 of 46 positions shown · non-contrast
Comparison: None.

CLINICAL DATA: 69-year-old with severe mitral regurgitation.
ordered for preoperative evaluation.

EXAM:
CT ANGIOGRAPHY CHEST, ABDOMEN AND PELVIS
TECHNIQUE: Non-contrast CT of the chest was initially obtained.

[Series 4: axial arterial · axial · arterial · 0.68mm/px · z∈[+1128,+1752]mm · 10 of 244 slices shown]
[im 18/244  lung]
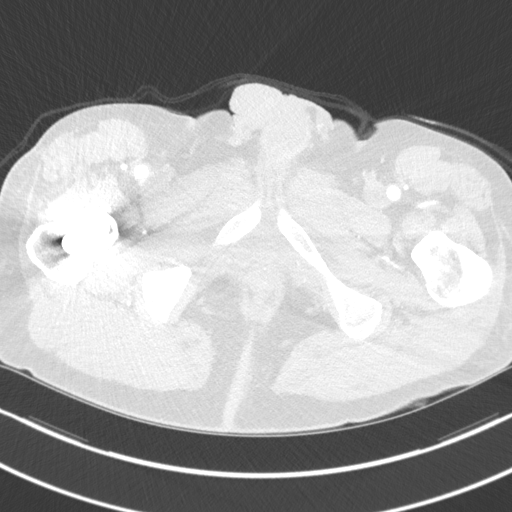
[im 35/244  soft-tissue]
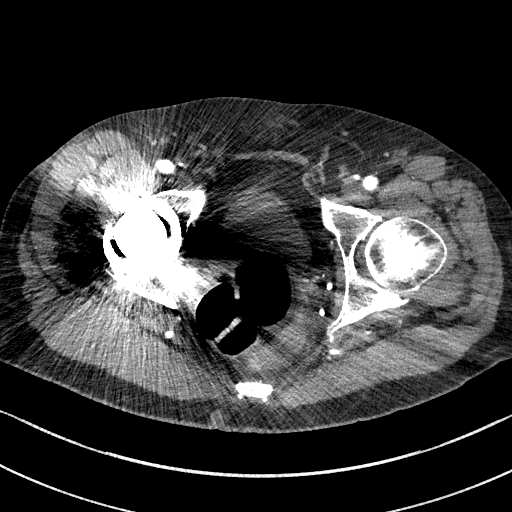
[im 70/244  lung]
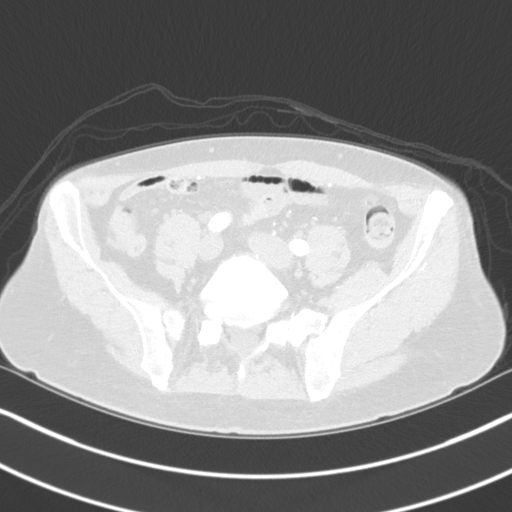
[im 87/244  soft-tissue]
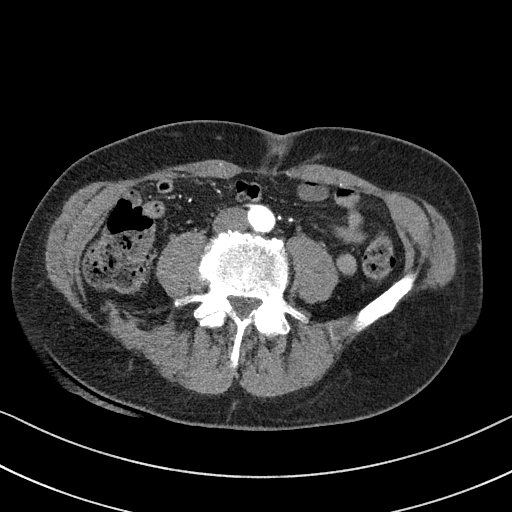
[im 105/244  lung]
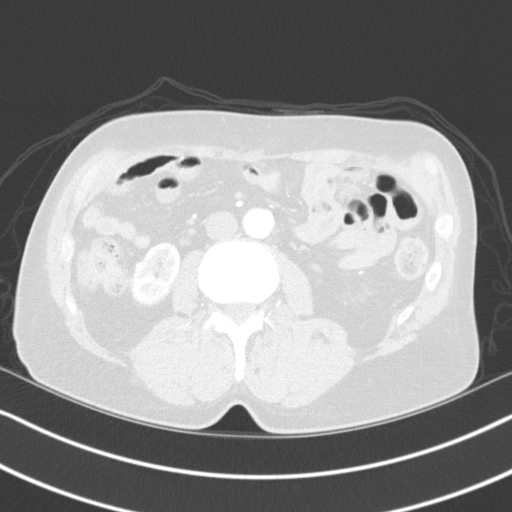
[im 139/244  soft-tissue]
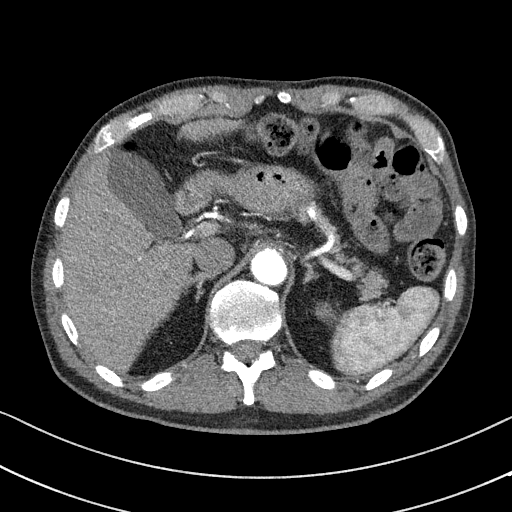
[im 157/244  lung]
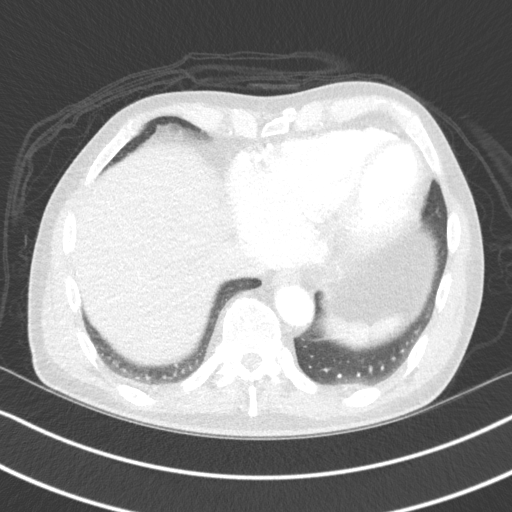
[im 174/244  soft-tissue]
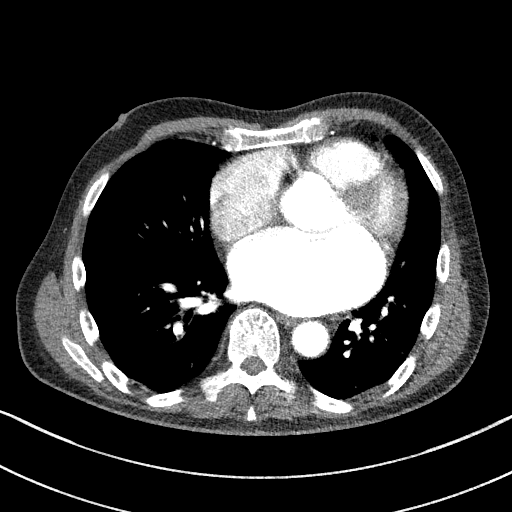
[im 209/244  lung]
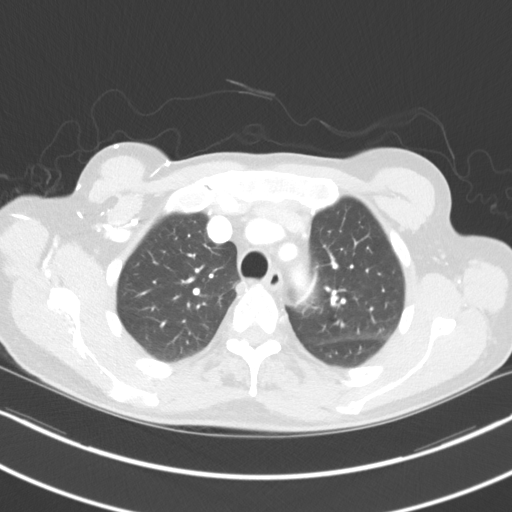
[im 226/244  soft-tissue]
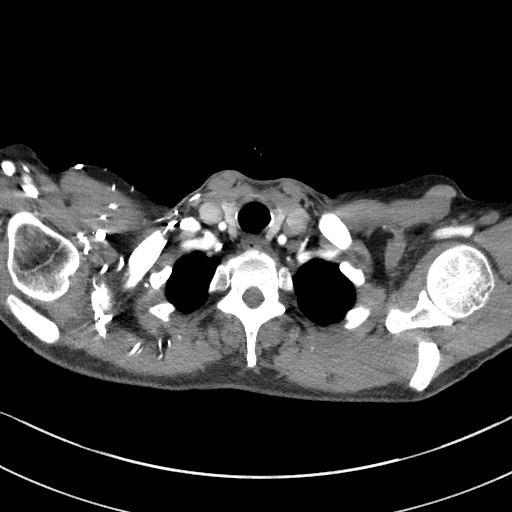

[Series 9: coronals · coronal · 0.70mm/px · 3 of 145 slices shown]
[im 37/145  soft-tissue]
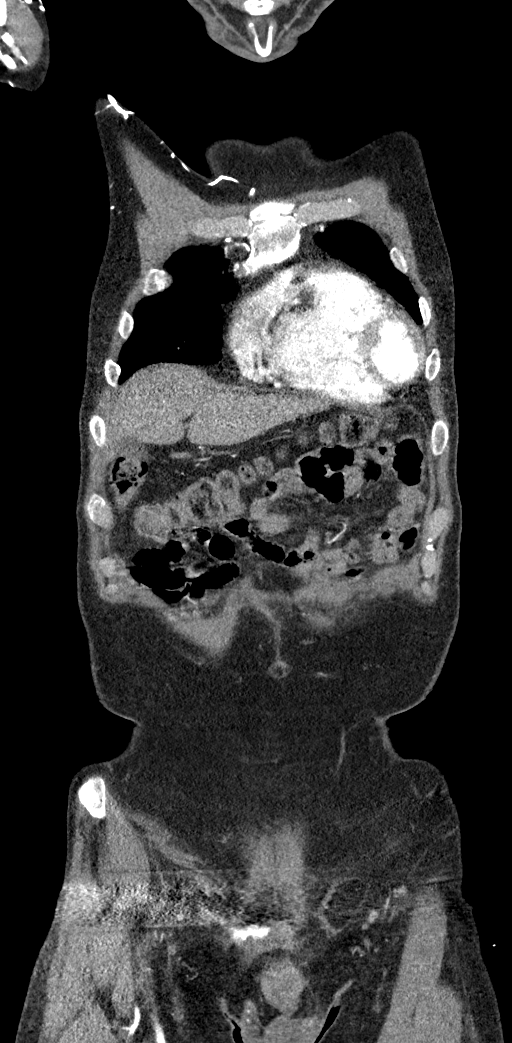
[im 73/145  soft-tissue]
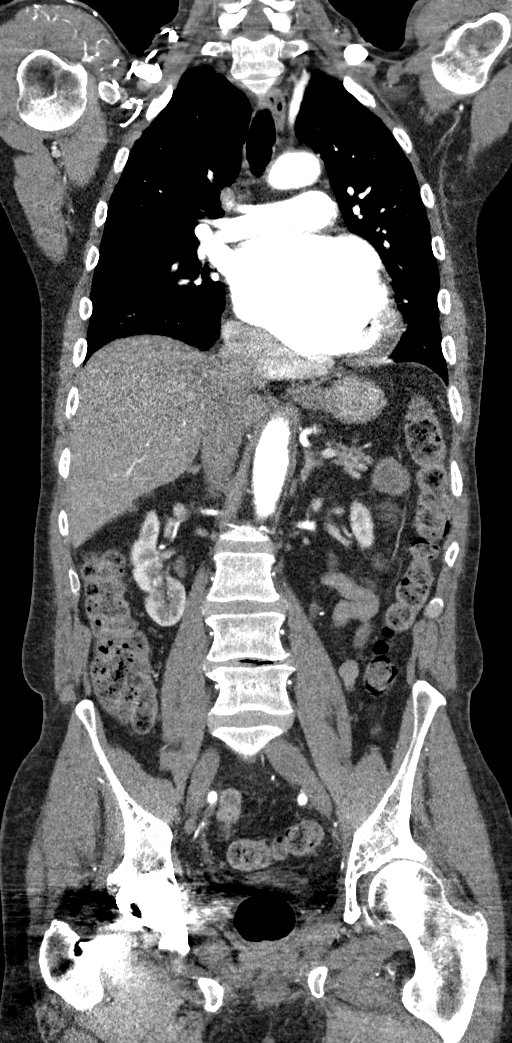
[im 109/145  soft-tissue]
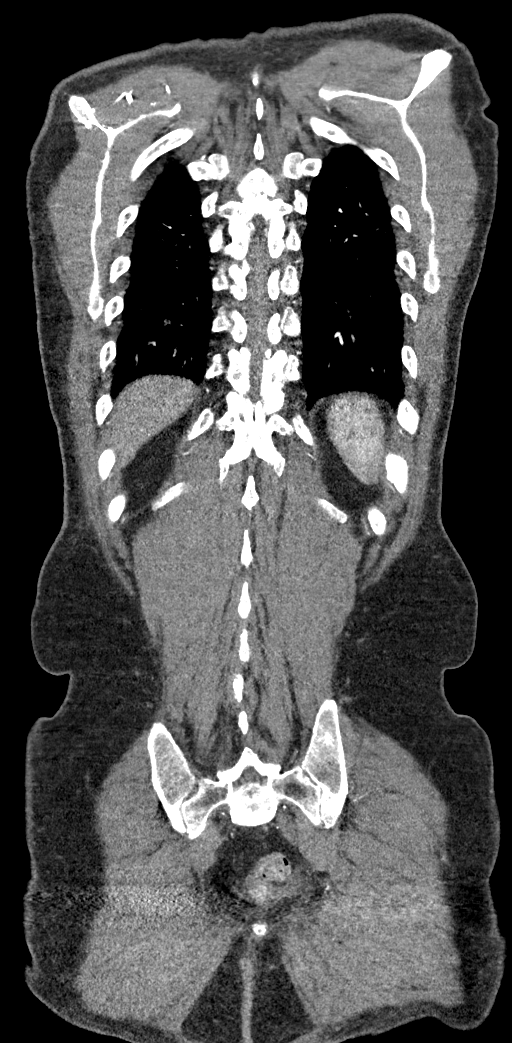

[13 of 46 positions shown; findings below may reference images not displayed]

Multidetector CT imaging through the chest, abdomen and pelvis was
performed using the standard protocol during bolus administration of
intravenous contrast. Multiplanar reconstructed images and MIPs were
obtained and reviewed to evaluate the vascular anatomy.

CONTRAST:  100mL OMNIPAQUE IOHEXOL 350 MG/ML SOLN
FINDINGS: CTA CHEST FINDINGS

Cardiovascular: Left atrium is enlarged with an AP dimension of
cm and compatible with history of mitral regurgitation. Ascending
thoracic aorta is mildly aneurysmal measuring up to 4.0 cm. Bovine
type aortic arch with a short trunk for the left common carotid
artery and brachiocephalic artery. Right vertebral artery is
dominant. Great vessels are patent. Bilateral subclavian and
proximal axillary arteries are patent. Proximal descending thoracic
aorta measures 2.7 cm. No significant atherosclerotic disease
involving the thoracic aorta. Negative for aortic dissection. Main
pulmonary arteries are patent. No significant pericardial fluid.
Coronary artery calcifications particularly in the LAD.

Mediastinum/Nodes: No mediastinal or hilar lymphadenopathy. No
axillary lymph node enlargement.

Lungs/Pleura: Trachea and mainstem bronchi are patent. No pleural
effusions. Lungs are clear. No consolidation or airspace disease.
Minimal scarring at the lung apices.

Musculoskeletal: No acute bone abnormality.

Review of the MIP images confirms the above findings.

CTA ABDOMEN AND PELVIS FINDINGS

VASCULAR

Aorta: Atherosclerotic calcifications involving the abdominal aorta
without aneurysm or dissection.

Celiac: Patent without evidence of aneurysm, dissection, vasculitis
or significant stenosis.

SMA: High-grade stenosis involving the proximal SMA related to
noncalcified plaque. The stenosis appears to be at least 70%. SMA is
patent distal to the high-grade stenosis. There is variant anatomy
with a replaced right hepatic artery coming off the proximal SMA.

Renals: Left renal artery is widely patent with mild atherosclerotic
disease. No evidence for aneurysm or dissection involving the left
renal artery. Atherosclerotic disease involving the proximal right
renal artery with less than 50% stenosis. Negative for dissection or
aneurysm involving the right renal artery. Small accessory right
renal artery supplying the lower pole and originates from the distal
abdominal aorta just above the bifurcation.

IMA: Patent.

Inflow: Common, external and internal iliac arteries are widely
patent without any significant atherosclerotic disease. No evidence
for dissection or aneurysm involving the iliac arteries.

Proximal outflow: Mild atherosclerotic disease in the left common
femoral artery. Proximal femoral arteries are widely patent.

Veins: No obvious venous abnormality within the limitations of this
arterial phase study.

Review of the MIP images confirms the above findings.

NON-VASCULAR

Hepatobiliary: 2.6 cm hypodensity along the inferior right hepatic
lobe measures 10 Hounsfield units. This likely represents an
exophytic hepatic cyst. Normal appearance of the gallbladder.

Pancreas: Unremarkable. No pancreatic ductal dilatation or
surrounding inflammatory changes.

Spleen: Normal in size without focal abnormality.

Adrenals/Urinary Tract: Normal adrenal glands. Small amount of fluid
in the urinary bladder. Limited evaluation of the distal right
ureter due to artifact from hip replacement. No suspicious renal
lesion or hydronephrosis. Small hypodensity in the right kidney
lower pole is too small to definitively characterize.

Stomach/Bowel: Stomach is decompressed. No evidence for bowel
obstruction or bowel inflammation.

Lymphatic: No lymph node enlargement in the abdomen or pelvis.

Reproductive: History of prostatectomy.

Other: Trace free fluid in the pelvis. Small inguinal hernias
containing fat.

Musculoskeletal: Right hip replacement is located. Degenerative
facet disease in lower lumbar spine.

Review of the MIP images confirms the above findings.
IMPRESSION: 1. Ascending thoracic aorta is mildly aneurysmal measuring up to
cm. No significant atherosclerotic disease in the thoracic aorta.
Recommend annual imaging followup by CTA or MRA. This recommendation
follows 7515 ACCF/AHA/AATS/ACR/ASA/SCA/ADALID/PICANOVA/ORJENT/FOREVAR Guidelines
for the Diagnosis and Management of Patients with Thoracic Aortic
Disease. Circulation. 7515; 121: E266-e369. Aortic aneurysm NOS
(R6757-WIV.X)
2. Mild atherosclerotic disease in the abdominal aorta without
aneurysm. Aortic Atherosclerosis (R6757-V1X.X).
3. Greater than 70% stenosis involving the proximal SMA related to
noncalcified plaque.
4. Bilateral iliac arteries are patent without significant
atherosclerotic disease or stenosis.
5. Enlarged left atrium and compatible with history of mitral
regurgitation.
6. Probable exophytic cyst in the right hepatic lobe measuring
roughly 2.6 cm.
7. Trace fluid in the pelvis of unknown etiology. No evidence for
acute inflammation in the abdomen or pelvis.

## 2021-09-14 IMAGING — CT CT CTA ABD/PEL W/CM AND/OR W/O CM
2 of 7 series · 11 of 46 positions shown, 12 images · IV contrast (OMNIPAQUE)
Comparison: None.

CLINICAL DATA: 69-year-old with severe mitral regurgitation.
ordered for preoperative evaluation.

EXAM:
CT ANGIOGRAPHY CHEST, ABDOMEN AND PELVIS
TECHNIQUE: Non-contrast CT of the chest was initially obtained.

[Series 4: axial arterial · axial · arterial · 0.68mm/px · z∈[+1128,+1752]mm · 8 of 244 slices shown, 9 images]
[im 18/244  soft-tissue]
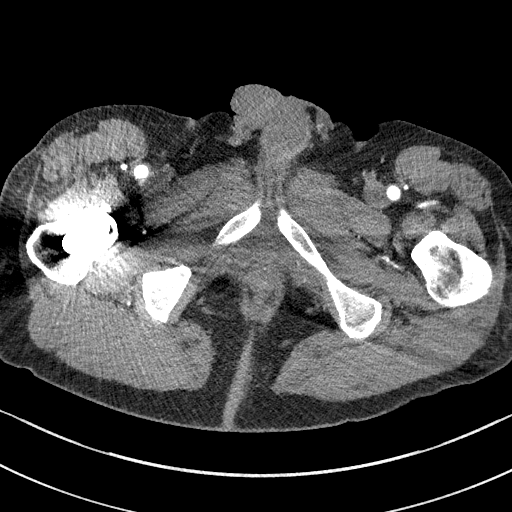
[im 18/244  bone]
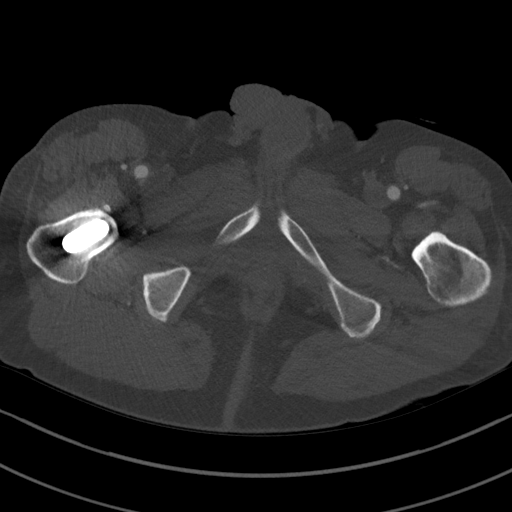
[im 53/244  soft-tissue]
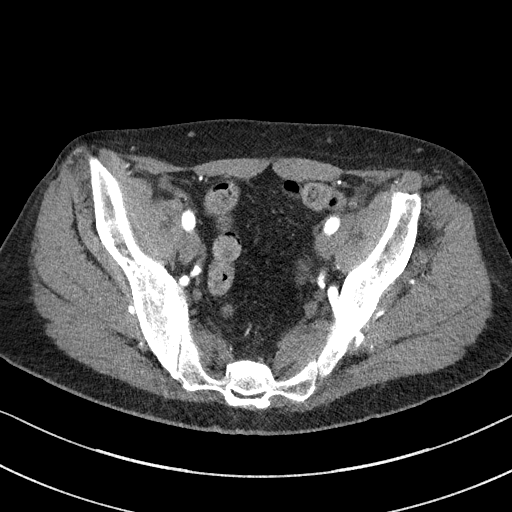
[im 87/244  soft-tissue]
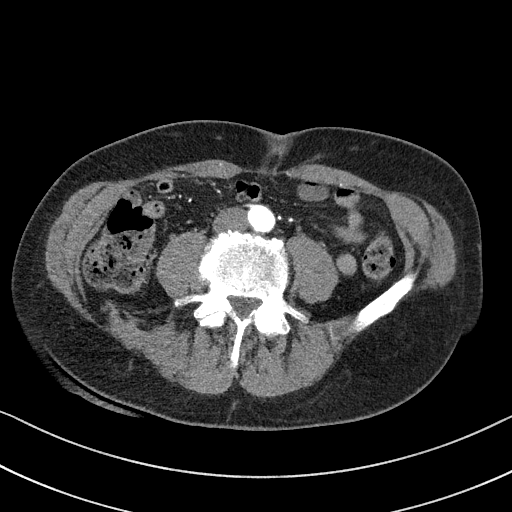
[im 105/244  soft-tissue]
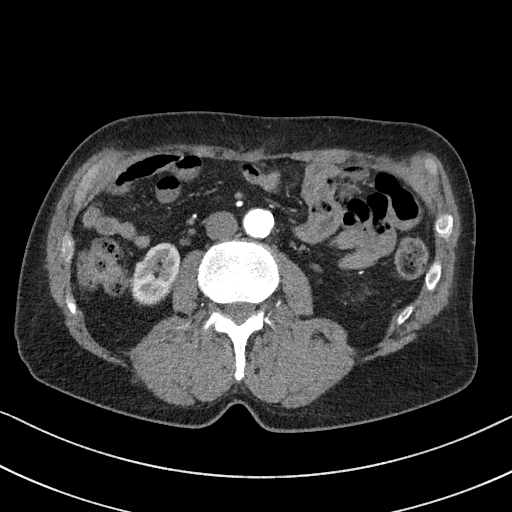
[im 139/244  soft-tissue]
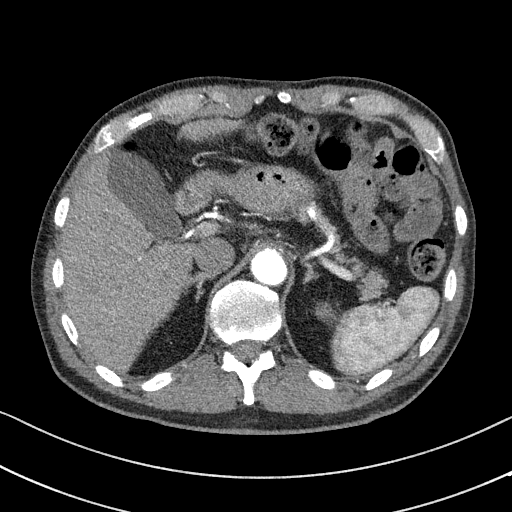
[im 157/244  soft-tissue]
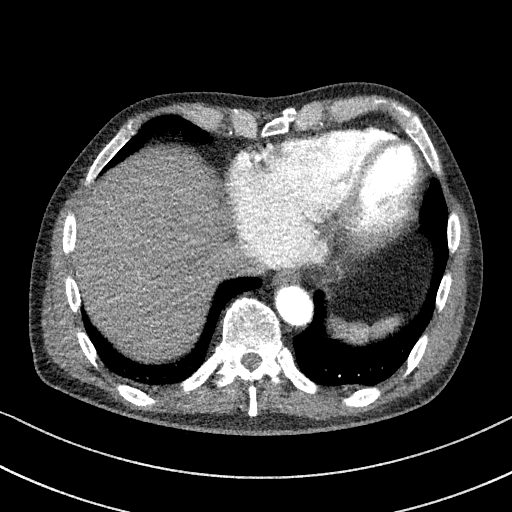
[im 191/244  soft-tissue]
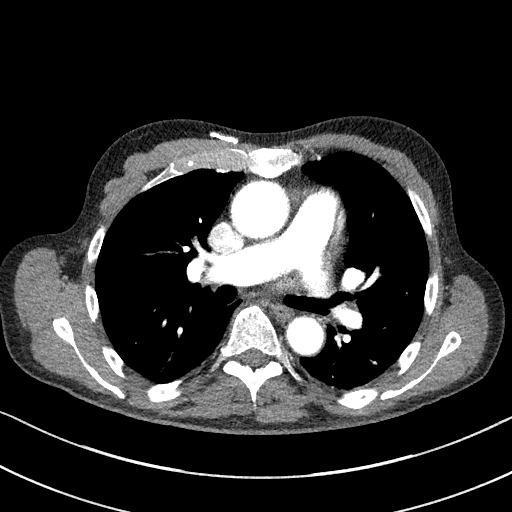
[im 226/244  soft-tissue]
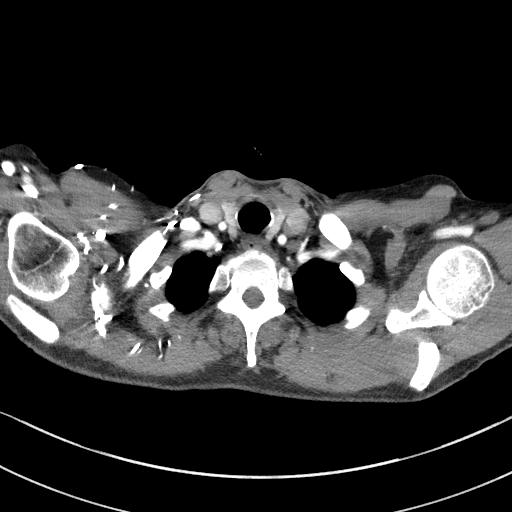

[Series 9: coronals · coronal · 0.70mm/px · 3 of 145 slices shown]
[im 37/145  soft-tissue]
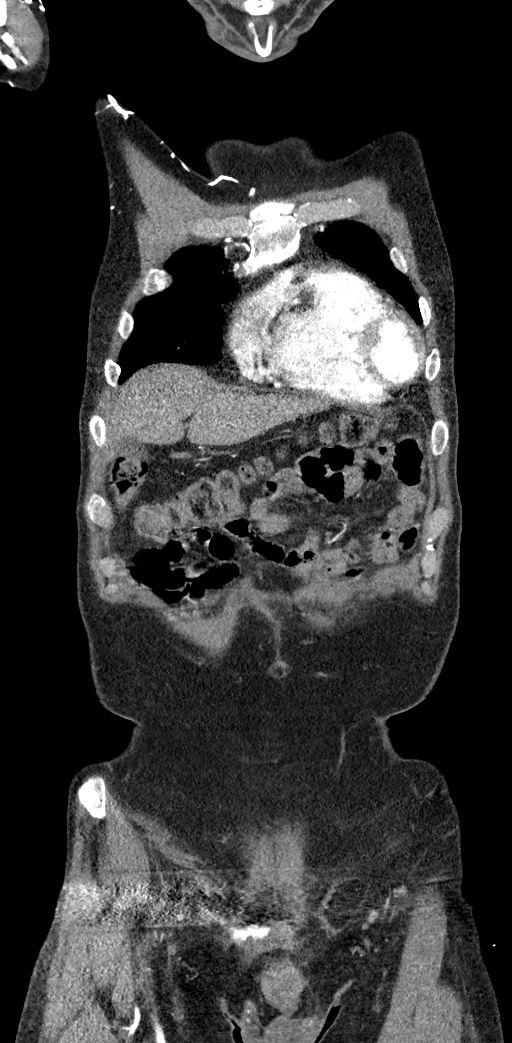
[im 73/145  soft-tissue]
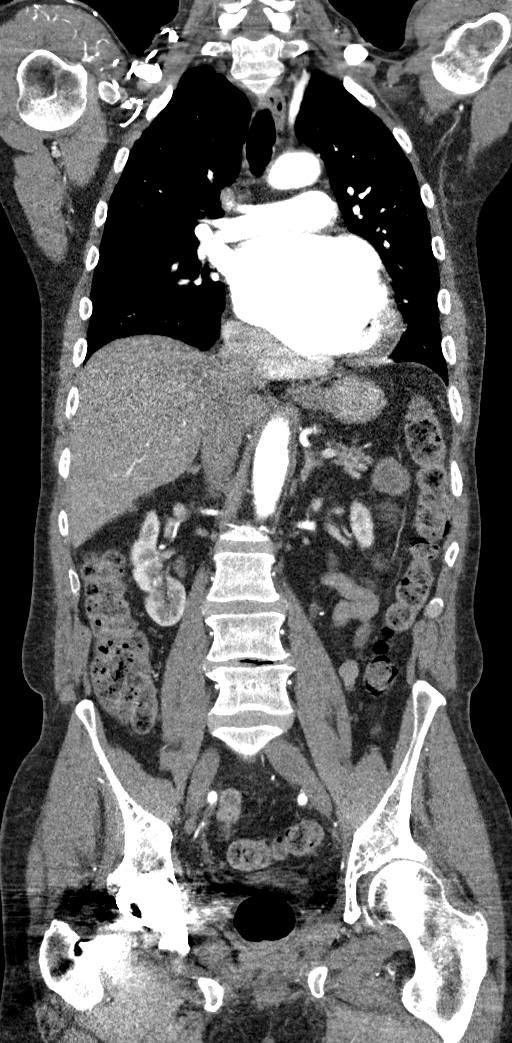
[im 109/145  soft-tissue]
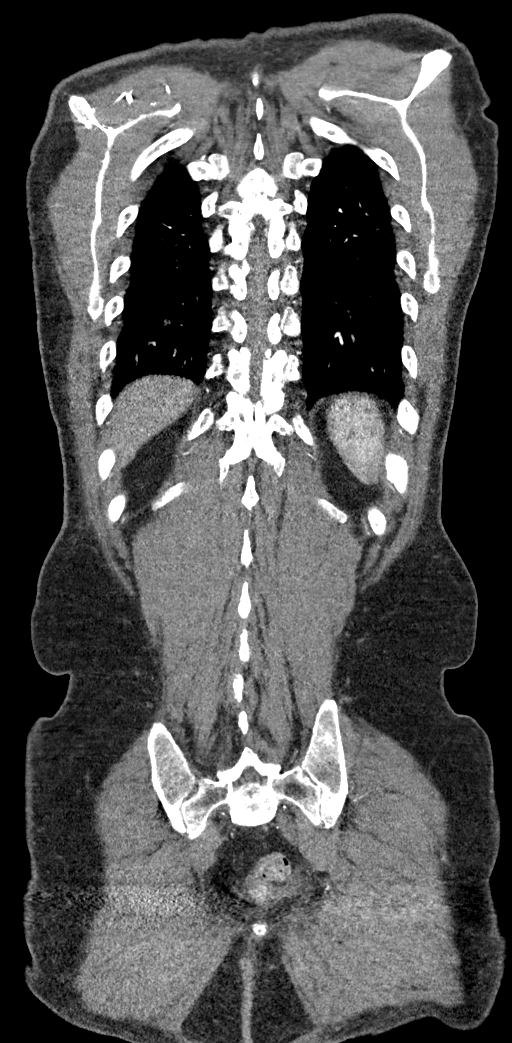

[11 of 46 positions shown; findings below may reference images not displayed]

Multidetector CT imaging through the chest, abdomen and pelvis was
performed using the standard protocol during bolus administration of
intravenous contrast. Multiplanar reconstructed images and MIPs were
obtained and reviewed to evaluate the vascular anatomy.

CONTRAST:  100mL OMNIPAQUE IOHEXOL 350 MG/ML SOLN
FINDINGS: CTA CHEST FINDINGS

Cardiovascular: Left atrium is enlarged with an AP dimension of
cm and compatible with history of mitral regurgitation. Ascending
thoracic aorta is mildly aneurysmal measuring up to 4.0 cm. Bovine
type aortic arch with a short trunk for the left common carotid
artery and brachiocephalic artery. Right vertebral artery is
dominant. Great vessels are patent. Bilateral subclavian and
proximal axillary arteries are patent. Proximal descending thoracic
aorta measures 2.7 cm. No significant atherosclerotic disease
involving the thoracic aorta. Negative for aortic dissection. Main
pulmonary arteries are patent. No significant pericardial fluid.
Coronary artery calcifications particularly in the LAD.

Mediastinum/Nodes: No mediastinal or hilar lymphadenopathy. No
axillary lymph node enlargement.

Lungs/Pleura: Trachea and mainstem bronchi are patent. No pleural
effusions. Lungs are clear. No consolidation or airspace disease.
Minimal scarring at the lung apices.

Musculoskeletal: No acute bone abnormality.

Review of the MIP images confirms the above findings.

CTA ABDOMEN AND PELVIS FINDINGS

VASCULAR

Aorta: Atherosclerotic calcifications involving the abdominal aorta
without aneurysm or dissection.

Celiac: Patent without evidence of aneurysm, dissection, vasculitis
or significant stenosis.

SMA: High-grade stenosis involving the proximal SMA related to
noncalcified plaque. The stenosis appears to be at least 70%. SMA is
patent distal to the high-grade stenosis. There is variant anatomy
with a replaced right hepatic artery coming off the proximal SMA.

Renals: Left renal artery is widely patent with mild atherosclerotic
disease. No evidence for aneurysm or dissection involving the left
renal artery. Atherosclerotic disease involving the proximal right
renal artery with less than 50% stenosis. Negative for dissection or
aneurysm involving the right renal artery. Small accessory right
renal artery supplying the lower pole and originates from the distal
abdominal aorta just above the bifurcation.

IMA: Patent.

Inflow: Common, external and internal iliac arteries are widely
patent without any significant atherosclerotic disease. No evidence
for dissection or aneurysm involving the iliac arteries.

Proximal outflow: Mild atherosclerotic disease in the left common
femoral artery. Proximal femoral arteries are widely patent.

Veins: No obvious venous abnormality within the limitations of this
arterial phase study.

Review of the MIP images confirms the above findings.

NON-VASCULAR

Hepatobiliary: 2.6 cm hypodensity along the inferior right hepatic
lobe measures 10 Hounsfield units. This likely represents an
exophytic hepatic cyst. Normal appearance of the gallbladder.

Pancreas: Unremarkable. No pancreatic ductal dilatation or
surrounding inflammatory changes.

Spleen: Normal in size without focal abnormality.

Adrenals/Urinary Tract: Normal adrenal glands. Small amount of fluid
in the urinary bladder. Limited evaluation of the distal right
ureter due to artifact from hip replacement. No suspicious renal
lesion or hydronephrosis. Small hypodensity in the right kidney
lower pole is too small to definitively characterize.

Stomach/Bowel: Stomach is decompressed. No evidence for bowel
obstruction or bowel inflammation.

Lymphatic: No lymph node enlargement in the abdomen or pelvis.

Reproductive: History of prostatectomy.

Other: Trace free fluid in the pelvis. Small inguinal hernias
containing fat.

Musculoskeletal: Right hip replacement is located. Degenerative
facet disease in lower lumbar spine.

Review of the MIP images confirms the above findings.
IMPRESSION: 1. Ascending thoracic aorta is mildly aneurysmal measuring up to
cm. No significant atherosclerotic disease in the thoracic aorta.
Recommend annual imaging followup by CTA or MRA. This recommendation
follows 7515 ACCF/AHA/AATS/ACR/ASA/SCA/ADALID/PICANOVA/ORJENT/FOREVAR Guidelines
for the Diagnosis and Management of Patients with Thoracic Aortic
Disease. Circulation. 7515; 121: E266-e369. Aortic aneurysm NOS
(R6757-WIV.X)
2. Mild atherosclerotic disease in the abdominal aorta without
aneurysm. Aortic Atherosclerosis (R6757-V1X.X).
3. Greater than 70% stenosis involving the proximal SMA related to
noncalcified plaque.
4. Bilateral iliac arteries are patent without significant
atherosclerotic disease or stenosis.
5. Enlarged left atrium and compatible with history of mitral
regurgitation.
6. Probable exophytic cyst in the right hepatic lobe measuring
roughly 2.6 cm.
7. Trace fluid in the pelvis of unknown etiology. No evidence for
acute inflammation in the abdomen or pelvis.

## 2021-09-18 ENCOUNTER — Inpatient Hospital Stay: Payer: Medicare Other | Attending: Hematology and Oncology

## 2021-09-18 ENCOUNTER — Other Ambulatory Visit: Payer: Self-pay | Admitting: Hematology and Oncology

## 2021-09-18 ENCOUNTER — Other Ambulatory Visit: Payer: Self-pay

## 2021-09-18 DIAGNOSIS — D539 Nutritional anemia, unspecified: Secondary | ICD-10-CM

## 2021-09-18 DIAGNOSIS — E7489 Other specified disorders of carbohydrate metabolism: Secondary | ICD-10-CM | POA: Diagnosis not present

## 2021-09-18 DIAGNOSIS — D4620 Refractory anemia with excess of blasts, unspecified: Secondary | ICD-10-CM | POA: Insufficient documentation

## 2021-09-18 DIAGNOSIS — R5383 Other fatigue: Secondary | ICD-10-CM | POA: Diagnosis not present

## 2021-09-18 DIAGNOSIS — G629 Polyneuropathy, unspecified: Secondary | ICD-10-CM | POA: Diagnosis not present

## 2021-09-18 LAB — CBC WITH DIFFERENTIAL (CANCER CENTER ONLY)
Abs Immature Granulocytes: 0.02 10*3/uL (ref 0.00–0.07)
Basophils Absolute: 0.1 10*3/uL (ref 0.0–0.1)
Basophils Relative: 1 %
Eosinophils Absolute: 0.2 10*3/uL (ref 0.0–0.5)
Eosinophils Relative: 3 %
HCT: 32.6 % — ABNORMAL LOW (ref 39.0–52.0)
Hemoglobin: 11.1 g/dL — ABNORMAL LOW (ref 13.0–17.0)
Immature Granulocytes: 0 %
Lymphocytes Relative: 17 %
Lymphs Abs: 1.2 10*3/uL (ref 0.7–4.0)
MCH: 36.6 pg — ABNORMAL HIGH (ref 26.0–34.0)
MCHC: 34 g/dL (ref 30.0–36.0)
MCV: 107.6 fL — ABNORMAL HIGH (ref 80.0–100.0)
Monocytes Absolute: 0.7 10*3/uL (ref 0.1–1.0)
Monocytes Relative: 9 %
Neutro Abs: 4.9 10*3/uL (ref 1.7–7.7)
Neutrophils Relative %: 70 %
Platelet Count: 340 10*3/uL (ref 150–400)
RBC: 3.03 MIL/uL — ABNORMAL LOW (ref 4.22–5.81)
RDW: 16.3 % — ABNORMAL HIGH (ref 11.5–15.5)
WBC Count: 7.1 10*3/uL (ref 4.0–10.5)
nRBC: 0.3 % — ABNORMAL HIGH (ref 0.0–0.2)

## 2021-09-18 LAB — CMP (CANCER CENTER ONLY)
ALT: 12 U/L (ref 0–44)
AST: 15 U/L (ref 15–41)
Albumin: 4.3 g/dL (ref 3.5–5.0)
Alkaline Phosphatase: 50 U/L (ref 38–126)
Anion gap: 3 — ABNORMAL LOW (ref 5–15)
BUN: 24 mg/dL — ABNORMAL HIGH (ref 8–23)
CO2: 32 mmol/L (ref 22–32)
Calcium: 9.3 mg/dL (ref 8.9–10.3)
Chloride: 107 mmol/L (ref 98–111)
Creatinine: 1.24 mg/dL (ref 0.61–1.24)
GFR, Estimated: >60 mL/min (ref >60)
Glucose, Bld: 113 mg/dL — ABNORMAL HIGH (ref 70–99)
Potassium: 4.4 mmol/L (ref 3.5–5.1)
Sodium: 142 mmol/L (ref 135–145)
Total Bilirubin: 1.3 mg/dL — ABNORMAL HIGH (ref 0.3–1.2)
Total Protein: 6.7 g/dL (ref 6.5–8.1)

## 2021-09-20 ENCOUNTER — Telehealth: Payer: Self-pay | Admitting: Family

## 2021-09-20 MED ORDER — DIGOXIN 250 MCG PO TABS: 0.2500 mg | ORAL_TABLET | ORAL | 3 refills | Status: DC

## 2021-09-20 MED ORDER — DIGOXIN 250 MCG PO TABS: 0.2500 mg | ORAL_TABLET | Freq: Every day | ORAL | 3 refills | Status: DC

## 2021-09-20 NOTE — Telephone Encounter (Signed)
Pt called stating that Raymond Montana, NP was going to up the dosage on his digoxin. Please advise  He also states that they talked about a possible heart cath and he would like to proceed.

## 2021-09-20 NOTE — Telephone Encounter (Signed)
Left message to call back.  Nurse updated digoxin dose to 0.25 mg (1 tablet) daily as instructed by NP.

## 2021-09-23 NOTE — Telephone Encounter (Signed)
I saw that yall addressed digoxi, was there anything to address in regards to a cath?

## 2021-09-23 NOTE — Telephone Encounter (Signed)
Left message for patient to call back and sent mychart message.    "Per my last clinic visit "Notes exertional dyspnea but no chest discomfort. 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 However, if does not resolve with improvement in Hb consider cardiac CTA vs PET". Will need office visit to discuss with me or Dr. Oval Linsey.    Loel Dubonnet, NP"

## 2021-09-23 NOTE — Telephone Encounter (Signed)
Per my last clinic visit "Notes exertional dyspnea but no chest discomfort. 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 However, if does not resolve with improvement in Hb consider cardiac CTA vs PET". Will need office visit to discuss with me or Dr. Oval Linsey.   Loel Dubonnet, NP

## 2021-09-24 ENCOUNTER — Telehealth: Payer: Self-pay | Admitting: Cardiovascular Disease

## 2021-09-24 DIAGNOSIS — I25118 Atherosclerotic heart disease of native coronary artery with other forms of angina pectoris: Secondary | ICD-10-CM

## 2021-09-24 DIAGNOSIS — R0609 Other forms of dyspnea: Secondary | ICD-10-CM

## 2021-09-24 DIAGNOSIS — I4821 Permanent atrial fibrillation: Secondary | ICD-10-CM

## 2021-09-24 NOTE — Telephone Encounter (Signed)
Given his atrial fibrillation a cardiac PET scan will give [place] better images of his heart arteries. Okay to order cardiac PET.   Raymond Dubonnet, NP

## 2021-09-24 NOTE — Telephone Encounter (Signed)
Spoke with patient and he would like to proceed with cardiac CT Wants to just rule out the shortness of breath is not coming from the heart No worsening in symptoms  Will forward to Overton Mam NP for review

## 2021-09-24 NOTE — Telephone Encounter (Signed)
Patient called stating when he last saw Dell Ponto there was discussion about getting a CT done.  However he said it would have to get the okay from Dr. Oval Linsey. He would like to have the CT of his arteries done.

## 2021-09-25 NOTE — Telephone Encounter (Signed)
Orders placed and mychart message sent with pet instructions,    "Given his atrial fibrillation a cardiac PET scan will give [place] better images of his heart arteries. Okay to order cardiac PET.    Loel Dubonnet, NP "

## 2021-09-30 DIAGNOSIS — M1712 Unilateral primary osteoarthritis, left knee: Secondary | ICD-10-CM | POA: Diagnosis not present

## 2021-09-30 DIAGNOSIS — Z96643 Presence of artificial hip joint, bilateral: Secondary | ICD-10-CM | POA: Diagnosis not present

## 2021-10-01 DIAGNOSIS — S76192A Other specified injury of left quadriceps muscle, fascia and tendon, initial encounter: Secondary | ICD-10-CM | POA: Diagnosis not present

## 2021-10-01 DIAGNOSIS — M1712 Unilateral primary osteoarthritis, left knee: Secondary | ICD-10-CM | POA: Diagnosis not present

## 2021-10-03 DIAGNOSIS — M25562 Pain in left knee: Secondary | ICD-10-CM | POA: Diagnosis not present

## 2021-10-04 ENCOUNTER — Other Ambulatory Visit: Payer: Medicare Other

## 2021-10-04 ENCOUNTER — Ambulatory Visit: Payer: Medicare Other | Admitting: Hematology and Oncology

## 2021-10-07 ENCOUNTER — Inpatient Hospital Stay: Payer: Medicare Other | Attending: Hematology and Oncology

## 2021-10-07 ENCOUNTER — Other Ambulatory Visit: Payer: Medicare Other

## 2021-10-07 ENCOUNTER — Ambulatory Visit: Payer: Medicare Other | Admitting: Hematology and Oncology

## 2021-10-07 ENCOUNTER — Other Ambulatory Visit: Payer: Self-pay | Admitting: Physician Assistant

## 2021-10-07 DIAGNOSIS — D4620 Refractory anemia with excess of blasts, unspecified: Secondary | ICD-10-CM | POA: Diagnosis not present

## 2021-10-07 DIAGNOSIS — M1712 Unilateral primary osteoarthritis, left knee: Secondary | ICD-10-CM | POA: Diagnosis not present

## 2021-10-07 DIAGNOSIS — D539 Nutritional anemia, unspecified: Secondary | ICD-10-CM

## 2021-10-07 LAB — CMP (CANCER CENTER ONLY)
ALT: 12 U/L (ref 0–44)
AST: 16 U/L (ref 15–41)
Albumin: 4.2 g/dL (ref 3.5–5.0)
Alkaline Phosphatase: 49 U/L (ref 38–126)
Anion gap: 4 — ABNORMAL LOW (ref 5–15)
BUN: 20 mg/dL (ref 8–23)
CO2: 27 mmol/L (ref 22–32)
Calcium: 9.2 mg/dL (ref 8.9–10.3)
Chloride: 108 mmol/L (ref 98–111)
Creatinine: 1.12 mg/dL (ref 0.61–1.24)
GFR, Estimated: >60 mL/min (ref >60)
Glucose, Bld: 137 mg/dL — ABNORMAL HIGH (ref 70–99)
Potassium: 4.4 mmol/L (ref 3.5–5.1)
Sodium: 139 mmol/L (ref 135–145)
Total Bilirubin: 1.4 mg/dL — ABNORMAL HIGH (ref 0.3–1.2)
Total Protein: 6.6 g/dL (ref 6.5–8.1)

## 2021-10-07 LAB — CBC WITH DIFFERENTIAL (CANCER CENTER ONLY)
Abs Immature Granulocytes: 0.02 10*3/uL (ref 0.00–0.07)
Basophils Absolute: 0 10*3/uL (ref 0.0–0.1)
Basophils Relative: 1 %
Eosinophils Absolute: 0.3 10*3/uL (ref 0.0–0.5)
Eosinophils Relative: 5 %
HCT: 30.7 % — ABNORMAL LOW (ref 39.0–52.0)
Hemoglobin: 10.7 g/dL — ABNORMAL LOW (ref 13.0–17.0)
Immature Granulocytes: 0 %
Lymphocytes Relative: 21 %
Lymphs Abs: 1.2 10*3/uL (ref 0.7–4.0)
MCH: 37.4 pg — ABNORMAL HIGH (ref 26.0–34.0)
MCHC: 34.9 g/dL (ref 30.0–36.0)
MCV: 107.3 fL — ABNORMAL HIGH (ref 80.0–100.0)
Monocytes Absolute: 0.4 10*3/uL (ref 0.1–1.0)
Monocytes Relative: 8 %
Neutro Abs: 3.6 10*3/uL (ref 1.7–7.7)
Neutrophils Relative %: 65 %
Platelet Count: 395 10*3/uL (ref 150–400)
RBC: 2.86 MIL/uL — ABNORMAL LOW (ref 4.22–5.81)
RDW: 17 % — ABNORMAL HIGH (ref 11.5–15.5)
WBC Count: 5.5 10*3/uL (ref 4.0–10.5)
nRBC: 0 % (ref 0.0–0.2)

## 2021-10-07 LAB — LACTATE DEHYDROGENASE: LDH: 160 U/L (ref 98–192)

## 2021-10-09 ENCOUNTER — Telehealth: Payer: Self-pay | Admitting: *Deleted

## 2021-10-09 NOTE — Telephone Encounter (Signed)
-----   Message from Lincoln Brigham, PA-C sent at 10/09/2021  8:36 AM EDT ----- Please notify patient that labs look stable from prior. No intervention required at this time.    ----- Message ----- From: Buel Ream, [facility] [place] Sent: 10/07/2021  10:57 AM EDT To: Lincoln Brigham, PA-C

## 2021-10-09 NOTE — Telephone Encounter (Signed)
TCT patient regarding recent lab results. Spoke with him. Advised that his labs remain stable compared to prior labs. No intervention at this time. He is aware of his future appts.

## 2021-10-21 DIAGNOSIS — H2513 Age-related nuclear cataract, bilateral: Secondary | ICD-10-CM | POA: Diagnosis not present

## 2021-10-21 DIAGNOSIS — M25562 Pain in left knee: Secondary | ICD-10-CM | POA: Diagnosis not present

## 2021-10-21 DIAGNOSIS — H5213 Myopia, bilateral: Secondary | ICD-10-CM | POA: Diagnosis not present

## 2021-10-25 ENCOUNTER — Encounter: Payer: Self-pay | Admitting: Hematology and Oncology

## 2021-10-25 ENCOUNTER — Other Ambulatory Visit: Payer: Self-pay | Admitting: *Deleted

## 2021-10-25 DIAGNOSIS — D539 Nutritional anemia, unspecified: Secondary | ICD-10-CM

## 2021-10-25 DIAGNOSIS — C61 Malignant neoplasm of prostate: Secondary | ICD-10-CM

## 2021-10-29 ENCOUNTER — Inpatient Hospital Stay: Payer: Medicare Other | Attending: Hematology and Oncology

## 2021-10-29 ENCOUNTER — Telehealth: Payer: Self-pay

## 2021-10-29 ENCOUNTER — Other Ambulatory Visit: Payer: Self-pay

## 2021-10-29 DIAGNOSIS — Z8546 Personal history of malignant neoplasm of prostate: Secondary | ICD-10-CM | POA: Diagnosis not present

## 2021-10-29 DIAGNOSIS — D4620 Refractory anemia with excess of blasts, unspecified: Secondary | ICD-10-CM | POA: Insufficient documentation

## 2021-10-29 DIAGNOSIS — D539 Nutritional anemia, unspecified: Secondary | ICD-10-CM

## 2021-10-29 DIAGNOSIS — C61 Malignant neoplasm of prostate: Secondary | ICD-10-CM

## 2021-10-29 LAB — CBC WITH DIFFERENTIAL (CANCER CENTER ONLY)
Abs Immature Granulocytes: 0.01 10*3/uL (ref 0.00–0.07)
Basophils Absolute: 0 10*3/uL (ref 0.0–0.1)
Basophils Relative: 1 %
Eosinophils Absolute: 0.4 10*3/uL (ref 0.0–0.5)
Eosinophils Relative: 7 %
HCT: 32.5 % — ABNORMAL LOW (ref 39.0–52.0)
Hemoglobin: 11.3 g/dL — ABNORMAL LOW (ref 13.0–17.0)
Immature Granulocytes: 0 %
Lymphocytes Relative: 20 %
Lymphs Abs: 1.1 10*3/uL (ref 0.7–4.0)
MCH: 36.8 pg — ABNORMAL HIGH (ref 26.0–34.0)
MCHC: 34.8 g/dL (ref 30.0–36.0)
MCV: 105.9 fL — ABNORMAL HIGH (ref 80.0–100.0)
Monocytes Absolute: 0.6 10*3/uL (ref 0.1–1.0)
Monocytes Relative: 10 %
Neutro Abs: 3.5 10*3/uL (ref 1.7–7.7)
Neutrophils Relative %: 62 %
Platelet Count: 265 10*3/uL (ref 150–400)
RBC: 3.07 MIL/uL — ABNORMAL LOW (ref 4.22–5.81)
RDW: 17.5 % — ABNORMAL HIGH (ref 11.5–15.5)
WBC Count: 5.6 10*3/uL (ref 4.0–10.5)
nRBC: 0.4 % — ABNORMAL HIGH (ref 0.0–0.2)

## 2021-10-29 LAB — CEA (IN HOUSE-CHCC): CEA (CHCC-In House): 2.78 ng/mL (ref 0.00–5.00)

## 2021-10-29 LAB — LACTATE DEHYDROGENASE: LDH: 159 U/L (ref 98–192)

## 2021-10-29 NOTE — Telephone Encounter (Signed)
Patient requested to speak with RN. Patient provided with copy of today's CBC. Reviewed next apt with Dr. Lorenso Courier is December.

## 2021-10-30 LAB — PROSTATE-SPECIFIC AG, SERUM (LABCORP): Prostate Specific Ag, Serum: <0.1 ng/mL (ref 0.0–4.0)

## 2021-11-14 ENCOUNTER — Other Ambulatory Visit: Payer: Medicare Other

## 2021-11-22 ENCOUNTER — Telehealth (HOSPITAL_COMMUNITY): Payer: Self-pay | Admitting: Emergency Medicine

## 2021-11-22 ENCOUNTER — Telehealth (HOSPITAL_BASED_OUTPATIENT_CLINIC_OR_DEPARTMENT_OTHER): Payer: Self-pay

## 2021-11-22 NOTE — Addendum Note (Signed)
Addended by: Loel Dubonnet on: 11/22/2021 09:57 AM   Modules accepted: Orders

## 2021-11-22 NOTE — Telephone Encounter (Addendum)
Routing to Laurann Montana, NP for Cardiac Pet attestation   ----- Message from Lorenza Evangelist, RN sent at 11/22/2021  7:57 AM EDT ----- [facility] team, This patient is scheduled for cardiac PET Tuesday and we need an attestation for this exam.  Thank you, Marchia Bond

## 2021-11-22 NOTE — Telephone Encounter (Signed)
PET attestation entered in chart.   Raymond Dubonnet, NP

## 2021-11-22 NOTE — Telephone Encounter (Signed)
Reaching out to patient to offer assistance regarding upcoming cardiac imaging study; pt verbalizes understanding of appt date/time, parking situation and where to check in, pre-test NPO status and medications ordered, and verified current allergies; name and call back number provided for further questions should they arise Daizy Outen RN Navigator Cardiac Imaging [facility] [phone] office [phone] cell 

## 2021-11-25 DIAGNOSIS — M1712 Unilateral primary osteoarthritis, left knee: Secondary | ICD-10-CM | POA: Diagnosis not present

## 2021-11-26 ENCOUNTER — Encounter (HOSPITAL_COMMUNITY)
Admission: RE | Admit: 2021-11-26 | Discharge: 2021-11-26 | Disposition: A | Discharge status: Home / Self Care | Payer: Medicare Other | Source: Ambulatory Visit | Attending: Family | Admitting: Family

## 2021-11-26 DIAGNOSIS — R0609 Other forms of dyspnea: Secondary | ICD-10-CM | POA: Diagnosis not present

## 2021-11-26 DIAGNOSIS — I4821 Permanent atrial fibrillation: Secondary | ICD-10-CM | POA: Diagnosis not present

## 2021-11-26 DIAGNOSIS — I25118 Atherosclerotic heart disease of native coronary artery with other forms of angina pectoris: Secondary | ICD-10-CM | POA: Diagnosis not present

## 2021-11-26 LAB — NM PET CT CARDIAC PERFUSION MULTI W/ABSOLUTE BLOODFLOW
LV dias vol: 106 mL (ref 62–150)
LV sys vol: 23 mL
MBFR: 2.35
Rest MBF: 0.85 ml/g/min
Rest Nuclear Isotope Dose: 20.4 mCi
ST Depression (mm): 0 mm
Stress MBF: 2 ml/g/min
Stress Nuclear Isotope Dose: 20.4 mCi
TID: 0.8

## 2021-11-26 MED ORDER — RUBIDIUM RB82 GENERATOR (RUBYFILL)
20.4000 | PACK | Freq: Once | INTRAVENOUS | Status: AC
  Administered 2021-11-26: 20.4 via INTRAVENOUS

## 2021-11-26 MED ORDER — REGADENOSON 0.4 MG/5ML IV SOLN: 0.4000 mg | Freq: Once | INTRAVENOUS | Status: AC

## 2021-11-26 MED ORDER — REGADENOSON 0.4 MG/5ML IV SOLN
INTRAVENOUS | Status: AC
  Administered 2021-11-26: 0.4 mg via INTRAVENOUS
  Filled 2021-11-26: qty 5

## 2021-11-26 NOTE — Progress Notes (Signed)
Patient tolerated exam without incident. Pt ambulatory to waiting room with steady gait. Caffeinated soda in hand. Marchia Bond RN

## 2021-11-27 ENCOUNTER — Encounter (HOSPITAL_BASED_OUTPATIENT_CLINIC_OR_DEPARTMENT_OTHER): Payer: Self-pay

## 2021-11-27 ENCOUNTER — Encounter (HOSPITAL_BASED_OUTPATIENT_CLINIC_OR_DEPARTMENT_OTHER): Payer: Self-pay | Admitting: Cardiovascular Disease

## 2021-11-27 NOTE — Telephone Encounter (Signed)
Dr Oval Linsey out of the office this week. Patient sent Dr Oval Linsey and Overton Mam NP same message I forwarded message to [facility] for review when she returns tomorrow

## 2021-11-28 ENCOUNTER — Other Ambulatory Visit (HOSPITAL_BASED_OUTPATIENT_CLINIC_OR_DEPARTMENT_OTHER): Payer: Self-pay | Admitting: Cardiovascular Disease

## 2021-11-28 ENCOUNTER — Telehealth (HOSPITAL_BASED_OUTPATIENT_CLINIC_OR_DEPARTMENT_OTHER): Payer: Self-pay

## 2021-11-28 DIAGNOSIS — I4821 Permanent atrial fibrillation: Secondary | ICD-10-CM

## 2021-11-28 MED ORDER — AMLODIPINE BESYLATE 2.5 MG PO TABS: 2.5000 mg | ORAL_TABLET | Freq: Every day | ORAL | 2 refills | Status: DC

## 2021-11-28 MED ORDER — ISOSORBIDE MONONITRATE ER 30 MG PO TB24: 15.0000 mg | ORAL_TABLET | Freq: Every day | ORAL | 3 refills | Status: DC

## 2021-11-28 NOTE — Telephone Encounter (Addendum)
Seen by patient Raymond Ray on 11/27/2021  5:51 PM;  medication ordered and sent to pharmacy; follow up message sent via mychart    ----- Message from Loel Dubonnet, NP sent at 11/27/2021  4:30 PM EDT ----- Cardiac PET revealed a small area of ischemia. Normal heart muscle function. As the area of ischemia is small, this is a low risk study which is a reassuring result. Recommend continued medical therapy with Metoprolol, Rosuvastatin.   Recommend addition of Imdur '15mg'$  QHS for optimization of medical therapy and to help reduce ischemia.

## 2021-11-28 NOTE — Telephone Encounter (Signed)
Follow up questions?

## 2021-11-28 NOTE — Telephone Encounter (Signed)
Rx sent at request of [facility] walker, NP

## 2021-11-28 NOTE — Telephone Encounter (Signed)
Please advise 

## 2021-11-29 NOTE — Telephone Encounter (Signed)
Rx(s) sent to pharmacy electronically.  

## 2021-12-30 ENCOUNTER — Ambulatory Visit (INDEPENDENT_AMBULATORY_CARE_PROVIDER_SITE_OTHER): Payer: Medicare Other | Admitting: Cardiovascular Disease

## 2021-12-30 ENCOUNTER — Encounter (HOSPITAL_BASED_OUTPATIENT_CLINIC_OR_DEPARTMENT_OTHER): Payer: Self-pay | Admitting: Cardiovascular Disease

## 2021-12-30 VITALS — BP 118/62 | HR 64 | Ht 75.0 in | Wt 182.0 lb

## 2021-12-30 DIAGNOSIS — I25118 Atherosclerotic heart disease of native coronary artery with other forms of angina pectoris: Secondary | ICD-10-CM | POA: Diagnosis not present

## 2021-12-30 DIAGNOSIS — I7121 Aneurysm of the ascending aorta, without rupture: Secondary | ICD-10-CM

## 2021-12-30 DIAGNOSIS — I2510 Atherosclerotic heart disease of native coronary artery without angina pectoris: Secondary | ICD-10-CM | POA: Diagnosis not present

## 2021-12-30 DIAGNOSIS — Z5181 Encounter for therapeutic drug level monitoring: Secondary | ICD-10-CM | POA: Diagnosis not present

## 2021-12-30 DIAGNOSIS — I340 Nonrheumatic mitral (valve) insufficiency: Secondary | ICD-10-CM

## 2021-12-30 DIAGNOSIS — I4821 Permanent atrial fibrillation: Secondary | ICD-10-CM | POA: Diagnosis not present

## 2021-12-30 NOTE — Assessment & Plan Note (Signed)
Aortic aneurysm stable on imaging 03/2021.  Was 4.1 cm.  Blood pressure well controlled.  Continue beta-blocker.

## 2021-12-30 NOTE — Progress Notes (Signed)
Cardiology Office Note  Morning Date:  12/30/2021   ID:  Raymond Ray, DOB December 23, 1950, MRN [id number]  PCP:  Crist Infante, MD  Cardiologist:  [name], MD  Electrophysiologist:  None   Evaluation Performed:  Follow-Up Visit  Chief Complaint:  Atrial fibrillation  History of Present Illness:    Raymond Ray is a 71 y.o. male with chronic atrial fibrillation (s/p LAA clipping) and mitral regurgitation 2/2 MVP s/p mitral valve repair, mild carotid stenosis, mild CAD, who presents for follow up.  Raymond Ray was previously a patient of Dr. Mare Ferrari.  He first developed atrial fibrillation in 1986.  He was asymptomatic and it was discovered on physical exam.  He was initially treated with amiodarone and this was discontinued due to concern for long-term toxicity.  He was then on quinidine and this was transitioned to digoxin when he moved from [place] to the [place]. Raymond Ray had an echo 10/09/09 that showed normal systolic function and posterior mitral valve prolapse with moderate mitral regurgitation and normal PA pressures.  He had a nuclear stress 04/29/02 showing no ischemia and an ejection fraction of 55%.  He developed epistaxis on Xarelto and switched back to Coumadin with an INR goal of 1.8-2.2.  However,  He switch to Eliquis with no overt bleeding.  He was noted to have a low iron levels and anemia.  He was started on iron supplementation and has continued on Eliquis.  He had a colonoscopy 08/2018 that revealed diverticuli and a 3 mm polyp that was removed.  At his last appointment he reported some mild exertional dyspnea.  He had a repeat echo 07/2019 that revealed LVEF 50 to 55%.  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  He underwent minimally invasive mitral valve repair with Dr. Roxy Manns on 09/2019.  He had a 36 mm Medtronic Sinuform Ring and the left atrial appendage was clipped.  He was switched back to warfarin for valvular atrial  fibrillation.  Raymond Ray had fast heart rate with exercise. He wore a monitor 03/28/20 that showed his heart rate increase to 201 with up to 4 second pauses at night. He had an ETT on 04/27/20 with poorly control heart rate with 1 mm ST depression.  Metoprolol was reduced and digoxin was restarted.  He underwent hip replacement on 05/2020.  He underwent bone marrow biopsy 01/2021 for longstanding microcytic anemia.  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  Given that he had been stable for >10 years they recommended continued observation. He had an Echo 03/2021 that revealed LVEF 50-55%. 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 He followed up with Raymond [place], NP, 08/2021 after wearing a monitor that showed 100% atrial fibrillation with an average heart rate of 90 bpm. The digoxin was increased. His monitor did show up to 10 beats of NSVT versus atrial fibrillation with aberrancy. He had a PET scan 11/2021 that showed a small area of ischemia in the mid infra-lateral region. There was severe coronary calcification in the left circumflex, LAD, and RCA region. He was started on imdur.   Today, he says he has been feeling very good. He states that he has not noticed any difference with his medication change to Imdur. He reports that he has noticed that he has been having more energy and less lethargy for a few months now. Additionally, he states that he has not experienced that much shortness of breath.  However, with his knee pain, he has not been very active. Of note, he has experienced more bilateral LE edema recently than he has in the past several months. This edema has become more common since starting amlodipine. He mentions that he has not been as active as he would have like these past several months due to an injury to his knee. He will be starting back with a trainer soon. He denies any palpitations or chest pain. No lightheadedness, headaches, syncope,  orthopnea, or PND.  Past Medical History:  Diagnosis Date   Allergy    Anemia    history of   Ascending aortic aneurysm ([place]) 05/29/2020   CAD in native artery 05/29/2020   35% LAD on cath.  LDL was 74 on 04/2018.  LDL goal is less than 70.  It was 46 on 12/2019.  Continue rosuvastatin.   Carotid stenosis 05/29/2020   Chronic atrial fibrillation ([place])    Dysrhythmia 1970s   Exertional dyspnea 04/11/2020   Heart murmur    History of colonoscopy 04/2001   negative   Hypertension    Mitral valve prolapse    Nonrheumatic mitral (valve) insufficiency    Osteoarthritis of hip    Permanent atrial fibrillation (HCC)    PONV (postoperative nausea and vomiting)    left knee cartilage removal;     Prostate cancer ([place])    S/P minimally-invasive mitral valve repair 09/28/2019   Complex valvuloplasty including artificial Gore-tex neochord placement x6 with 36 mm Medtronic Sinuform annuloplasty ring via right mini thoracotomy approach   Tricuspid regurgitation    Vertigo 01/07/2017   currently not symptomatic    Past Surgical History:  Procedure Laterality Date   BUBBLE STUDY  08/24/2019   Procedure: BUBBLE STUDY;  Surgeon: [name], MD;  Location: [facility] END[facility];  Service: Cardiology;;   CARDIAC CATHETERIZATION  09/28/2019   CLIPPING OF ATRIAL APPENDAGE N/A 09/28/2019   Procedure: CLIPPING OF ATRIAL APPENDAGE USING ATRICURE CLIP SIZE 45MM;  Surgeon: Rexene Alberts, MD;  Location: [place];  Service: Open Heart Surgery;  Laterality: N/A;   COLONOSCOPY     KNEE SURGERY     left at age 21 in [place] Bilateral 06/02/2019   Procedure: LYMPHADENECTOMY, PELVIC;  Surgeon: [name], MD;  Location: [facility];  Service: Urology;  Laterality: Bilateral;   MINIMALLY INVASIVE TRICUSPID VALVE REPAIR Right 09/28/2019   Procedure: possible MINIMALLY INVASIVE TRICUSPID VALVE REPAIR;  Surgeon: Rexene Alberts, MD;  Location: [place];  Service: Open Heart Surgery;  Laterality: Right;   MITRAL  VALVE REPAIR Right 09/28/2019   Procedure: MINIMALLY INVASIVE MITRAL VALVE REPAIR (MVR) USING SIMUFORM 36MM;  Surgeon: Rexene Alberts, MD;  Location: [place];  Service: Open Heart Surgery;  Laterality: Right;   PROSTATE BIOPSY     RIGHT/LEFT HEART CATH AND CORONARY ANGIOGRAPHY N/A 09/07/2019   Procedure: RIGHT/LEFT HEART CATH AND CORONARY ANGIOGRAPHY;  Surgeon: Sherren Mocha, MD;  Location: [place] [facility];  Service: Cardiovascular;  Laterality: N/A;   ROBOT ASSISTED LAPAROSCOPIC RADICAL PROSTATECTOMY N/A 06/02/2019   Procedure: XI ROBOTIC ASSISTED LAPAROSCOPIC RADICAL PROSTATECTOMY LEVEL 2;  Surgeon: [name], MD;  Location: [facility];  Service: Urology;  Laterality: N/A;   spinal injection     December 2013   TEE WITHOUT CARDIOVERSION N/A 08/24/2019   Procedure: TRANSESOPHAGEAL ECHOCARDIOGRAM (TEE);  Surgeon: [name], MD;  Location: [place];  Service: Cardiology;  Laterality: N/A;   TEE WITHOUT CARDIOVERSION N/A 09/28/2019  Procedure: TRANSESOPHAGEAL ECHOCARDIOGRAM (TEE);  Surgeon: Rexene Alberts, MD;  Location: [place];  Service: Open Heart Surgery;  Laterality: N/A;   TOTAL HIP ARTHROPLASTY Right 01/29/2017   Procedure: RIGHT TOTAL HIP ARTHROPLASTY ANTERIOR APPROACH;  Surgeon: Rod Can, MD;  Location: [facility];  Service: Orthopedics;  Laterality: Right;   TOTAL HIP ARTHROPLASTY Left 06/20/2020   Procedure: TOTAL HIP ARTHROPLASTY ANTERIOR APPROACH;  Surgeon: Rod Can, MD;  Location: [facility];  Service: Orthopedics;  Laterality: Left;   VASECTOMY       Current Meds  Medication Sig   apixaban (ELIQUIS) 5 MG TABS tablet Take 1 tablet (5 mg total) by mouth 2 (two) times daily.   digoxin (LANOXIN) 0.25 MG tablet Take 1 tablet (0.25 mg total) by mouth daily.   fexofenadine (ALLEGRA) 180 MG tablet Take 180 mg by mouth daily as needed for allergies.   hydrocortisone cream 1 % Apply 1 application topically 2 (two) times daily as needed for itching.   isosorbide  mononitrate (IMDUR) 30 MG 24 hr tablet Take 0.5 tablets (15 mg total) by mouth daily.   metoprolol tartrate (LOPRESSOR) 25 MG tablet 1 AND 1/2 TABLETS TWICE A DAY   Multiple Vitamins-Minerals (MULTIVITAMIN WITH MINERALS) tablet Take 1 tablet by mouth daily. [facility] men    polyethylene glycol (MIRALAX / GLYCOLAX) 17 g packet Take 17 g by mouth daily.   pregabalin (LYRICA) 50 MG capsule Take 50 mg by mouth 3 (three) times daily.   rosuvastatin (CRESTOR) 5 MG tablet Take 5 mg by mouth daily.   zolpidem (AMBIEN) 5 MG tablet Take 5 mg by mouth at bedtime.   [DISCONTINUED] amLODipine (NORVASC) 2.5 MG tablet Take 1 tablet (2.5 mg total) by mouth daily.   [DISCONTINUED] sildenafil (VIAGRA) 100 MG tablet Take 100 mg by mouth as needed for erectile dysfunction.     Allergies:   Amlodipine   Social History   Tobacco Use   Smoking status: Never   Smokeless tobacco: Never  Vaping Use   Vaping Use: Never used  Substance Use Topics   Alcohol use: No   Drug use: No     Family Hx: The patient's family history includes Arrhythmia in his father; Breast cancer in his paternal aunt and sister; Cancer in his mother; Diabetes in his mother; Heart attack in his mother. There is no history of Colon cancer, Esophageal cancer, Stomach cancer, or Rectal cancer.  ROS:   Please see the history of present illness.   (+) Bilateral LE edema All other systems reviewed and are negative.   Prior CV studies:   The following studies were reviewed today:  CT Cardiac PET 11/26/2021:   Findings are consistent with ischemia. The study is low risk.   There is a small mid inferolateral perfusion defect in stress, not present with rest, with decrease in regional stress MPFR (1.59).  This is consistent with ischemia.  This is a small territory of ischemia.   Rest left ventricular function is normal. Stress left ventricular function is normal. End diastolic cavity size is normal. End systolic cavity size is normal.  LVEF  Reserve is -2% but suspect this is erroneous in the setting of hyperdynamic function and permanent atrial fibrillation.  Normal TID assessment.   Coronary calcium was present on the attenuation correction CT images. Severe coronary calcifications were present. Coronary calcifications were present in the left anterior descending artery, left circumflex artery and right coronary artery distribution(s).   Annuloplasty ring visualized.   Long Term Monitor  08/2021: 3 Day Zio Monitor   Quality: Fair.  Baseline artifact. Predominant rhythm: Atrial fibrillation Average heart rate: 90 bpm Max heart rate: 207 bpm Min heart rate: 41 bpm Pauses >2.5 seconds: None   100% atrial fibrillation burden <1% PVCs  Up to 10 beats of NSVT vs atrial fibrillation with aberrancy   Echo 04/10/2021: 1. Anbormal septal motion . Left ventricular ejection fraction, by  estimation, is 50 to 55%. The left ventricle has low normal function. The  left ventricle has no regional wall motion abnormalities. Left ventricular  diastolic parameters are  indeterminate.   2. Right ventricular systolic function is mildly reduced. The right  ventricular size is mildly enlarged.   3. Left atrial size was severely dilated.   4. Right atrial size was severely dilated.   5. Post repair with annuloplasty ring no significant residual MR or MS .  The mitral valve has been repaired/replaced. Trivial mitral valve  regurgitation. No evidence of mitral stenosis.   6. Tricuspid valve regurgitation is moderate.   7. The aortic valve is tricuspid. There is mild calcification of the  aortic valve. There is mild thickening of the aortic valve. Aortic valve  regurgitation is trivial. Aortic valve sclerosis is present, with no  evidence of aortic valve stenosis.   8. Aortic dilatation noted. There is mild dilatation of the aortic root,  measuring 41 mm. There is mild dilatation of the ascending aorta,  measuring 39 mm.   9. The inferior vena  cava is normal in size with greater than 50%  respiratory variability, suggesting right atrial pressure of 3 mmHg.   Comparison(s): EF 50%, AOR 13m, MV myxomatous, MV mean gradient 2.771mG,  RVSP 30.12m40m.    Echo 09/2019:  1. Normal LV function; s/p MV repair with mild MS (mean gradient 5 mm Hg;  MVA 2.1 cm2) and no MR; moderate biatrial enlargement.   2. Left ventricular ejection fraction, by estimation, is 50 to 55%. The  left ventricle has low normal function. The left ventricle has no regional  wall motion abnormalities.   3. Right ventricular systolic function is normal. The right ventricular  size is normal.   4. Left atrial size was moderately dilated.   5. Right atrial size was moderately dilated.   6. The mitral valve has been repaired/replaced. Trivial mitral valve  regurgitation. Mild mitral stenosis.   7. The aortic valve is tricuspid. Aortic valve regurgitation is not  visualized. No aortic stenosis is present.   8. The inferior vena cava is dilated in size with >50% respiratory  variability, suggesting right atrial pressure of 8 mmHg.   Echo 07/2019: 1. Left ventricular ejection fraction, by estimation, is 50 to 55%. The  left ventricle has low normal function. The left ventricle has no regional  wall motion abnormalities. Left ventricular diastolic function could not  be evaluated.   2. Right ventricular systolic function is normal. The right ventricular  size is normal. There is moderately elevated pulmonary artery systolic  pressure.   3. Left atrial size was severely dilated.   4. Right atrial size was severely dilated.   5. The mitral valve is myxomatous. Moderate to severe mitral valve  regurgitation.   6. Tricuspid valve regurgitation is mild to moderate.   7. The aortic valve is normal in structure. Aortic valve regurgitation is  not visualized. No aortic stenosis is present.   8. Aortic dilatation noted. There is mild dilatation of the ascending  aorta  measuring 38 mm.  LHC 08/2019: 1.  Calcified nonobstructive proximal LAD stenosis 2.  Patent coronary arteries with mild diffuse irregularity and no flow-limiting coronary stenoses 3.  Normal right heart filling pressures with preserved cardiac output 4.  20 mm V wave consistent with the patient's known mitral regurgitation   Recommendation: Continued plans for cardiac surgical evaluation for mitral valve repair  Carotid Dopplers 09/2019: 1 to 39% ICA stenosis bilaterally.    Labs/Other Tests and Data Reviewed:    EKG: EKG is personally reviewed. 12/30/21: EKG was not ordered. 04/05/21: Atrial fibrillation.  Rate 76 bpm  05/29/20: Atrial Fibrillation. Rate 79 bpm.  04/11/20: Atrial fibrillation.  Rate 88 bpm. 10/14/19: Atrial fibrillation. Rate 80 bpm.  Recent Labs: 09/09/2021: Magnesium 2.2 10/07/2021: ALT 12; BUN 20; Creatinine 1.12; Potassium 4.4; Sodium 139 10/29/2021: Hemoglobin 11.3; Platelet Count 265   Recent Lipid Panel Lab Results  Component Value Date/Time   CHOL 166 03/03/2011 09:22 AM   TRIG 41.0 03/03/2011 09:22 AM   HDL 55.80 03/03/2011 09:22 AM   CHOLHDL 3 03/03/2011 09:22 AM   LDLCALC 102 (H) 03/03/2011 09:22 AM    04/30/2018: Total cholesterol 132, triglycerides 64, HDL 54, LDL 73 Sodium 140, BUN 28, creatinine 1.3 AST 18, ALT 15 TSH 1.37  07/08/2018: Hemoglobin 12.2  04/03/20 14 Day Event Monitor   Quality: Fair.  Baseline artifact. Predominant rhythm: atrial fibrillation Average heart rate: 86 bpm Max heart rate: 201 bpm Min heart rate: 36 bpm Pauses >2.5 seconds: Pauses up to 4.3 seconds.  Pauses noted overnight/early morning.   Up to 13 beats NSVT.  Max rate 250 bpm.  Exercise Tolerance Test 04/27/20 Blood pressure demonstrated a normal response to exercise. Horizontal ST segment depression ST segment depression of 1 mm was noted during stress in the III, II, aVF, V6 and V5 leads, beginning at 4 minutes of stress, and returning to baseline after  1-5 minutes of recovery. Abnormal exercise stress test. Exaggerated heart rate response due to atrial fibrillation. There is borderline ST segment depression during exercise that resolves fairly rapidly in recovery. Although "digoxin effect" is not seen on the baseline tracing, cannot exclude "false positive response" due to digoxin.   Wt Readings from Last 3 Encounters:  12/30/21 182 lb (82.6 kg)  09/09/21 173 lb 3.2 oz (78.6 kg)  08/14/21 175 lb 3.2 oz (79.5 kg)     Objective:    VS:  BP 118/62   Pulse 64   Ht _0  (1.905 m)   Wt 182 lb (82.6 kg)   BMI 22.75 kg/m  , BMI Body mass index is 22.75 kg/m. GENERAL:  Well appearing HEENT: Pupils equal round and reactive, fundi not visualized, oral mucosa unremarkable NECK:  No jugular venous distention, waveform within normal limits, carotid upstroke brisk and symmetric, no bruits LUNGS:  Clear to auscultation bilaterally HEART: Irregularly irregular PMI not displaced or sustained,S1 and S2 within normal limits, no S3, no S4, no clicks, no rubs, no murmurs ABD:  Flat, positive bowel sounds normal in frequency in pitch, no bruits, no rebound, no guarding, no midline pulsatile mass, no hepatomegaly, no splenomegaly EXT:  2 plus pulses throughout,1+ bilateral LE edema, no cyanosis no clubbing SKIN:  No rashes no nodules NEURO:  Cranial nerves II through XII grossly intact, motor grossly intact throughout PSYCH:  Cognitively intact, oriented to person place and time  ASSESSMENT & PLAN:    CAD in native artery Raymond Ray has coronary calcification seen in all 3 coronary territories on his PET CT.  He had a small area of ischemia.  He has not been very physically active lately so is hard to determine how significant it is.  He has had some lower extremity edema on amlodipine.  Imdur was previously recommended but he declined given that it was contraindicated in the setting of sildenafil use.  He now thinks that he would like to stop the  sildenafil and amlodipine and switch to Imdur.  We discussed options of proceeding cardiac catheterization and versus continue with medical management and seeing how he does as he starts to increase his exercise.  He would like to stick with medical management for now.  He is not on aspirin given that he is on Eliquis.  Continue metoprolol and rosuvastatin.  Lipids have historically been well controlled.  He sees his PCP next month and will have them checked.  LDL goal is less than 70.  Ascending aortic aneurysm (HCC) Aortic aneurysm stable on imaging 03/2021.  Was 4.1 cm.  Blood pressure well controlled.  Continue beta-blocker.  Permanent atrial fibrillation ([place]) He remains in atrial fibrillation.  Rates are well controlled on digoxin and metoprolol.  Continue Eliquis.  Nonrheumatic mitral (valve) insufficiency Mitral valve was repaired and looked stable on echo 03/2021.  He has just mild edema that seems to be related to amlodipine and not heart failure.  Stopping amlodipine as above.   Medication Adjustments/Labs and Tests Ordered: Current medicines are reviewed at length with the patient today.  Concerns regarding medicines are outlined above.   Tests Ordered: Orders Placed This Encounter  Procedures   Lipid panel   Comprehensive metabolic panel    Medication Changes: No orders of the defined types were placed in this encounter.   Disposition: FU with Hellena Pridgen C. Oval Linsey, MD, [facility] in 3 months.   I,Breanna Adamick,acting as a scribe for [name], MD.,have documented all relevant documentation on the behalf of [name], MD,as directed by  [name], MD while in the presence of [name], MD.  I, Tolley Oval Linsey, MD have reviewed all documentation for this visit.  The documentation of the exam, diagnosis, procedures, and orders on 12/30/2021 are all accurate and complete.   Signed, [name], MD  12/30/2021 1:12 PM    [facility]

## 2021-12-30 NOTE — Patient Instructions (Addendum)
Medication Instructions:  STOP AMLODIPINE   START ISOSORBIDE 30 MG 1/2 TABLET DAILY   STOP SILDENAFIL   *If you need a refill on your cardiac medications before your next appointment, please call your pharmacy*  Lab Work: FASTING LP/CMET WHEN YOU GO TO DR PERENI'S, FAX TO DR [facility] AT [phone]   If you have labs (blood work) drawn today and your tests are completely normal, you will receive your results only by: [place] (if you have MyChart) OR A paper copy in the mail If you have any lab test that is abnormal or we need to change your treatment, we will call you to review the results.  Testing/Procedures: NONE  Follow-Up: At [facility], you and your health needs are our priority.  As part of our continuing mission to provide you with exceptional heart care, we have created designated Provider Care Teams.  These Care Teams include your primary Cardiologist (physician) and Advanced Practice Providers (APPs -  Physician Assistants and Nurse Practitioners) who all work together to provide you with the care you need, when you need it.  We recommend signing up for the patient portal called "MyChart".  Sign up information is provided on this After Visit Summary.  MyChart is used to connect with patients for Virtual Visits (Telemedicine).  Patients are able to view lab/test results, encounter notes, upcoming appointments, etc.  Non-urgent messages can be sent to your provider as well.   To learn more about what you can do with MyChart, go to [email or web address].    Your next appointment:   3 month(s)  The format for your next appointment:   In Person  Provider:   [name], MD

## 2021-12-30 NOTE — Assessment & Plan Note (Signed)
He remains in atrial fibrillation.  Rates are well controlled on digoxin and metoprolol.  Continue Eliquis.

## 2021-12-30 NOTE — Assessment & Plan Note (Signed)
Raymond Ray has coronary calcification seen in all 3 coronary territories on his PET CT.  He had a small area of ischemia.  He has not been very physically active lately so is hard to determine how significant it is.  He has had some lower extremity edema on amlodipine.  Imdur was previously recommended but he declined given that it was contraindicated in the setting of sildenafil use.  He now thinks that he would like to stop the sildenafil and amlodipine and switch to Imdur.  We discussed options of proceeding cardiac catheterization and versus continue with medical management and seeing how he does as he starts to increase his exercise.  He would like to stick with medical management for now.  He is not on aspirin given that he is on Eliquis.  Continue metoprolol and rosuvastatin.  Lipids have historically been well controlled.  He sees his PCP next month and will have them checked.  LDL goal is less than 70.

## 2021-12-30 NOTE — Assessment & Plan Note (Signed)
Mitral valve was repaired and looked stable on echo 03/2021.  He has just mild edema that seems to be related to amlodipine and not heart failure.  Stopping amlodipine as above.

## 2022-01-02 DIAGNOSIS — D649 Anemia, unspecified: Secondary | ICD-10-CM | POA: Diagnosis not present

## 2022-01-02 DIAGNOSIS — Z1212 Encounter for screening for malignant neoplasm of rectum: Secondary | ICD-10-CM | POA: Diagnosis not present

## 2022-01-03 DIAGNOSIS — D649 Anemia, unspecified: Secondary | ICD-10-CM | POA: Diagnosis not present

## 2022-01-03 DIAGNOSIS — E785 Hyperlipidemia, unspecified: Secondary | ICD-10-CM | POA: Diagnosis not present

## 2022-01-03 DIAGNOSIS — R7989 Other specified abnormal findings of blood chemistry: Secondary | ICD-10-CM | POA: Diagnosis not present

## 2022-01-03 DIAGNOSIS — Z125 Encounter for screening for malignant neoplasm of prostate: Secondary | ICD-10-CM | POA: Diagnosis not present

## 2022-01-06 DIAGNOSIS — M1712 Unilateral primary osteoarthritis, left knee: Secondary | ICD-10-CM | POA: Diagnosis not present

## 2022-01-06 DIAGNOSIS — D649 Anemia, unspecified: Secondary | ICD-10-CM | POA: Diagnosis not present

## 2022-01-06 DIAGNOSIS — Z1212 Encounter for screening for malignant neoplasm of rectum: Secondary | ICD-10-CM | POA: Diagnosis not present

## 2022-01-07 DIAGNOSIS — N1831 Chronic kidney disease, stage 3a: Secondary | ICD-10-CM | POA: Diagnosis not present

## 2022-01-07 DIAGNOSIS — D469 Myelodysplastic syndrome, unspecified: Secondary | ICD-10-CM | POA: Diagnosis not present

## 2022-01-07 DIAGNOSIS — L111 Transient acantholytic dermatosis [Grover]: Secondary | ICD-10-CM | POA: Diagnosis not present

## 2022-01-07 DIAGNOSIS — R82998 Other abnormal findings in urine: Secondary | ICD-10-CM | POA: Diagnosis not present

## 2022-01-07 DIAGNOSIS — I2510 Atherosclerotic heart disease of native coronary artery without angina pectoris: Secondary | ICD-10-CM | POA: Diagnosis not present

## 2022-01-07 DIAGNOSIS — R5383 Other fatigue: Secondary | ICD-10-CM | POA: Diagnosis not present

## 2022-01-07 DIAGNOSIS — G4700 Insomnia, unspecified: Secondary | ICD-10-CM | POA: Diagnosis not present

## 2022-01-07 DIAGNOSIS — I4811 Longstanding persistent atrial fibrillation: Secondary | ICD-10-CM | POA: Diagnosis not present

## 2022-01-07 DIAGNOSIS — Z1339 Encounter for screening examination for other mental health and behavioral disorders: Secondary | ICD-10-CM | POA: Diagnosis not present

## 2022-01-07 DIAGNOSIS — Z0000 Encounter for general adult medical examination without abnormal findings: Secondary | ICD-10-CM | POA: Diagnosis not present

## 2022-01-07 DIAGNOSIS — Z1331 Encounter for screening for depression: Secondary | ICD-10-CM | POA: Diagnosis not present

## 2022-01-07 DIAGNOSIS — I341 Nonrheumatic mitral (valve) prolapse: Secondary | ICD-10-CM | POA: Diagnosis not present

## 2022-01-07 DIAGNOSIS — D6869 Other thrombophilia: Secondary | ICD-10-CM | POA: Diagnosis not present

## 2022-01-07 DIAGNOSIS — C61 Malignant neoplasm of prostate: Secondary | ICD-10-CM | POA: Diagnosis not present

## 2022-01-14 ENCOUNTER — Telehealth: Payer: Self-pay | Admitting: Cardiovascular Disease

## 2022-01-14 ENCOUNTER — Encounter (HOSPITAL_BASED_OUTPATIENT_CLINIC_OR_DEPARTMENT_OTHER): Payer: Self-pay

## 2022-01-14 DIAGNOSIS — Z5181 Encounter for therapeutic drug level monitoring: Secondary | ICD-10-CM

## 2022-01-14 NOTE — Addendum Note (Signed)
Addended by: Alvina Filbert B on: 01/14/2022 05:32 PM   Modules accepted: Orders

## 2022-01-14 NOTE — Telephone Encounter (Signed)
Discussed with Dr Oval Linsey and she wanted to know when patient had last dose of Digoxin prior to labs, patient has been stable on current dose  Spoke with patient and he did have his Digoxin morning of lab testing Patient returning in February and will have labs prior to visit  He is aware to hold Digoxin morning of labs

## 2022-01-14 NOTE — Telephone Encounter (Signed)
Pt c/o medication issue:  1. Name of Medication:   digoxin (LANOXIN) 0.25 MG tablet    2. How are you currently taking this medication (dosage and times per day)? Take 1 tablet (0.25 mg total) by mouth daily   3. Are you having a reaction (difficulty breathing--STAT)? No  4. What is your medication issue? Pt calling to make provider aware that his Digoxin level is 1.7 per his PCP

## 2022-01-14 NOTE — Telephone Encounter (Signed)
Labs requested from PCP, encounter printed and routed to MD for review.

## 2022-01-15 DIAGNOSIS — C61 Malignant neoplasm of prostate: Secondary | ICD-10-CM | POA: Diagnosis not present

## 2022-01-22 DIAGNOSIS — N5201 Erectile dysfunction due to arterial insufficiency: Secondary | ICD-10-CM | POA: Diagnosis not present

## 2022-01-22 DIAGNOSIS — C61 Malignant neoplasm of prostate: Secondary | ICD-10-CM | POA: Diagnosis not present

## 2022-01-30 DIAGNOSIS — N5201 Erectile dysfunction due to arterial insufficiency: Secondary | ICD-10-CM | POA: Diagnosis not present

## 2022-02-03 ENCOUNTER — Other Ambulatory Visit: Payer: Self-pay | Admitting: Hematology and Oncology

## 2022-02-03 DIAGNOSIS — C61 Malignant neoplasm of prostate: Secondary | ICD-10-CM

## 2022-02-03 DIAGNOSIS — D539 Nutritional anemia, unspecified: Secondary | ICD-10-CM

## 2022-02-03 NOTE — Progress Notes (Signed)
[place] [phone]   [phone]  PROGRESS NOTE  Patient Care Team: Crist Infante, MD as PCP - General (Internal Medicine) [name], MD as PCP - Cardiology (Cardiology) Alda Berthold, DO as Consulting Physician (Neurology) Cira Rue, RN Nurse Navigator as Registered Nurse (Medical Oncology)  Hematological/Oncological History # Macrocytic Anemia # Low Grade Myelodysplastic Syndrome  03/03/2011: WBC 4.1, Hgb 12.7, MCV 104.6, Plt 203 01/30/2017: WBC 12.1, Hgb 9.2, MCV 100.7, Plt 232 09/28/2019: WBC 24, Hgb 9.3, MCV 107.9, Plt 189 06/21/2020: WBC 10.8, Hgb 8.5, MCV 107.1, Plt 264 01/07/2021: establish care with Dr. Lorenso Courier     02/12/2021: Bmbx showed a hypercellular marrow with dyspoiesis and ring sideroblasts  Interval History:  Raymond Ray 71 y.o. male with medical history significant for low-grade myelodysplastic syndrome who presents for a follow up visit. The patient's last visit was on 08/14/2021. In the interim since the last visit he has had no major changes in his health.  On exam today Raymond Ray notes he has been well in the interim since her last visit.  He reports his energy is improving and he is able to move around without difficulty.  He reports that he is exercising approximately 2 times per week.  He reports that he has a good appetite and his weight has been stable.  He notes that overall he has had no major changes in his health in the interim since our last visit.  He discontinued taking the oxycodone because he was "tired of being on it".  He felt like it was not working.  Otherwise he has had no major changes in his health.  Otherwise he feels well and has no questions concerns or complaints.  Full 10 point ROS is listed below.  MEDICAL HISTORY:  Past Medical History:  Diagnosis Date   Allergy    Anemia    history of   Ascending aortic aneurysm ([place]) 05/29/2020   CAD in native artery 05/29/2020   35% LAD on cath.   LDL was 74 on 04/2018.  LDL goal is less than 70.  It was 46 on 12/2019.  Continue rosuvastatin.   Carotid stenosis 05/29/2020   Chronic atrial fibrillation ([place])    Dysrhythmia 1970s   Exertional dyspnea 04/11/2020   Heart murmur    History of colonoscopy 04/2001   negative   Hypertension    Mitral valve prolapse    Nonrheumatic mitral (valve) insufficiency    Osteoarthritis of hip    Permanent atrial fibrillation (H[facility])    PONV (postoperative nausea and vomiting)    left knee cartilage removal;     Prostate cancer ([place])    S/P minimally-invasive mitral valve repair 09/28/2019   Complex valvuloplasty including artificial Gore-tex neochord placement x6 with 36 mm Medtronic Sinuform annuloplasty ring via right mini thoracotomy approach   Tricuspid regurgitation    Vertigo 01/07/2017   currently not symptomatic     SURGICAL HISTORY: Past Surgical History:  Procedure Laterality Date   BUBBLE STUDY  08/24/2019   Procedure: BUBBLE STUDY;  Surgeon: [name], MD;  Location: [facility];  Service: Cardiology;;   CARDIAC CATHETERIZATION  09/28/2019   CLIPPING OF ATRIAL APPENDAGE N/A 09/28/2019   Procedure: CLIPPING OF ATRIAL APPENDAGE USING ATRICURE CLIP SIZE 45MM;  Surgeon: Rexene Alberts, MD;  Location: [facility];  Service: Open Heart Surgery;  Laterality: N/A;   COLONOSCOPY     KNEE SURGERY     left at age 46 in 67.  [place]   LYMPHADENECTOMY Bilateral 06/02/2019   Procedure: LYMPHADENECTOMY, PELVIC;  Surgeon: [name], MD;  Location: [facility];  Service: Urology;  Laterality: Bilateral;   MINIMALLY INVASIVE TRICUSPID VALVE REPAIR Right 09/28/2019   Procedure: possible MINIMALLY INVASIVE TRICUSPID VALVE REPAIR;  Surgeon: Rexene Alberts, MD;  Location: [facility];  Service: Open Heart Surgery;  Laterality: Right;   MITRAL VALVE REPAIR Right 09/28/2019   Procedure: MINIMALLY INVASIVE MITRAL VALVE REPAIR (MVR) USING SIMUFORM 36MM;  Surgeon: Rexene Alberts, MD;  Location: [facility];  Service: Open  Heart Surgery;  Laterality: Right;   PROSTATE BIOPSY     RIGHT/LEFT HEART CATH AND CORONARY ANGIOGRAPHY N/A 09/07/2019   Procedure: RIGHT/LEFT HEART CATH AND CORONARY ANGIOGRAPHY;  Surgeon: Sherren Mocha, MD;  Location: [facility];  Service: Cardiovascular;  Laterality: N/A;   ROBOT ASSISTED LAPAROSCOPIC RADICAL PROSTATECTOMY N/A 06/02/2019   Procedure: XI ROBOTIC ASSISTED LAPAROSCOPIC RADICAL PROSTATECTOMY LEVEL 2;  Surgeon: [name], MD;  Location: [facility];  Service: Urology;  Laterality: N/A;   spinal injection     December 2013   TEE WITHOUT CARDIOVERSION N/A 08/24/2019   Procedure: TRANSESOPHAGEAL ECHOCARDIOGRAM (TEE);  Surgeon: [name], MD;  Location: [facility];  Service: Cardiology;  Laterality: N/A;   TEE WITHOUT CARDIOVERSION N/A 09/28/2019   Procedure: TRANSESOPHAGEAL ECHOCARDIOGRAM (TEE);  Surgeon: Rexene Alberts, MD;  Location: [facility];  Service: Open Heart Surgery;  Laterality: N/A;   TOTAL HIP ARTHROPLASTY Right 01/29/2017   Procedure: RIGHT TOTAL HIP ARTHROPLASTY ANTERIOR APPROACH;  Surgeon: Rod Can, MD;  Location: [facility];  Service: Orthopedics;  Laterality: Right;   TOTAL HIP ARTHROPLASTY Left 06/20/2020   Procedure: TOTAL HIP ARTHROPLASTY ANTERIOR APPROACH;  Surgeon: Rod Can, MD;  Location: [facility];  Service: Orthopedics;  Laterality: Left;   VASECTOMY      SOCIAL HISTORY: Social History   Socioeconomic History   Marital status: Married    Spouse name: [name]   Number of children: 2   Years of education: Not on file   Highest education level: Not on file  Occupational History   Occupation: Retired  Tobacco Use   Smoking status: Never   Smokeless tobacco: Never  Vaping Use   Vaping Use: Never used  Substance and Sexual Activity   Alcohol use: No   Drug use: No   Sexual activity: Yes  Other Topics Concern   Not on file  Social History Narrative   Right handed   Lives in two story home with wife   Daughter, [name], resides  in [place]. 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 Daughter, [name], resides in [place]. 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    Social Determinants of Health   Financial Resource Strain: Not on file  Food Insecurity: Not on file  Transportation Needs: Not on file  Physical Activity: Not on file  Stress: Not on file  Social Connections: Not on file  Intimate Partner Violence: Not on file    FAMILY HISTORY: Family History  Problem Relation Age of Onset   Arrhythmia Father        tachycardia   Cancer Mother    Heart attack Mother    Diabetes Mother    Breast cancer Sister    Breast cancer Paternal Aunt    Colon cancer Neg Hx    Esophageal cancer Neg Hx    Stomach cancer Neg Hx    Rectal cancer Neg Hx     ALLERGIES:  is allergic to amlodipine.  MEDICATIONS:  Current Outpatient Medications  Medication Sig Dispense Refill   apixaban (ELIQUIS) 5 MG TABS tablet Take 1 tablet (5 mg total) by mouth 2 (two) times daily. 180 tablet 3   digoxin (LANOXIN) 0.25 MG tablet Take 1 tablet (0.25 mg total) by mouth daily. 90 tablet 3   fexofenadine (ALLEGRA) 180 MG tablet Take 180 mg by mouth daily as needed for allergies.     hydrocortisone cream 1 % Apply 1 application topically 2 (two) times daily as needed for itching.     isosorbide mononitrate (IMDUR) 30 MG 24 hr tablet Take 0.5 tablets (15 mg total) by mouth daily. 45 tablet 3   metoprolol tartrate (LOPRESSOR) 25 MG tablet 1 AND 1/2 TABLETS TWICE A DAY 270 tablet 2   Multiple Vitamins-Minerals (MULTIVITAMIN WITH MINERALS) tablet Take 1 tablet by mouth daily. [facility] men      polyethylene glycol (MIRALAX / GLYCOLAX) 17 g packet Take 17 g by mouth daily.     pregabalin (LYRICA) 50 MG capsule Take 50 mg by mouth 3 (three) times daily.     rosuvastatin (CRESTOR) 5 MG tablet Take 5 mg by mouth daily.     zolpidem (AMBIEN) 5 MG tablet Take 5 mg by mouth at bedtime.     No current facility-administered medications for this visit.    REVIEW OF SYSTEMS:    Constitutional: ( - ) fevers, ( - )  chills , ( - ) night sweats Eyes: ( - ) blurriness of vision, ( - ) double vision, ( - ) watery eyes Ears, nose, mouth, throat, and face: ( - ) mucositis, ( - ) sore throat Respiratory: ( - ) cough, ( - ) dyspnea, ( - ) wheezes Cardiovascular: ( - ) palpitation, ( - ) chest discomfort, ( - ) lower extremity swelling Gastrointestinal:  ( - ) nausea, ( - ) heartburn, ( - ) change in bowel habits Skin: ( - ) abnormal skin rashes Lymphatics: ( - ) new lymphadenopathy, ( - ) easy bruising Neurological: ( - ) numbness, ( - ) tingling, ( - ) new weaknesses Behavioral/Psych: ( - ) mood change, ( - ) new changes  All other systems were reviewed with the patient and are negative.  PHYSICAL EXAMINATION: Vitals:   02/05/22 1109  BP: (!) 101/59  Pulse: (!) 57  Resp: 16  Temp: 97.7 F (36.5 C)  SpO2: 98%    Filed Weights   02/05/22 1109  Weight: 180 lb 4.8 oz (81.8 kg)     GENERAL: Well-appearing elderly Caucasian male, alert, no distress and comfortable SKIN: skin color, texture, turgor are normal, no rashes or significant lesions EYES: conjunctiva are pink and non-injected, sclera clear LUNGS: clear to auscultation and percussion with normal breathing effort HEART: regular rate & rhythm and no murmurs and no lower extremity edema Musculoskeletal: no cyanosis of digits and no clubbing  PSYCH: alert & oriented x 3, fluent speech NEURO: no focal motor/sensory deficits  LABORATORY DATA:  I have reviewed the data as listed    Latest Ref Rng & Units 02/05/2022   10:42 AM 10/29/2021   10:34 AM 10/07/2021   10:49 AM  CBC  WBC 4.0 - 10.5 K/uL 7.0  5.6  5.5   Hemoglobin 13.0 - 17.0 g/dL 12.1  11.3  10.7   Hematocrit 39.0 - 52.0 % 35.7  32.5  30.7   Platelets 150 - 400 K/uL 297  265  395        Latest Ref  Rng & Units 02/05/2022   10:42 AM 10/07/2021   10:49 AM 09/18/2021   10:44 AM  CMP  Glucose 70 - 99 mg/dL 106  137  113   BUN 8 - 23 mg/dL _0 Creatinine 0.61 - 1.24 mg/dL 1.07  1.12  1.24   Sodium 135 - 145 mmol/L 142  139  142   Potassium 3.5 - 5.1 mmol/L 4.7  4.4  4.4   Chloride 98 - 111 mmol/L 108  108  107   CO2 22 - 32 mmol/L 31  27  32   Calcium 8.9 - 10.3 mg/dL 9.9  9.2  9.3   Total Protein 6.5 - 8.1 g/dL 6.5  6.6  6.7   Total Bilirubin 0.3 - 1.2 mg/dL 1.7  1.4  1.3   Alkaline Phos 38 - 126 U/L 53  49  50   AST 15 - 41 U/L _1 ALT 0 - 44 U/L _2 Lab Results  Component Value Date   MPROTEIN Not Observed 01/07/2021   Lab Results  Component Value Date   KPAFRELGTCHN 27.7 (H) 01/07/2021   LAMBDASER 17.4 01/07/2021   KAPLAMBRATIO 1.59 01/07/2021   RADIOGRAPHIC STUDIES: No results found.  ASSESSMENT & PLAN Raymond Ray 71 y.o. male with medical history significant for low-grade myelodysplastic syndrome who presents for a follow up visit.   # Macrocytic Anemia # Low Grade Myelodysplastic Syndrome -- No evidence of nutritional deficiency or other possible causes of the patient's macrocytic anemia --Bone marrow biopsy was performed on 02/12/2021 and showed evidence of what appears to be a low-grade myelodysplastic syndrome with ring sideroblasts --Patient has had a low-grade anemia and macrocytosis for at least 5 years with evidence of this dating back 10 years.  Appears to be a stable process --Discussed with him that the treatment of choice would be erythropoietin shots if his hemoglobin were to consistently be less than 10. --labs today show white blood cell count 7.0, hemoglobin 12.1, MCV 104.1, and platelets of 297.  Creatinine was 1.07 --Recommend return to clinic in 6 months time to reevaluate with interval 51-monthlabs  No orders of the defined types were placed in this encounter.   All questions were answered. The patient knows to call the clinic with any problems, questions or concerns.  A total of more than 30 minutes were spent on this encounter with face-to-face  time and non-face-to-face time, including preparing to see the patient, ordering tests and/or medications, counseling the patient and coordination of care as outlined above.   JLedell Peoples MD Department of Hematology/Oncology [facility]Phone: [phone]: [phone]: [email or web address]  02/09/2022 10:17 PM

## 2022-02-05 ENCOUNTER — Inpatient Hospital Stay: Payer: Medicare Other | Attending: Hematology and Oncology

## 2022-02-05 ENCOUNTER — Inpatient Hospital Stay (HOSPITAL_BASED_OUTPATIENT_CLINIC_OR_DEPARTMENT_OTHER): Payer: Medicare Other | Admitting: Hematology and Oncology

## 2022-02-05 VITALS — BP 101/59 | HR 57 | Temp 97.7°F | Resp 16 | Wt 180.3 lb

## 2022-02-05 DIAGNOSIS — C61 Malignant neoplasm of prostate: Secondary | ICD-10-CM

## 2022-02-05 DIAGNOSIS — Z803 Family history of malignant neoplasm of breast: Secondary | ICD-10-CM | POA: Insufficient documentation

## 2022-02-05 DIAGNOSIS — Z8546 Personal history of malignant neoplasm of prostate: Secondary | ICD-10-CM | POA: Diagnosis not present

## 2022-02-05 DIAGNOSIS — I10 Essential (primary) hypertension: Secondary | ICD-10-CM | POA: Insufficient documentation

## 2022-02-05 DIAGNOSIS — D539 Nutritional anemia, unspecified: Secondary | ICD-10-CM

## 2022-02-05 DIAGNOSIS — D4620 Refractory anemia with excess of blasts, unspecified: Secondary | ICD-10-CM | POA: Diagnosis not present

## 2022-02-05 LAB — CBC WITH DIFFERENTIAL (CANCER CENTER ONLY)
Abs Immature Granulocytes: 0.02 10*3/uL (ref 0.00–0.07)
Basophils Absolute: 0.1 10*3/uL (ref 0.0–0.1)
Basophils Relative: 1 %
Eosinophils Absolute: 0.3 10*3/uL (ref 0.0–0.5)
Eosinophils Relative: 4 %
HCT: 35.7 % — ABNORMAL LOW (ref 39.0–52.0)
Hemoglobin: 12.1 g/dL — ABNORMAL LOW (ref 13.0–17.0)
Immature Granulocytes: 0 %
Lymphocytes Relative: 23 %
Lymphs Abs: 1.6 10*3/uL (ref 0.7–4.0)
MCH: 35.3 pg — ABNORMAL HIGH (ref 26.0–34.0)
MCHC: 33.9 g/dL (ref 30.0–36.0)
MCV: 104.1 fL — ABNORMAL HIGH (ref 80.0–100.0)
Monocytes Absolute: 0.6 10*3/uL (ref 0.1–1.0)
Monocytes Relative: 9 %
Neutro Abs: 4.4 10*3/uL (ref 1.7–7.7)
Neutrophils Relative %: 63 %
Platelet Count: 297 10*3/uL (ref 150–400)
RBC: 3.43 MIL/uL — ABNORMAL LOW (ref 4.22–5.81)
RDW: 18.9 % — ABNORMAL HIGH (ref 11.5–15.5)
WBC Count: 7 10*3/uL (ref 4.0–10.5)
nRBC: 0.3 % — ABNORMAL HIGH (ref 0.0–0.2)

## 2022-02-05 LAB — CMP (CANCER CENTER ONLY)
ALT: 12 U/L (ref 0–44)
AST: 16 U/L (ref 15–41)
Albumin: 4.3 g/dL (ref 3.5–5.0)
Alkaline Phosphatase: 53 U/L (ref 38–126)
Anion gap: 3 — ABNORMAL LOW (ref 5–15)
BUN: 20 mg/dL (ref 8–23)
CO2: 31 mmol/L (ref 22–32)
Calcium: 9.9 mg/dL (ref 8.9–10.3)
Chloride: 108 mmol/L (ref 98–111)
Creatinine: 1.07 mg/dL (ref 0.61–1.24)
GFR, Estimated: >60 mL/min (ref >60)
Glucose, Bld: 106 mg/dL — ABNORMAL HIGH (ref 70–99)
Potassium: 4.7 mmol/L (ref 3.5–5.1)
Sodium: 142 mmol/L (ref 135–145)
Total Bilirubin: 1.7 mg/dL — ABNORMAL HIGH (ref 0.3–1.2)
Total Protein: 6.5 g/dL (ref 6.5–8.1)

## 2022-02-13 ENCOUNTER — Other Ambulatory Visit: Payer: Medicare Other

## 2022-02-13 ENCOUNTER — Ambulatory Visit: Payer: Medicare Other | Admitting: Hematology and Oncology

## 2022-03-13 ENCOUNTER — Other Ambulatory Visit (HOSPITAL_BASED_OUTPATIENT_CLINIC_OR_DEPARTMENT_OTHER): Payer: Self-pay | Admitting: Cardiovascular Disease

## 2022-03-13 DIAGNOSIS — I4821 Permanent atrial fibrillation: Secondary | ICD-10-CM

## 2022-03-13 NOTE — Telephone Encounter (Signed)
Eliquis '5mg'$  refill request received. Patient is 72 years old, weight-81.8kg, Crea-1.07 on 02/05/2022 , Diagnosis-Afib, and last seen by Dr. Oval Linsey on 12/30/2021. Dose is appropriate based on dosing criteria. Will send in refill to requested pharmacy.

## 2022-03-13 NOTE — Telephone Encounter (Signed)
Please review for refill. Thank you! 

## 2022-03-28 ENCOUNTER — Telehealth (HOSPITAL_BASED_OUTPATIENT_CLINIC_OR_DEPARTMENT_OTHER): Payer: Self-pay | Admitting: Cardiovascular Disease

## 2022-03-28 NOTE — Telephone Encounter (Signed)
Left message for patient to call and discuss scheduling the follow up Echocardiogram appointment ordered by Dr. Oval Linsey

## 2022-04-02 ENCOUNTER — Other Ambulatory Visit (HOSPITAL_BASED_OUTPATIENT_CLINIC_OR_DEPARTMENT_OTHER): Payer: Self-pay | Admitting: *Deleted

## 2022-04-02 DIAGNOSIS — I4821 Permanent atrial fibrillation: Secondary | ICD-10-CM

## 2022-04-02 DIAGNOSIS — I361 Nonrheumatic tricuspid (valve) insufficiency: Secondary | ICD-10-CM

## 2022-04-04 NOTE — Progress Notes (Signed)
Cardiology Office Note   Date:  04/04/2022   ID:  Raymond Ray, DOB 1950/10/17, MRN [id number]  PCP:  Crist Infante, MD  Cardiologist:  [name], MD  Electrophysiologist:  None   Evaluation Performed:  Follow-Up Visit  Chief Complaint:  Atrial fibrillation  History of Present Illness:    Raymond Ray is a 72 y.o. male with chronic atrial fibrillation (s/p LAA clipping) and mitral regurgitation 2/2 MVP s/p mitral valve repair, mild carotid stenosis, mild CAD, who presents for follow up.  Raymond Ray was previously a patient of Dr. Mare Ferrari.  He first developed atrial fibrillation in 1986.  He was asymptomatic and it was discovered on physical exam.  He was initially treated with amiodarone and this was discontinued due to concern for long-term toxicity.  He was then on quinidine and this was transitioned to digoxin when he moved from [place] to the [place]. Raymond Ray had an echo 10/09/09 that showed normal systolic function and posterior mitral valve prolapse with moderate mitral regurgitation and normal PA pressures.  He had a nuclear stress 04/29/02 showing no ischemia and an ejection fraction of 55%.  He developed epistaxis on Xarelto and switched back to Coumadin with an INR goal of 1.8-2.2.  However,  He switch to Eliquis with no overt bleeding.  He was noted to have a low iron levels and anemia.  He was started on iron supplementation and has continued on Eliquis.  He had a colonoscopy 08/2018 that revealed diverticuli and a 3 mm polyp that was removed.  At his last appointment he reported some mild exertional dyspnea.  He had a repeat echo 07/2019 that revealed LVEF 50 to 55%.  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  He underwent minimally invasive mitral valve repair with Dr. Roxy Manns on 09/2019.  He had a 36 mm Medtronic Sinuform Ring and the left atrial appendage was clipped.  He was switched back to warfarin for valvular atrial  fibrillation.  Raymond Ray had fast heart rate with exercise. He wore a monitor 03/28/20 that showed his heart rate increase to 201 with up to 4 second pauses at night. He had an ETT on 04/27/20 with poorly control heart rate with 1 mm ST depression.  Metoprolol was reduced and digoxin was restarted.  He underwent hip replacement on 05/2020.  He underwent bone marrow biopsy 01/2021 for longstanding microcytic anemia.  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  Given that he had been stable for >10 years they recommended continued observation. He had an Echo 03/2021 that revealed LVEF 50-55%. 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 He followed up with Laurann Montana, NP, 08/2021 after wearing a monitor that showed 100% atrial fibrillation with an average heart rate of 90 bpm. The digoxin was increased. His monitor did show up to 10 beats of NSVT versus atrial fibrillation with aberrancy. He had a PET scan 11/2021 that showed a small area of ischemia in the mid infra-lateral region. There was severe coronary calcification in the left circumflex, LAD, and RCA region. He was started on imdur.   Today, he says he has been feeling very good. He states that he has not noticed any difference with his medication change to Imdur. He reports that he has noticed that he has been having more energy and less lethargy for a few months now. Additionally, he states that he has not experienced that much shortness of breath.  However, with his knee pain, he has not been very active. Of note, he has experienced more bilateral LE edema recently than he has in the past several months. This edema has become more common since starting amlodipine. He mentions that he has not been as active as he would have like these past several months due to an injury to his knee. He will be starting back with a trainer soon. He denies any palpitations or chest pain. No lightheadedness, headaches, syncope,  orthopnea, or PND.  Today,  He denies any palpitations, chest pain, shortness of breath, or peripheral edema. No lightheadedness, headaches, syncope, orthopnea, or PND.  (+)  ***Plan: -  Past Medical History:  Diagnosis Date   Allergy    Anemia    history of   Ascending aortic aneurysm ([place]) 05/29/2020   CAD in native artery 05/29/2020   35% LAD on cath.  LDL was 74 on 04/2018.  LDL goal is less than 70.  It was 46 on 12/2019.  Continue rosuvastatin.   Carotid stenosis 05/29/2020   Chronic atrial fibrillation ([place])    Dysrhythmia 1970s   Exertional dyspnea 04/11/2020   Heart murmur    History of colonoscopy 04/2001   negative   Hypertension    Mitral valve prolapse    Nonrheumatic mitral (valve) insufficiency    Osteoarthritis of hip    Permanent atrial fibrillation (HCC)    PONV (postoperative nausea and vomiting)    left knee cartilage removal;     Prostate cancer ([place])    S/P minimally-invasive mitral valve repair 09/28/2019   Complex valvuloplasty including artificial Gore-tex neochord placement x6 with 36 mm Medtronic Sinuform annuloplasty ring via right mini thoracotomy approach   Tricuspid regurgitation    Vertigo 01/07/2017   currently not symptomatic    Past Surgical History:  Procedure Laterality Date   BUBBLE STUDY  08/24/2019   Procedure: BUBBLE STUDY;  Surgeon: [name], MD;  Location: [facility] ENDOSCOPY;  Service: Cardiology;;   CARDIAC CATHETERIZATION  09/28/2019   CLIPPING OF ATRIAL APPENDAGE N/A 09/28/2019   Procedure: CLIPPING OF ATRIAL APPENDAGE USING ATRICURE CLIP SIZE 45MM;  Surgeon: Rexene Alberts, MD;  Location: [place];  Service: Open Heart Surgery;  Laterality: N/A;   COLONOSCOPY     KNEE SURGERY     left at age 33 in [place] Bilateral 06/02/2019   Procedure: LYMPHADENECTOMY, PELVIC;  Surgeon: [name], MD;  Location: [facility];  Service: Urology;  Laterality: Bilateral;   MINIMALLY INVASIVE TRICUSPID VALVE REPAIR Right 09/28/2019    Procedure: possible MINIMALLY INVASIVE TRICUSPID VALVE REPAIR;  Surgeon: Rexene Alberts, MD;  Location: [place];  Service: Open Heart Surgery;  Laterality: Right;   MITRAL VALVE REPAIR Right 09/28/2019   Procedure: MINIMALLY INVASIVE MITRAL VALVE REPAIR (MVR) USING SIMUFORM 36MM;  Surgeon: Rexene Alberts, MD;  Location: [place];  Service: Open Heart Surgery;  Laterality: Right;   PROSTATE BIOPSY     RIGHT/LEFT HEART CATH AND CORONARY ANGIOGRAPHY N/A 09/07/2019   Procedure: RIGHT/LEFT HEART CATH AND CORONARY ANGIOGRAPHY;  Surgeon: Sherren Mocha, MD;  Location: [facility] [facility] CV [facility];  Service: Cardiovascular;  Laterality: N/A;   ROBOT ASSISTED LAPAROSCOPIC RADICAL PROSTATECTOMY N/A 06/02/2019   Procedure: XI ROBOTIC ASSISTED LAPAROSCOPIC RADICAL PROSTATECTOMY LEVEL 2;  Surgeon: [name], MD;  Location: [facility];  Service: Urology;  Laterality: N/A;   spinal injection     December 2013   TEE WITHOUT CARDIOVERSION N/A 08/24/2019   Procedure:  TRANSESOPHAGEAL ECHOCARDIOGRAM (TEE);  Surgeon: [name], MD;  Location: [place];  Service: Cardiology;  Laterality: N/A;   TEE WITHOUT CARDIOVERSION N/A 09/28/2019   Procedure: TRANSESOPHAGEAL ECHOCARDIOGRAM (TEE);  Surgeon: Rexene Alberts, MD;  Location: [place];  Service: Open Heart Surgery;  Laterality: N/A;   TOTAL HIP ARTHROPLASTY Right 01/29/2017   Procedure: RIGHT TOTAL HIP ARTHROPLASTY ANTERIOR APPROACH;  Surgeon: Rod Can, MD;  Location: [facility];  Service: Orthopedics;  Laterality: Right;   TOTAL HIP ARTHROPLASTY Left 06/20/2020   Procedure: TOTAL HIP ARTHROPLASTY ANTERIOR APPROACH;  Surgeon: Rod Can, MD;  Location: [facility];  Service: Orthopedics;  Laterality: Left;   VASECTOMY       No outpatient medications have been marked as taking for the 04/07/22 encounter (Appointment) with [name], MD.     Allergies:   Amlodipine   Social History   Tobacco Use   Smoking status: Never   Smokeless tobacco: Never  Vaping  Use   Vaping Use: Never used  Substance Use Topics   Alcohol use: No   Drug use: No     Family Hx: The patient's family history includes Arrhythmia in his father; Breast cancer in his paternal aunt and sister; Cancer in his mother; Diabetes in his mother; Heart attack in his mother. There is no history of Colon cancer, Esophageal cancer, Stomach cancer, or Rectal cancer.  ROS:   Please see the history of present illness.    All other systems reviewed and are negative.   Prior CV studies:   The following studies were reviewed today:  CT Cardiac PET 11/26/2021:   Findings are consistent with ischemia. The study is low risk.   There is a small mid inferolateral perfusion defect in stress, not present with rest, with decrease in regional stress MPFR (1.59).  This is consistent with ischemia.  This is a small territory of ischemia.   Rest left ventricular function is normal. Stress left ventricular function is normal. End diastolic cavity size is normal. End systolic cavity size is normal.  LVEF Reserve is -2% but suspect this is erroneous in the setting of hyperdynamic function and permanent atrial fibrillation.  Normal TID assessment.   Coronary calcium was present on the attenuation correction CT images. Severe coronary calcifications were present. Coronary calcifications were present in the left anterior descending artery, left circumflex artery and right coronary artery distribution(s).   Annuloplasty ring visualized.   Long Term Monitor 08/2021: 3 Day Zio Monitor   Quality: Fair.  Baseline artifact. Predominant rhythm: Atrial fibrillation Average heart rate: 90 bpm Max heart rate: 207 bpm Min heart rate: 41 bpm Pauses >2.5 seconds: None   100% atrial fibrillation burden <1% PVCs  Up to 10 beats of NSVT vs atrial fibrillation with aberrancy   Echo 04/10/2021: 1. Anbormal septal motion . Left ventricular ejection fraction, by  estimation, is 50 to 55%. The left ventricle has low  normal function. The  left ventricle has no regional wall motion abnormalities. Left ventricular  diastolic parameters are  indeterminate.   2. Right ventricular systolic function is mildly reduced. The right  ventricular size is mildly enlarged.   3. Left atrial size was severely dilated.   4. Right atrial size was severely dilated.   5. Post repair with annuloplasty ring no significant residual MR or MS .  The mitral valve has been repaired/replaced. Trivial mitral valve  regurgitation. No evidence of mitral stenosis.   6. Tricuspid valve regurgitation is moderate.   7.  The aortic valve is tricuspid. There is mild calcification of the  aortic valve. There is mild thickening of the aortic valve. Aortic valve  regurgitation is trivial. Aortic valve sclerosis is present, with no  evidence of aortic valve stenosis.   8. Aortic dilatation noted. There is mild dilatation of the aortic root,  measuring 41 mm. There is mild dilatation of the ascending aorta,  measuring 39 mm.   9. The inferior vena cava is normal in size with greater than 50%  respiratory variability, suggesting right atrial pressure of 3 mmHg.   Comparison(s): EF 50%, AOR 74m, MV myxomatous, MV mean gradient 2.729mG,  RVSP 30.43m57m.    Exercise Tolerance Test 04/27/20: Blood pressure demonstrated a normal response to exercise. Horizontal ST segment depression ST segment depression of 1 mm was noted during stress in the III, II, aVF, V6 and V5 leads, beginning at 4 minutes of stress, and returning to baseline after 1-5 minutes of recovery. Abnormal exercise stress test. Exaggerated heart rate response due to atrial fibrillation. There is borderline ST segment depression during exercise that resolves fairly rapidly in recovery. Although "digoxin effect" is not seen on the baseline tracing, cannot exclude "false positive response" due to digoxin.  14 Day Event Monitor  04/03/20: Quality: Fair.  Baseline  artifact. Predominant rhythm: atrial fibrillation Average heart rate: 86 bpm Max heart rate: 201 bpm Min heart rate: 36 bpm Pauses >2.5 seconds: Pauses up to 4.3 seconds.  Pauses noted overnight/early morning.   Up to 13 beats NSVT.  Max rate 250 bpm.  Echo 09/2019:  1. Normal LV function; s/p MV repair with mild MS (mean gradient 5 mm Hg;  MVA 2.1 cm2) and no MR; moderate biatrial enlargement.   2. Left ventricular ejection fraction, by estimation, is 50 to 55%. The  left ventricle has low normal function. The left ventricle has no regional  wall motion abnormalities.   3. Right ventricular systolic function is normal. The right ventricular  size is normal.   4. Left atrial size was moderately dilated.   5. Right atrial size was moderately dilated.   6. The mitral valve has been repaired/replaced. Trivial mitral valve  regurgitation. Mild mitral stenosis.   7. The aortic valve is tricuspid. Aortic valve regurgitation is not  visualized. No aortic stenosis is present.   8. The inferior vena cava is dilated in size with >50% respiratory  variability, suggesting right atrial pressure of 8 mmHg.   Echo 07/2019: 1. Left ventricular ejection fraction, by estimation, is 50 to 55%. The  left ventricle has low normal function. The left ventricle has no regional  wall motion abnormalities. Left ventricular diastolic function could not  be evaluated.   2. Right ventricular systolic function is normal. The right ventricular  size is normal. There is moderately elevated pulmonary artery systolic  pressure.   3. Left atrial size was severely dilated.   4. Right atrial size was severely dilated.   5. The mitral valve is myxomatous. Moderate to severe mitral valve  regurgitation.   6. Tricuspid valve regurgitation is mild to moderate.   7. The aortic valve is normal in structure. Aortic valve regurgitation is  not visualized. No aortic stenosis is present.   8. Aortic dilatation noted. There  is mild dilatation of the ascending  aorta measuring 38 mm.   LHC 08/2019: 1.  Calcified nonobstructive proximal LAD stenosis 2.  Patent coronary arteries with mild diffuse irregularity and no flow-limiting coronary stenoses 3.  Normal right  heart filling pressures with preserved cardiac output 4.  20 mm V wave consistent with the patient's known mitral regurgitation   Recommendation: Continued plans for cardiac surgical evaluation for mitral valve repair  Carotid Dopplers 09/2019: 1 to 39% ICA stenosis bilaterally.    Labs/Other Tests and Data Reviewed:    EKG: EKG is personally reviewed. 04/07/2022:  ***. Rate *** bpm. 12/30/21: EKG was not ordered. 04/05/21: Atrial fibrillation.  Rate 76 bpm  05/29/20: Atrial Fibrillation. Rate 79 bpm.  04/11/20: Atrial fibrillation.  Rate 88 bpm. 10/14/19: Atrial fibrillation. Rate 80 bpm.  Recent Labs: 09/09/2021: Magnesium 2.2 02/05/2022: ALT 12; BUN 20; Creatinine 1.07; Hemoglobin 12.1; Platelet Count 297; Potassium 4.7; Sodium 142   Recent Lipid Panel Lab Results  Component Value Date/Time   CHOL 166 03/03/2011 09:22 AM   TRIG 41.0 03/03/2011 09:22 AM   HDL 55.80 03/03/2011 09:22 AM   CHOLHDL 3 03/03/2011 09:22 AM   LDLCALC 102 (H) 03/03/2011 09:22 AM    04/30/2018: Total cholesterol 132, triglycerides 64, HDL 54, LDL 73 Sodium 140, BUN 28, creatinine 1.3 AST 18, ALT 15 TSH 1.37  07/08/2018: Hemoglobin 12.2   Wt Readings from Last 3 Encounters:  02/05/22 180 lb 4.8 oz (81.8 kg)  12/30/21 182 lb (82.6 kg)  09/09/21 173 lb 3.2 oz (78.6 kg)     Objective:    VS:  There were no vitals taken for this visit. , BMI There is no height or weight on file to calculate BMI. GENERAL:  Well appearing HEENT: Pupils equal round and reactive, fundi not visualized, oral mucosa unremarkable NECK:  No jugular venous distention, waveform within normal limits, carotid upstroke brisk and symmetric, no bruits LUNGS:  Clear to auscultation  bilaterally HEART: ***Irregularly irregular. PMI not displaced or sustained,S1 and S2 within normal limits, no S3, no S4, no clicks, no rubs, no murmurs ABD:  Flat, positive bowel sounds normal in frequency in pitch, no bruits, no rebound, no guarding, no midline pulsatile mass, no hepatomegaly, no splenomegaly EXT:  2 plus pulses throughout, ***1+ bilateral LE edema, no cyanosis no clubbing SKIN:  No rashes no nodules NEURO:  Cranial nerves II through XII grossly intact, motor grossly intact throughout PSYCH:  Cognitively intact, oriented to person place and time  ASSESSMENT & PLAN:    No problem-specific Assessment & Plan notes found for this encounter.   Disposition: FU with Selma Mink C. Oval Linsey, MD, [facility] in ***3 months.  Medication Adjustments/Labs and Tests Ordered: Current medicines are reviewed at length with the patient today.  Concerns regarding medicines are outlined above.   Tests Ordered: No orders of the defined types were placed in this encounter.  Medication Changes: No orders of the defined types were placed in this encounter.  I,Mathew Stumpf,acting as a Education administrator for [name], MD.,have documented all relevant documentation on the behalf of [name], MD,as directed by  [name], MD while in the presence of [name], MD.  I, Enid Oval Linsey, MD have reviewed all documentation for this visit.  The documentation of the exam, diagnosis, procedures, and orders on 04/04/2022 are all accurate and complete.  Waynetta Pean  04/04/2022 12:25 PM    [facility]

## 2022-04-07 ENCOUNTER — Ambulatory Visit (INDEPENDENT_AMBULATORY_CARE_PROVIDER_SITE_OTHER): Payer: Medicare Other | Admitting: Cardiovascular Disease

## 2022-04-07 ENCOUNTER — Encounter (HOSPITAL_BASED_OUTPATIENT_CLINIC_OR_DEPARTMENT_OTHER): Payer: Self-pay | Admitting: Cardiovascular Disease

## 2022-04-07 VITALS — BP 86/52 | HR 54 | Ht 75.0 in | Wt 180.1 lb

## 2022-04-07 DIAGNOSIS — Z79899 Other long term (current) drug therapy: Secondary | ICD-10-CM | POA: Diagnosis not present

## 2022-04-07 DIAGNOSIS — Z9889 Other specified postprocedural states: Secondary | ICD-10-CM

## 2022-04-07 DIAGNOSIS — I2510 Atherosclerotic heart disease of native coronary artery without angina pectoris: Secondary | ICD-10-CM | POA: Diagnosis not present

## 2022-04-07 DIAGNOSIS — I4821 Permanent atrial fibrillation: Secondary | ICD-10-CM

## 2022-04-07 DIAGNOSIS — Z5181 Encounter for therapeutic drug level monitoring: Secondary | ICD-10-CM | POA: Diagnosis not present

## 2022-04-07 MED ORDER — METOPROLOL TARTRATE 25 MG PO TABS: 25.0000 mg | ORAL_TABLET | Freq: Two times a day (BID) | ORAL | 3 refills | Status: DC

## 2022-04-07 NOTE — Patient Instructions (Signed)
Medication Instructions:  Your physician recommends that you continue on your current medications as directed. Please refer to the Current Medication list given to you today.   *If you need a refill on your cardiac medications before your next appointment, please call your pharmacy*  Lab Work: NONE    Testing/Procedures: NONE  Follow-Up: At [facility], you and your health needs are our priority.  As part of our continuing mission to provide you with exceptional heart care, we have created designated Provider Care Teams.  These Care Teams include your primary Cardiologist (physician) and Advanced Practice Providers (APPs -  Physician Assistants and Nurse Practitioners) who all work together to provide you with the care you need, when you need it.  We recommend signing up for the patient portal called "MyChart".  Sign up information is provided on this After Visit Summary.  MyChart is used to connect with patients for Virtual Visits (Telemedicine).  Patients are able to view lab/test results, encounter notes, upcoming appointments, etc.  Non-urgent messages can be sent to your provider as well.   To learn more about what you can do with MyChart, go to [email or web address].    Your next appointment:   09/29/2022 8:20 AM WITH DR [facility]   Other Instructions MONITOR YOUR BLOOD PRESSURE DAILY. IF YOUR BLOOD PRESSURE IS NOT CONSISTENTLY ABOVE 90 OR IF YOU ARE FEELING POORLY WITH IT IN THE 90'S CALL THE OFFICE

## 2022-04-07 NOTE — Assessment & Plan Note (Signed)
Mild ischemia noted on prior PET CT.  He is doing well with medical management.  Continue Imdur, metoprolol, and rosuvastatin.

## 2022-04-07 NOTE — Assessment & Plan Note (Signed)
Mitral valve was replaced and is doing well.  Normally functioning mitral valve repair on his last echo 03/2021.

## 2022-04-07 NOTE — Assessment & Plan Note (Signed)
Rates are well-controlled.  In fact, he is bradycardic today.  He is unsure exactly what dose of metoprolol he is taking.  We will double check with the pharmacy.  He is otherwise feeling better than he has in quite some time.  Therefore will not make any changes.  He will check his blood pressures and heart rates at home and let us know what they are running over the course of the next week or 2.  Continue digoxin, metoprolol, and Eliquis.  His digoxin level was elevated when previously checked.  However he did take it that morning.  We got a proper repeat value today and the results are pending.

## 2022-04-08 LAB — DIGOXIN LEVEL: Digoxin, Serum: 1 ng/mL — ABNORMAL HIGH (ref 0.5–0.9)

## 2022-04-28 ENCOUNTER — Telehealth (HOSPITAL_BASED_OUTPATIENT_CLINIC_OR_DEPARTMENT_OTHER): Payer: Self-pay | Admitting: *Deleted

## 2022-04-28 ENCOUNTER — Ambulatory Visit (INDEPENDENT_AMBULATORY_CARE_PROVIDER_SITE_OTHER): Payer: Medicare Other

## 2022-04-28 DIAGNOSIS — I361 Nonrheumatic tricuspid (valve) insufficiency: Secondary | ICD-10-CM

## 2022-04-28 DIAGNOSIS — I4821 Permanent atrial fibrillation: Secondary | ICD-10-CM

## 2022-04-28 LAB — ECHOCARDIOGRAM COMPLETE
Area-P 1/2: 3.77 cm2
Est EF: 55
MV VTI: 1.87 cm2
S' Lateral: 2.35 cm

## 2022-04-28 NOTE — Telephone Encounter (Signed)
During his echo his HR was fluctating between 40-60. He reported being asymptomatic and having history of bradycardia. Blood pressure manual 90/60 left arm; right arm machine 99/50. Told him to go to ER if needed.   Above received from [facility] via secure after visit for echo   Will forward to Dr Oval Linsey for review

## 2022-04-29 DIAGNOSIS — D2262 Melanocytic nevi of left upper limb, including shoulder: Secondary | ICD-10-CM | POA: Diagnosis not present

## 2022-04-29 DIAGNOSIS — D485 Neoplasm of uncertain behavior of skin: Secondary | ICD-10-CM | POA: Diagnosis not present

## 2022-04-29 DIAGNOSIS — L570 Actinic keratosis: Secondary | ICD-10-CM | POA: Diagnosis not present

## 2022-04-29 DIAGNOSIS — Z85828 Personal history of other malignant neoplasm of skin: Secondary | ICD-10-CM | POA: Diagnosis not present

## 2022-04-29 DIAGNOSIS — L821 Other seborrheic keratosis: Secondary | ICD-10-CM | POA: Diagnosis not present

## 2022-04-29 DIAGNOSIS — D225 Melanocytic nevi of trunk: Secondary | ICD-10-CM | POA: Diagnosis not present

## 2022-04-29 DIAGNOSIS — D1801 Hemangioma of skin and subcutaneous tissue: Secondary | ICD-10-CM | POA: Diagnosis not present

## 2022-04-29 DIAGNOSIS — C44712 Basal cell carcinoma of skin of right lower limb, including hip: Secondary | ICD-10-CM | POA: Diagnosis not present

## 2022-04-29 DIAGNOSIS — D2261 Melanocytic nevi of right upper limb, including shoulder: Secondary | ICD-10-CM | POA: Diagnosis not present

## 2022-04-29 DIAGNOSIS — C44311 Basal cell carcinoma of skin of nose: Secondary | ICD-10-CM | POA: Diagnosis not present

## 2022-05-04 ENCOUNTER — Encounter: Payer: Self-pay | Admitting: Hematology and Oncology

## 2022-05-06 ENCOUNTER — Other Ambulatory Visit: Payer: Self-pay | Admitting: Hematology and Oncology

## 2022-05-06 DIAGNOSIS — C61 Malignant neoplasm of prostate: Secondary | ICD-10-CM

## 2022-05-07 ENCOUNTER — Inpatient Hospital Stay: Payer: Medicare Other | Attending: Hematology and Oncology

## 2022-05-07 DIAGNOSIS — D4620 Refractory anemia with excess of blasts, unspecified: Secondary | ICD-10-CM | POA: Insufficient documentation

## 2022-05-07 DIAGNOSIS — C61 Malignant neoplasm of prostate: Secondary | ICD-10-CM

## 2022-05-07 DIAGNOSIS — Z8546 Personal history of malignant neoplasm of prostate: Secondary | ICD-10-CM | POA: Diagnosis not present

## 2022-05-07 LAB — CBC WITH DIFFERENTIAL (CANCER CENTER ONLY)
Abs Immature Granulocytes: 0.01 10*3/uL (ref 0.00–0.07)
Basophils Absolute: 0.1 10*3/uL (ref 0.0–0.1)
Basophils Relative: 1 %
Eosinophils Absolute: 0.2 10*3/uL (ref 0.0–0.5)
Eosinophils Relative: 3 %
HCT: 32.8 % — ABNORMAL LOW (ref 39.0–52.0)
Hemoglobin: 11.3 g/dL — ABNORMAL LOW (ref 13.0–17.0)
Immature Granulocytes: 0 %
Lymphocytes Relative: 25 %
Lymphs Abs: 1.1 10*3/uL (ref 0.7–4.0)
MCH: 35.9 pg — ABNORMAL HIGH (ref 26.0–34.0)
MCHC: 34.5 g/dL (ref 30.0–36.0)
MCV: 104.1 fL — ABNORMAL HIGH (ref 80.0–100.0)
Monocytes Absolute: 0.4 10*3/uL (ref 0.1–1.0)
Monocytes Relative: 10 %
Neutro Abs: 2.8 10*3/uL (ref 1.7–7.7)
Neutrophils Relative %: 61 %
Platelet Count: 285 10*3/uL (ref 150–400)
RBC: 3.15 MIL/uL — ABNORMAL LOW (ref 4.22–5.81)
RDW: 18.2 % — ABNORMAL HIGH (ref 11.5–15.5)
WBC Count: 4.6 10*3/uL (ref 4.0–10.5)
nRBC: 0.4 % — ABNORMAL HIGH (ref 0.0–0.2)

## 2022-05-07 LAB — CMP (CANCER CENTER ONLY)
ALT: 12 U/L (ref 0–44)
AST: 20 U/L (ref 15–41)
Albumin: 4.1 g/dL (ref 3.5–5.0)
Alkaline Phosphatase: 46 U/L (ref 38–126)
Anion gap: 4 — ABNORMAL LOW (ref 5–15)
BUN: 17 mg/dL (ref 8–23)
CO2: 30 mmol/L (ref 22–32)
Calcium: 9.4 mg/dL (ref 8.9–10.3)
Chloride: 107 mmol/L (ref 98–111)
Creatinine: 1.19 mg/dL (ref 0.61–1.24)
GFR, Estimated: >60 mL/min (ref >60)
Glucose, Bld: 125 mg/dL — ABNORMAL HIGH (ref 70–99)
Potassium: 4.4 mmol/L (ref 3.5–5.1)
Sodium: 141 mmol/L (ref 135–145)
Total Bilirubin: 2 mg/dL — ABNORMAL HIGH (ref 0.3–1.2)
Total Protein: 6.4 g/dL — ABNORMAL LOW (ref 6.5–8.1)

## 2022-05-07 LAB — LACTATE DEHYDROGENASE: LDH: 113 U/L (ref 98–192)

## 2022-05-09 LAB — PROSTATE-SPECIFIC AG, SERUM (LABCORP): Prostate Specific Ag, Serum: <0.1 ng/mL (ref 0.0–4.0)

## 2022-05-28 ENCOUNTER — Other Ambulatory Visit: Payer: Self-pay | Admitting: Cardiovascular Disease

## 2022-05-29 ENCOUNTER — Telehealth: Payer: Self-pay | Admitting: Cardiovascular Disease

## 2022-05-29 MED ORDER — DIGOXIN 250 MCG PO TABS: 0.2500 mg | ORAL_TABLET | Freq: Every day | ORAL | 3 refills | Status: DC

## 2022-05-29 NOTE — Telephone Encounter (Signed)
Rx request sent to pharmacy.  

## 2022-05-29 NOTE — Telephone Encounter (Signed)
*  STAT* If patient is at the pharmacy, call can be transferred to refill team.   1. Which medications need to be refilled? (please list name of each medication and dose if known)   digoxin (LANOXIN) 0.25 MG tablet    2. Which pharmacy/location (including street and city if local pharmacy) is medication to be sent to?   [id number] - [place], [place] - [address]    3. Do they need a 30 day or 90 day supply? [place]

## 2022-06-30 DIAGNOSIS — C44311 Basal cell carcinoma of skin of nose: Secondary | ICD-10-CM | POA: Diagnosis not present

## 2022-06-30 DIAGNOSIS — Z85828 Personal history of other malignant neoplasm of skin: Secondary | ICD-10-CM | POA: Diagnosis not present

## 2022-07-29 DIAGNOSIS — G629 Polyneuropathy, unspecified: Secondary | ICD-10-CM | POA: Diagnosis not present

## 2022-08-06 ENCOUNTER — Other Ambulatory Visit: Payer: Self-pay

## 2022-08-06 ENCOUNTER — Inpatient Hospital Stay: Payer: Medicare Other | Attending: Hematology and Oncology

## 2022-08-06 ENCOUNTER — Inpatient Hospital Stay (HOSPITAL_BASED_OUTPATIENT_CLINIC_OR_DEPARTMENT_OTHER): Payer: Medicare Other | Admitting: Hematology and Oncology

## 2022-08-06 VITALS — BP 103/59 | HR 71 | Temp 96.2°F | Resp 17 | Ht 75.0 in | Wt 180.2 lb

## 2022-08-06 DIAGNOSIS — C61 Malignant neoplasm of prostate: Secondary | ICD-10-CM | POA: Diagnosis not present

## 2022-08-06 DIAGNOSIS — Z8546 Personal history of malignant neoplasm of prostate: Secondary | ICD-10-CM | POA: Insufficient documentation

## 2022-08-06 DIAGNOSIS — D4620 Refractory anemia with excess of blasts, unspecified: Secondary | ICD-10-CM | POA: Insufficient documentation

## 2022-08-06 DIAGNOSIS — D539 Nutritional anemia, unspecified: Secondary | ICD-10-CM

## 2022-08-06 DIAGNOSIS — Z803 Family history of malignant neoplasm of breast: Secondary | ICD-10-CM | POA: Insufficient documentation

## 2022-08-06 LAB — CBC WITH DIFFERENTIAL (CANCER CENTER ONLY)
Abs Immature Granulocytes: 0.02 10*3/uL (ref 0.00–0.07)
Basophils Absolute: 0.1 10*3/uL (ref 0.0–0.1)
Basophils Relative: 1 %
Eosinophils Absolute: 0.2 10*3/uL (ref 0.0–0.5)
Eosinophils Relative: 3 %
HCT: 33.1 % — ABNORMAL LOW (ref 39.0–52.0)
Hemoglobin: 11.2 g/dL — ABNORMAL LOW (ref 13.0–17.0)
Immature Granulocytes: 0 %
Lymphocytes Relative: 23 %
Lymphs Abs: 1.3 10*3/uL (ref 0.7–4.0)
MCH: 35.9 pg — ABNORMAL HIGH (ref 26.0–34.0)
MCHC: 33.8 g/dL (ref 30.0–36.0)
MCV: 106.1 fL — ABNORMAL HIGH (ref 80.0–100.0)
Monocytes Absolute: 0.5 10*3/uL (ref 0.1–1.0)
Monocytes Relative: 9 %
Neutro Abs: 3.7 10*3/uL (ref 1.7–7.7)
Neutrophils Relative %: 64 %
Platelet Count: 296 10*3/uL (ref 150–400)
RBC: 3.12 MIL/uL — ABNORMAL LOW (ref 4.22–5.81)
RDW: 18.8 % — ABNORMAL HIGH (ref 11.5–15.5)
WBC Count: 5.7 10*3/uL (ref 4.0–10.5)
nRBC: 0.3 % — ABNORMAL HIGH (ref 0.0–0.2)

## 2022-08-06 LAB — CMP (CANCER CENTER ONLY)
ALT: 11 U/L (ref 0–44)
AST: 16 U/L (ref 15–41)
Albumin: 4 g/dL (ref 3.5–5.0)
Alkaline Phosphatase: 54 U/L (ref 38–126)
Anion gap: 4 — ABNORMAL LOW (ref 5–15)
BUN: 17 mg/dL (ref 8–23)
CO2: 30 mmol/L (ref 22–32)
Calcium: 9.2 mg/dL (ref 8.9–10.3)
Chloride: 110 mmol/L (ref 98–111)
Creatinine: 1.05 mg/dL (ref 0.61–1.24)
GFR, Estimated: >60 mL/min (ref >60)
Glucose, Bld: 92 mg/dL (ref 70–99)
Potassium: 4.8 mmol/L (ref 3.5–5.1)
Sodium: 144 mmol/L (ref 135–145)
Total Bilirubin: 1.2 mg/dL (ref 0.3–1.2)
Total Protein: 6.5 g/dL (ref 6.5–8.1)

## 2022-08-06 LAB — LACTATE DEHYDROGENASE: LDH: 126 U/L (ref 98–192)

## 2022-08-06 NOTE — Progress Notes (Signed)
[facility] Telephone:([phone]) [phone]   Fax:([phone]) [phone]  PROGRESS NOTE  Patient Care Team: Rodrigo Ran, MD as PCP - General (Internal Medicine) Chilton Si, MD as PCP - Cardiology (Cardiology) Glendale Chard, DO as Consulting Physician (Neurology) Felicita Gage, RN Nurse Navigator as Registered Nurse (Medical Oncology)  Hematological/Oncological History # Macrocytic Anemia # Low Grade Myelodysplastic Syndrome  03/03/2011: WBC 4.1, Hgb 12.7, MCV 104.6, Plt 203 01/30/2017: WBC 12.1, Hgb 9.2, MCV 100.7, Plt 232 09/28/2019: WBC 24, Hgb 9.3, MCV 107.9, Plt 189 06/21/2020: WBC 10.8, Hgb 8.5, MCV 107.1, Plt 264 01/07/2021: establish care with Dr. Leonides Schanz     02/12/2021: Bmbx showed a hypercellular marrow with dyspoiesis and ring sideroblasts  Interval History:  Raymond Ray 72 y.o. male with medical history significant for low-grade myelodysplastic syndrome who presents for a follow up visit. The patient's last visit was on 02/05/2022. In the interim since the last visit he has had no major changes in his health.  On exam today Mr. Hagedorn notes he did increase his dose of Lyrica but otherwise has had no changes in his health.  He reports that the increased dose of Lyrica is "not perfect but helps".  He notes his energy levels are okay "no better or no worse".  He is not having any bleeding, bruising, or dark stools.  In the winter he does have some occasional light nosebleeding which is easily stopped with a paper towel or tissue.  He is taking Eliquis faithfully.  He notes that he has not had any infectious symptoms such as fevers, chills, sweats, nausea, vomiting or diarrhea.  He denies any cough, shortness of breath, runny nose, or sore throat.  He is not wearing his compression socks in the summer because it is hot and is causing some mild lower extremity edema.  Overall he feels well and has no questions concerns or complaints today.  Full 10 point ROS is listed  below.  MEDICAL HISTORY:  Past Medical History:  Diagnosis Date   Allergy    Anemia    history of   Ascending aortic aneurysm (HCC) 05/29/2020   CAD in native artery 05/29/2020   35% LAD on cath.  LDL was 74 on 04/2018.  LDL goal is less than 70.  It was 46 on 12/2019.  Continue rosuvastatin.   Carotid stenosis 05/29/2020   Chronic atrial fibrillation (HCC)    Dysrhythmia 1970s   Exertional dyspnea 04/11/2020   Heart murmur    History of colonoscopy 04/2001   negative   Hypertension    Mitral valve prolapse    Nonrheumatic mitral (valve) insufficiency    Osteoarthritis of hip    Permanent atrial fibrillation (HCC)    PONV (postoperative nausea and vomiting)    left knee cartilage removal;     Prostate cancer (HCC)    S/P minimally-invasive mitral valve repair 09/28/2019   Complex valvuloplasty including artificial Gore-tex neochord placement x6 with 36 mm Medtronic Sinuform annuloplasty ring via right mini thoracotomy approach   Tricuspid regurgitation    Vertigo 01/07/2017   currently not symptomatic     SURGICAL HISTORY: Past Surgical History:  Procedure Laterality Date   BUBBLE STUDY  08/24/2019   Procedure: BUBBLE STUDY;  Surgeon: Parke Poisson, MD;  Location: [facility] ENDOSCOPY;  Service: Cardiology;;   CARDIAC CATHETERIZATION  09/28/2019   CLIPPING OF ATRIAL APPENDAGE N/A 09/28/2019   Procedure: CLIPPING OF ATRIAL APPENDAGE USING ATRICURE CLIP SIZE ;  Surgeon: Purcell Nails, MD;  Location: [facility] OR;  Service: Open Heart Surgery;  Laterality: N/A;   COLONOSCOPY     KNEE SURGERY     left at age 29 [place]   LYMPHADENECTOMY Bilateral 06/02/2019   Procedure: LYMPHADENECTOMY, PELVIC;  Surgeon: Heloise Purpura, MD;  Location: [facility] ORS;  Service: Urology;  Laterality: Bilateral;   MINIMALLY INVASIVE TRICUSPID VALVE REPAIR Right 09/28/2019   Procedure: possible MINIMALLY INVASIVE TRICUSPID VALVE REPAIR;  Surgeon: Purcell Nails, MD;  Location[facility];  Service: Open Heart Surgery;   Laterality: Right;   MITRAL VALVE REPAIR Right 09/28/2019   Procedure: MINIMALLY INVASIVE MITRAL VALVE REPAIR (MVR) USING SIMUFORM ;  Surgeon: Purcell Nails, MD;  Location: [facility] OR;  Service: Open Heart Surgery;  Laterality: Right;   PROSTATE BIOPSY     RIGHT/LEFT HEART CATH AND CORONARY ANGIOGRAPHY N/A 09/07/2019   Procedure: RIGHT/LEFT HEART CATH AND CORONARY ANGIOGRAPHY;  Surgeon: Tonny Bollman, MD;  Location: [facility] INVASIVE CV LAB;  Service: Cardiovascular;  Laterality: N/A;   ROBOT ASSISTED LAPAROSCOPIC RADICAL PROSTATECTOMY N/A 06/02/2019   Procedure: XI ROBOTIC ASSISTED LAPAROSCOPIC RADICAL PROSTATECTOMY LEVEL 2;  Surgeon: Heloise Purpura, MD;  Location: [facility] ORS;  Service: Urology;  Laterality: N/A;   spinal injection     December 2013   TEE WITHOUT CARDIOVERSION N/A 08/24/2019   Procedure: TRANSESOPHAGEAL ECHOCARDIOGRAM (TEE);  Surgeon: Parke Poisson, MD;  Location: [facility] ENDOSCOPY;  Service: Cardiology;  Laterality: N/A;   TEE WITHOUT CARDIOVERSION N/A 09/28/2019   Procedure: TRANSESOPHAGEAL ECHOCARDIOGRAM (TEE);  Surgeon: Purcell Nails, MD;  Location: [facility] OR;  Service: Open Heart Surgery;  Laterality: N/A;   TOTAL HIP ARTHROPLASTY Right 01/29/2017   Procedure: RIGHT TOTAL HIP ARTHROPLASTY ANTERIOR APPROACH;  Surgeon: Samson Frederic, MD;  Location: [facility] ORS;  Service: Orthopedics;  Laterality: Right;   TOTAL HIP ARTHROPLASTY Left 06/20/2020   Procedure: TOTAL HIP ARTHROPLASTY ANTERIOR APPROACH;  Surgeon: Samson Frederic, MD;  Location: [facility] ORS;  Service: Orthopedics;  Laterality: Left;   VASECTOMY      SOCIAL HISTORY: Social History   Socioeconomic History   Marital status: Married    Spouse name: Lupita Leash   Number of children: 2   Years of education: Not on file   Highest education level: Not on file  Occupational History   Occupation: Retired  Tobacco Use   Smoking status: Never   Smokeless tobacco: Never  Vaping Use   Vaping Use: Never used  Substance and Sexual Activity    Alcohol use: No   Drug use: No   Sexual activity: Yes  Other Topics Concern   Not on file  Social History Narrative   Right handed   Lives in two story home with wife   Daughter, Victorino Dike, resides in [place]. 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 Daughter, French Ana, resides in [place]. 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    Social Determinants of Health   Financial Resource Strain: Not on file  Food Insecurity: Not on file  Transportation Needs: Not on file  Physical Activity: Not on file  Stress: Not on file  Social Connections: Not on file  Intimate Partner Violence: Not on file    FAMILY HISTORY: Family History  Problem Relation Age of Onset   Arrhythmia Father        tachycardia   Cancer Mother    Heart attack Mother    Diabetes Mother    Breast cancer Sister    Breast cancer Paternal Aunt    Colon cancer Neg Hx  Esophageal cancer Neg Hx    Stomach cancer Neg Hx    Rectal cancer Neg Hx     ALLERGIES:  is allergic to amlodipine.  MEDICATIONS:  Current Outpatient Medications  Medication Sig Dispense Refill   apixaban (ELIQUIS) 5 MG TABS tablet TAKE 1 TABLET BY MOUTH TWICE A DAY 180 tablet 1   digoxin (LANOXIN) 0.25 MG tablet Take 1 tablet (0.25 mg total) by mouth daily. 90 tablet 3   fexofenadine (ALLEGRA) 180 MG tablet Take 180 mg by mouth daily as needed for allergies.     hydrocortisone cream 1 % Apply 1 application topically 2 (two) times daily as needed for itching.     isosorbide mononitrate (IMDUR) 30 MG 24 hr tablet Take 0.5 tablets (15 mg total) by mouth daily. 45 tablet 3   metoprolol tartrate (LOPRESSOR) 25 MG tablet Take 1 tablet (25 mg total) by mouth 2 (two) times daily. 180 tablet 3   Multiple Vitamins-Minerals (MULTIVITAMIN WITH MINERALS) tablet Take 1 tablet by mouth daily. [facility] men      polyethylene glycol (MIRALAX / GLYCOLAX) 17 g packet Take 17 g by mouth daily.     pregabalin (LYRICA) 50 MG capsule Take 50 mg by mouth 3 (three) times daily.      rosuvastatin (CRESTOR) 5 MG tablet Take 5 mg by mouth daily.     zolpidem (AMBIEN) 5 MG tablet Take 5 mg by mouth at bedtime.     No current facility-administered medications for this visit.    REVIEW OF SYSTEMS:   Constitutional: ( - ) fevers, ( - )  chills , ( - ) night sweats Eyes: ( - ) blurriness of vision, ( - ) double vision, ( - ) watery eyes Ears, nose, mouth, throat, and face: ( - ) mucositis, ( - ) sore throat Respiratory: ( - ) cough, ( - ) dyspnea, ( - ) wheezes Cardiovascular: ( - ) palpitation, ( - ) chest discomfort, ( - ) lower extremity swelling Gastrointestinal:  ( - ) nausea, ( - ) heartburn, ( - ) change in bowel habits Skin: ( - ) abnormal skin rashes Lymphatics: ( - ) new lymphadenopathy, ( - ) easy bruising Neurological: ( - ) numbness, ( - ) tingling, ( - ) new weaknesses Behavioral/Psych: ( - ) mood change, ( - ) new changes  All other systems were reviewed with the patient and are negative.  PHYSICAL EXAMINATION: Vitals:   08/06/22 0937  BP: (!) 103/59  Pulse: 71  Resp: 17  Temp: (!) 96.2 F (35.7 C)  SpO2: 98%     Filed Weights   08/06/22 0937  Weight: 180 lb 3 oz (81.7 kg)      GENERAL: Well-appearing elderly Caucasian male, alert, no distress and comfortable SKIN: skin color, texture, turgor are normal, no rashes or significant lesions EYES: conjunctiva are pink and non-injected, sclera clear LUNGS: clear to auscultation and percussion with normal breathing effort HEART: regular rate & rhythm and no murmurs and no lower extremity edema Musculoskeletal: no cyanosis of digits and no clubbing  PSYCH: alert & oriented x 3, fluent speech NEURO: no focal motor/sensory deficits  LABORATORY DATA:  I have reviewed the data as listed    Latest Ref Rng & Units 08/06/2022    9:15 AM 05/07/2022   10:22 AM 02/05/2022   10:42 AM  CBC  WBC 4.0 - 10.5 K/uL 5.7  4.6  7.0   Hemoglobin 13.0 - 17.0 g/dL 16.1  09.6  04.5  Hematocrit 39.0 - 52.0 % 33.1   32.8  35.7   Platelets 150 - 400 K/uL 296  285  297        Latest Ref Rng & Units 05/07/2022   10:22 AM 02/05/2022   10:42 AM 10/07/2021   10:49 AM  CMP  Glucose 70 - 99 mg/dL 782  956  213   BUN 8 - 23 mg/dL 17  20  20    Creatinine 0.61 - 1.24 mg/dL 0.86  5.78  4.69   Sodium 135 - 145 mmol/L 141  142  139   Potassium 3.5 - 5.1 mmol/L 4.4  4.7  4.4   Chloride 98 - 111 mmol/L 107  108  108   CO2 22 - 32 mmol/L 30  31  27    Calcium 8.9 - 10.3 mg/dL 9.4  9.9  9.2   Total Protein 6.5 - 8.1 g/dL 6.4  6.5  6.6   Total Bilirubin 0.3 - 1.2 mg/dL 2.0  1.7  1.4   Alkaline Phos 38 - 126 U/L 46  53  49   AST 15 - 41 U/L 20  16  16    ALT 0 - 44 U/L 12  12  12      Lab Results  Component Value Date   MPROTEIN Not Observed 01/07/2021   Lab Results  Component Value Date   KPAFRELGTCHN 27.7 (H) 01/07/2021   LAMBDASER 17.4 01/07/2021   KAPLAMBRATIO 1.59 01/07/2021   RADIOGRAPHIC STUDIES: No results found.  ASSESSMENT & PLAN Raymond Ray 72 y.o. male with medical history significant for low-grade myelodysplastic syndrome who presents for a follow up visit.   # Macrocytic Anemia # Low Grade Myelodysplastic Syndrome -- No evidence of nutritional deficiency or other possible causes of the patient's macrocytic anemia --Bone marrow biopsy was performed on 02/12/2021 and showed evidence of what appears to be a low-grade myelodysplastic syndrome with ring sideroblasts --Patient has had a low-grade anemia and macrocytosis for at least 5 years with evidence of this dating back 10 years.  Appears to be a stable process --Discussed with him that the treatment of choice would be erythropoietin shots if his hemoglobin were to consistently be less than 10. --labs today show white blood cell count 5.7, hemoglobin 11.2, MCV 106.1, and platelets of 296 --Recommend return to clinic in 6 months time to reevaluate with interval 74-month labs  No orders of the defined types were placed in this  encounter.   All questions were answered. The patient knows to call the clinic with any problems, questions or concerns.  A total of more than 25 minutes were spent on this encounter with face-to-face time and non-face-to-face time, including preparing to see the patient, ordering tests and/or medications, counseling the patient and coordination of care as outlined above.   Ulysees Barns, MD Department of Hematology/Oncology [facility] at [facility] Phone: [phone] Pager: [phone] Email: [email or web address]  08/06/2022 9:56 AM

## 2022-08-07 ENCOUNTER — Telehealth: Payer: Self-pay | Admitting: *Deleted

## 2022-08-07 LAB — PROSTATE-SPECIFIC AG, SERUM (LABCORP): Prostate Specific Ag, Serum: <0.1 ng/mL (ref 0.0–4.0)

## 2022-08-07 NOTE — Telephone Encounter (Signed)
TCT patient regarding recent lab results. No answer but was able to leave detailed message on his identified phone vm. Advised that his PSA remains <0.1. We discussed his other labs during the clinic visit. We will plan to see him back in 3 months for labs and 6 months for a clinic visit.  Advised he could call if he had any questions or concerns @ [phone]

## 2022-08-07 NOTE — Telephone Encounter (Signed)
-----   Message from Jaci Standard, MD sent at 08/07/2022  8:25 AM EDT ----- Please let Mr. Prinsen know that his PSA remains <0.1. We discussed his other labs during the clinic visit. We will plan to see him back in 3 months for labs and 6 months for a clinic visit.   ----- Message ----- From: Leory Plowman, Lab In [place] Sent: 08/06/2022   9:29 AM EDT To: Jaci Standard, MD

## 2022-08-13 DIAGNOSIS — C61 Malignant neoplasm of prostate: Secondary | ICD-10-CM | POA: Diagnosis not present

## 2022-08-13 DIAGNOSIS — N5201 Erectile dysfunction due to arterial insufficiency: Secondary | ICD-10-CM | POA: Diagnosis not present

## 2022-08-27 DIAGNOSIS — G609 Hereditary and idiopathic neuropathy, unspecified: Secondary | ICD-10-CM | POA: Diagnosis not present

## 2022-08-27 DIAGNOSIS — D6869 Other thrombophilia: Secondary | ICD-10-CM | POA: Diagnosis not present

## 2022-08-27 DIAGNOSIS — G4700 Insomnia, unspecified: Secondary | ICD-10-CM | POA: Diagnosis not present

## 2022-08-27 DIAGNOSIS — D469 Myelodysplastic syndrome, unspecified: Secondary | ICD-10-CM | POA: Diagnosis not present

## 2022-08-27 DIAGNOSIS — I4811 Longstanding persistent atrial fibrillation: Secondary | ICD-10-CM | POA: Diagnosis not present

## 2022-08-27 DIAGNOSIS — D649 Anemia, unspecified: Secondary | ICD-10-CM | POA: Diagnosis not present

## 2022-08-27 DIAGNOSIS — C61 Malignant neoplasm of prostate: Secondary | ICD-10-CM | POA: Diagnosis not present

## 2022-08-27 DIAGNOSIS — N182 Chronic kidney disease, stage 2 (mild): Secondary | ICD-10-CM | POA: Diagnosis not present

## 2022-08-27 DIAGNOSIS — I2510 Atherosclerotic heart disease of native coronary artery without angina pectoris: Secondary | ICD-10-CM | POA: Diagnosis not present

## 2022-08-27 DIAGNOSIS — E785 Hyperlipidemia, unspecified: Secondary | ICD-10-CM | POA: Diagnosis not present

## 2022-08-27 DIAGNOSIS — I341 Nonrheumatic mitral (valve) prolapse: Secondary | ICD-10-CM | POA: Diagnosis not present

## 2022-09-29 ENCOUNTER — Ambulatory Visit (INDEPENDENT_AMBULATORY_CARE_PROVIDER_SITE_OTHER): Payer: Medicare Other | Admitting: Cardiovascular Disease

## 2022-09-29 ENCOUNTER — Encounter (HOSPITAL_BASED_OUTPATIENT_CLINIC_OR_DEPARTMENT_OTHER): Payer: Self-pay | Admitting: Cardiovascular Disease

## 2022-09-29 VITALS — BP 106/52 | HR 52 | Ht 75.0 in | Wt 177.5 lb

## 2022-09-29 DIAGNOSIS — I2510 Atherosclerotic heart disease of native coronary artery without angina pectoris: Secondary | ICD-10-CM | POA: Diagnosis not present

## 2022-09-29 DIAGNOSIS — Z9889 Other specified postprocedural states: Secondary | ICD-10-CM | POA: Diagnosis not present

## 2022-09-29 DIAGNOSIS — I7121 Aneurysm of the ascending aorta, without rupture: Secondary | ICD-10-CM

## 2022-09-29 DIAGNOSIS — I6529 Occlusion and stenosis of unspecified carotid artery: Secondary | ICD-10-CM | POA: Diagnosis not present

## 2022-09-29 DIAGNOSIS — I4821 Permanent atrial fibrillation: Secondary | ICD-10-CM

## 2022-09-29 NOTE — Patient Instructions (Signed)
  Medication Instructions:  Your physician recommends that you continue on your current medications as directed. Please refer to the Current Medication list given to you today.    *If you need a refill on your cardiac medications before your next appointment, please call your pharmacy*   Lab Work: NONE   Testing/Procedures: NONE   Follow-Up: At [facility], you and your health needs are our priority.  As part of our continuing mission to provide you with exceptional heart care, we have created designated Provider Care Teams.  These Care Teams include your primary Cardiologist (physician) and Advanced Practice Providers (APPs -  Physician Assistants and Nurse Practitioners) who all work together to provide you with the care you need, when you need it.   We recommend signing up for the patient portal called "MyChart".  Sign up information is provided on this After Visit Summary.  MyChart is used to connect with patients for Virtual Visits (Telemedicine).  Patients are able to view lab/test results, encounter notes, upcoming appointments, etc.  Non-urgent messages can be sent to your provider as well.   To learn more about what you can do with MyChart, go to [email or web address].     Your next appointment:   12 month(s)   Provider:   Tiffany Ashe, MD or Caitlin Walker, NP        

## 2022-09-29 NOTE — Progress Notes (Signed)
Cardiology Office Note:  .    Date:  09/29/2022  ID:  Raymond Ray, DOB 30-Mar-1950, MRN [id number] PCP: Rodrigo Ran, MD  [facility] Providers Cardiologist:  Chilton Si, MD     History of Present Illness: .    Raymond Ray is a 72 y.o. male with chronic atrial fibrillation (s/p LAA clipping) and mitral regurgitation 2/2 MVP s/p mitral valve repair, low grade myelodysplastic syndrome, mild carotid stenosis, medically managed CAD, who presents for follow up.  [name] was previously a patient of Dr. Patty Sermons.  He first developed atrial fibrillation in 1986.  He was asymptomatic and it was discovered on physical exam.  He was initially treated with amiodarone and this was discontinued due to concern for long-term toxicity.  He was then on quinidine and this was transitioned to digoxin when he moved from [place] to the [place]. [name] had an echo 10/09/09 that showed normal systolic function and posterior mitral valve prolapse with moderate mitral regurgitation and normal PA pressures.  He had a nuclear stress 04/29/02 showing no ischemia and an ejection fraction of 55%.  He developed epistaxis on Xarelto and switched back to Coumadin with an INR goal of 1.8-2.2.  However,  He switch to Eliquis with no overt bleeding.  He was noted to have a low iron levels and anemia.  He was started on iron supplementation and has continued on Eliquis.  He had a colonoscopy 08/2018 that revealed diverticuli and a 3 mm polyp that was removed.  At his last appointment he reported some mild exertional dyspnea.  He had a repeat echo 07/2019 that revealed LVEF 50 to 55%.  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  He underwent minimally invasive mitral valve repair with Dr. Cornelius Moras on 09/2019.  He had a 36 mm Medtronic Sinuform Ring and the left atrial appendage was clipped.  He was switched back to warfarin for valvular atrial fibrillation.   [name] had fast  heart rate with exercise. He wore a monitor 03/28/20 that showed his heart rate increase to 201 with up to 4 second pauses at night. He had an ETT on 04/27/20 with poorly control heart rate with 1 mm ST depression.  Metoprolol was reduced and digoxin was restarted.  He underwent hip replacement on 05/2020.  He underwent bone marrow biopsy 01/2021 for longstanding microcytic anemia.  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  Given that he had been stable for >10 years they recommended continued observation. He had an Echo 03/2021 that revealed LVEF 50-55%. 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 He followed up with [name], NP, 08/2021 after wearing a monitor that showed 100% atrial fibrillation with an average heart rate of 90 bpm. The digoxin was increased. His monitor did show up to 10 beats of NSVT versus atrial fibrillation with aberrancy. He had a PET scan 11/2021 that showed a small area of ischemia in the mid infra-lateral region. There was severe coronary calcification in the left circumflex, LAD, and RCA region. He was started on imdur.    In 12/2021 he was not feeling differently since Imdur was started. He reported LE edema attributable to amlodipine. Sildenafil and amlodipine were discontinued. After discussion of medical management vs. proceeding with cardiac catheterization he preferred to continue with medical management and increase his exercise.   At his 03/2022 visit, he felt closer to his baseline. His blood pressure was  90/58 initially and 86/52 on recheck in the office. He wasn't monitoring at home. Occasional fatigue in the afternoons but no lightheadedness. He had an echo 04/2022 revealing LVEF 55%, indeterminate diastolic parameters, right ventricular systolic pressure of 43.2 mmHg, trivial mitral and aortic valve regurgitation, moderate tricuspid valve regurgitation. There was borderline dilatation of the aortic root measuring 38 mm. He saw  oncology 07/2022 who recommended erythropoietin shots for hemoglobin less than 10.  Today, he states he is feeling good. His swelling is often dependent on the type of socks he wears. With enough compression his leg swelling is usually well managed. He doesn't monitor his sodium intake but also doesn't add salt to his meals. Typically he takes his medications in the mornings around 7-8 AM. He admits to sometimes forgetting his medications until later, which makes it harder to take his nighttime doses at a consistent time. He continues to exercise routinely, works with a Systems analyst twice weekly (3 PM, Tuesday and Thursday). On the elliptical he is on the 8 setting, previously used 9. Difficult for him to achieve a level of intensity that will cause him to sweat significantly. He denies any palpitations, chest pain, shortness of breath, lightheadedness, headaches, syncope, orthopnea, or PND.  ROS:  Please see the history of present illness. All other systems are reviewed and negative.  (+) Stable LE edema  Studies Reviewed: .        Echo  May 18, 2022:  1. Left ventricular ejection fraction, by estimation, is 55%. The left  ventricle has normal function. The left ventricle has no regional wall  motion abnormalities. Left ventricular diastolic parameters are  indeterminate.   2. Right ventricular systolic function is mildly reduced. The right  ventricular size is mildly enlarged. There is mildly elevated pulmonary  artery systolic pressure. The estimated right ventricular systolic  pressure is 43.2 mmHg.   3. Left atrial size was severely dilated.   4. Right atrial size was severely dilated.   5. The mitral valve has been repaired/replaced. Trivial mitral valve  regurgitation. No evidence of mitral stenosis. The mean mitral valve  gradient is 4.0 mmHg at a HR 47bpm. There is a 36 mm Medtronic Sinuform  annuloplasty ring present in the mitral  position.   6. Tricuspid valve regurgitation is  moderate.   7. The aortic valve is tricuspid. Aortic valve regurgitation is trivial.  Aortic valve sclerosis is present, with no evidence of aortic valve  stenosis.   8. Aortic dilatation noted. There is borderline dilatation of the aortic  root, measuring 38 mm.   9. The inferior vena cava is normal in size with greater than 50%  respiratory variability, suggesting right atrial pressure of 3 mmHg.   Comparison(s): Prior TTE2/2023, LVEF 50-55%, mean mitral gradient ,  moderate TR.   Risk Assessment/Calculations:    CHA2DS2-VASc Score = 2   This indicates a 2.2% annual risk of stroke. The patient's score is based upon: CHF History: 0 HTN History: 0 Diabetes History: 0 Stroke History: 0 Vascular Disease History: 1 Age Score: 1 Gender Score: 0            Physical Exam:    VS:  BP (!) 106/52 (BP Location: Left Arm, Patient Position: Sitting, Cuff Size: Normal)   Pulse (!) 52   Ht 6\' 3"  (1.905 m)   Wt 177 lb 8 oz (80.5 kg)   SpO2 96%   BMI 22.19 kg/m  , BMI Body mass index is 22.19 kg/m. GENERAL:  Well  appearing HEENT: Pupils equal round and reactive, fundi not visualized, oral mucosa unremarkable NECK:  No jugular venous distention, waveform within normal limits, carotid upstroke brisk and symmetric, no bruits, no thyromegaly LUNGS:  Clear to auscultation bilaterally HEART:  Irregularly irregular.  PMI not displaced or sustained,S1 and S2 within normal limits, no S3, no S4, no clicks, no rubs, no murmurs ABD:  Flat, positive bowel sounds normal in frequency in pitch, no bruits, no rebound, no guarding, no midline pulsatile mass, no hepatomegaly, no splenomegaly EXT:  2 plus pulses throughout, no edema, no cyanosis no clubbing SKIN:  No rashes no nodules NEURO:  Cranial nerves II through XII grossly intact, motor grossly intact throughout PSYCH:  Cognitively intact, oriented to person place and time  Wt Readings from Last 3 Encounters:  09/29/22 177 lb 8 oz (80.5 kg)   08/06/22 180 lb 3 oz (81.7 kg)  04/07/22 180 lb 1.6 oz (81.7 kg)     ASSESSMENT AND PLAN: .    # CAD:  Significant coronary calcification on CT.  Stress test with mild ischemia.  He exercises without symptoms.  Lipids well-controlled.  Continue Imdur, metoprolol and rosuvastatin.    # s/p MVR: Stable with no symptoms of heart failure. Last echocardiogram in March 2024 showed good valve function and stable heart pumping function.  Mean gradient 4 mmHg. -Repeat echocardiogram in 2026 unless there are changes in symptoms.  # Exercise Tolerance Decreased intensity of workouts, possibly due to medication or low heart rate. No chest pain or pressure. -Check heart rate during exercise to assess if it is reaching a sufficient rate for a good workout.  # Permanent atrial fibrillation.: Rate stable.  Continue Eliquis, metoprolol and digoxin.   # Prediabetes Last HbA1c in 2021 was 5.7, at the low end of the prediabetes range. -Check HbA1c with primary care physician, Dr. Waynard Edwards.  Follow-up in 1 year or sooner if needed.      Dispo:  FU with  C. Duke Salvia, MD, [facility] in 1 year.  I,Mathew Stumpf,acting as a Neurosurgeon for Chilton Si, MD.,have documented all relevant documentation on the behalf of Chilton Si, MD,as directed by  Chilton Si, MD while in the presence of Chilton Si, MD.  I,  C. Duke Salvia, MD have reviewed all documentation for this visit.  The documentation of the exam, diagnosis, procedures, and orders on 09/29/2022 are all accurate and complete.   Signed, Chilton Si, MD

## 2022-10-23 DIAGNOSIS — H2513 Age-related nuclear cataract, bilateral: Secondary | ICD-10-CM | POA: Diagnosis not present

## 2022-11-03 ENCOUNTER — Other Ambulatory Visit: Payer: Self-pay | Admitting: *Deleted

## 2022-11-03 ENCOUNTER — Encounter: Payer: Self-pay | Admitting: Hematology and Oncology

## 2022-11-03 DIAGNOSIS — C61 Malignant neoplasm of prostate: Secondary | ICD-10-CM

## 2022-11-03 DIAGNOSIS — D539 Nutritional anemia, unspecified: Secondary | ICD-10-CM

## 2022-11-06 ENCOUNTER — Inpatient Hospital Stay: Payer: Medicare Other | Attending: Hematology and Oncology

## 2022-11-06 DIAGNOSIS — C61 Malignant neoplasm of prostate: Secondary | ICD-10-CM

## 2022-11-06 DIAGNOSIS — D539 Nutritional anemia, unspecified: Secondary | ICD-10-CM

## 2022-11-06 DIAGNOSIS — Z8546 Personal history of malignant neoplasm of prostate: Secondary | ICD-10-CM | POA: Insufficient documentation

## 2022-11-06 DIAGNOSIS — D4620 Refractory anemia with excess of blasts, unspecified: Secondary | ICD-10-CM | POA: Insufficient documentation

## 2022-11-06 LAB — CMP (CANCER CENTER ONLY)
ALT: 11 U/L (ref 0–44)
AST: 14 U/L — ABNORMAL LOW (ref 15–41)
Albumin: 4.5 g/dL (ref 3.5–5.0)
Alkaline Phosphatase: 58 U/L (ref 38–126)
Anion gap: 5 (ref 5–15)
BUN: 18 mg/dL (ref 8–23)
CO2: 31 mmol/L (ref 22–32)
Calcium: 9.8 mg/dL (ref 8.9–10.3)
Chloride: 107 mmol/L (ref 98–111)
Creatinine: 1.26 mg/dL — ABNORMAL HIGH (ref 0.61–1.24)
GFR, Estimated: >60 mL/min (ref >60)
Glucose, Bld: 137 mg/dL — ABNORMAL HIGH (ref 70–99)
Potassium: 4.3 mmol/L (ref 3.5–5.1)
Sodium: 143 mmol/L (ref 135–145)
Total Bilirubin: 2 mg/dL — ABNORMAL HIGH (ref 0.3–1.2)
Total Protein: 7 g/dL (ref 6.5–8.1)

## 2022-11-06 LAB — CBC WITH DIFFERENTIAL (CANCER CENTER ONLY)
Abs Immature Granulocytes: 0.01 10*3/uL (ref 0.00–0.07)
Basophils Absolute: 0 10*3/uL (ref 0.0–0.1)
Basophils Relative: 1 %
Eosinophils Absolute: 0.3 10*3/uL (ref 0.0–0.5)
Eosinophils Relative: 5 %
HCT: 35.3 % — ABNORMAL LOW (ref 39.0–52.0)
Hemoglobin: 11.8 g/dL — ABNORMAL LOW (ref 13.0–17.0)
Immature Granulocytes: 0 %
Lymphocytes Relative: 27 %
Lymphs Abs: 1.5 10*3/uL (ref 0.7–4.0)
MCH: 35.3 pg — ABNORMAL HIGH (ref 26.0–34.0)
MCHC: 33.4 g/dL (ref 30.0–36.0)
MCV: 105.7 fL — ABNORMAL HIGH (ref 80.0–100.0)
Monocytes Absolute: 0.6 10*3/uL (ref 0.1–1.0)
Monocytes Relative: 10 %
Neutro Abs: 3.1 10*3/uL (ref 1.7–7.7)
Neutrophils Relative %: 57 %
Platelet Count: 300 10*3/uL (ref 150–400)
RBC: 3.34 MIL/uL — ABNORMAL LOW (ref 4.22–5.81)
RDW: 19.9 % — ABNORMAL HIGH (ref 11.5–15.5)
WBC Count: 5.5 10*3/uL (ref 4.0–10.5)
nRBC: 0.4 % — ABNORMAL HIGH (ref 0.0–0.2)

## 2022-11-06 LAB — LACTATE DEHYDROGENASE: LDH: 129 U/L (ref 98–192)

## 2022-11-07 LAB — PROSTATE-SPECIFIC AG, SERUM (LABCORP): Prostate Specific Ag, Serum: <0.1 ng/mL (ref 0.0–4.0)

## 2022-11-11 ENCOUNTER — Other Ambulatory Visit (HOSPITAL_BASED_OUTPATIENT_CLINIC_OR_DEPARTMENT_OTHER): Payer: Self-pay | Admitting: Cardiovascular Disease

## 2022-11-11 DIAGNOSIS — I4821 Permanent atrial fibrillation: Secondary | ICD-10-CM

## 2022-11-11 NOTE — Telephone Encounter (Signed)
Prescription refill request for Eliquis received. Indication:afib Last office visit:8/24 Scr:1.26  9/24 Age: 72 Weight:80.5  kg  Prescription refilled

## 2022-11-28 ENCOUNTER — Other Ambulatory Visit (HOSPITAL_BASED_OUTPATIENT_CLINIC_OR_DEPARTMENT_OTHER): Payer: Self-pay | Admitting: Family

## 2022-12-31 DIAGNOSIS — Z23 Encounter for immunization: Secondary | ICD-10-CM | POA: Diagnosis not present

## 2023-01-01 ENCOUNTER — Other Ambulatory Visit (HOSPITAL_BASED_OUTPATIENT_CLINIC_OR_DEPARTMENT_OTHER): Payer: Self-pay | Admitting: Family

## 2023-01-01 DIAGNOSIS — I4821 Permanent atrial fibrillation: Secondary | ICD-10-CM

## 2023-01-30 ENCOUNTER — Encounter: Payer: Self-pay | Admitting: Hematology and Oncology

## 2023-02-04 DIAGNOSIS — G629 Polyneuropathy, unspecified: Secondary | ICD-10-CM | POA: Diagnosis not present

## 2023-02-04 NOTE — Progress Notes (Unsigned)
[facility] [phone]   [phone]  PROGRESS NOTE  Patient Care Team: Raymond Ran, MD as PCP - General (Internal Medicine) Raymond Si, MD as PCP - Cardiology (Cardiology) Raymond Chard, DO as Consulting Physician (Neurology) Raymond Gage, RN Nurse Navigator as Registered Nurse (Medical Oncology)  Hematological/Oncological History # Macrocytic Anemia # Low Grade Myelodysplastic Syndrome  03/03/2011: WBC 4.1, Hgb 12.7, MCV 104.6, Plt 203 01/30/2017: WBC 12.1, Hgb 9.2, MCV 100.7, Plt 232 09/28/2019: WBC 24, Hgb 9.3, MCV 107.9, Plt 189 06/21/2020: WBC 10.8, Hgb 8.5, MCV 107.1, Plt 264 01/07/2021: establish care with Dr. Leonides Ray     02/12/2021: Bmbx showed a hypercellular marrow with dyspoiesis and ring sideroblasts  Interval History:  Raymond Ray 72 y.o. male with medical history significant for low-grade myelodysplastic syndrome who presents for a follow up visit. The patient's last visit was on 08/06/2022. In the interim since the last visit he has had no major changes in his health.  On exam today Raymond Ray notes he has been well overall in the interim since her last visit.  He reports his energy levels are "okay" and reports about 8 or 9 out of 10.  He reports that he has been eating well with no problems with his appetite.  He is not having any lightheadedness, dizziness, or shortness of breath.  He is not having any chest pain.  He reports is also had no infectious symptoms such as runny nose, sore throat, or cough.  He does have some occasional nosebleeds because of the cold dry air.  He notes he is not having any bleeding anywhere else such as blood in the urine, blood in the stool, or spontaneous bleeding/bruising.  Overall he feels that his health is steady and has no questions concerns or complaints today.  Full 10 point ROS is listed below.  MEDICAL HISTORY:  Past Medical History:  Diagnosis Date   Allergy    Anemia    history of    Ascending aortic aneurysm (HCC) 05/29/2020   CAD in native artery 05/29/2020   35% LAD on cath.  LDL was 74 on 04/2018.  LDL goal is less than 70.  It was 46 on 12/2019.  Continue rosuvastatin.   Carotid stenosis 05/29/2020   Chronic atrial fibrillation (HCC)    Dysrhythmia 1970s   Exertional dyspnea 04/11/2020   Heart murmur    History of colonoscopy 04/2001   negative   Hypertension    Mitral valve prolapse    Nonrheumatic mitral (valve) insufficiency    Osteoarthritis of hip    Permanent atrial fibrillation (HCC)    PONV (postoperative nausea and vomiting)    left knee cartilage removal;     Prostate cancer (HCC)    S/P minimally-invasive mitral valve repair 09/28/2019   Complex valvuloplasty including artificial Gore-tex neochord placement x6 with 36 mm Medtronic Sinuform annuloplasty ring via right mini thoracotomy approach   Tricuspid regurgitation    Vertigo 01/07/2017   currently not symptomatic     SURGICAL HISTORY: Past Surgical History:  Procedure Laterality Date   BUBBLE STUDY  08/24/2019   Procedure: BUBBLE STUDY;  Surgeon: Parke Poisson, MD;  Location: [facility] ENDOSCOPY;  Service: Cardiology;;   CARDIAC CATHETERIZATION  09/28/2019   CLIPPING OF ATRIAL APPENDAGE N/A 09/28/2019   Procedure: CLIPPING OF ATRIAL APPENDAGE USING ATRICURE CLIP SIZE ;  Surgeon: Purcell Nails, MD;  Location: [facility] OR;  Service: Open Heart Surgery;  Laterality: N/A;   COLONOSCOPY  KNEE SURGERY     left at age 19 [place]   LYMPHADENECTOMY Bilateral 06/02/2019   Procedure: LYMPHADENECTOMY, PELVIC;  Surgeon: Heloise Purpura, MD;  Location: [facility] ORS;  Service: Urology;  Laterality: Bilateral;   MINIMALLY INVASIVE TRICUSPID VALVE REPAIR Right 09/28/2019   Procedure: possible MINIMALLY INVASIVE TRICUSPID VALVE REPAIR;  Surgeon: Purcell Nails, MD;  Location: [facility] OR;  Service: Open Heart Surgery;  Laterality: Right;   MITRAL VALVE REPAIR Right 09/28/2019   Procedure: MINIMALLY INVASIVE MITRAL VALVE  REPAIR (MVR) USING SIMUFORM ;  Surgeon: Purcell Nails, MD;  Location: [facility] OR;  Service: Open Heart Surgery;  Laterality: Right;   PROSTATE BIOPSY     RIGHT/LEFT HEART CATH AND CORONARY ANGIOGRAPHY N/A 09/07/2019   Procedure: RIGHT/LEFT HEART CATH AND CORONARY ANGIOGRAPHY;  Surgeon: Tonny Bollman, MD;  Location: [facility] INVASIVE CV LAB;  Service: Cardiovascular;  Laterality: N/A;   ROBOT ASSISTED LAPAROSCOPIC RADICAL PROSTATECTOMY N/A 06/02/2019   Procedure: XI ROBOTIC ASSISTED LAPAROSCOPIC RADICAL PROSTATECTOMY LEVEL 2;  Surgeon: Heloise Purpura, MD;  Location: [facility] ORS;  Service: Urology;  Laterality: N/A;   spinal injection     December 2013   TEE WITHOUT CARDIOVERSION N/A 08/24/2019   Procedure: TRANSESOPHAGEAL ECHOCARDIOGRAM (TEE);  Surgeon: Parke Poisson, MD;  Location: [facility] ENDOSCOPY;  Service: Cardiology;  Laterality: N/A;   TEE WITHOUT CARDIOVERSION N/A 09/28/2019   Procedure: TRANSESOPHAGEAL ECHOCARDIOGRAM (TEE);  Surgeon: Purcell Nails, MD;  Location: [facility] OR;  Service: Open Heart Surgery;  Laterality: N/A;   TOTAL HIP ARTHROPLASTY Right 01/29/2017   Procedure: RIGHT TOTAL HIP ARTHROPLASTY ANTERIOR APPROACH;  Surgeon: Samson Frederic, MD;  Location: [facility] ORS;  Service: Orthopedics;  Laterality: Right;   TOTAL HIP ARTHROPLASTY Left 06/20/2020   Procedure: TOTAL HIP ARTHROPLASTY ANTERIOR APPROACH;  Surgeon: Samson Frederic, MD;  Location: [facility] ORS;  Service: Orthopedics;  Laterality: Left;   VASECTOMY      SOCIAL HISTORY: Social History   Socioeconomic History   Marital status: Married    Spouse name: Raymond Ray   Number of children: 2   Years of education: Not on file   Highest education level: Not on file  Occupational History   Occupation: Retired  Tobacco Use   Smoking status: Never   Smokeless tobacco: Never  Vaping Use   Vaping status: Never Used  Substance and Sexual Activity   Alcohol use: No   Drug use: No   Sexual activity: Yes  Other Topics Concern   Not on file  Social  History Narrative   Right handed   Lives in two story home with wife   Daughter, Victorino Dike, resides in [place]. 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 Daughter, French Ana, resides in [place]. 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    Social Drivers of Corporate investment banker Strain: Not on file  Food Insecurity: Not on file  Transportation Needs: Not on file  Physical Activity: Not on file  Stress: Not on file  Social Connections: Not on file  Intimate Partner Violence: Not on file    FAMILY HISTORY: Family History  Problem Relation Age of Onset   Arrhythmia Father        tachycardia   Cancer Mother    Heart attack Mother    Diabetes Mother    Breast cancer Sister    Breast cancer Paternal Aunt    Colon cancer Neg Hx    Esophageal cancer Neg Hx    Stomach cancer Neg Hx    Rectal cancer Neg  Hx     ALLERGIES:  is allergic Ray amlodipine.  MEDICATIONS:  Current Outpatient Medications  Medication Sig Dispense Refill   digoxin (LANOXIN) 0.25 MG tablet Take 1 tablet (0.25 mg total) by mouth daily. 90 tablet 3   ELIQUIS 5 MG TABS tablet TAKE 1 TABLET BY MOUTH TWICE A DAY 180 tablet 1   fexofenadine (ALLEGRA) 180 MG tablet Take 180 mg by mouth daily as needed for allergies.     hydrocortisone cream 1 % Apply 1 application topically 2 (two) times daily as needed for itching.     isosorbide mononitrate (IMDUR) 30 MG 24 hr tablet TAKE 1/2 OF A TABLET (15 MG TOTAL) BY MOUTH DAILY 45 tablet 3   metoprolol tartrate (LOPRESSOR) 25 MG tablet 1 AND 1/2 TABLETS BY MOUTH TWICE A DAY 270 tablet 2   Multiple Vitamins-Minerals (MULTIVITAMIN WITH MINERALS) tablet Take 1 tablet by mouth daily. [facility] men      polyethylene glycol (MIRALAX / GLYCOLAX) 17 g packet Take 17 g by mouth daily.     pregabalin (LYRICA) 50 MG capsule Take 50 mg by mouth 4 (four) times daily.     rosuvastatin (CRESTOR) 5 MG tablet Take 5 mg by mouth daily.     zolpidem (AMBIEN) 5 MG tablet Take 5 mg by mouth at bedtime.     No current  facility-administered medications for this visit.    REVIEW OF SYSTEMS:   Constitutional: ( - ) fevers, ( - )  chills , ( - ) night sweats Eyes: ( - ) blurriness of vision, ( - ) double vision, ( - ) watery eyes Ears, nose, mouth, throat, and face: ( - ) mucositis, ( - ) sore throat Respiratory: ( - ) cough, ( - ) dyspnea, ( - ) wheezes Cardiovascular: ( - ) palpitation, ( - ) chest discomfort, ( - ) lower extremity swelling Gastrointestinal:  ( - ) nausea, ( - ) heartburn, ( - ) change in bowel habits Skin: ( - ) abnormal skin rashes Lymphatics: ( - ) new lymphadenopathy, ( - ) easy bruising Neurological: ( - ) numbness, ( - ) tingling, ( - ) new weaknesses Behavioral/Psych: ( - ) mood change, ( - ) new changes  All other systems were reviewed with the patient and are negative.  PHYSICAL EXAMINATION: Vitals:   02/05/23 1015  BP: (!) 105/52  Pulse: (!) 35  Resp: 16  Temp: 98 F (36.7 C)  SpO2: 100%   Filed Weights   02/05/23 1015  Weight: 176 lb 1.6 oz (79.9 kg)    GENERAL: Well-appearing elderly Caucasian male, alert, no distress and comfortable SKIN: skin color, texture, turgor are normal, no rashes or significant lesions EYES: conjunctiva are pink and non-injected, sclera clear LUNGS: clear Ray auscultation and percussion with normal breathing effort HEART: regular rate & rhythm and no murmurs and no lower extremity edema Musculoskeletal: no cyanosis of digits and no clubbing  PSYCH: alert & oriented x 3, fluent speech NEURO: no focal motor/sensory deficits  LABORATORY DATA:  I have reviewed the data as listed    Latest Ref Rng & Units 02/05/2023    9:32 AM 11/06/2022    9:15 AM 08/06/2022    9:15 AM  CBC  WBC 4.0 - 10.5 K/uL 4.8  5.5  5.7   Hemoglobin 13.0 - 17.0 g/dL 19.1  47.8  29.5   Hematocrit 39.0 - 52.0 % 33.4  35.3  33.1   Platelets 150 - 400 K/uL 298  300  296        Latest Ref Rng & Units 02/05/2023    9:32 AM 11/06/2022    9:15 AM 08/06/2022    9:15  AM  CMP  Glucose 70 - 99 mg/dL 295  284  92   BUN 8 - 23 mg/dL 21  18  17    Creatinine 0.61 - 1.24 mg/dL 1.32  4.40  1.02   Sodium 135 - 145 mmol/L 143  143  144   Potassium 3.5 - 5.1 mmol/L 4.4  4.3  4.8   Chloride 98 - 111 mmol/L 108  107  110   CO2 22 - 32 mmol/L 29  31  30    Calcium 8.9 - 10.3 mg/dL 9.3  9.8  9.2   Total Protein 6.5 - 8.1 g/dL 6.7  7.0  6.5   Total Bilirubin <1.2 mg/dL 1.6  2.0  1.2   Alkaline Phos 38 - 126 U/L 56  58  54   AST 15 - 41 U/L 14  14  16    ALT 0 - 44 U/L 10  11  11      Lab Results  Component Value Date   MPROTEIN Not Observed 01/07/2021   Lab Results  Component Value Date   KPAFRELGTCHN 27.7 (H) 01/07/2021   LAMBDASER 17.4 01/07/2021   KAPLAMBRATIO 1.59 01/07/2021   RADIOGRAPHIC STUDIES: No results found.  ASSESSMENT & PLAN Raymond Ray 72 y.o. male with medical history significant for low-grade myelodysplastic syndrome who presents for a follow up visit.   # Macrocytic Anemia # Low Grade Myelodysplastic Syndrome -- No evidence of nutritional deficiency or other possible causes of the patient's macrocytic anemia --Bone marrow biopsy was performed on 02/12/2021 and showed evidence of what appears Ray be a low-grade myelodysplastic syndrome with ring sideroblasts --Patient has had a low-grade anemia and macrocytosis for at least 5 years with evidence of this dating back 10 years.  Appears Ray be a stable process --Discussed with him that the treatment of choice would be erythropoietin shots if his hemoglobin were Ray consistently be less than 10.  No clear indication for treatment at this time. --labs today show white blood cell count 4.8, Hgb 11.5, MCV 104.4, Plt 298 --Recommend return Ray clinic in 6 months time Ray reevaluate  # Bradycardia -- Patient noted Ray have a heart rate in the 30s, confirmed with EKG Ray have heart rate of 36. -- Called patient's cardiology office who recommended holding his digoxin and metoprolol and scheduled  with a follow-up with cardiology.  Orders Placed This Encounter  Procedures   EKG 12-Lead    Ordered by an unspecified provider     All questions were answered. The patient knows Ray call the clinic with any problems, questions or concerns.  A total of more than 25 minutes were spent on this encounter with face-Ray-face time and non-face-Ray-face time, including preparing Ray see the patient, ordering tests and/or medications, counseling the patient and coordination of care as outlined above.   Ulysees Barns, MD Department of Hematology/Oncology [facility] at [facility] Phone: [phone] Pager: [phone] Email: [email or web address]  02/05/2023 5:12 PM

## 2023-02-05 ENCOUNTER — Telehealth: Payer: Self-pay | Admitting: Cardiovascular Disease

## 2023-02-05 ENCOUNTER — Telehealth: Payer: Self-pay

## 2023-02-05 ENCOUNTER — Inpatient Hospital Stay: Payer: Medicare Other | Admitting: Hematology and Oncology

## 2023-02-05 ENCOUNTER — Telehealth: Payer: Self-pay | Admitting: *Deleted

## 2023-02-05 ENCOUNTER — Inpatient Hospital Stay: Payer: Medicare Other | Attending: Hematology and Oncology

## 2023-02-05 VITALS — BP 105/52 | HR 35 | Temp 98.0°F | Resp 16 | Wt 176.1 lb

## 2023-02-05 DIAGNOSIS — Z803 Family history of malignant neoplasm of breast: Secondary | ICD-10-CM | POA: Diagnosis not present

## 2023-02-05 DIAGNOSIS — D461 Refractory anemia with ring sideroblasts: Secondary | ICD-10-CM | POA: Diagnosis not present

## 2023-02-05 DIAGNOSIS — Z8546 Personal history of malignant neoplasm of prostate: Secondary | ICD-10-CM | POA: Insufficient documentation

## 2023-02-05 DIAGNOSIS — D539 Nutritional anemia, unspecified: Secondary | ICD-10-CM | POA: Insufficient documentation

## 2023-02-05 DIAGNOSIS — R001 Bradycardia, unspecified: Secondary | ICD-10-CM | POA: Diagnosis not present

## 2023-02-05 DIAGNOSIS — C61 Malignant neoplasm of prostate: Secondary | ICD-10-CM

## 2023-02-05 LAB — CBC WITH DIFFERENTIAL (CANCER CENTER ONLY)
Abs Immature Granulocytes: 0.01 10*3/uL (ref 0.00–0.07)
Basophils Absolute: 0.1 10*3/uL (ref 0.0–0.1)
Basophils Relative: 1 %
Eosinophils Absolute: 0.2 10*3/uL (ref 0.0–0.5)
Eosinophils Relative: 4 %
HCT: 33.4 % — ABNORMAL LOW (ref 39.0–52.0)
Hemoglobin: 11.5 g/dL — ABNORMAL LOW (ref 13.0–17.0)
Immature Granulocytes: 0 %
Lymphocytes Relative: 31 %
Lymphs Abs: 1.5 10*3/uL (ref 0.7–4.0)
MCH: 35.9 pg — ABNORMAL HIGH (ref 26.0–34.0)
MCHC: 34.4 g/dL (ref 30.0–36.0)
MCV: 104.4 fL — ABNORMAL HIGH (ref 80.0–100.0)
Monocytes Absolute: 0.6 10*3/uL (ref 0.1–1.0)
Monocytes Relative: 12 %
Neutro Abs: 2.5 10*3/uL (ref 1.7–7.7)
Neutrophils Relative %: 52 %
Platelet Count: 298 10*3/uL (ref 150–400)
RBC: 3.2 MIL/uL — ABNORMAL LOW (ref 4.22–5.81)
RDW: 19.9 % — ABNORMAL HIGH (ref 11.5–15.5)
WBC Count: 4.8 10*3/uL (ref 4.0–10.5)
nRBC: 0 % (ref 0.0–0.2)

## 2023-02-05 LAB — CMP (CANCER CENTER ONLY)
ALT: 10 U/L (ref 0–44)
AST: 14 U/L — ABNORMAL LOW (ref 15–41)
Albumin: 4.3 g/dL (ref 3.5–5.0)
Alkaline Phosphatase: 56 U/L (ref 38–126)
Anion gap: 6 (ref 5–15)
BUN: 21 mg/dL (ref 8–23)
CO2: 29 mmol/L (ref 22–32)
Calcium: 9.3 mg/dL (ref 8.9–10.3)
Chloride: 108 mmol/L (ref 98–111)
Creatinine: 1.14 mg/dL (ref 0.61–1.24)
GFR, Estimated: >60 mL/min (ref >60)
Glucose, Bld: 120 mg/dL — ABNORMAL HIGH (ref 70–99)
Potassium: 4.4 mmol/L (ref 3.5–5.1)
Sodium: 143 mmol/L (ref 135–145)
Total Bilirubin: 1.6 mg/dL — ABNORMAL HIGH (ref <1.2)
Total Protein: 6.7 g/dL (ref 6.5–8.1)

## 2023-02-05 LAB — LACTATE DEHYDROGENASE: LDH: 117 U/L (ref 98–192)

## 2023-02-05 NOTE — Telephone Encounter (Signed)
TCT patient's cardiologist Dr. Chilton Si as pt came here for a scheduled appt with Dr. Leonides Schanz. HR noted to be low today in the high 30's. ECG done . This revealed HR of 36, "junctional bradycardia", " non specific ST abnormality" VSS BP 105/52. Pt denies SOB, Chest pain. Admits to dizziness when stands up. Spoke to [place] in Triage. She will contact MD and call back with recommendations.  Received call back from Dr. Cristal Deer, cardiology. Reviewed pt's current situation. She advised to stop digoxin completely and then to hold metoprolol x 3 doses. They will call him likely tomorrow to set up f/u appt with him. Instructed pt on the above recommendations. Pt states he took the digoxin this morning and the metoprolol. Advised no more digoxin and to not take 2nd dose of metoprolol today and neither of his 2 doses tomorrow. Advised that his cardiology office will be in touch with him in the next day or 2 for follow up. Pt voiced understanding.  Dr. Leonides Schanz aware of the above.

## 2023-02-05 NOTE — Telephone Encounter (Signed)
Received call at [facility] triage from [place], [place] at Dr. Derek Mound office regarding this patient. [name] states that patient is lightheaded w/ HR of 36. He is seen by Dr. Leonides Schanz for MDS and Dr. Leonides Schanz would like to discuss with DOD. Patient is seen by Dr. Duke Salvia at [facility]. Call/secure chat to [facility] DOD team, provided direct callback # [phone] and advised that per [facility], she would keep patient there at Dr. Derek Mound office til they hear back from cardiology or they would send patient to ED.

## 2023-02-05 NOTE — Telephone Encounter (Signed)
Cardiology DOD call  Returned call to Dr. Derek Mound office, patient of Dr. Duke Salvia. Patient in for treatment, noted to have low heart rate. ECG done, shows regularized atrial fibrillation. He is reportedly tired but blood pressure stable, no syncope.  Given appearance of ECG, would stop digoxin permanently. Would hold metoprolol for today and tomorrow. Would have him check home BP and HR tomorrow. May be able to add back a lower dose of metoprolol based on his vitals.  Nurse at Dr. Derek Mound office will discuss recommendations with him. Will also have our team contact him to confirm the instructions. Will arrange for follow up visit in several weeks with our NP. Reviewed that loss of consciousness is red flag that needs urgent ER evaluation.  Jodelle Red, MD, PhD, [facility]  [facility]  [place]  [facility] at [facility] at [facility] [address], [address] [place], [place] [address] [phone]

## 2023-02-05 NOTE — Telephone Encounter (Signed)
Called patient regarding instructions from Dr. Cristal Deer to confirm understanding of results. Left message for patient to call office.

## 2023-02-05 NOTE — Telephone Encounter (Signed)
Called patient to follow up on instructions given by Dr. Cristal Deer. Left message for patient to call office.

## 2023-02-05 NOTE — Telephone Encounter (Signed)
Dr. Derek Mound office is on the line wanting to talk with triage in regards to patient's HR which was at 20

## 2023-02-06 ENCOUNTER — Telehealth (HOSPITAL_BASED_OUTPATIENT_CLINIC_OR_DEPARTMENT_OTHER): Payer: Self-pay | Admitting: Cardiovascular Disease

## 2023-02-06 ENCOUNTER — Encounter (HOSPITAL_BASED_OUTPATIENT_CLINIC_OR_DEPARTMENT_OTHER): Payer: Self-pay | Admitting: Cardiovascular Disease

## 2023-02-06 LAB — PROSTATE-SPECIFIC AG, SERUM (LABCORP): Prostate Specific Ag, Serum: <0.1 ng/mL (ref 0.0–4.0)

## 2023-02-06 NOTE — Telephone Encounter (Signed)
If patient calls back please see MyChart message with instructions.   Alver Sorrow, NP

## 2023-02-06 NOTE — Telephone Encounter (Signed)
Patient is returning nurse call from yesterday.

## 2023-02-06 NOTE — Telephone Encounter (Signed)
 Called patient with no answer. Left message to return call.

## 2023-02-09 NOTE — Telephone Encounter (Signed)
See 12/13 mychart message patient responded

## 2023-02-23 ENCOUNTER — Ambulatory Visit (HOSPITAL_BASED_OUTPATIENT_CLINIC_OR_DEPARTMENT_OTHER): Payer: Medicare Other | Admitting: Family

## 2023-02-23 ENCOUNTER — Encounter (HOSPITAL_BASED_OUTPATIENT_CLINIC_OR_DEPARTMENT_OTHER): Payer: Self-pay | Admitting: Family

## 2023-02-23 VITALS — BP 124/60 | HR 75 | Ht 75.0 in | Wt 176.5 lb

## 2023-02-23 DIAGNOSIS — D6859 Other primary thrombophilia: Secondary | ICD-10-CM

## 2023-02-23 DIAGNOSIS — I2510 Atherosclerotic heart disease of native coronary artery without angina pectoris: Secondary | ICD-10-CM

## 2023-02-23 DIAGNOSIS — Z9889 Other specified postprocedural states: Secondary | ICD-10-CM | POA: Diagnosis not present

## 2023-02-23 DIAGNOSIS — I4821 Permanent atrial fibrillation: Secondary | ICD-10-CM | POA: Diagnosis not present

## 2023-02-23 DIAGNOSIS — E785 Hyperlipidemia, unspecified: Secondary | ICD-10-CM | POA: Diagnosis not present

## 2023-02-23 MED ORDER — METOPROLOL TARTRATE 25 MG PO TABS: ORAL_TABLET | ORAL | 0 refills | Status: DC

## 2023-02-23 NOTE — Progress Notes (Signed)
Cardiology Office Note:  .   Date:  02/23/2023  ID:  [name], DOB 08-31-1950, MRN [id number] PCP: Rodrigo Ran, MD  [facility] Providers Cardiologist:  Chilton Si, MD    History of Present Illness: .   Kapena Walla is a 72 y.o. male with a history of coronary artery disease, hyperlipidemia, MR secondary to MVP s/p MV repair and LAA clipping, permanent atrial fibrillation, prediabetes.   Prior patient of Dr. Patty Sermons.  Initially developed atrial fibrillation 1996.  Initially treated with amiodarone which was discontinued due to concern for long-term toxicity.  Been on quinidine and transition to digoxin when he moved from [place] to [place].  Previously developed epistaxis with Xarelto and was transition back to Coumadin.  However he was then transitioned to Eliquis with no overt bleeding.  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  Underwent minimally invasive MV repair with Dr. Cyndie Chime 09/2019 with 36 mm Medtronic ScinoPharm ring and left atrial appendage clip.    Monitor February 2022 with heart rate up to 201 bpm with one 4-second pause at night.  ETT 04/27/2020 poorly controlled heart rate with 1 mm ST depression.  Metoprolol reduced and digoxin resumed.  Bone marrow biopsy 01/2021 for longstanding microcytic anemia.  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  Most recent echo 04/2022 normal LVEF 55%, RVSF mildly reduced, MV ring functioning well, moderate TR stable from prior.   Seen in Dr. Derek Mound office 02/05/23 with heart rate in the 30s. Digoxin stopped, Metoprolol placed on hold.   Send today for follow-up. 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 Exercising regularly on elliptical. This has been a bit easier since stopping the Metoprolol. Reports no palpitations, he is unaware of his atrial fibrillation. Heart rates at home  have been 60-70s.   ROS: Please see the history of present illness.    All other systems reviewed and are negative.   Studies Reviewed: .           Risk Assessment/Calculations:    CHA2DS2-VASc Score = 2   This indicates a 2.2% annual risk of stroke. The patient's score is based upon: CHF History: 0 HTN History: 0 Diabetes History: 0 Stroke History: 0 Vascular Disease History: 1 Age Score: 1 Gender Score: 0            Physical Exam:   VS:  BP 124/60   Pulse 75   Ht 6\' 3"  (1.905 m)   Wt 176 lb 8 oz (80.1 kg)   SpO2 97%   BMI 22.06 kg/m    Wt Readings from Last 3 Encounters:  02/23/23 176 lb 8 oz (80.1 kg)  02/05/23 176 lb 1.6 oz (79.9 kg)  09/29/22 177 lb 8 oz (80.5 kg)    GEN: Well nourished, well developed in no acute distress NECK: No JVD; No carotid bruits CARDIAC: IRIR, no murmurs, rubs, gallops RESPIRATORY:  Clear to auscultation without rales, wheezing or rhonchi  ABDOMEN: Soft, non-tender, non-distended EXTREMITIES:  No edema; No deformity   ASSESSMENT AND PLAN: .    Permanent atrial fibrillation/hypercoagulable state - Digoxin, metoprolol recently stopped due to profound bradycardia 36 bpm. Heart rate since then 60-70s with appropriate rise with exercise on elliptical. Remain off digoxin. Will utilize Metoprolol tartrate 12.5mg  only PRN for heart rate at rest >100bpm for 30 minutes. No indication for PPM at this time as  bradycardia resolves after discontinuation of AV nodal blocking agents.  CHA2DS2-VASc Score = 2 [CHF History: 0, HTN History: 0, Diabetes History: 0, Stroke History: 0, Vascular Disease History: 1, Age Score: 1, Gender Score: 0].  Therefore, the patient's annual risk of stroke is 2.2 %.    Continue Eliquis 5mg  BID. Does not meet dose reduction criteria.   CAD/HLD, LDL goal less than 70- Stable with no anginal symptoms. No indication for ischemic evaluation.  GDMT Rosuvastatin. No BB due to bradycardia, no ASA due to [facility]. Recommend aiming for  150 minutes of moderate intensity activity per week and following a heart healthy diet.    S/p MV repair-echo 04/2022 good valve function, mean gradient 4 mmHg and normal LVEF.  Repeat echo 2026 per Dr. Duke Salvia unless new symptoms.   Prediabetes- Continue to follow with PCP.        Dispo: follow up with Dr. Duke Salvia in 6 months  Signed, Alver Sorrow, NP

## 2023-02-23 NOTE — Patient Instructions (Addendum)
Medication Instructions:   CHANGE Metoprolol tartrate to 12.5mg  (as needed) for heart rate at rest of more than 100 bpm for 30 minutes  *If you need a refill on your cardiac medications before your next appointment, please call your pharmacy*  Follow-Up: At [facility], you and your health needs are our priority.  As part of our continuing mission to provide you with exceptional heart care, we have created designated Provider Care Teams.  These Care Teams include your primary Cardiologist (physician) and Advanced Practice Providers (APPs -  Physician Assistants and Nurse Practitioners) who all work together to provide you with the care you need, when you need it.  We recommend signing up for the patient portal called "MyChart".  Sign up information is provided on this After Visit Summary.  MyChart is used to connect with patients for Virtual Visits (Telemedicine).  Patients are able to view lab/test results, encounter notes, upcoming appointments, etc.  Non-urgent messages can be sent to your provider as well.   To learn more about what you can do with MyChart, go to [email or web address].    Your next appointment:   6 month(s)  Provider:   Chilton Si, MD or [name], NP    Other Instructions  Blood pressure goal is less than 130/80. If you see your blood pressure is consistently more than 130/80, simply call and let us know.   Heart rate goal when resting is to be less than 100 bpm. We would expect your heart rate to go up with exercise - that is normal and reassuring.

## 2023-03-25 DIAGNOSIS — C61 Malignant neoplasm of prostate: Secondary | ICD-10-CM | POA: Diagnosis not present

## 2023-03-25 DIAGNOSIS — N5201 Erectile dysfunction due to arterial insufficiency: Secondary | ICD-10-CM | POA: Diagnosis not present

## 2023-03-27 DIAGNOSIS — Z1212 Encounter for screening for malignant neoplasm of rectum: Secondary | ICD-10-CM | POA: Diagnosis not present

## 2023-03-27 DIAGNOSIS — E785 Hyperlipidemia, unspecified: Secondary | ICD-10-CM | POA: Diagnosis not present

## 2023-03-27 DIAGNOSIS — C61 Malignant neoplasm of prostate: Secondary | ICD-10-CM | POA: Diagnosis not present

## 2023-03-27 DIAGNOSIS — I2510 Atherosclerotic heart disease of native coronary artery without angina pectoris: Secondary | ICD-10-CM | POA: Diagnosis not present

## 2023-03-27 DIAGNOSIS — E611 Iron deficiency: Secondary | ICD-10-CM | POA: Diagnosis not present

## 2023-03-27 DIAGNOSIS — D649 Anemia, unspecified: Secondary | ICD-10-CM | POA: Diagnosis not present

## 2023-04-03 ENCOUNTER — Encounter: Payer: Self-pay | Admitting: Internal Medicine

## 2023-04-03 DIAGNOSIS — C61 Malignant neoplasm of prostate: Secondary | ICD-10-CM | POA: Diagnosis not present

## 2023-04-03 DIAGNOSIS — I341 Nonrheumatic mitral (valve) prolapse: Secondary | ICD-10-CM | POA: Diagnosis not present

## 2023-04-03 DIAGNOSIS — D6869 Other thrombophilia: Secondary | ICD-10-CM | POA: Diagnosis not present

## 2023-04-03 DIAGNOSIS — D469 Myelodysplastic syndrome, unspecified: Secondary | ICD-10-CM | POA: Diagnosis not present

## 2023-04-03 DIAGNOSIS — G609 Hereditary and idiopathic neuropathy, unspecified: Secondary | ICD-10-CM | POA: Diagnosis not present

## 2023-04-03 DIAGNOSIS — D649 Anemia, unspecified: Secondary | ICD-10-CM | POA: Diagnosis not present

## 2023-04-03 DIAGNOSIS — Z0000 Encounter for general adult medical examination without abnormal findings: Secondary | ICD-10-CM | POA: Diagnosis not present

## 2023-04-03 DIAGNOSIS — I2510 Atherosclerotic heart disease of native coronary artery without angina pectoris: Secondary | ICD-10-CM | POA: Diagnosis not present

## 2023-04-03 DIAGNOSIS — E785 Hyperlipidemia, unspecified: Secondary | ICD-10-CM | POA: Diagnosis not present

## 2023-04-03 DIAGNOSIS — N1831 Chronic kidney disease, stage 3a: Secondary | ICD-10-CM | POA: Diagnosis not present

## 2023-04-03 DIAGNOSIS — K4090 Unilateral inguinal hernia, without obstruction or gangrene, not specified as recurrent: Secondary | ICD-10-CM | POA: Diagnosis not present

## 2023-04-03 DIAGNOSIS — I4811 Longstanding persistent atrial fibrillation: Secondary | ICD-10-CM | POA: Diagnosis not present

## 2023-04-14 ENCOUNTER — Encounter (HOSPITAL_BASED_OUTPATIENT_CLINIC_OR_DEPARTMENT_OTHER): Payer: Self-pay | Admitting: Cardiovascular Disease

## 2023-04-22 ENCOUNTER — Other Ambulatory Visit (HOSPITAL_BASED_OUTPATIENT_CLINIC_OR_DEPARTMENT_OTHER): Payer: Self-pay | Admitting: Family

## 2023-04-22 DIAGNOSIS — I4821 Permanent atrial fibrillation: Secondary | ICD-10-CM

## 2023-04-29 DIAGNOSIS — L570 Actinic keratosis: Secondary | ICD-10-CM | POA: Diagnosis not present

## 2023-04-29 DIAGNOSIS — Z85828 Personal history of other malignant neoplasm of skin: Secondary | ICD-10-CM | POA: Diagnosis not present

## 2023-04-29 DIAGNOSIS — L111 Transient acantholytic dermatosis [Grover]: Secondary | ICD-10-CM | POA: Diagnosis not present

## 2023-04-29 DIAGNOSIS — D2262 Melanocytic nevi of left upper limb, including shoulder: Secondary | ICD-10-CM | POA: Diagnosis not present

## 2023-05-23 ENCOUNTER — Other Ambulatory Visit (HOSPITAL_BASED_OUTPATIENT_CLINIC_OR_DEPARTMENT_OTHER): Payer: Self-pay | Admitting: Cardiovascular Disease

## 2023-05-23 DIAGNOSIS — I4821 Permanent atrial fibrillation: Secondary | ICD-10-CM

## 2023-05-25 NOTE — Telephone Encounter (Signed)
 Prescription refill request for Eliquis received. Indication:afib Last office visit:12/24 Scr:1.14  12/24 Age: 73 Weight:80.1  kg  Prescription refilled

## 2023-07-03 DIAGNOSIS — G629 Polyneuropathy, unspecified: Secondary | ICD-10-CM | POA: Diagnosis not present

## 2023-08-09 ENCOUNTER — Other Ambulatory Visit: Payer: Self-pay | Admitting: Hematology and Oncology

## 2023-08-09 DIAGNOSIS — D539 Nutritional anemia, unspecified: Secondary | ICD-10-CM

## 2023-08-09 DIAGNOSIS — C61 Malignant neoplasm of prostate: Secondary | ICD-10-CM

## 2023-08-09 NOTE — Progress Notes (Unsigned)
 [facility] [phone]   [phone]  PROGRESS NOTE  Patient Care Team: Aldo Hun, MD as PCP - General (Internal Medicine) Maudine Sos, MD as PCP - Cardiology (Cardiology) Daryel Ensign, DO as Consulting Physician (Neurology) Judyth Nunnery, RN Nurse Navigator as Registered Nurse (Medical Oncology)  Hematological/Oncological History # Macrocytic Anemia # Low Grade Myelodysplastic Syndrome  03/03/2011: WBC 4.1, Hgb 12.7, MCV 104.6, Plt 203 01/30/2017: WBC 12.1, Hgb 9.2, MCV 100.7, Plt 232 09/28/2019: WBC 24, Hgb 9.3, MCV 107.9, Plt 189 06/21/2020: WBC 10.8, Hgb 8.5, MCV 107.1, Plt 264 01/07/2021: establish care with Dr. Rosaline Coma     02/12/2021: Bmbx showed a hypercellular marrow with dyspoiesis and ring sideroblasts  Interval History:  Raymond Ray 73 y.o. male with medical history significant for low-grade myelodysplastic syndrome who presents for a follow up visit. The patient's last visit was on 02/05/2023. In the interim since the last visit he has had no major changes in his health.  On exam today Mr. Siegert notes he has been feeling good overall in the interim since our last visit.  He reports for his 50th anniversary he will be going on a small boat cruise in [place].  He reports his energy has been great better than a few years ago.  He reports that his appetite remains strong.  He is not having any overt signs of bleeding, bruising, or dark stools.  He reports he is off the digoxin  and metoprolol  due to bradycardia during her last clinic visit.  He is not having any runny nose, sore throat, or cough.  He reports that he has no other questions concerns or complaints today.  Full 10 point ROS is otherwise negative.  MEDICAL HISTORY:  Past Medical History:  Diagnosis Date   Allergy    Anemia    history of   Ascending aortic aneurysm (HCC) 05/29/2020   CAD in native artery 05/29/2020   35% LAD on cath.  LDL was 74 on 04/2018.  LDL goal  is less than 70.  It was 46 on 12/2019.  Continue rosuvastatin .   Carotid stenosis 05/29/2020   Chronic atrial fibrillation (HCC)    Dysrhythmia 1970s   Exertional dyspnea 04/11/2020   Heart murmur    History of colonoscopy 04/2001   negative   Hypertension    Mitral valve prolapse    Nonrheumatic mitral (valve) insufficiency    Osteoarthritis of hip    Permanent atrial fibrillation (HCC)    PONV (postoperative nausea and vomiting)    left knee cartilage removal;     Prostate cancer (HCC)    S/P minimally-invasive mitral valve repair 09/28/2019   Complex valvuloplasty including artificial Gore-tex neochord placement x6 with 36 mm Medtronic Sinuform annuloplasty ring via right mini thoracotomy approach   Tricuspid regurgitation    Vertigo 01/07/2017   currently not symptomatic     SURGICAL HISTORY: Past Surgical History:  Procedure Laterality Date   BUBBLE STUDY  08/24/2019   Procedure: BUBBLE STUDY;  Surgeon: Euell Herrlich, MD;  Location: [facility] ENDOSCOPY;  Service: Cardiology;;   CARDIAC CATHETERIZATION  09/28/2019   CLIPPING OF ATRIAL APPENDAGE N/A 09/28/2019   Procedure: CLIPPING OF ATRIAL APPENDAGE USING ATRICURE CLIP SIZE ;  Surgeon: Gardenia Jump, MD;  Location: [facility] OR;  Service: Open Heart Surgery;  Laterality: N/A;   COLONOSCOPY     KNEE SURGERY     left at age 59 in [place]   LYMPHADENECTOMY Bilateral 06/02/2019   Procedure:  LYMPHADENECTOMY, PELVIC;  Surgeon: Florencio Hunting, MD;  Location: [facility] ORS;  Service: Urology;  Laterality: Bilateral;   MINIMALLY INVASIVE TRICUSPID VALVE REPAIR Right 09/28/2019   Procedure: possible MINIMALLY INVASIVE TRICUSPID VALVE REPAIR;  Surgeon: Gardenia Jump, MD;  Location: [facility] OR;  Service: Open Heart Surgery;  Laterality: Right;   MITRAL VALVE REPAIR Right 09/28/2019   Procedure: MINIMALLY INVASIVE MITRAL VALVE REPAIR (MVR) USING SIMUFORM ;  Surgeon: Gardenia Jump, MD;  Location: [facility] OR;  Service: Open Heart Surgery;  Laterality:  Right;   PROSTATE BIOPSY     RIGHT/LEFT HEART CATH AND CORONARY ANGIOGRAPHY N/A 09/07/2019   Procedure: RIGHT/LEFT HEART CATH AND CORONARY ANGIOGRAPHY;  Surgeon: Arnoldo Lapping, MD;  Location: [facility] INVASIVE CV LAB;  Service: Cardiovascular;  Laterality: N/A;   ROBOT ASSISTED LAPAROSCOPIC RADICAL PROSTATECTOMY N/A 06/02/2019   Procedure: XI ROBOTIC ASSISTED LAPAROSCOPIC RADICAL PROSTATECTOMY LEVEL 2;  Surgeon: Florencio Hunting, MD;  Location: [facility] ORS;  Service: Urology;  Laterality: N/A;   spinal injection     December 2013   TEE WITHOUT CARDIOVERSION N/A 08/24/2019   Procedure: TRANSESOPHAGEAL ECHOCARDIOGRAM (TEE);  Surgeon: Euell Herrlich, MD;  Location: [facility] ENDOSCOPY;  Service: Cardiology;  Laterality: N/A;   TEE WITHOUT CARDIOVERSION N/A 09/28/2019   Procedure: TRANSESOPHAGEAL ECHOCARDIOGRAM (TEE);  Surgeon: Gardenia Jump, MD;  Location: [facility] OR;  Service: Open Heart Surgery;  Laterality: N/A;   TOTAL HIP ARTHROPLASTY Right 01/29/2017   Procedure: RIGHT TOTAL HIP ARTHROPLASTY ANTERIOR APPROACH;  Surgeon: Adonica Hoose, MD;  Location: [facility] ORS;  Service: Orthopedics;  Laterality: Right;   TOTAL HIP ARTHROPLASTY Left 06/20/2020   Procedure: TOTAL HIP ARTHROPLASTY ANTERIOR APPROACH;  Surgeon: Adonica Hoose, MD;  Location: [facility] ORS;  Service: Orthopedics;  Laterality: Left;   VASECTOMY      SOCIAL HISTORY: Social History   Socioeconomic History   Marital status: Married    Spouse name: Abe Abed   Number of children: 2   Years of education: Not on file   Highest education level: Not on file  Occupational History   Occupation: Retired  Tobacco Use   Smoking status: Never   Smokeless tobacco: Never  Vaping Use   Vaping status: Never Used  Substance and Sexual Activity   Alcohol  use: No   Drug use: No   Sexual activity: Yes  Other Topics Concern   Not on file  Social History Narrative   Right handed   Lives in two story home with wife   Daughter, Bridgette Campus, resides in [place]. She is a  homemaker and mother. Daughter, Sherrlyn Dolores, resides in [place]. 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    Social Drivers of Corporate investment banker Strain: Not on file  Food Insecurity: Not on file  Transportation Needs: Not on file  Physical Activity: Not on file  Stress: Not on file  Social Connections: Not on file  Intimate Partner Violence: Not on file    FAMILY HISTORY: Family History  Problem Relation Age of Onset   Arrhythmia Father        tachycardia   Cancer Mother    Heart attack Mother    Diabetes Mother    Breast cancer Sister    Breast cancer Paternal Aunt    Colon cancer Neg Hx    Esophageal cancer Neg Hx    Stomach cancer Neg Hx    Rectal cancer Neg Hx     ALLERGIES:  is allergic to amlodipine .  MEDICATIONS:  Current Outpatient Medications  Medication Sig Dispense  Refill   ELIQUIS  5 MG TABS tablet TAKE 1 TABLET BY MOUTH TWICE A DAY 180 tablet 1   fexofenadine (ALLEGRA) 180 MG tablet Take 180 mg by mouth daily as needed for allergies.     hydrocortisone cream 1 % Apply 1 application topically 2 (two) times daily as needed for itching.     isosorbide  mononitrate (IMDUR ) 30 MG 24 hr tablet TAKE 1/2 OF A TABLET (15 MG TOTAL) BY MOUTH DAILY 45 tablet 3   metoprolol  tartrate (LOPRESSOR ) 25 MG tablet TAKE HALF TABLET AS NEEDED FOR RESTING HEART MORE THAN 100BPM FOR 30 MINUTES. 90 tablet 1   Multiple Vitamins-Minerals (MULTIVITAMIN WITH MINERALS) tablet Take 1 tablet by mouth daily. [facility] men      polyethylene glycol (MIRALAX  / GLYCOLAX ) 17 g packet Take 17 g by mouth daily.     pregabalin (LYRICA) 50 MG capsule Take 50 mg by mouth 4 (four) times daily.     rosuvastatin  (CRESTOR ) 5 MG tablet Take 5 mg by mouth daily.     zolpidem  (AMBIEN ) 5 MG tablet Take 5 mg by mouth at bedtime.     No current facility-administered medications for this visit.    REVIEW OF SYSTEMS:   Constitutional: ( - ) fevers, ( - )  chills , ( - ) night sweats Eyes: ( - ) blurriness of vision, ( - )  double vision, ( - ) watery eyes Ears, nose, mouth, throat, and face: ( - ) mucositis, ( - ) sore throat Respiratory: ( - ) cough, ( - ) dyspnea, ( - ) wheezes Cardiovascular: ( - ) palpitation, ( - ) chest discomfort, ( - ) lower extremity swelling Gastrointestinal:  ( - ) nausea, ( - ) heartburn, ( - ) change in bowel habits Skin: ( - ) abnormal skin rashes Lymphatics: ( - ) new lymphadenopathy, ( - ) easy bruising Neurological: ( - ) numbness, ( - ) tingling, ( - ) new weaknesses Behavioral/Psych: ( - ) mood change, ( - ) new changes  All other systems were reviewed with the patient and are negative.  PHYSICAL EXAMINATION: Vitals:   08/10/23 1003  BP: (!) 111/59  Pulse: 84  Resp: 18  Temp: 98.3 F (36.8 C)  SpO2: 100%    Filed Weights   08/10/23 1003  Weight: 176 lb (79.8 kg)     GENERAL: Well-appearing elderly Caucasian male, alert, no distress and comfortable SKIN: skin color, texture, turgor are normal, no rashes or significant lesions EYES: conjunctiva are pink and non-injected, sclera clear LUNGS: clear to auscultation and percussion with normal breathing effort HEART: regular rate & rhythm and no murmurs and no lower extremity edema Musculoskeletal: no cyanosis of digits and no clubbing  PSYCH: alert & oriented x 3, fluent speech NEURO: no focal motor/sensory deficits  LABORATORY DATA:  I have reviewed the data as listed    Latest Ref Rng & Units 08/10/2023    8:56 AM 02/05/2023    9:32 AM 11/06/2022    9:15 AM  CBC  WBC 4.0 - 10.5 K/uL 4.0  4.8  5.5   Hemoglobin 13.0 - 17.0 g/dL 16.1  09.6  04.5   Hematocrit 39.0 - 52.0 % 32.1  33.4  35.3   Platelets 150 - 400 K/uL 322  298  300        Latest Ref Rng & Units 08/10/2023    8:56 AM 02/05/2023    9:32 AM 11/06/2022    9:15 AM  CMP  Glucose 70 - 99 mg/dL 96  409  811   BUN 8 - 23 mg/dL 22  21  18    Creatinine 0.61 - 1.24 mg/dL 9.14  7.82  9.56   Sodium 135 - 145 mmol/L 143  143  143   Potassium 3.5 - 5.1  mmol/L 4.4  4.4  4.3   Chloride 98 - 111 mmol/L 108  108  107   CO2 22 - 32 mmol/L 30  29  31    Calcium  8.9 - 10.3 mg/dL 9.3  9.3  9.8   Total Protein 6.5 - 8.1 g/dL 6.8  6.7  7.0   Total Bilirubin 0.0 - 1.2 mg/dL 1.9  1.6  2.0   Alkaline Phos 38 - 126 U/L 49  56  58   AST 15 - 41 U/L 16  14  14    ALT 0 - 44 U/L 11  10  11      Lab Results  Component Value Date   MPROTEIN Not Observed 01/07/2021   Lab Results  Component Value Date   KPAFRELGTCHN 27.7 (H) 01/07/2021   LAMBDASER 17.4 01/07/2021   KAPLAMBRATIO 1.59 01/07/2021   RADIOGRAPHIC STUDIES: No results found.  ASSESSMENT & PLAN Raymond Ray 73 y.o. male with medical history significant for low-grade myelodysplastic syndrome who presents for a follow up visit.   # Macrocytic Anemia # Low Grade Myelodysplastic Syndrome -- No evidence of nutritional deficiency or other possible causes of the patient's macrocytic anemia --Bone marrow biopsy was performed on 02/12/2021 and showed evidence of what appears to be a low-grade myelodysplastic syndrome with ring sideroblasts --Patient has had a low-grade anemia and macrocytosis for at least 5 years with evidence of this dating back 10 years.  Appears to be a stable process --Discussed with him that the treatment of choice would be erythropoietin shots if his hemoglobin were to consistently be less than 10.  No clear indication for treatment at this time. --labs today show white blood cell count 4.0, Hgb 11.2, MCV 101.3, platelets 322 --Recommend return to clinic in 6 months time to reevaluate  # Bradycardia--resolved -- Patient noted to have low heart rate on prior visits.  Discontinue metoprolol  and digoxin  -- HR 84 today, WNL   No orders of the defined types were placed in this encounter.   All questions were answered. The patient knows to call the clinic with any problems, questions or concerns.  A total of more than 25 minutes were spent on this encounter with  face-to-face time and non-face-to-face time, including preparing to see the patient, ordering tests and/or medications, counseling the patient and coordination of care as outlined above.   Rogerio Clay, MD Department of Hematology/Oncology [facility] Phone: [phone] Pager: [phone] Email: [email or web address]  08/11/2023 9:37 AM

## 2023-08-10 ENCOUNTER — Inpatient Hospital Stay (HOSPITAL_BASED_OUTPATIENT_CLINIC_OR_DEPARTMENT_OTHER): Payer: Medicare Other | Admitting: Hematology and Oncology

## 2023-08-10 ENCOUNTER — Inpatient Hospital Stay: Payer: Medicare Other | Attending: Hematology and Oncology

## 2023-08-10 ENCOUNTER — Encounter: Payer: Self-pay | Admitting: Hematology and Oncology

## 2023-08-10 VITALS — BP 111/59 | HR 84 | Temp 98.3°F | Resp 18 | Wt 176.0 lb

## 2023-08-10 DIAGNOSIS — D539 Nutritional anemia, unspecified: Secondary | ICD-10-CM

## 2023-08-10 DIAGNOSIS — R001 Bradycardia, unspecified: Secondary | ICD-10-CM

## 2023-08-10 DIAGNOSIS — Z803 Family history of malignant neoplasm of breast: Secondary | ICD-10-CM | POA: Insufficient documentation

## 2023-08-10 DIAGNOSIS — Z8546 Personal history of malignant neoplasm of prostate: Secondary | ICD-10-CM | POA: Diagnosis not present

## 2023-08-10 DIAGNOSIS — C61 Malignant neoplasm of prostate: Secondary | ICD-10-CM

## 2023-08-10 DIAGNOSIS — D4620 Refractory anemia with excess of blasts, unspecified: Secondary | ICD-10-CM | POA: Insufficient documentation

## 2023-08-10 LAB — CBC WITH DIFFERENTIAL (CANCER CENTER ONLY)
Abs Immature Granulocytes: 0.01 10*3/uL (ref 0.00–0.07)
Basophils Absolute: 0.1 10*3/uL (ref 0.0–0.1)
Basophils Relative: 1 %
Eosinophils Absolute: 0.2 10*3/uL (ref 0.0–0.5)
Eosinophils Relative: 4 %
HCT: 32.1 % — ABNORMAL LOW (ref 39.0–52.0)
Hemoglobin: 11.2 g/dL — ABNORMAL LOW (ref 13.0–17.0)
Immature Granulocytes: 0 %
Lymphocytes Relative: 32 %
Lymphs Abs: 1.3 10*3/uL (ref 0.7–4.0)
MCH: 35.3 pg — ABNORMAL HIGH (ref 26.0–34.0)
MCHC: 34.9 g/dL (ref 30.0–36.0)
MCV: 101.3 fL — ABNORMAL HIGH (ref 80.0–100.0)
Monocytes Absolute: 0.5 10*3/uL (ref 0.1–1.0)
Monocytes Relative: 12 %
Neutro Abs: 2 10*3/uL (ref 1.7–7.7)
Neutrophils Relative %: 51 %
Platelet Count: 322 10*3/uL (ref 150–400)
RBC: 3.17 MIL/uL — ABNORMAL LOW (ref 4.22–5.81)
RDW: 20.1 % — ABNORMAL HIGH (ref 11.5–15.5)
WBC Count: 4 10*3/uL (ref 4.0–10.5)
nRBC: 0 % (ref 0.0–0.2)

## 2023-08-10 LAB — CMP (CANCER CENTER ONLY)
ALT: 11 U/L (ref 0–44)
AST: 16 U/L (ref 15–41)
Albumin: 4.4 g/dL (ref 3.5–5.0)
Alkaline Phosphatase: 49 U/L (ref 38–126)
Anion gap: 5 (ref 5–15)
BUN: 22 mg/dL (ref 8–23)
CO2: 30 mmol/L (ref 22–32)
Calcium: 9.3 mg/dL (ref 8.9–10.3)
Chloride: 108 mmol/L (ref 98–111)
Creatinine: 1.12 mg/dL (ref 0.61–1.24)
GFR, Estimated: >60 mL/min (ref >60)
Glucose, Bld: 96 mg/dL (ref 70–99)
Potassium: 4.4 mmol/L (ref 3.5–5.1)
Sodium: 143 mmol/L (ref 135–145)
Total Bilirubin: 1.9 mg/dL — ABNORMAL HIGH (ref 0.0–1.2)
Total Protein: 6.8 g/dL (ref 6.5–8.1)

## 2023-08-11 LAB — PROSTATE-SPECIFIC AG, SERUM (LABCORP): Prostate Specific Ag, Serum: <0.1 ng/mL (ref 0.0–4.0)

## 2023-08-20 ENCOUNTER — Ambulatory Visit (INDEPENDENT_AMBULATORY_CARE_PROVIDER_SITE_OTHER): Payer: Medicare Other | Admitting: Cardiovascular Disease

## 2023-08-20 ENCOUNTER — Encounter (HOSPITAL_BASED_OUTPATIENT_CLINIC_OR_DEPARTMENT_OTHER): Payer: Self-pay | Admitting: Cardiovascular Disease

## 2023-08-20 VITALS — BP 90/50 | HR 78 | Ht 75.0 in | Wt 175.0 lb

## 2023-08-20 DIAGNOSIS — I6529 Occlusion and stenosis of unspecified carotid artery: Secondary | ICD-10-CM | POA: Diagnosis not present

## 2023-08-20 DIAGNOSIS — I2510 Atherosclerotic heart disease of native coronary artery without angina pectoris: Secondary | ICD-10-CM | POA: Diagnosis not present

## 2023-08-20 DIAGNOSIS — I7121 Aneurysm of the ascending aorta, without rupture: Secondary | ICD-10-CM | POA: Diagnosis not present

## 2023-08-20 DIAGNOSIS — I361 Nonrheumatic tricuspid (valve) insufficiency: Secondary | ICD-10-CM | POA: Diagnosis not present

## 2023-08-20 DIAGNOSIS — I4821 Permanent atrial fibrillation: Secondary | ICD-10-CM | POA: Diagnosis not present

## 2023-08-20 DIAGNOSIS — I340 Nonrheumatic mitral (valve) insufficiency: Secondary | ICD-10-CM

## 2023-08-20 NOTE — Patient Instructions (Addendum)
 Medication Instructions:  Your physician recommends that you continue on your current medications as directed. Please refer to the Current Medication list given to you today.  *If you need a refill on your cardiac medications before your next appointment, please call your pharmacy*  Lab Work: NONE  Testing/Procedures: NONE  Follow-Up: At [facility], you and your health needs are our priority.  As part of our continuing mission to provide you with exceptional heart care, we have created designated Provider Care Teams.  These Care Teams include your primary Cardiologist (physician) and Advanced Practice Providers (APPs -  Physician Assistants and Nurse Practitioners) who all work together to provide you with the care you need, when you need it.  We recommend signing up for the patient portal called MyChart.  Sign up information is provided on this After Visit Summary.  MyChart is used to connect with patients for Virtual Visits (Telemedicine).  Patients are able to view lab/test results, encounter notes, upcoming appointments, etc.  Non-urgent messages can be sent to your provider as well.   To learn more about what you can do with MyChart, go to [email or web address].    Your next appointment:   6 month(s)  The format for your next appointment:   In Person  Provider:   DR Nino Bass NP, OR Charlton Cooler NP

## 2023-08-20 NOTE — Progress Notes (Signed)
 Cardiology Office Note:  .   Date:  08/20/2023  ID:  Raymond Ray, DOB [birth year], MRN [id number] PCP: Shayne Anes, MD  [facility] Providers Cardiologist:  Annabella Scarce, MD    History of Present Illness: .    Raymond Ray is a 73 y.o. male with chronic atrial fibrillation (s/p LAA clipping) and mitral regurgitation 2/2 MVP s/p mitral valve repair, low grade myelodysplastic syndrome, mild carotid stenosis, medically managed CAD, who presents for follow up.  Raymond Ray was previously a patient of Dr. Dominick.  He first developed atrial fibrillation in 1986.  He was asymptomatic and it was discovered on physical exam.  He was initially treated with amiodarone and this was discontinued due to concern for long-term toxicity.  He was then on quinidine and this was transitioned to digoxin  when he moved from [place] to the [place] . Raymond Ray had an echo 10/09/09 that showed normal systolic function and posterior mitral valve prolapse with moderate mitral regurgitation and normal PA pressures.  He had a nuclear stress 04/29/02 showing no ischemia and an ejection fraction of 55%.  He developed epistaxis on Xarelto  and switched back to Coumadin  with an INR goal of 1.8-2.2.  However,  He switch to Eliquis  with no overt bleeding.  He was noted to have a low iron levels and anemia.  He was started on iron supplementation and has continued on Eliquis .  He had a colonoscopy 08/2018 that revealed diverticuli and a 3 mm polyp that was removed.  At his last appointment he reported some mild exertional dyspnea.  He had a repeat echo 07/2019 that revealed LVEF 50 to 55%.  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  He underwent minimally invasive mitral valve repair with Dr. Dusty on 09/2019.  He had a 36 mm Medtronic Sinuform Ring and the left atrial appendage was clipped.  He was switched back to warfarin for valvular atrial fibrillation.   Raymond Ray had fast heart  rate with exercise. He wore a monitor 03/28/20 that showed his heart rate increase to 201 with up to 4 second pauses at night. He had an ETT on 04/27/20 with poorly control heart rate with 1 mm ST depression.  Metoprolol  was reduced and digoxin  was restarted.  He underwent hip replacement on 05/2020.  He underwent bone marrow biopsy 01/2021 for longstanding microcytic anemia.  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  Given that he had been stable for >10 years they recommended continued observation. He had an Echo 03/2021 that revealed LVEF 50-55%. 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 He followed up with Raymond Finder, NP, 08/2021 after wearing a monitor that showed 100% atrial fibrillation with an average heart rate of 90 bpm. The digoxin  was increased. His monitor did show up to 10 beats of NSVT versus atrial fibrillation with aberrancy. He had a PET scan 11/2021 that showed a small area of ischemia in the mid infra-lateral region. There was severe coronary calcification in the left circumflex, LAD, and RCA region. He was started on imdur .    In 12/2021 he was not feeling differently since Imdur  was started. He reported LE edema attributable to amlodipine . Sildenafil  and amlodipine  were discontinued. After discussion of medical management vs. proceeding with cardiac catheterization he preferred to continue with medical management and increase his exercise.    At his 03/2022 visit, he felt closer to his baseline. His blood pressure was 90/58  initially and 86/52 on recheck in the office. He wasn't monitoring at home. Occasional fatigue in the afternoons but no lightheadedness. He had an echo 04/2022 revealing LVEF 55%, indeterminate diastolic parameters, right ventricular systolic pressure of 43.2 mmHg, trivial mitral and aortic valve regurgitation, moderate tricuspid valve regurgitation. There was borderline dilatation of the aortic root measuring 38 mm. He saw  oncology 07/2022 who recommended erythropoietin shots for hemoglobin less than 10.  At his visit 09/2022 he was feeling well.  He was exercising without ischemic symptoms.  He saw his PCP 01/2023 and heart rate was in the 30s.  Digoxin  was discontinued and metoprolol  was held.  He followed up with Raymond Finder, NP later that month and heart rates were stable in the 60s to 70s.  He was given metoprolol  tartrate to take as needed.   Discussed the use of AI scribe software for clinical note transcription with the patient, who gave verbal consent to proceed.  History of Present Illness Raymond Ray feels generally well and enjoys spending time with his grandchildren. He notes occasional low blood pressure in the morning but has not experienced significant lightheadedness or dizziness, although he felt slightly lightheaded this morning. He maintains adequate fluid intake, especially when outdoors.  His heart rate has been stable since discontinuing digoxin  and metoprolol  due to low heart rates. He has not needed to use metoprolol  as needed for high heart rates. He continues to exercise regularly, including sessions with a trainer twice a week and using the elliptical. He experiences some limitations due to foot discomfort but reports no issues with breathing during exercise.  He experiences occasional swelling in his legs, particularly at night, which improves by morning. The swelling is influenced by the type of footwear he uses, with flip flops causing more swelling.  He is currently on Eliquis  for atrial fibrillation, rosuvastatin  for cholesterol, and isosorbide  for heart artery blockage. He takes 5 mg of rosuvastatin , which has effectively managed his cholesterol levels. He occasionally adjusts the timing of his Eliquis  dose but maintains a consistent routine.  His past medical history includes atrial fibrillation, valve repair, and enlarged atria, which have been stable. He recalls that his  echocardiogram last year was normal and that his valve was functioning well.  ROS:  As per HPI  Studies Reviewed: .       Echo 04/2021:  1. Left ventricular ejection fraction, by estimation, is 55%. The left  ventricle has normal function. The left ventricle has no regional wall  motion abnormalities. Left ventricular diastolic parameters are  indeterminate.   2. Right ventricular systolic function is mildly reduced. The right  ventricular size is mildly enlarged. There is mildly elevated pulmonary  artery systolic pressure. The estimated right ventricular systolic  pressure is 43.2 mmHg.   3. Left atrial size was severely dilated.   4. Right atrial size was severely dilated.   5. The mitral valve has been repaired/replaced. Trivial mitral valve  regurgitation. No evidence of mitral stenosis. The mean mitral valve  gradient is 4.0 mmHg at a HR 47bpm. There is a 36 mm Medtronic Sinuform  annuloplasty ring present in the mitral  position.   6. Tricuspid valve regurgitation is moderate.   7. The aortic valve is tricuspid. Aortic valve regurgitation is trivial.  Aortic valve sclerosis is present, with no evidence of aortic valve  stenosis.   8. Aortic dilatation noted. There is borderline dilatation of the aortic  root, measuring 38 mm.   9. The inferior  vena cava is normal in size with greater than 50%  respiratory variability, suggesting right atrial pressure of 3 mmHg.   Risk Assessment/Calculations:    CHA2DS2-VASc Score = 2   This indicates a 2.2% annual risk of stroke. The patient's score is based upon: CHF History: 0 HTN History: 0 Diabetes History: 0 Stroke History: 0 Vascular Disease History: 1 Age Score: 1 Gender Score: 0            Physical Exam:   VS:  BP (!) 90/50   Pulse 78   Ht 6' 3 (1.905 m)   Wt 175 lb (79.4 kg)   SpO2 96%   BMI 21.87 kg/m  , BMI Body mass index is 21.87 kg/m. GENERAL:  Well appearing HEENT: Pupils equal round and reactive, fundi  not visualized, oral mucosa unremarkable NECK:  No jugular venous distention, waveform within normal limits, carotid upstroke brisk and symmetric, no bruits, no thyromegaly LUNGS:  Clear to auscultation bilaterally HEART:  Irregularly irregular.  PMI not displaced or sustained,S1 and S2 within normal limits, no S3, no S4, no clicks, no rubs, no murmurs ABD:  Flat, positive bowel sounds normal in frequency in pitch, no bruits, no rebound, no guarding, no midline pulsatile mass, no hepatomegaly, no splenomegaly EXT:  2 plus pulses throughout, no edema, no cyanosis no clubbing.  +Varicose veins SKIN:  No rashes no nodules NEURO:  Cranial nerves II through XII grossly intact, motor grossly intact throughout PSYCH:  Cognitively intact, oriented to person place and time   ASSESSMENT AND PLAN: .    Assessment & Plan # Permanent Atrial Fibrillation Chronic, asymptomatic atrial fibrillation. On Eliquis  for stroke prevention.  He is no longer on digoxin  or metoprolol  due to bradycardia.  He has recurrent epistaxis, worse in the Winter.  We discussed the Watchman device.  Given that his appendage was clipped, he is not a candidate.  Continue Eliquis .  He has metoprolol  to take as needed for tachycardia but has not needed it.  # Mitral Valve Regurgitation # s/p Mitral Valve Repair. Previously treated with clip procedure. No current heart failure symptoms.  #Hypotension Occasional morning hypotension without significant symptoms. - Advise adequate hydration, especially during outdoor activities.  # CAD: Medically managed.  He exercises regularly and is asymptomatic.  Lipids are well-controlled.  Continue rosuvastatin  and Imdur .  He is on Eliquis .  # Peripheral Edema Mild, nocturnal, footwear-influenced edema resolving by morning.  # Hyperlipidemia Well-controlled on rosuvastatin  with excellent LDL levels. - Continue rosuvastatin  5 mg daily.  Dispo: f/u 6 months  Signed, Annabella Scarce, MD

## 2023-08-27 ENCOUNTER — Telehealth: Payer: Self-pay | Admitting: Hematology and Oncology

## 2023-08-27 NOTE — Telephone Encounter (Signed)
 Scheduled appointments per 6/18 los. Called and left a VM with appointment details for the patient.

## 2023-10-29 DIAGNOSIS — H5213 Myopia, bilateral: Secondary | ICD-10-CM | POA: Diagnosis not present

## 2023-10-29 DIAGNOSIS — H2513 Age-related nuclear cataract, bilateral: Secondary | ICD-10-CM | POA: Diagnosis not present

## 2023-10-29 DIAGNOSIS — H02831 Dermatochalasis of right upper eyelid: Secondary | ICD-10-CM | POA: Diagnosis not present

## 2023-10-29 DIAGNOSIS — H02834 Dermatochalasis of left upper eyelid: Secondary | ICD-10-CM | POA: Diagnosis not present

## 2023-11-06 DIAGNOSIS — C61 Malignant neoplasm of prostate: Secondary | ICD-10-CM | POA: Diagnosis not present

## 2023-11-15 ENCOUNTER — Other Ambulatory Visit (HOSPITAL_BASED_OUTPATIENT_CLINIC_OR_DEPARTMENT_OTHER): Payer: Self-pay | Admitting: Family

## 2023-11-25 DIAGNOSIS — D6869 Other thrombophilia: Secondary | ICD-10-CM | POA: Diagnosis not present

## 2023-11-25 DIAGNOSIS — I4811 Longstanding persistent atrial fibrillation: Secondary | ICD-10-CM | POA: Diagnosis not present

## 2023-11-25 DIAGNOSIS — K4090 Unilateral inguinal hernia, without obstruction or gangrene, not specified as recurrent: Secondary | ICD-10-CM | POA: Diagnosis not present

## 2023-11-25 DIAGNOSIS — J329 Chronic sinusitis, unspecified: Secondary | ICD-10-CM | POA: Diagnosis not present

## 2023-11-25 DIAGNOSIS — D469 Myelodysplastic syndrome, unspecified: Secondary | ICD-10-CM | POA: Diagnosis not present

## 2023-11-25 DIAGNOSIS — I2510 Atherosclerotic heart disease of native coronary artery without angina pectoris: Secondary | ICD-10-CM | POA: Diagnosis not present

## 2023-11-25 DIAGNOSIS — N5201 Erectile dysfunction due to arterial insufficiency: Secondary | ICD-10-CM | POA: Diagnosis not present

## 2023-11-25 DIAGNOSIS — C61 Malignant neoplasm of prostate: Secondary | ICD-10-CM | POA: Diagnosis not present

## 2023-11-25 DIAGNOSIS — U071 COVID-19: Secondary | ICD-10-CM | POA: Diagnosis not present

## 2023-11-25 DIAGNOSIS — N1831 Chronic kidney disease, stage 3a: Secondary | ICD-10-CM | POA: Diagnosis not present

## 2023-11-25 DIAGNOSIS — E785 Hyperlipidemia, unspecified: Secondary | ICD-10-CM | POA: Diagnosis not present

## 2023-11-25 DIAGNOSIS — G4700 Insomnia, unspecified: Secondary | ICD-10-CM | POA: Diagnosis not present

## 2023-11-25 DIAGNOSIS — I341 Nonrheumatic mitral (valve) prolapse: Secondary | ICD-10-CM | POA: Diagnosis not present

## 2023-12-09 DIAGNOSIS — M79645 Pain in left finger(s): Secondary | ICD-10-CM | POA: Diagnosis not present

## 2023-12-09 DIAGNOSIS — M19042 Primary osteoarthritis, left hand: Secondary | ICD-10-CM | POA: Diagnosis not present

## 2023-12-09 DIAGNOSIS — L821 Other seborrheic keratosis: Secondary | ICD-10-CM | POA: Diagnosis not present

## 2023-12-09 DIAGNOSIS — M79644 Pain in right finger(s): Secondary | ICD-10-CM | POA: Diagnosis not present

## 2023-12-09 DIAGNOSIS — Z85828 Personal history of other malignant neoplasm of skin: Secondary | ICD-10-CM | POA: Diagnosis not present

## 2023-12-10 ENCOUNTER — Other Ambulatory Visit (HOSPITAL_BASED_OUTPATIENT_CLINIC_OR_DEPARTMENT_OTHER): Payer: Self-pay | Admitting: Cardiovascular Disease

## 2023-12-10 DIAGNOSIS — I4821 Permanent atrial fibrillation: Secondary | ICD-10-CM

## 2023-12-10 NOTE — Telephone Encounter (Signed)
 Prescription refill request for Eliquis  received. Indication: AFIB  Last office visit:08/20/2023, [place]  Scr: 1.12, 08/10/2023 Age: 73 yo  Weight: 79.4 kg   Refill sent.

## 2023-12-14 DIAGNOSIS — Z23 Encounter for immunization: Secondary | ICD-10-CM | POA: Diagnosis not present

## 2023-12-28 ENCOUNTER — Telehealth (HOSPITAL_BASED_OUTPATIENT_CLINIC_OR_DEPARTMENT_OTHER): Payer: Self-pay

## 2023-12-28 ENCOUNTER — Ambulatory Visit: Payer: Self-pay | Admitting: Surgery

## 2023-12-28 DIAGNOSIS — K4090 Unilateral inguinal hernia, without obstruction or gangrene, not specified as recurrent: Secondary | ICD-10-CM | POA: Diagnosis not present

## 2023-12-28 NOTE — Telephone Encounter (Signed)
   Pre-operative Risk Assessment    Patient Name: Jaysin Gayler  DOB: 1950/09/21 MRN: [id number]   Date of last office visit: 08/20/23 With Dr. Raford Date of next office visit: 02/01/24 with Dr. Raford   Request for Surgical Clearance    Procedure:  Hernia Surgery  Date of Surgery:  Clearance TBD                                 Surgeon:  Dr. Debby Shipper Surgeon's Group or Practice Name:  [facility] Phone number:  [phone] Fax number:  [phone]   Type of Clearance Requested:   - Medical  - Pharmacy:  Hold Apixaban  (Eliquis ) for 2 days prior   Type of Anesthesia:  General    Additional requests/questions:    Bonney Augustin JONETTA Delores   12/28/2023, 10:36 AM

## 2024-01-05 NOTE — Telephone Encounter (Signed)
 Patient with diagnosis of atrial fibrillation on Eliquis  for anticoagulation.    Procedure:  Hernia Surgery   Date of Surgery:  Clearance TBD    CHA2DS2-VASc Score = 3   This indicates a 3.2% annual risk of stroke. The patient's score is based upon: CHF History: 0 HTN History: 1 Diabetes History: 0 Stroke History: 0 Vascular Disease History: 1 Age Score: 1 Gender Score: 0    CrCl 67 Platelet count 322  Patient has not had an Afib/aflutter ablation in the last 3 months, DCCV within the last 4 weeks or a watchman implanted in the last 45 days   Per office protocol, patient can hold Eliquis  for 2 days prior to procedure.   Patient will not need bridging with Lovenox  (enoxaparin ) around procedure.  **This guidance is not considered finalized until pre-operative APP has relayed final recommendations.**

## 2024-01-05 NOTE — Telephone Encounter (Signed)
 S/w the pt and he tells me he has a consult with another surgeon office as well to discuss hernia surgery. Pt will be sure if he goes with the other surgeon to make sure they fax over a clearance request as well.    I did confirm with the pt no matter which surgeon he goes with as he plans this will not be done until 02/2024, does he want Dr. Raford to review for preop clearance. Pt said yes please.

## 2024-01-05 NOTE — Telephone Encounter (Signed)
   Name: Raymond Ray  DOB: May 18, 1950  MRN: [id number]  Primary Cardiologist: Annabella Scarce, MD   Preoperative team, please contact this patient and set up a phone call appointment for further preoperative risk assessment. Please obtain consent and complete medication review. Thank you for your help.  Patient has a visit with Dr. Scarce on 12/8. Patient can either have a tele visit and keep visit with Dr. Scarce or have Dr. Scarce address pre-op at the time of his visit.   I confirm that guidance regarding antiplatelet and oral anticoagulation therapy has been completed and, if necessary, noted below.  Per Pharm D, patient has not had an Afib/aflutter ablation within the last 3 months, DCCV within the last 4 weeks, or Watchman in the last 45 days. Patient may hold Eliquis  for 2 days prior to procedure. Patient will not need bridging with Lovenox  around procedure.    I also confirmed the patient resides in the state of [place] . As per [facility] Medical Board telemedicine laws, the patient must reside in the state in which the provider is licensed.    Barnie Hila, NP 01/05/2024, 4:47 PM [facility]

## 2024-01-15 ENCOUNTER — Ambulatory Visit: Payer: Self-pay | Admitting: General Surgery

## 2024-01-15 DIAGNOSIS — K4020 Bilateral inguinal hernia, without obstruction or gangrene, not specified as recurrent: Secondary | ICD-10-CM | POA: Diagnosis not present

## 2024-01-15 NOTE — Telephone Encounter (Signed)
 Received another clearance from [facility] but with a different surgeon for his hernia repair surgery.       Pre-operative Risk Assessment    Patient Name: Raymond Ray  DOB: 05-14-1950 MRN: [id number]   Date of last office visit: 08/20/2023 with Dr. Raford Date of next office visit: 02/01/2024 with Dr. Raford   Request for Surgical Clearance    Procedure:  Hernia repair surgery  Date of Surgery:  Clearance TBD                                 Surgeon:  Lynda Leos, MD Surgeon's Group or Practice Name:  [facility]  Phone number:  [phone] Fax number:  [phone] or [phone] - Rosaline Sprang, CMA   Type of Clearance Requested:   - Medical  - Pharmacy:  Hold Apixaban  (Eliquis ) -Does not specify   Type of Anesthesia:  General    Additional requests/questions:  None  SignedPatrcia Iverson CROME   01/15/2024, 11:51 AM

## 2024-01-15 NOTE — H&P (Signed)
 Chief Complaint: Wound Check     History of Present Illness: Raymond Ray is a 73 y.o. male who is seen today as an office consultation at the request of Dr. Arch for evaluation of Wound Check .   History of Present Illness Raymond Ray is a 73 year old male who presents for surgical evaluation of a hernia. He was referred by his family doctor for surgical evaluation of his hernia.  He has had a hernia for a few months without pain. The hernia varies in size and is reducible manually. There is no increase in size over time. He experiences no generalized abdominal pain, nausea, or vomiting. He is currently taking Eliquis  for atrial fibrillation.     Review of Systems: A complete review of systems was obtained from the patient.  I have reviewed this information and discussed as appropriate with the patient.  See HPI as well for other ROS.  Review of Systems  Constitutional:  Negative for fever.  HENT:  Negative for congestion.   Eyes:  Negative for blurred vision.  Respiratory:  Negative for cough, shortness of breath and wheezing.   Cardiovascular:  Negative for chest pain and palpitations.  Gastrointestinal:  Negative for heartburn.  Genitourinary:  Negative for dysuria.  Musculoskeletal:  Negative for myalgias.  Skin:  Negative for rash.  Neurological:  Negative for dizziness and headaches.  Psychiatric/Behavioral:  Negative for depression and suicidal ideas.   All other systems reviewed and are negative.     Medical History: Past Medical History: Diagnosis Date  Heart valve disease   History of cancer    There is no problem list on file for this patient.   Past Surgical History: Procedure Laterality Date  JOINT REPLACEMENT    PROSTATE SURGERY    VASCULAR SURGERY      No Known Allergies  Current Outpatient Medications on File Prior to Visit Medication Sig Dispense Refill  ELIQUIS  5 mg tablet Take 5 mg by mouth 2 (two) times daily    isosorbide  mononitrate  (IMDUR ) 30 MG ER tablet TAKE 1/2 OF A TABLET (15 MG TOTAL) BY MOUTH DAILY    multivitamin with minerals tablet Take 1 tablet by mouth    pregabalin (LYRICA) 50 MG capsule TAKE 6 CAPSULES (300 MG TOTAL) BY MOUTH DAILY.    rosuvastatin  (CRESTOR ) 5 MG tablet 1 TABLET ORALLY ONCE A DAY 30 DAY(S)    zolpidem  (AMBIEN ) 10 mg tablet TAKE 1 TABLET BY MOUTH EVERY DAY AT BEDTIME AS NEEDED FOR 30 DAYS    No current facility-administered medications on file prior to visit.   Family History Problem Relation Age of Onset  Heart valve disease Mother   Diabetes Mother   Breast cancer Sister     Social History  Tobacco Use Smoking Status Never Smokeless Tobacco Never    Social History  Socioeconomic History  Marital status: Married Tobacco Use  Smoking status: Never  Smokeless tobacco: Never Vaping Use  Vaping status: Never Used Substance and Sexual Activity  Alcohol  use: Never  Drug use: Never  Social Drivers of Health  Housing Stability: Unknown (12/28/2023)  Housing Stability Vital Sign   Homeless in the Last Year: No   Objective:   There were no vitals filed for this visit.  There is no height or weight on file to calculate BMI. Physical Exam Constitutional:      Appearance: Normal appearance.  HENT:     Head: Normocephalic and atraumatic.     Nose: Nose normal. No congestion.  Mouth/Throat:     Mouth: Mucous membranes are moist.     Pharynx: Oropharynx is clear.  Eyes:     Pupils: Pupils are equal, round, and reactive to light.  Cardiovascular:     Rate and Rhythm: Normal rate and regular rhythm.     Pulses: Normal pulses.     Heart sounds: Normal heart sounds. No murmur heard.    No friction rub. No gallop.  Pulmonary:     Effort: Pulmonary effort is normal. No respiratory distress.     Breath sounds: Normal breath sounds. No stridor. No wheezing, rhonchi or rales.  Abdominal:     General: Abdomen is flat.     Hernia: A hernia (L > R) is present. Hernia is  present in the left inguinal area and right inguinal area.  Musculoskeletal:        General: Normal range of motion.     Cervical back: Normal range of motion.  Skin:    General: Skin is warm and dry.  Neurological:     General: No focal deficit present.     Mental Status: He is alert and oriented to person, place, and time.  Psychiatric:        Mood and Affect: Mood normal.        Thought Content: Thought content normal.       Assessment and Plan: There are no diagnoses linked to this encounter.  Raymond Ray is a 73 y.o. male   1.  We will proceed to the OR for a LAP BILATERAL(L>R) inguinal hernia repair with mesh. 2. All risks and benefits were discussed with the patient, to generally include infection, bleeding, damage to surrounding structures, acute and chronic nerve pain, and recurrence. Alternatives were offered and described.  All questions were answered and the patient voiced understanding of the procedure and wishes to proceed at this point.       No follow-ups on file.  Lynda Leos, MD, [facility], [place] [facility]

## 2024-01-19 NOTE — Telephone Encounter (Signed)
   Name: Raymond Ray  DOB: 06-09-1950  MRN: [id number]  Primary Cardiologist: Annabella Scarce, MD  Chart reviewed as part of pre-operative protocol coverage. The patient has an upcoming visit scheduled with Dr. Scarce on 02/01/24 at which time clearance can be addressed in case there are any issues that would impact surgical recommendations.  Hernia repair is not scheduled as below. I added preop FYI to appointment note so that provider is aware to address at time of outpatient visit.  Per office protocol the cardiology provider should forward their finalized clearance decision and recommendations regarding antiplatelet therapy to the requesting party below.    Patient has not had an Afib/aflutter ablation in the last 3 months, DCCV within the last 4 weeks or a watchman implanted in the last 45 days    Per office protocol, patient can hold Eliquis  for 2 days prior to procedure.    I will route this message as FYI to requesting party and remove this message from the preop box as separate preop APP input not needed at this time.   Please call with any questions.  Masen Luallen D Vincy Feliz, NP  01/19/2024, 8:35 AM

## 2024-01-19 NOTE — Telephone Encounter (Signed)
 Patient with diagnosis of afib on Eliquis  for anticoagulation.    Procedure:  Hernia repair surgery  Date of procedure: TBD   CHA2DS2-VASc Score = 3   This indicates a 3.2% annual risk of stroke. The patient's score is based upon: CHF History: 0 HTN History: 1 Diabetes History: 0 Stroke History: 0 Vascular Disease History: 1 Age Score: 1 Gender Score: 0      CrCl 66.9 ml/min Platelet count 322  Patient has not had an Afib/aflutter ablation in the last 3 months, DCCV within the last 4 weeks or a watchman implanted in the last 45 days   Per office protocol, patient can hold Eliquis  for 2 days prior to procedure.    **This guidance is not considered finalized until pre-operative APP has relayed final recommendations.**

## 2024-02-01 ENCOUNTER — Encounter (HOSPITAL_BASED_OUTPATIENT_CLINIC_OR_DEPARTMENT_OTHER): Payer: Self-pay | Admitting: *Deleted

## 2024-02-01 ENCOUNTER — Encounter (HOSPITAL_BASED_OUTPATIENT_CLINIC_OR_DEPARTMENT_OTHER): Payer: Self-pay | Admitting: Cardiovascular Disease

## 2024-02-01 ENCOUNTER — Ambulatory Visit (INDEPENDENT_AMBULATORY_CARE_PROVIDER_SITE_OTHER): Admitting: Cardiovascular Disease

## 2024-02-01 VITALS — BP 98/58 | HR 82 | Ht 75.0 in | Wt 179.7 lb

## 2024-02-01 DIAGNOSIS — I7121 Aneurysm of the ascending aorta, without rupture: Secondary | ICD-10-CM

## 2024-02-01 DIAGNOSIS — I340 Nonrheumatic mitral (valve) insufficiency: Secondary | ICD-10-CM

## 2024-02-01 DIAGNOSIS — I6529 Occlusion and stenosis of unspecified carotid artery: Secondary | ICD-10-CM | POA: Diagnosis not present

## 2024-02-01 DIAGNOSIS — I2510 Atherosclerotic heart disease of native coronary artery without angina pectoris: Secondary | ICD-10-CM | POA: Diagnosis not present

## 2024-02-01 DIAGNOSIS — I4821 Permanent atrial fibrillation: Secondary | ICD-10-CM

## 2024-02-01 NOTE — Progress Notes (Signed)
 Cardiology Office Note:  .   Date:  02/01/2024  ID:  Raymond Ray, DOB [birth year], MRN [id number] PCP: Shayne Anes, MD  [facility] Providers Cardiologist:  Annabella Scarce, MD    History of Present Illness: .    Raymond Ray is a 73 y.o. male with chronic atrial fibrillation (s/p LAA clipping) and mitral regurgitation 2/2 MVP s/p mitral valve repair, low grade myelodysplastic syndrome, mild carotid stenosis, medically managed CAD, who presents for follow up.  Raymond Ray was previously a patient of Dr. Dominick.  He first developed atrial fibrillation in 1986.  He was asymptomatic and it was discovered on physical exam.  He was initially treated with amiodarone and this was discontinued due to concern for long-term toxicity.  He was then on quinidine and this was transitioned to digoxin  when he moved from [place] to the [place] . Raymond Ray had an echo 10/09/09 that showed normal systolic function and posterior mitral valve prolapse with moderate mitral regurgitation and normal PA pressures.  He had a nuclear stress 04/29/02 showing no ischemia and an ejection fraction of 55%.  He developed epistaxis on Xarelto  and switched back to Coumadin  with an INR goal of 1.8-2.2.  However,  He switch to Eliquis  with no overt bleeding.  He was noted to have a low iron levels and anemia.  He was started on iron supplementation and has continued on Eliquis .  He had a colonoscopy 08/2018 that revealed diverticuli and a 3 mm polyp that was removed.  At his last appointment he reported some mild exertional dyspnea.  He had a repeat echo 07/2019 that revealed LVEF 50 to 55%.  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  He underwent minimally invasive mitral valve repair with Dr. Dusty on 09/2019.  He had a 36 mm Medtronic Sinuform Ring and the left atrial appendage was clipped.  He was switched back to warfarin for valvular atrial fibrillation.   Raymond Ray had fast heart  rate with exercise. He wore a monitor 03/28/20 that showed his heart rate increase to 201 with up to 4 second pauses at night. He had an ETT on 04/27/20 with poorly control heart rate with 1 mm ST depression.  Metoprolol  was reduced and digoxin  was restarted.  He underwent hip replacement on 05/2020.  He underwent bone marrow biopsy 01/2021 for longstanding microcytic anemia.  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  Given that he had been stable for >10 years they recommended continued observation. He had an Echo 03/2021 that revealed LVEF 50-55%. 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 He followed up with Raymond Finder, NP, 08/2021 after wearing a monitor that showed 100% atrial fibrillation with an average heart rate of 90 bpm. The digoxin  was increased. His monitor did show up to 10 beats of NSVT versus atrial fibrillation with aberrancy. He had a PET scan 11/2021 that showed a small area of ischemia in the mid infra-lateral region. There was severe coronary calcification in the left circumflex, LAD, and RCA region. He was started on imdur .    In 12/2021 he was not feeling differently since Imdur  was started. He reported LE edema attributable to amlodipine . Sildenafil  and amlodipine  were discontinued. After discussion of medical management vs. proceeding with cardiac catheterization he preferred to continue with medical management and increase his exercise.    At his 03/2022 visit, he felt closer to his baseline. His blood pressure was 90/58  initially and 86/52 on recheck in the office. He wasn't monitoring at home. Occasional fatigue in the afternoons but no lightheadedness. He had an echo 04/2022 revealing LVEF 55%, indeterminate diastolic parameters, right ventricular systolic pressure of 43.2 mmHg, trivial mitral and aortic valve regurgitation, moderate tricuspid valve regurgitation. There was borderline dilatation of the aortic root measuring 38 mm. He saw  oncology 07/2022 who recommended erythropoietin shots for hemoglobin less than 10.  At his visit 09/2022 he was feeling well.  He was exercising without ischemic symptoms.  He saw his PCP 01/2023 and heart rate was in the 30s.  Digoxin  was discontinued and metoprolol  was held.  He followed up with Raymond Finder, NP later that month and heart rates were stable in the 60s to 70s.  He was given metoprolol  tartrate to take as needed.  At his visit 07/2023 he was doing well.  His heart rate was stable off metoprolol  and digoxin .  Discussed the use of AI scribe software for clinical note transcription with the patient, who gave verbal consent to proceed.  History of Present Illness Raymond Ray is currently doing well with no complaints. He has an upcoming hernia surgery scheduled for January, which will require management of his anticoagulation therapy. He notes that he has been feeling well and he exercises regularly, attending sessions with a trainer twice a week and using the elliptical, totaling at least 120 minutes of exercise per week. He feels well during workouts, with no issues related to breathing or heart rate, which he describes as 'very steady these days'.  He hasn't been disturbed by palpitations and notes that his heart rate hs been stable.  He reports that any swelling in his right ankle resolves by morning. He takes rosuvastatin  for cholesterol and sees a hematologist for monitoring his anemia which remain stable around 11. He reports seeing his hematologist every six months.  ROS:  As per HPI  Studies Reviewed: Raymond Ray   EKG Interpretation Date/Time:  Monday February 01 2024 10:24:07 EST Ventricular Rate:  82 PR Interval:    QRS Duration:  82 QT Interval:  356 QTC Calculation: 415 R Axis:   81  Text Interpretation: Atrial fibrillation Abnormal ekg Since last tracing rate faster Confirmed by Raymond Ray ([id number]) on 02/01/2024 10:37:09 AM   Echo 04/2022:    1. Left ventricular  ejection fraction, by estimation, is 55%. The left  ventricle has normal function. The left ventricle has no regional wall  motion abnormalities. Left ventricular diastolic parameters are  indeterminate.   2. Right ventricular systolic function is mildly reduced. The right  ventricular size is mildly enlarged. There is mildly elevated pulmonary  artery systolic pressure. The estimated right ventricular systolic  pressure is 43.2 mmHg.   3. Left atrial size was severely dilated.   4. Right atrial size was severely dilated.   5. The mitral valve has been repaired/replaced. Trivial mitral valve  regurgitation. No evidence of mitral stenosis. The mean mitral valve  gradient is 4.0 mmHg at a HR 47bpm. There is a 36 mm Medtronic Sinuform  annuloplasty ring present in the mitral  position.   6. Tricuspid valve regurgitation is moderate.   7. The aortic valve is tricuspid. Aortic valve regurgitation is trivial.  Aortic valve sclerosis is present, with no evidence of aortic valve  stenosis.   8. Aortic dilatation noted. There is borderline dilatation of the aortic  root, measuring 38 mm.   9. The inferior vena cava is normal in size with  greater than 50%  respiratory variability, suggesting right atrial pressure of 3 mmHg.   Risk Assessment/Calculations:    CHA2DS2-VASc Score = 3   This indicates a 3.2% annual risk of stroke. The patient's score is based upon: CHF History: 0 HTN History: 1 Diabetes History: 0 Stroke History: 0 Vascular Disease History: 1 Age Score: 1 Gender Score: 0            Physical Exam:   VS:  BP (!) 98/58 (BP Location: Right Arm, Patient Position: Sitting, Cuff Size: Normal)   Pulse 82   Ht 6' 3 (1.905 m)   Wt 179 lb 11.2 oz (81.5 kg)   SpO2 99%   BMI 22.46 kg/m  , BMI Body mass index is 22.46 kg/m. GENERAL:  Well appearing HEENT: Pupils equal round and reactive, fundi not visualized, oral mucosa unremarkable NECK:  No jugular venous distention,  waveform within normal limits, carotid upstroke brisk and symmetric, no bruits, no thyromegaly LUNGS:  Clear to auscultation bilaterally HEART:  Irregularly irregular.  PMI not displaced or sustained,S1 and S2 within normal limits, no S3, no S4, no clicks, no rubs, no murmurs ABD:  Flat, positive bowel sounds normal in frequency in pitch, no bruits, no rebound, no guarding, no midline pulsatile mass, no hepatomegaly, no splenomegaly EXT:  2 plus pulses throughout, no edema, no cyanosis no clubbing SKIN:  No rashes no nodules NEURO:  Cranial nerves II through XII grossly intact, motor grossly intact throughout PSYCH:  Cognitively intact, oriented to person place and time   ASSESSMENT AND PLAN: .    Assessment & Plan # Permanent atrial fibrillation Managed with Eliquis . Appendage clipping during valve repair reduces thromboembolic risk, but residual risk persists. Heart rate is well-managed.  He has metoprolol  as needed but hasn't needed it.  - Continue Eliquis  for anticoagulation.  # Nonrheumatic mitral valve regurgitation:  He is euvolemic and doing well.  Echocardiogram from March 2024 was normal. - Schedule echocardiogram in 2026 to monitor mitral valve function. - Advised to report any new symptoms such as swelling or dyspnea.  # Hyperlipidemia:  Well-controlled on rosuvastatin .  Continue with regular exercise.      Dispo: f/u in 1 year  Signed, Annabella Scarce, MD

## 2024-02-01 NOTE — Telephone Encounter (Signed)
 Patient seen by Dr Raford today who ok'd upcoming surgery   Letter sent to [facility] CMA

## 2024-02-01 NOTE — Patient Instructions (Signed)
 Medication Instructions:  Your physician recommends that you continue on your current medications as directed. Please refer to the Current Medication list given to you today.   *If you need a refill on your cardiac medications before your next appointment, please call your pharmacy*  Lab Work: NONE  Testing/Procedures: Your physician has requested that you have an echocardiogram. Echocardiography is a painless test that uses sound waves to create images of your heart. It provides your doctor with information about the size and shape of your heart and how well your heart's chambers and valves are working. This procedure takes approximately one hour. There are no restrictions for this procedure. Please do NOT wear cologne, perfume, aftershave, or lotions (deodorant is allowed). Please arrive 15 minutes prior to your appointment time.  Please note: We ask at that you not bring children with you during ultrasound (echo/ vascular) testing. Due to room size and safety concerns, children are not allowed in the ultrasound rooms during exams. Our front office staff cannot provide observation of children in our lobby area while testing is being conducted. An adult accompanying a patient to their appointment will only be allowed in the ultrasound room at the discretion of the ultrasound technician under special circumstances. We apologize for any inconvenience.  1 YEAR ABOUT A WEEK OR TWO PRIOR TO FOLLOW UP   Follow-Up: At [facility], you and your health needs are our priority.  As part of our continuing mission to provide you with exceptional heart care, our providers are all part of one team.  This team includes your primary Cardiologist (physician) and Advanced Practice Providers or APPs (Physician Assistants and Nurse Practitioners) who all work together to provide you with the care you need, when you need it.  Your next appointment:   12 month(s)  Provider:   Annabella Scarce, MD,  Rosaline Bane, NP, or Reche Finder, NP   We recommend signing up for the patient portal called MyChart.  Sign up information is provided on this After Visit Summary.  MyChart is used to connect with patients for Virtual Visits (Telemedicine).  Patients are able to view lab/test results, encounter notes, upcoming appointments, etc.  Non-urgent messages can be sent to your provider as well.   To learn more about what you can do with MyChart, go to [email or web address].

## 2024-02-07 NOTE — Progress Notes (Unsigned)
 [facility] Telephone:([phone]   [phone]  PROGRESS NOTE  Patient Care Team: Shayne Anes, MD as PCP - General (Internal Medicine) Raford Riggs, MD as PCP - Cardiology (Cardiology) Tobie Tonita POUR, DO as Consulting Physician (Neurology) Grayce Buddle, RN Nurse Navigator as Registered Nurse (Medical Oncology)  Hematological/Oncological History # Macrocytic Anemia # Low Grade Myelodysplastic Syndrome  03/03/2011: WBC 4.1, Hgb 12.7, MCV 104.6, Plt 203 01/30/2017: WBC 12.1, Hgb 9.2, MCV 100.7, Plt 232 09/28/2019: WBC 24, Hgb 9.3, MCV 107.9, Plt 189 06/21/2020: WBC 10.8, Hgb 8.5, MCV 107.1, Plt 264 01/07/2021: establish care with Dr. Federico     02/12/2021: Bmbx showed a hypercellular marrow with dyspoiesis and ring sideroblasts  Interval History:  Raymond Ray 73 y.o. male with medical history significant for low-grade myelodysplastic syndrome who presents for a follow up visit. The patient's last visit was on 08/10/2023. In the interim since the last visit he has had no major changes in his health.  On exam today Raymond Ray notes he has been well overall in the interim since our last visit.  He had no major changes in his health and reports his energy levels are quite good for 73.  He reports that he is only limited by his neuropathy and not by his energy levels.  He is not having any lightheadedness, dizziness, shortness of breath.  He denies any bleeding, bruising, or dark stools.  He reports that he is prone to getting nosebleeds during the dry winter months but has not had any yet.  He reports that he has had no runny nose, sore throat, cough.  He did have a COVID infection about 2 months ago and reports it was very mild.  He is up-to-date on his vaccinations including his flu shot.  He reports his appetite is good and his weight is up 2 to 3 pounds, but he would like to get his weight back down to 176 or 177.  Otherwise he is at his baseline level of  health and has no questions concerns or complaints today.  A full 10 point ROS is otherwise negative.  MEDICAL HISTORY:  Past Medical History:  Diagnosis Date   Allergy    Anemia    history of   Ascending aortic aneurysm 05/29/2020   CAD in native artery 05/29/2020   35% LAD on cath.  LDL was 74 on 04/2018.  LDL goal is less than 70.  It was 46 on 12/2019.  Continue rosuvastatin .   Carotid stenosis 05/29/2020   Chronic atrial fibrillation (HCC)    Dysrhythmia 1970s   Exertional dyspnea 04/11/2020   Heart murmur    History of colonoscopy 04/2001   negative   Hypertension    Mitral valve prolapse    Nonrheumatic mitral (valve) insufficiency    Osteoarthritis of hip    Permanent atrial fibrillation (HCC)    PONV (postoperative nausea and vomiting)    left knee cartilage removal;     Prostate cancer (HCC)    S/P minimally-invasive mitral valve repair 09/28/2019   Complex valvuloplasty including artificial Gore-tex neochord placement x6 with 36 mm Medtronic Sinuform annuloplasty ring via right mini thoracotomy approach   Tricuspid regurgitation    Vertigo 01/07/2017   currently not symptomatic     SURGICAL HISTORY: Past Surgical History:  Procedure Laterality Date   BUBBLE STUDY  08/24/2019   Procedure: BUBBLE STUDY;  Surgeon: Loni Soyla LABOR, MD;  Location: [facility] ENDOSCOPY;  Service: Cardiology;;   CARDIAC CATHETERIZATION  09/28/2019   CLIPPING OF ATRIAL APPENDAGE N/A 09/28/2019   Procedure: CLIPPING OF ATRIAL APPENDAGE USING ATRICURE CLIP SIZE ;  Surgeon: Dusty Sudie DEL, MD;  Location: [facility] OR;  Service: Open Heart Surgery;  Laterality: N/A;   COLONOSCOPY     KNEE SURGERY     left at age 53 [place]   LYMPHADENECTOMY Bilateral 06/02/2019   Procedure: LYMPHADENECTOMY, PELVIC;  Surgeon: Renda Glance, MD;  Location: [facility] ORS;  Service: Urology;  Laterality: Bilateral;   MINIMALLY INVASIVE TRICUSPID VALVE REPAIR Right 09/28/2019   Procedure: possible MINIMALLY INVASIVE TRICUSPID VALVE  REPAIR;  Surgeon: Dusty Sudie DEL, MD;  Location: [facility] OR;  Service: Open Heart Surgery;  Laterality: Right;   MITRAL VALVE REPAIR Right 09/28/2019   Procedure: MINIMALLY INVASIVE MITRAL VALVE REPAIR (MVR) USING SIMUFORM ;  Surgeon: Dusty Sudie DEL, MD;  Location: [facility] OR;  Service: Open Heart Surgery;  Laterality: Right;   PROSTATE BIOPSY     RIGHT/LEFT HEART CATH AND CORONARY ANGIOGRAPHY N/A 09/07/2019   Procedure: RIGHT/LEFT HEART CATH AND CORONARY ANGIOGRAPHY;  Surgeon: Wonda Sharper, MD;  Location: [facility] INVASIVE CV LAB;  Service: Cardiovascular;  Laterality: N/A;   ROBOT ASSISTED LAPAROSCOPIC RADICAL PROSTATECTOMY N/A 06/02/2019   Procedure: XI ROBOTIC ASSISTED LAPAROSCOPIC RADICAL PROSTATECTOMY LEVEL 2;  Surgeon: Renda Glance, MD;  Location: [facility] ORS;  Service: Urology;  Laterality: N/A;   spinal injection     December 2013   TEE WITHOUT CARDIOVERSION N/A 08/24/2019   Procedure: TRANSESOPHAGEAL ECHOCARDIOGRAM (TEE);  Surgeon: Loni Soyla LABOR, MD;  Location: [facility] ENDOSCOPY;  Service: Cardiology;  Laterality: N/A;   TEE WITHOUT CARDIOVERSION N/A 09/28/2019   Procedure: TRANSESOPHAGEAL ECHOCARDIOGRAM (TEE);  Surgeon: Dusty Sudie DEL, MD;  Location: [facility] OR;  Service: Open Heart Surgery;  Laterality: N/A;   TOTAL HIP ARTHROPLASTY Right 01/29/2017   Procedure: RIGHT TOTAL HIP ARTHROPLASTY ANTERIOR APPROACH;  Surgeon: Fidel Rogue, MD;  Location: [facility] ORS;  Service: Orthopedics;  Laterality: Right;   TOTAL HIP ARTHROPLASTY Left 06/20/2020   Procedure: TOTAL HIP ARTHROPLASTY ANTERIOR APPROACH;  Surgeon: Fidel Rogue, MD;  Location: [facility] ORS;  Service: Orthopedics;  Laterality: Left;   VASECTOMY      SOCIAL HISTORY: Social History   Socioeconomic History   Marital status: Married    Spouse name: Arland   Number of children: 2   Years of education: Not on file   Highest education level: Not on file  Occupational History   Occupation: Retired  Tobacco Use   Smoking status: Never   Smokeless  tobacco: Never  Vaping Use   Vaping status: Never Used  Substance and Sexual Activity   Alcohol  use: No   Drug use: No   Sexual activity: Yes  Other Topics Concern   Not on file  Social History Narrative   Right handed   Lives in two story home with wife   Daughter, Delon, resides in [place]. 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 Daughter, Randine, resides in [place]. 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    Social Drivers of Health   Tobacco Use: Low Risk (02/01/2024)   Patient History    Smoking Tobacco Use: Never    Smokeless Tobacco Use: Never    Passive Exposure: Not on file  Financial Resource Strain: Not on file  Food Insecurity: Not on file  Transportation Needs: Not on file  Physical Activity: Not on file  Stress: Not on file  Social Connections: Not on file  Intimate Partner Violence: Not on file  Depression (  EYV7-0): Not on file  Alcohol  Screen: Not on file  Housing: Unknown (12/28/2023)   Received from [facility]   Epic    Unable to Pay for Housing in the Last Year: Not on file    Number of Times Moved in the Last Year: Not on file    At any time in the past 12 months, were you homeless or living in a shelter (including now)?: No  Utilities: Not on file  Health Literacy: Not on file    FAMILY HISTORY: Family History  Problem Relation Age of Onset   Arrhythmia Father        tachycardia   Cancer Mother    Heart attack Mother    Diabetes Mother    Breast cancer Sister    Breast cancer Paternal Aunt    Colon cancer Neg Hx    Esophageal cancer Neg Hx    Stomach cancer Neg Hx    Rectal cancer Neg Hx     ALLERGIES:  is allergic to amlodipine .  MEDICATIONS:  Current Outpatient Medications  Medication Sig Dispense Refill   amoxicillin (AMOXIL) 500 MG capsule Take 2,000 mg by mouth as directed. Prior to dental procedures     ELIQUIS  5 MG TABS tablet TAKE 1 TABLET BY MOUTH TWICE A DAY 180 tablet 1   fexofenadine (ALLEGRA) 180 MG tablet Take 180 mg  by mouth daily as needed for allergies.     hydrocortisone cream 1 % Apply 1 application topically 2 (two) times daily as needed for itching.     isosorbide  mononitrate (IMDUR ) 30 MG 24 hr tablet TAKE 1/2 OF A TABLET (15 MG TOTAL) BY MOUTH DAILY 45 tablet 3   Multiple Vitamins-Minerals (MULTIVITAMIN WITH MINERALS) tablet Take 1 tablet by mouth daily. [facility] men      polyethylene glycol (MIRALAX  / GLYCOLAX ) 17 g packet Take 17 g by mouth daily.     pregabalin (LYRICA) 50 MG capsule Take 50 mg by mouth 6 (six) times daily.     rosuvastatin  (CRESTOR ) 5 MG tablet Take 5 mg by mouth daily.     zolpidem  (AMBIEN ) 5 MG tablet Take 5 mg by mouth at bedtime.     No current facility-administered medications for this visit.    REVIEW OF SYSTEMS:   Constitutional: ( - ) fevers, ( - )  chills , ( - ) night sweats Eyes: ( - ) blurriness of vision, ( - ) double vision, ( - ) watery eyes Ears, nose, mouth, throat, and face: ( - ) mucositis, ( - ) sore throat Respiratory: ( - ) cough, ( - ) dyspnea, ( - ) wheezes Cardiovascular: ( - ) palpitation, ( - ) chest discomfort, ( - ) lower extremity swelling Gastrointestinal:  ( - ) nausea, ( - ) heartburn, ( - ) change in bowel habits Skin: ( - ) abnormal skin rashes Lymphatics: ( - ) new lymphadenopathy, ( - ) easy bruising Neurological: ( - ) numbness, ( - ) tingling, ( - ) new weaknesses Behavioral/Psych: ( - ) mood change, ( - ) new changes  All other systems were reviewed with the patient and are negative.  PHYSICAL EXAMINATION: Vitals:   02/08/24 1054  BP: 135/68  Pulse: 79  Resp: 16  Temp: 98.3 F (36.8 C)  SpO2: 100%     Filed Weights   02/08/24 1054  Weight: 180 lb 11.2 oz (82 kg)      GENERAL: Well-appearing elderly Caucasian male, alert, no distress  and comfortable SKIN: skin color, texture, turgor are normal, no rashes or significant lesions EYES: conjunctiva are pink and non-injected, sclera clear LUNGS: clear to auscultation and  percussion with normal breathing effort HEART: regular rate & rhythm and no murmurs and no lower extremity edema Musculoskeletal: no cyanosis of digits and no clubbing  PSYCH: alert & oriented x 3, fluent speech NEURO: no focal motor/sensory deficits  LABORATORY DATA:  I have reviewed the data as listed    Latest Ref Rng & Units 02/08/2024   10:37 AM 08/10/2023    8:56 AM 02/05/2023    9:32 AM  CBC  WBC 4.0 - 10.5 K/uL 6.3  4.0  4.8   Hemoglobin 13.0 - 17.0 g/dL 88.7  88.7  88.4   Hematocrit 39.0 - 52.0 % 33.2  32.1  33.4   Platelets 150 - 400 K/uL 324  322  298        Latest Ref Rng & Units 08/10/2023    8:56 AM 02/05/2023    9:32 AM 11/06/2022    9:15 AM  CMP  Glucose 70 - 99 mg/dL 96  879  862   BUN 8 - 23 mg/dL 22  21  18    Creatinine 0.61 - 1.24 mg/dL 8.87  8.85  8.73   Sodium 135 - 145 mmol/L 143  143  143   Potassium 3.5 - 5.1 mmol/L 4.4  4.4  4.3   Chloride 98 - 111 mmol/L 108  108  107   CO2 22 - 32 mmol/L 30  29  31    Calcium  8.9 - 10.3 mg/dL 9.3  9.3  9.8   Total Protein 6.5 - 8.1 g/dL 6.8  6.7  7.0   Total Bilirubin 0.0 - 1.2 mg/dL 1.9  1.6  2.0   Alkaline Phos 38 - 126 U/L 49  56  58   AST 15 - 41 U/L 16  14  14    ALT 0 - 44 U/L 11  10  11      Lab Results  Component Value Date   MPROTEIN Not Observed 01/07/2021   Lab Results  Component Value Date   KPAFRELGTCHN 27.7 (H) 01/07/2021   LAMBDASER 17.4 01/07/2021   KAPLAMBRATIO 1.59 01/07/2021   RADIOGRAPHIC STUDIES: No results found.  ASSESSMENT & PLAN Raymond Ray 73 y.o. male with medical history significant for low-grade myelodysplastic syndrome who presents for a follow up visit.   # Macrocytic Anemia # Low Grade Myelodysplastic Syndrome -- No evidence of nutritional deficiency or other possible causes of the patient's macrocytic anemia --Bone marrow biopsy was performed on 02/12/2021 and showed evidence of what appears to be a low-grade myelodysplastic syndrome with ring  sideroblasts --Patient has had a low-grade anemia and macrocytosis for at least 5 years with evidence of this dating back 10 years.  Appears to be a stable process --Discussed with him that the treatment of choice would be erythropoietin shots if his hemoglobin were to consistently be less than 10.  No clear indication for treatment at this time. --labs today show white blood cell count 6.3, hemoglobin 11.2, MCV 103.8, platelets 324 --Recommend return to clinic in 6 months time to reevaluate  # Bradycardia--resolved -- Patient noted to have low heart rate on prior visits.  Discontinue metoprolol  and digoxin  -- HR 79 today, WNL   No orders of the defined types were placed in this encounter.   All questions were answered. The patient knows to call the clinic with any problems, questions  or concerns.  A total of more than 25 minutes were spent on this encounter with face-to-face time and non-face-to-face time, including preparing to see the patient, ordering tests and/or medications, counseling the patient and coordination of care as outlined above.   Norleen IVAR Kidney, MD Department of Hematology/Oncology [facility] Phone: [phone] Pager: [phone] Email: [email or web address]  02/08/2024 11:03 AM

## 2024-02-08 ENCOUNTER — Other Ambulatory Visit: Payer: Self-pay | Admitting: Hematology and Oncology

## 2024-02-08 ENCOUNTER — Inpatient Hospital Stay: Attending: Hematology and Oncology | Admitting: Hematology and Oncology

## 2024-02-08 ENCOUNTER — Encounter: Payer: Self-pay | Admitting: Hematology and Oncology

## 2024-02-08 ENCOUNTER — Inpatient Hospital Stay

## 2024-02-08 DIAGNOSIS — Z803 Family history of malignant neoplasm of breast: Secondary | ICD-10-CM | POA: Diagnosis not present

## 2024-02-08 DIAGNOSIS — D4620 Refractory anemia with excess of blasts, unspecified: Secondary | ICD-10-CM | POA: Insufficient documentation

## 2024-02-08 DIAGNOSIS — C61 Malignant neoplasm of prostate: Secondary | ICD-10-CM

## 2024-02-08 DIAGNOSIS — D539 Nutritional anemia, unspecified: Secondary | ICD-10-CM

## 2024-02-08 DIAGNOSIS — Z8546 Personal history of malignant neoplasm of prostate: Secondary | ICD-10-CM | POA: Insufficient documentation

## 2024-02-08 LAB — CBC WITH DIFFERENTIAL (CANCER CENTER ONLY)
Abs Immature Granulocytes: 0.01 K/uL (ref 0.00–0.07)
Basophils Absolute: 0.1 K/uL (ref 0.0–0.1)
Basophils Relative: 1 %
Eosinophils Absolute: 0.3 K/uL (ref 0.0–0.5)
Eosinophils Relative: 4 %
HCT: 33.2 % — ABNORMAL LOW (ref 39.0–52.0)
Hemoglobin: 11.2 g/dL — ABNORMAL LOW (ref 13.0–17.0)
Immature Granulocytes: 0 %
Lymphocytes Relative: 20 %
Lymphs Abs: 1.2 K/uL (ref 0.7–4.0)
MCH: 35 pg — ABNORMAL HIGH (ref 26.0–34.0)
MCHC: 33.7 g/dL (ref 30.0–36.0)
MCV: 103.8 fL — ABNORMAL HIGH (ref 80.0–100.0)
Monocytes Absolute: 0.6 K/uL (ref 0.1–1.0)
Monocytes Relative: 9 %
Neutro Abs: 4.2 K/uL (ref 1.7–7.7)
Neutrophils Relative %: 66 %
Platelet Count: 324 K/uL (ref 150–400)
RBC: 3.2 MIL/uL — ABNORMAL LOW (ref 4.22–5.81)
RDW: 20.1 % — ABNORMAL HIGH (ref 11.5–15.5)
WBC Count: 6.3 K/uL (ref 4.0–10.5)
nRBC: 0 % (ref 0.0–0.2)

## 2024-02-08 LAB — CMP (CANCER CENTER ONLY)
ALT: 12 U/L (ref 0–44)
AST: 23 U/L (ref 15–41)
Albumin: 4.7 g/dL (ref 3.5–5.0)
Alkaline Phosphatase: 67 U/L (ref 38–126)
Anion gap: 9 (ref 5–15)
BUN: 19 mg/dL (ref 8–23)
CO2: 28 mmol/L (ref 22–32)
Calcium: 9.6 mg/dL (ref 8.9–10.3)
Chloride: 107 mmol/L (ref 98–111)
Creatinine: 1.12 mg/dL (ref 0.61–1.24)
GFR, Estimated: >60 mL/min (ref >60)
Glucose, Bld: 85 mg/dL (ref 70–99)
Potassium: 4.5 mmol/L (ref 3.5–5.1)
Sodium: 144 mmol/L (ref 135–145)
Total Bilirubin: 1.6 mg/dL — ABNORMAL HIGH (ref 0.0–1.2)
Total Protein: 7.3 g/dL (ref 6.5–8.1)

## 2024-02-08 LAB — LACTATE DEHYDROGENASE: LDH: 154 U/L (ref 105–235)

## 2024-02-08 LAB — PSA: Prostatic Specific Antigen: <0.02 ng/mL (ref 0.00–4.00)

## 2024-03-17 NOTE — Pre-Procedure Instructions (Signed)
 Surgical Instructions   Your procedure is scheduled on March 22, 2024. Report to [facility] Main Entrance A at 10:50 A.M., then check in with the Admitting office. Any questions or running late day of surgery: call [phone]  Questions prior to your surgery date: call [phone], Monday-Friday, 8am-4pm. If you experience any cold or flu symptoms such as cough, fever, chills, shortness of breath, etc. between now and your scheduled surgery, please notify us  at the above number.     Remember:  Do not eat after midnight the night before your surgery   You may drink clear liquids until 9:50 AM the morning of your surgery.   Clear liquids allowed are: Water , Non-Citrus Juices (without pulp), Carbonated Beverages, Clear Tea (no milk, honey, etc.), Black Coffee Only (NO MILK, CREAM OR POWDERED CREAMER of any kind), and Gatorade.  Patient Instructions  The night before surgery:  No food after midnight. ONLY clear liquids after midnight  The day of surgery (if you do NOT have diabetes):  Drink ONE (1) Pre-Surgery Clear Ensure by 9:50AM the morning of surgery. Drink in one sitting. Do not sip.  This drink was given to you during your hospital  pre-op appointment visit.  Nothing else to drink after completing the  Pre-Surgery Clear Ensure.         If you have questions, please contact your surgeons office.    Take these medicines the morning of surgery with A SIP OF WATER : isosorbide  mononitrate (IMDUR )  pregabalin (LYRICA  rosuvastatin  (CRESTOR )   May take these medicines IF NEEDED: fexofenadine (ALLEGRA)   Follow your surgeon's instructions on stopping Eliquis . If no instructions were given, please contact your surgeon's office.   One week prior to surgery, STOP taking any Aspirin  (unless otherwise instructed by your surgeon) Aleve, Naproxen, Ibuprofen, Motrin, Advil, Goody's, BC's, all herbal medications, fish oil, and non-prescription vitamins.                     Do  NOT Smoke (Tobacco/Vaping) for 24 hours prior to your procedure.  If you use a CPAP at night, you may bring your mask/headgear for your overnight stay.   You will be asked to remove any contacts, glasses, piercing's, hearing aid's, dentures/partials prior to surgery. Please bring cases for these items if needed.    Your surgeon will determine if you are to be admitted or discharged the same day.  Patients discharged the day of surgery will not be allowed to drive home, and someone needs to stay with them for 24 hours.  SURGICAL WAITING ROOM VISITATION Patients may have no more than 2 support people in the waiting area - these visitors may rotate.   Pre-op nurse will coordinate an appropriate time for 2 ADULT support persons, who may not rotate, to accompany patient in pre-op.  Children under the age of 25 must have an adult with them who is not the patient and must remain in the main waiting area with an adult.  If the patient needs to stay at the hospital during part of their recovery, the visitor guidelines for inpatient rooms apply.  Please refer to the [facility] website for the visitor guidelines for any additional information.   If you received a COVID test during your pre-op visit  it is requested that you wear a mask when out in public, stay away from anyone that may not be feeling well and notify your surgeon if you develop symptoms. If you have been in contact  with anyone that has tested positive in the last 10 days please notify you surgeon.      Pre-operative CHG Bathing Instructions   You can play a key role in reducing the risk of infection after surgery. Your skin needs to be as free of germs as possible. You can reduce the number of germs on your skin by washing with CHG (chlorhexidine  gluconate) soap before surgery. CHG is an antiseptic soap that kills germs and continues to kill germs even after washing.   DO NOT use if you have an allergy to chlorhexidine /CHG or  antibacterial soaps. If your skin becomes reddened or irritated, stop using the CHG and notify one of our RNs at [phone].              TAKE A SHOWER THE NIGHT BEFORE SURGERY   Please keep in mind the following:  DO NOT shave, including legs and underarms, 48 hours prior to surgery.   You may shave your face before/day of surgery.  Place clean sheets on your bed the night before surgery Use a clean washcloth (not used since being washed) for shower. DO NOT sleep with pet's night before surgery.  CHG Shower Instructions:  Wash your face and private area with normal soap. If you choose to wash your hair, wash first with your normal shampoo.  After you use shampoo/soap, rinse your hair and body thoroughly to remove shampoo/soap residue.  Turn the water  OFF and apply half the bottle of CHG soap to a CLEAN washcloth.  Apply CHG soap ONLY FROM YOUR NECK DOWN TO YOUR TOES (washing for 3-5 minutes)  DO NOT use CHG soap on face, private areas, open wounds, or sores.  Pay special attention to the area where your surgery is being performed.  If you are having back surgery, having someone wash your back for you may be helpful. Wait 2 minutes after CHG soap is applied, then you may rinse off the CHG soap.  Pat dry with a clean towel  Put on clean pajamas    Additional instructions for the day of surgery: If you choose, you may shower the morning of surgery with an antibacterial soap.  DO NOT APPLY any lotions, deodorants, cologne, or perfumes.   Do not wear jewelry or makeup Do not wear nail polish, gel polish, artificial nails, or any other type of covering on natural nails (fingers and toes) Do not bring valuables to the hospital. [facility] is not responsible for valuables/personal belongings. Put on clean/comfortable clothes.  Please brush your teeth.  Ask your nurse before applying any prescription medications to the skin.

## 2024-03-18 ENCOUNTER — Encounter (HOSPITAL_COMMUNITY): Payer: Self-pay

## 2024-03-18 ENCOUNTER — Encounter (HOSPITAL_COMMUNITY)
Admission: RE | Admit: 2024-03-18 | Discharge: 2024-03-18 | Disposition: A | Source: Ambulatory Visit | Attending: General Surgery

## 2024-03-18 ENCOUNTER — Other Ambulatory Visit: Payer: Self-pay

## 2024-03-18 VITALS — BP 103/69 | HR 70 | Temp 98.2°F | Resp 17 | Ht 75.0 in | Wt 180.7 lb

## 2024-03-18 DIAGNOSIS — I712 Thoracic aortic aneurysm, without rupture, unspecified: Secondary | ICD-10-CM | POA: Diagnosis not present

## 2024-03-18 DIAGNOSIS — Z7901 Long term (current) use of anticoagulants: Secondary | ICD-10-CM | POA: Insufficient documentation

## 2024-03-18 DIAGNOSIS — I4891 Unspecified atrial fibrillation: Secondary | ICD-10-CM | POA: Insufficient documentation

## 2024-03-18 DIAGNOSIS — I10 Essential (primary) hypertension: Secondary | ICD-10-CM | POA: Insufficient documentation

## 2024-03-18 DIAGNOSIS — Z96643 Presence of artificial hip joint, bilateral: Secondary | ICD-10-CM | POA: Insufficient documentation

## 2024-03-18 DIAGNOSIS — I2510 Atherosclerotic heart disease of native coronary artery without angina pectoris: Secondary | ICD-10-CM | POA: Insufficient documentation

## 2024-03-18 DIAGNOSIS — Z01812 Encounter for preprocedural laboratory examination: Secondary | ICD-10-CM | POA: Insufficient documentation

## 2024-03-18 DIAGNOSIS — Z8546 Personal history of malignant neoplasm of prostate: Secondary | ICD-10-CM | POA: Diagnosis not present

## 2024-03-18 DIAGNOSIS — Z952 Presence of prosthetic heart valve: Secondary | ICD-10-CM | POA: Insufficient documentation

## 2024-03-18 DIAGNOSIS — D469 Myelodysplastic syndrome, unspecified: Secondary | ICD-10-CM | POA: Insufficient documentation

## 2024-03-18 DIAGNOSIS — Z79899 Other long term (current) drug therapy: Secondary | ICD-10-CM | POA: Diagnosis not present

## 2024-03-18 DIAGNOSIS — K4020 Bilateral inguinal hernia, without obstruction or gangrene, not specified as recurrent: Secondary | ICD-10-CM | POA: Diagnosis not present

## 2024-03-18 DIAGNOSIS — E785 Hyperlipidemia, unspecified: Secondary | ICD-10-CM | POA: Insufficient documentation

## 2024-03-18 DIAGNOSIS — Z01818 Encounter for other preprocedural examination: Secondary | ICD-10-CM

## 2024-03-18 DIAGNOSIS — I4821 Permanent atrial fibrillation: Secondary | ICD-10-CM | POA: Diagnosis not present

## 2024-03-18 HISTORY — DX: Myelodysplastic syndrome, unspecified: D46.9

## 2024-03-18 LAB — CBC
HCT: 31 % — ABNORMAL LOW (ref 39.0–52.0)
Hemoglobin: 10.6 g/dL — ABNORMAL LOW (ref 13.0–17.0)
MCH: 35 pg — ABNORMAL HIGH (ref 26.0–34.0)
MCHC: 34.2 g/dL (ref 30.0–36.0)
MCV: 102.3 fL — ABNORMAL HIGH (ref 80.0–100.0)
Platelets: 312 K/uL (ref 150–400)
RBC: 3.03 MIL/uL — ABNORMAL LOW (ref 4.22–5.81)
RDW: 20.5 % — ABNORMAL HIGH (ref 11.5–15.5)
WBC: 5.9 K/uL (ref 4.0–10.5)
nRBC: 0 % (ref 0.0–0.2)

## 2024-03-18 LAB — BASIC METABOLIC PANEL WITH GFR
Anion gap: 8 (ref 5–15)
BUN: 22 mg/dL (ref 8–23)
CO2: 27 mmol/L (ref 22–32)
Calcium: 9.4 mg/dL (ref 8.9–10.3)
Chloride: 105 mmol/L (ref 98–111)
Creatinine, Ser: 1.1 mg/dL (ref 0.61–1.24)
GFR, Estimated: >60 mL/min
Glucose, Bld: 90 mg/dL (ref 70–99)
Potassium: 4.8 mmol/L (ref 3.5–5.1)
Sodium: 140 mmol/L (ref 135–145)

## 2024-03-18 NOTE — Progress Notes (Signed)
 PCP - Shayne Anes, MD  Cardiologist - Raford Riggs, MD   PPM/ICD - denies   Chest x-ray - NA EKG - 02/01/24 Stress Test - 04/27/20 ECHO - 04/28/22 Cardiac Cath - 09/07/19  Sleep Study - denies   Fasting Blood Sugar - NA  Last dose of GLP1 agonist-  NA   Blood Thinner Instructions: Eliquis - last dose will be on 03/19/24 Aspirin  Instructions: NA  ERAS Protcol - ERAS + ensure per order   COVID TEST-  NA   Anesthesia review: yes- cardiac history- pt saw Cardiology on 02/01/24- pt denies any recent cardiac symptoms.   Patient denies shortness of breath, fever, cough and chest pain at PAT appointment   All instructions explained to the patient, with a verbal understanding of the material. Patient agrees to go over the instructions while at home for a better understanding.  The opportunity to ask questions was provided.

## 2024-03-21 NOTE — Progress Notes (Signed)
 Anesthesia Chart Review:  Case: [id number] Date/Time: 03/22/24 1235   Procedure: REPAIR, HERNIA, INGUINAL, LAPAROSCOPIC (Bilateral) - LAPAROSCOPIC BILATERAL INGUINAL HERNIA REPAIR WITH MESH   Anesthesia type: General   Pre-op diagnosis: BILATERAL INGUINAL HERNIAS   [facility]: [facility] ROOM 10 / [facility]   Surgeons: Rubin Calamity, MD       DISCUSSION: Patient is a 74 year old male scheduled for the above procedure.  History includes never smoker, postoperative N/V, murmur/MVP with severe MR (s/p minimally invasive MV repair with complex valvuloplasty, 36 mm Medtronic annuloplasty ring, LAA clipping 09/28/2019), afib (diagnosed 1986; s/p LAA clipping 09/28/2019), CAD (mild non-obstructive 05/2020), ascending TAA (4.0 cm 08/2019 CTA; 38 mm 04/2022 TTE), HTN, myelodysplastic syndrome, anemia, osteoarthritis (s/p right THA 01/29/2017; left THA 06/20/2020), prostate cancer (s/p robotic assisted radical prostatectomy 06/02/2019).  Cardiology visit on 02/01/2024 with Dr. Raford. He is followed for mitral valve disease, afib, non-obstructive CAD, mild ascending TAA, bradycardia, HTN, HLD. S/p MV repair and LAA clipping on 09/28/2019. He had an echo 04/2022 revealing LVEF 55%, indeterminate diastolic parameters, right ventricular systolic pressure of 43.2 mmHg, trivial mitral and aortic valve regurgitation, moderate tricuspid valve regurgitation, ascending aorta 38 mm. He was in 100% afib by 08/2021 monitor with average HR 90, and digoxin  was increased. PET CT cardiac perfusion study in 11/2021 showed a small area of ischemia in the mid infra-lateral region and severe coronary calcification in the left circumflex, LAD, and RCA region. Study was considered overall low risk, and Imdur  was added to his medical therapy. In December 2024, his metoprolol  and digoxin  were discontinued due to significant bradycardia with a rate in the 30's. At his 02/01/2024 visit, he was doing well and exercising regularly. HR stable off metoprolol  (has  for as needed use) and digoxin . Euvolemic on exam. HLD controlled on rosuvastatin . She noted plans for hernia surgery. One year follow-up planned. In regards to surgery, cardiology (advised he can hold Eliquis  for 3 days priori to surgery.     He is followed by Dr. Federico for macrocytic anemia and low grade myelodysplastic syndrome with ring sideroblasts (by bone marrow biopsy 02/12/2021), last visit 02/08/2024. Some limitations due to neuropathy, but overall he felt his energy was quite good for 73. HGB stable at 11.2. Follow-up six months planned. 03/18/2024 HGB 10.6.   Last Eliquis  03/19/2024.   Anesthesia team to evaluate on the day of surgery.   VS: BP 103/69   Pulse 70   Temp 36.8 C   Resp 17   Ht 6' 3 (1.905 m)   Wt 82 kg   SpO2 99%   BMI 22.59 kg/m    PROVIDERS: Shayne Anes, MD is PCP  Raford Riggs, MD is cardiologist  Federico Norleen CLORE, MD is [facility]   LABS: Labs reviewed: Acceptable for surgery. H/H 10.6/31.0, down from 11.2/33.2 on 02/08/2024 (followed by [facility]). (all labs ordered are listed, but only abnormal results are displayed)  Labs Reviewed  CBC - Abnormal; Notable for the following components:      Result Value   RBC 3.03 (*)    Hemoglobin 10.6 (*)    HCT 31.0 (*)    MCV 102.3 (*)    MCH 35.0 (*)    RDW 20.5 (*)    All other components within normal limits  BASIC METABOLIC PANEL WITH GFR    EKG: 02/01/2024: Afib at 82 bpm   CV: TTE 04/28/2022: IMPRESSIONS   1. Left ventricular ejection fraction, by estimation, is 55%. The left  ventricle  has normal function. The left ventricle has no regional wall  motion abnormalities. Left ventricular diastolic parameters are  indeterminate.   2. Right ventricular systolic function is mildly reduced. The right  ventricular size is mildly enlarged. There is mildly elevated pulmonary  artery systolic pressure. The estimated right ventricular systolic  pressure is 43.2 mmHg.   3. Left atrial size was  severely dilated.   4. Right atrial size was severely dilated.   5. The mitral valve has been repaired/replaced. Trivial mitral valve  regurgitation. No evidence of mitral stenosis. The mean mitral valve  gradient is 4.0 mmHg at a HR 47bpm. There is a 36 mm Medtronic Sinuform  annuloplasty ring present in the mitral  position.   6. Tricuspid valve regurgitation is moderate.   7. The aortic valve is tricuspid. Aortic valve regurgitation is trivial.  Aortic valve sclerosis is present, with no evidence of aortic valve  stenosis.   8. Aortic dilatation noted. There is borderline dilatation of the aortic  root, measuring 38 mm.   9. The inferior vena cava is normal in size with greater than 50%  respiratory variability, suggesting right atrial pressure of 3 mmHg.  - Comparison(s): Prior TTE2/2023, LVEF 50-55%, mean mitral gradient ,  moderate TR.    NM PET CT Cardiac Perfusion Study 11/26/2021:   Findings are consistent with ischemia. The study is low risk.   There is a small mid inferolateral perfusion defect in stress, not present with rest, with decrease in regional stress MPFR (1.59).  This is consistent with ischemia.  This is a small territory of ischemia.   Rest left ventricular function is normal. Stress left ventricular function is normal. End diastolic cavity size is normal. End systolic cavity size is normal.  LVEF Reserve is -2% but suspect this is erroneous in the setting of hyperdynamic function and permanent atrial fibrillation.  Normal TID assessment.   Coronary calcium  was present on the attenuation correction CT images. Severe coronary calcifications were present. Coronary calcifications were present in the left anterior descending artery, left circumflex artery and right coronary artery distribution(s).   Annuloplasty ring visualized. - Reviewed by Vannie Mora, NP with cardiology, Cardiac PET revealed a small area of ischemia. Normal heart muscle function. As the area  of ischemia is small, this is a low risk study which is a reassuring result. Recommend continued medical therapy with Metoprolol , Rosuvastatin .   Recommend addition of Imdur  15mg  QHS for optimization of medical therapy and to help reduce ischemia.   3 Day Zio Monitor 08/26/2021 - 08/29/2021:  Quality: Fair.  Baseline artifact. Predominant rhythm: Atrial fibrillation Average heart rate: 90 bpm Max heart rate: 207 bpm Min heart rate: 41 bpm Pauses >2.5 seconds: None 100% atrial fibrillation burden <1% PVCs  Up to 10 beats of NSVT vs atrial fibrillation with aberrancy    ETT 04/27/2020: Blood pressure demonstrated a normal response to exercise. Horizontal ST segment depression ST segment depression of 1 mm was noted during stress in the III, II, aVF, V6 and V5 leads, beginning at 4 minutes of stress, and returning to baseline after 1-5 minutes of recovery.   Abnormal exercise stress test. Exaggerated heart rate response due to atrial fibrillation. There is borderline ST segment depression during exercise that resolves fairly rapidly in recovery. Although digoxin  effect is not seen on the baseline tracing, cannot exclude false positive response due to digoxin . - Reviewed by Dr. Annabella Scarce, Stress test discussed in clinic.  There is mention of possible digoxin   effect but he was not on digoxin  at the time.  Continue with current plan.   RHC/LHC (PRE-MR REPAIR) 09/07/2019: 1.  Calcified nonobstructive proximal LAD stenosis 2.  Patent coronary arteries with mild diffuse irregularity and no flow-limiting coronary stenoses 3.  Normal right heart filling pressures with preserved cardiac output 4.  20 mm V wave consistent with the patient's known mitral regurgitation - Recommendation: Continued plans for cardiac surgical evaluation for mitral valve repair   US  Carotid 09/26/2019: Summary:  - Right Carotid: Velocities in the right ICA are consistent with a 1-39% stenosis.  - Left  Carotid: Velocities in the left ICA are consistent with a 1-39%  stenosis.  - Vertebrals: Bilateral vertebral arteries demonstrate antegrade flow.  - Subclavians: Normal flow hemodynamics were seen in bilateral subclavian arteries.    Past Medical History:  Diagnosis Date   Allergy    Anemia    history of   Ascending aortic aneurysm 05/29/2020   CAD in native artery 05/29/2020   35% LAD on cath.  LDL was 74 on 04/2018.  LDL goal is less than 70.  It was 46 on 12/2019.  Continue rosuvastatin .   Carotid stenosis 05/29/2020   Chronic atrial fibrillation (HCC)    Dysrhythmia 1970s   Exertional dyspnea 04/11/2020   Heart murmur    History of colonoscopy 04/2001   negative   Hypertension    Mitral valve prolapse    Myelodysplastic syndrome (HCC)    Nonrheumatic mitral (valve) insufficiency    Osteoarthritis of hip    Permanent atrial fibrillation (HCC)    PONV (postoperative nausea and vomiting)    left knee cartilage removal;  1975- no issues since   Prostate cancer (HCC)    S/P minimally-invasive mitral valve repair 09/28/2019   Complex valvuloplasty including artificial Gore-tex neochord placement x6 with 36 mm Medtronic Sinuform annuloplasty ring via right mini thoracotomy approach   Tricuspid regurgitation    Vertigo 01/07/2017   currently not symptomatic     Past Surgical History:  Procedure Laterality Date   BUBBLE STUDY  08/24/2019   Procedure: BUBBLE STUDY;  Surgeon: Loni Soyla LABOR, MD;  Location: [facility] ENDOSCOPY;  Service: Cardiology;;   CARDIAC CATHETERIZATION  09/28/2019   CLIPPING OF ATRIAL APPENDAGE N/A 09/28/2019   Procedure: CLIPPING OF ATRIAL APPENDAGE USING ATRICURE CLIP SIZE ;  Surgeon: Dusty Sudie DEL, MD;  Location: [facility] OR;  Service: Open Heart Surgery;  Laterality: N/A;   COLONOSCOPY     KNEE SURGERY     left at age 35 in [place]   LYMPHADENECTOMY Bilateral 06/02/2019   Procedure: LYMPHADENECTOMY, PELVIC;  Surgeon: Renda Glance, MD;  Location: [facility]  ORS;  Service: Urology;  Laterality: Bilateral;   MINIMALLY INVASIVE TRICUSPID VALVE REPAIR Right 09/28/2019   Procedure: possible MINIMALLY INVASIVE TRICUSPID VALVE REPAIR;  Surgeon: Dusty Sudie DEL, MD;  Location: [facility] OR;  Service: Open Heart Surgery;  Laterality: Right;   MITRAL VALVE REPAIR Right 09/28/2019   Procedure: MINIMALLY INVASIVE MITRAL VALVE REPAIR (MVR) USING SIMUFORM ;  Surgeon: Dusty Sudie DEL, MD;  Location: [facility] OR;  Service: Open Heart Surgery;  Laterality: Right;   PROSTATE BIOPSY     RIGHT/LEFT HEART CATH AND CORONARY ANGIOGRAPHY N/A 09/07/2019   Procedure: RIGHT/LEFT HEART CATH AND CORONARY ANGIOGRAPHY;  Surgeon: Wonda Sharper, MD;  Location: [facility] INVASIVE CV LAB;  Service: Cardiovascular;  Laterality: N/A;   ROBOT ASSISTED LAPAROSCOPIC RADICAL PROSTATECTOMY N/A 06/02/2019   Procedure: XI ROBOTIC ASSISTED LAPAROSCOPIC RADICAL PROSTATECTOMY LEVEL 2;  Surgeon: Renda Glance, MD;  Location: [facility] ORS;  Service: Urology;  Laterality: N/A;   spinal injection     December 2013   TEE WITHOUT CARDIOVERSION N/A 08/24/2019   Procedure: TRANSESOPHAGEAL ECHOCARDIOGRAM (TEE);  Surgeon: Loni Soyla LABOR, MD;  Location: [facility] ENDOSCOPY;  Service: Cardiology;  Laterality: N/A;   TEE WITHOUT CARDIOVERSION N/A 09/28/2019   Procedure: TRANSESOPHAGEAL ECHOCARDIOGRAM (TEE);  Surgeon: Dusty Sudie DEL, MD;  Location: [facility] OR;  Service: Open Heart Surgery;  Laterality: N/A;   TOTAL HIP ARTHROPLASTY Right 01/29/2017   Procedure: RIGHT TOTAL HIP ARTHROPLASTY ANTERIOR APPROACH;  Surgeon: Fidel Rogue, MD;  Location: [facility] ORS;  Service: Orthopedics;  Laterality: Right;   TOTAL HIP ARTHROPLASTY Left 06/20/2020   Procedure: TOTAL HIP ARTHROPLASTY ANTERIOR APPROACH;  Surgeon: Fidel Rogue, MD;  Location: [facility] ORS;  Service: Orthopedics;  Laterality: Left;   VASECTOMY      MEDICATIONS:  amoxicillin (AMOXIL) 500 MG capsule   ELIQUIS  5 MG TABS tablet   fexofenadine (ALLEGRA) 180 MG tablet   hydrocortisone cream  1 %   isosorbide  mononitrate (IMDUR ) 30 MG 24 hr tablet   Multiple Vitamins-Minerals (MULTIVITAMIN WITH MINERALS) tablet   polyethylene glycol (MIRALAX  / GLYCOLAX ) 17 g packet   pregabalin (LYRICA) 50 MG capsule   rosuvastatin  (CRESTOR ) 5 MG tablet   zolpidem  (AMBIEN ) 10 MG tablet   No current facility-administered medications for this encounter.    Isaiah Ruder, PA-C Surgical Short Stay/Anesthesiology [facility] Phone [phone] [facility] Phone [phone] 03/21/2024 10:50 AM

## 2024-03-21 NOTE — Progress Notes (Signed)
 LVM with surgery time change for 03/22/24 [id number], arrival 1015, finish the Pre-Surgery Ensure drink by 0915, stop eating solid food by midnight, and to follow all other instructions given.

## 2024-03-21 NOTE — Anesthesia Preprocedure Evaluation (Signed)
 "                                  Anesthesia Evaluation  Patient identified by MRN, date of birth, ID band Patient awake    Reviewed: Allergy & Precautions, NPO status , Patient's Chart, lab work & pertinent test results  History of Anesthesia Complications (+) PONV and history of anesthetic complications (remote hx PONV 1975, no issues since then)  Airway Mallampati: I  TM Distance: >3 FB Neck ROM: Full    Dental  (+) Teeth Intact, Dental Advisory Given   Pulmonary neg pulmonary ROS   Pulmonary exam normal breath sounds clear to auscultation       Cardiovascular hypertension (113/65 preop), Pt. on medications pulmonary hypertension (mild pHTN on echo)+CHF (normal LVEF, mild reduction in RV systolic function)  Normal cardiovascular exam+ dysrhythmias (eliquis  LD 1/24) Atrial Fibrillation + Valvular Problems/Murmurs (s/p MVR 2021, mod TR)  Rhythm:Regular Rate:Normal  Echo 2024  1. Left ventricular ejection fraction, by estimation, is 55%. The left  ventricle has normal function. The left ventricle has no regional wall  motion abnormalities. Left ventricular diastolic parameters are  indeterminate.   2. Right ventricular systolic function is mildly reduced. The right  ventricular size is mildly enlarged. There is mildly elevated pulmonary  artery systolic pressure. The estimated right ventricular systolic  pressure is 43.2 mmHg.   3. Left atrial size was severely dilated.   4. Right atrial size was severely dilated.   5. The mitral valve has been repaired/replaced. Trivial mitral valve  regurgitation. No evidence of mitral stenosis. The mean mitral valve  gradient is 4.0 mmHg at a HR 47bpm. There is a 36 mm Medtronic Sinuform  annuloplasty ring present in the mitral  position.   6. Tricuspid valve regurgitation is moderate.   7. The aortic valve is tricuspid. Aortic valve regurgitation is trivial.  Aortic valve sclerosis is present, with no evidence of aortic  valve  stenosis.   8. Aortic dilatation noted. There is borderline dilatation of the aortic  root, measuring 38 mm.   9. The inferior vena cava is normal in size with greater than 50%  respiratory variability, suggesting right atrial pressure of 3 mmHg.   severe MR (s/p minimally invasive MV repair with complex valvuloplasty, 36 mm Medtronic annuloplasty ring, LAA clipping 09/28/2019), afib (diagnosed 1986; s/p LAA clipping 09/28/2019), CAD (mild non-obstructive 05/2020), ascending TAA (4.0 cm 08/2019 CTA; 38 mm 04/2022 TTE)   ETT 04/27/2020:  Blood pressure demonstrated a normal response to exercise.  Horizontal ST segment depression ST segment depression of 1 mm was noted during stress in the III, II, aVF, V6 and V5 leads, beginning at 4 minutes of stress, and returning to baseline after 1-5 minutes of recovery.   Abnormal exercise stress test. Exaggerated heart rate response due to atrial fibrillation. There is borderline ST segment depression during exercise that resolves fairly rapidly in recovery. Although digoxin  effect is not seen on the baseline tracing, cannot exclude false positive response due to digoxin . - Reviewed by Dr. Annabella Scarce, Stress test discussed in clinic.  There is mention of possible digoxin  effect but he was not on digoxin  at the time.  Continue with current plan.     RHC/LHC (PRE-MR REPAIR) 09/07/2019: 1.  Calcified nonobstructive proximal LAD stenosis 2.  Patent coronary arteries with mild diffuse irregularity and no flow-limiting coronary stenoses 3.  Normal right heart filling pressures  with preserved cardiac output 4.  20 mm V wave consistent with the patient's known mitral regurgitation - Recommendation: Continued plans for cardiac surgical evaluation for mitral valve repair      Neuro/Psych negative neurological ROS  negative psych ROS   GI/Hepatic negative GI ROS, Neg liver ROS,,,  Endo/Other  negative endocrine ROS    Renal/GU negative  Renal ROS  negative genitourinary   Musculoskeletal  (+) Arthritis , Osteoarthritis,    Abdominal   Peds  Hematology  (+) Blood dyscrasia, anemia Hb 10.6, plt 312 Myelodysplastic syndrome   Anesthesia Other Findings   Reproductive/Obstetrics negative OB ROS                              Anesthesia Physical Anesthesia Plan  ASA: 3  Anesthesia Plan: General   Post-op Pain Management: Tylenol  PO (pre-op)*   Induction: Intravenous  PONV Risk Score and Plan: 3 and Ondansetron , Dexamethasone , Midazolam  and Treatment may vary due to age or medical condition  Airway Management Planned: Oral ETT  Additional Equipment: None  Intra-op Plan:   Post-operative Plan: Extubation in OR  Informed Consent: I have reviewed the patients History and Physical, chart, labs and discussed the procedure including the risks, benefits and alternatives for the proposed anesthesia with the patient or authorized representative who has indicated his/her understanding and acceptance.     Dental advisory given  Plan Discussed with: CRNA  Anesthesia Plan Comments: ( )         Anesthesia Quick Evaluation  "

## 2024-03-22 ENCOUNTER — Ambulatory Visit (HOSPITAL_COMMUNITY)
Admission: RE | Admit: 2024-03-22 | Discharge: 2024-03-22 | Disposition: A | Attending: General Surgery | Admitting: General Surgery

## 2024-03-22 ENCOUNTER — Other Ambulatory Visit: Payer: Self-pay

## 2024-03-22 ENCOUNTER — Ambulatory Visit (HOSPITAL_COMMUNITY): Admitting: Anesthesiology

## 2024-03-22 ENCOUNTER — Encounter (HOSPITAL_COMMUNITY)
Admission: RE | Disposition: A | Discharge status: Home / Self Care | Payer: Self-pay | Source: Home / Self Care | Attending: General Surgery

## 2024-03-22 ENCOUNTER — Encounter (HOSPITAL_COMMUNITY): Payer: Self-pay | Admitting: General Surgery

## 2024-03-22 ENCOUNTER — Encounter (HOSPITAL_COMMUNITY): Payer: Self-pay | Admitting: Vascular Surgery

## 2024-03-22 DIAGNOSIS — I110 Hypertensive heart disease with heart failure: Secondary | ICD-10-CM | POA: Diagnosis not present

## 2024-03-22 DIAGNOSIS — K4020 Bilateral inguinal hernia, without obstruction or gangrene, not specified as recurrent: Secondary | ICD-10-CM

## 2024-03-22 DIAGNOSIS — I2510 Atherosclerotic heart disease of native coronary artery without angina pectoris: Secondary | ICD-10-CM | POA: Diagnosis not present

## 2024-03-22 DIAGNOSIS — I509 Heart failure, unspecified: Secondary | ICD-10-CM

## 2024-03-22 DIAGNOSIS — D176 Benign lipomatous neoplasm of spermatic cord: Secondary | ICD-10-CM | POA: Insufficient documentation

## 2024-03-22 DIAGNOSIS — I4891 Unspecified atrial fibrillation: Secondary | ICD-10-CM | POA: Diagnosis not present

## 2024-03-22 DIAGNOSIS — Z7901 Long term (current) use of anticoagulants: Secondary | ICD-10-CM | POA: Insufficient documentation

## 2024-03-22 MED ORDER — FENTANYL CITRATE (PF) 100 MCG/2ML IJ SOLN
INTRAMUSCULAR | Status: DC | PRN
  Administered 2024-03-22 (×2): 50 ug via INTRAVENOUS

## 2024-03-22 MED ORDER — HYDROMORPHONE HCL 1 MG/ML IJ SOLN: 0.2500 mg | INTRAMUSCULAR | Status: DC | PRN

## 2024-03-22 MED ORDER — BUPIVACAINE-EPINEPHRINE (PF) 0.25% -1:200000 IJ SOLN
INTRAMUSCULAR | Status: AC
  Filled 2024-03-22: qty 30

## 2024-03-22 MED ORDER — CEFAZOLIN SODIUM-DEXTROSE 2-4 GM/100ML-% IV SOLN
INTRAVENOUS | Status: AC
  Filled 2024-03-22: qty 100

## 2024-03-22 MED ORDER — ONDANSETRON HCL 4 MG/2ML IJ SOLN: 4.0000 mg | Freq: Once | INTRAMUSCULAR | Status: DC | PRN

## 2024-03-22 MED ORDER — OXYCODONE HCL 5 MG PO TABS: 5.0000 mg | ORAL_TABLET | Freq: Once | ORAL | Status: DC | PRN

## 2024-03-22 MED ORDER — PROPOFOL 10 MG/ML IV BOLUS
INTRAVENOUS | Status: DC | PRN
  Administered 2024-03-22: 130 mg via INTRAVENOUS

## 2024-03-22 MED ORDER — 0.9 % SODIUM CHLORIDE (POUR BTL) OPTIME
TOPICAL | Status: DC | PRN
  Administered 2024-03-22: 1000 mL

## 2024-03-22 MED ORDER — AMISULPRIDE (ANTIEMETIC) 5 MG/2ML IV SOLN: 10.0000 mg | Freq: Once | INTRAVENOUS | Status: DC | PRN

## 2024-03-22 MED ORDER — MIDAZOLAM HCL 2 MG/2ML IJ SOLN
INTRAMUSCULAR | Status: AC
  Filled 2024-03-22: qty 2

## 2024-03-22 MED ORDER — LIDOCAINE 2% (20 MG/ML) 5 ML SYRINGE
INTRAMUSCULAR | Status: DC | PRN
  Administered 2024-03-22: 60 mg via INTRAVENOUS

## 2024-03-22 MED ORDER — ACETAMINOPHEN 500 MG PO TABS: 1000.0000 mg | ORAL_TABLET | ORAL | Status: AC

## 2024-03-22 MED ORDER — ONDANSETRON HCL 4 MG/2ML IJ SOLN
INTRAMUSCULAR | Status: DC | PRN
  Administered 2024-03-22: 4 mg via INTRAVENOUS

## 2024-03-22 MED ORDER — SUGAMMADEX SODIUM 200 MG/2ML IV SOLN
INTRAVENOUS | Status: DC | PRN
  Administered 2024-03-22: 200 mg via INTRAVENOUS

## 2024-03-22 MED ORDER — CEFAZOLIN SODIUM-DEXTROSE 2-4 GM/100ML-% IV SOLN
2.0000 g | INTRAVENOUS | Status: AC
  Administered 2024-03-22: 2 g via INTRAVENOUS

## 2024-03-22 MED ORDER — CHLORHEXIDINE GLUCONATE 0.12 % MT SOLN
OROMUCOSAL | Status: AC
  Administered 2024-03-22: 15 mL via OROMUCOSAL
  Filled 2024-03-22: qty 15

## 2024-03-22 MED ORDER — CHLORHEXIDINE GLUCONATE CLOTH 2 % EX PADS: 6.0000 | MEDICATED_PAD | Freq: Once | CUTANEOUS | Status: DC

## 2024-03-22 MED ORDER — FENTANYL CITRATE (PF) 100 MCG/2ML IJ SOLN
INTRAMUSCULAR | Status: AC
  Filled 2024-03-22: qty 2

## 2024-03-22 MED ORDER — BUPIVACAINE HCL 0.25 % IJ SOLN
INTRAMUSCULAR | Status: DC | PRN
  Administered 2024-03-22: 8 mL

## 2024-03-22 MED ORDER — PHENYLEPHRINE 80 MCG/ML (10ML) SYRINGE FOR IV PUSH (FOR BLOOD PRESSURE SUPPORT)
PREFILLED_SYRINGE | INTRAVENOUS | Status: DC | PRN
  Administered 2024-03-22 (×2): 160 ug via INTRAVENOUS

## 2024-03-22 MED ORDER — OXYCODONE HCL 5 MG/5ML PO SOLN: 5.0000 mg | Freq: Once | ORAL | Status: DC | PRN

## 2024-03-22 MED ORDER — CHLORHEXIDINE GLUCONATE 0.12 % MT SOLN: 15.0000 mL | Freq: Once | OROMUCOSAL | Status: AC

## 2024-03-22 MED ORDER — ENSURE PRE-SURGERY PO LIQD: 296.0000 mL | Freq: Once | ORAL | Status: DC

## 2024-03-22 MED ORDER — ROCURONIUM BROMIDE 10 MG/ML (PF) SYRINGE
PREFILLED_SYRINGE | INTRAVENOUS | Status: DC | PRN
  Administered 2024-03-22: 50 mg via INTRAVENOUS

## 2024-03-22 MED ORDER — ORAL CARE MOUTH RINSE: 15.0000 mL | Freq: Once | OROMUCOSAL | Status: AC

## 2024-03-22 MED ORDER — FENTANYL CITRATE (PF) 250 MCG/5ML IJ SOLN: INTRAMUSCULAR | Status: DC | PRN

## 2024-03-22 MED ORDER — PHENYLEPHRINE HCL-NACL 20-0.9 MG/250ML-% IV SOLN
INTRAVENOUS | Status: DC | PRN
  Administered 2024-03-22: 45 ug/min via INTRAVENOUS

## 2024-03-22 MED ORDER — LACTATED RINGERS IV SOLN: INTRAVENOUS | Status: DC

## 2024-03-22 MED ORDER — DEXAMETHASONE SOD PHOSPHATE PF 10 MG/ML IJ SOLN
INTRAMUSCULAR | Status: DC | PRN
  Administered 2024-03-22: 10 mg via INTRAVENOUS

## 2024-03-22 MED ORDER — TRAMADOL HCL 50 MG PO TABS: 50.0000 mg | ORAL_TABLET | Freq: Four times a day (QID) | ORAL | 0 refills | Status: AC | PRN

## 2024-03-22 MED ORDER — EPHEDRINE SULFATE-NACL 50-0.9 MG/10ML-% IV SOSY
PREFILLED_SYRINGE | INTRAVENOUS | Status: DC | PRN
  Administered 2024-03-22: 5 mg via INTRAVENOUS

## 2024-03-22 MED ORDER — ACETAMINOPHEN 500 MG PO TABS
ORAL_TABLET | ORAL | Status: AC
  Administered 2024-03-22: 1000 mg via ORAL
  Filled 2024-03-22: qty 2

## 2024-03-22 NOTE — H&P (Signed)
 Chief Complaint: Wound Check     History of Present Illness: Raymond Ray is a 74 y.o. male who is seen today as an office consultation at the request of Dr. Arch for evaluation of Wound Check .   History of Present Illness Raymond Ray is a 74 year old male who presents for surgical evaluation of a hernia. He was referred by his family doctor for surgical evaluation of his hernia.  He has had a hernia for a few months without pain. The hernia varies in size and is reducible manually. There is no increase in size over time. He experiences no generalized abdominal pain, nausea, or vomiting. He is currently taking Eliquis  for atrial fibrillation.     Review of Systems: A complete review of systems was obtained from the patient.  I have reviewed this information and discussed as appropriate with the patient.  See HPI as well for other ROS.  Review of Systems  Constitutional:  Negative for fever.  HENT:  Negative for congestion.   Eyes:  Negative for blurred vision.  Respiratory:  Negative for cough, shortness of breath and wheezing.   Cardiovascular:  Negative for chest pain and palpitations.  Gastrointestinal:  Negative for heartburn.  Genitourinary:  Negative for dysuria.  Musculoskeletal:  Negative for myalgias.  Skin:  Negative for rash.  Neurological:  Negative for dizziness and headaches.  Psychiatric/Behavioral:  Negative for depression and suicidal ideas.   All other systems reviewed and are negative.     Medical History: Past Medical History: Diagnosis Date  Heart valve disease   History of cancer    There is no problem list on file for this patient.   Past Surgical History: Procedure Laterality Date  JOINT REPLACEMENT    PROSTATE SURGERY    VASCULAR SURGERY      No Known Allergies  Current Outpatient Medications on File Prior to Visit Medication Sig Dispense Refill  ELIQUIS  5 mg tablet Take 5 mg by mouth 2 (two) times daily    isosorbide  mononitrate  (IMDUR ) 30 MG ER tablet TAKE 1/2 OF A TABLET (15 MG TOTAL) BY MOUTH DAILY    multivitamin with minerals tablet Take 1 tablet by mouth    pregabalin (LYRICA) 50 MG capsule TAKE 6 CAPSULES (300 MG TOTAL) BY MOUTH DAILY.    rosuvastatin  (CRESTOR ) 5 MG tablet 1 TABLET ORALLY ONCE A DAY 30 DAY(S)    zolpidem  (AMBIEN ) 10 mg tablet TAKE 1 TABLET BY MOUTH EVERY DAY AT BEDTIME AS NEEDED FOR 30 DAYS    No current facility-administered medications on file prior to visit.   Family History Problem Relation Age of Onset  Heart valve disease Mother   Diabetes Mother   Breast cancer Sister     Social History  Tobacco Use Smoking Status Never Smokeless Tobacco Never    Social History  Socioeconomic History  Marital status: Married Tobacco Use  Smoking status: Never  Smokeless tobacco: Never Vaping Use  Vaping status: Never Used Substance and Sexual Activity  Alcohol  use: Never  Drug use: Never  Social Drivers of Health  Housing Stability: Unknown (12/28/2023)  Housing Stability Vital Sign   Homeless in the Last Year: No   Objective:   There were no vitals filed for this visit.  There is no height or weight on file to calculate BMI. Physical Exam Constitutional:      Appearance: Normal appearance.  HENT:     Head: Normocephalic and atraumatic.     Nose: Nose normal. No congestion.  Mouth/Throat:     Mouth: Mucous membranes are moist.     Pharynx: Oropharynx is clear.  Eyes:     Pupils: Pupils are equal, round, and reactive to light.  Cardiovascular:     Rate and Rhythm: Normal rate and regular rhythm.     Pulses: Normal pulses.     Heart sounds: Normal heart sounds. No murmur heard.    No friction rub. No gallop.  Pulmonary:     Effort: Pulmonary effort is normal. No respiratory distress.     Breath sounds: Normal breath sounds. No stridor. No wheezing, rhonchi or rales.  Abdominal:     General: Abdomen is flat.     Hernia: A hernia (L > R) is present. Hernia is  present in the left inguinal area and right inguinal area.  Musculoskeletal:        General: Normal range of motion.     Cervical back: Normal range of motion.  Skin:    General: Skin is warm and dry.  Neurological:     General: No focal deficit present.     Mental Status: He is alert and oriented to person, place, and time.  Psychiatric:        Mood and Affect: Mood normal.        Thought Content: Thought content normal.       Assessment and Plan: There are no diagnoses linked to this encounter.  Raymond Ray is a 74 y.o. male   1.  We will proceed to the OR for a LAP BILATERAL(L>R) inguinal hernia repair with mesh. 2. All risks and benefits were discussed with the patient, to generally include infection, bleeding, damage to surrounding structures, acute and chronic nerve pain, and recurrence. Alternatives were offered and described.  All questions were answered and the patient voiced understanding of the procedure and wishes to proceed at this point.       No follow-ups on file.  Lynda Leos, MD, [facility], [place] General & Minimally Invasive Surgery

## 2024-03-22 NOTE — Anesthesia Postprocedure Evaluation (Signed)
"   Anesthesia Post Note  Patient: Raymond Ray  Procedure(s) Performed: REPAIR, HERNIA, INGUINAL, LAPAROSCOPIC (Bilateral)     Patient location during evaluation: PACU Anesthesia Type: General Level of consciousness: awake and alert, oriented and patient cooperative Pain management: pain level controlled Vital Signs Assessment: post-procedure vital signs reviewed and stable Respiratory status: spontaneous breathing, nonlabored ventilation and respiratory function stable Cardiovascular status: blood pressure returned to baseline and stable Postop Assessment: no apparent nausea or vomiting Anesthetic complications: no   No notable events documented.  Last Vitals:  Vitals:   03/22/24 1400 03/22/24 1415  BP: 94/61 (!) 96/56  Pulse: 82 77  Resp: 13 12  Temp:  36.6 C  SpO2: 97% 97%    Last Pain:  Vitals:   03/22/24 1400  TempSrc:   PainSc: 1                  Almarie HERO Laberta Wilbon      "

## 2024-03-22 NOTE — Anesthesia Procedure Notes (Signed)
 Procedure Name: Intubation Date/Time: 03/22/2024 11:28 AM  Performed by: Lamar Lucie DASEN, CRNAPre-anesthesia Checklist: Patient identified, Emergency Drugs available, Suction available and Patient being monitored Patient Re-evaluated:Patient Re-evaluated prior to induction Oxygen Delivery Method: Circle system utilized Preoxygenation: Pre-oxygenation with 100% oxygen Induction Type: IV induction Ventilation: Mask ventilation without difficulty Laryngoscope Size: Mac and 4 Grade View: Grade I Tube type: Oral Tube size: 7.5 mm Number of attempts: 1 Airway Equipment and Method: Stylet and Oral airway Placement Confirmation: ETT inserted through vocal cords under direct vision, positive ETCO2 and breath sounds checked- equal and bilateral Secured at: 21 cm Tube secured with: Tape Dental Injury: Teeth and Oropharynx as per pre-operative assessment

## 2024-03-22 NOTE — Discharge Instructions (Addendum)
 [facility] ______[facility] LAPAROSCOPIC SURGERY: POST OP INSTRUCTIONS Always review your discharge instruction sheet given to you by the facility where your surgery was performed. IF YOU HAVE DISABILITY OR FAMILY LEAVE FORMS, YOU MUST BRING THEM TO THE OFFICE FOR PROCESSING.   DO NOT GIVE THEM TO YOUR DOCTOR.  A prescription for pain medication may be given to you upon discharge.  Take your pain medication as prescribed, if needed.  If narcotic pain medicine is not needed, then you may take acetaminophen  (Tylenol ) or ibuprofen  (Advil ) as needed. Take your usually prescribed medications unless otherwise directed. If you need a refill on your pain medication, please contact your pharmacy.  They will contact our office to request authorization. Prescriptions will not be filled after 5pm or on week-ends. You should follow a light diet the first few days after arrival home, such as soup and crackers, etc.  Be sure to include lots of fluids daily. Most patients will experience some swelling and bruising in the area of the incisions.  Ice packs will help.  Swelling and bruising can take several days to resolve.  It is common to experience some constipation if taking pain medication after surgery.  Increasing fluid intake and taking a stool softener (such as Colace) will usually help or prevent this problem from occurring.  A mild laxative (Milk of Magnesia or Miralax) should be taken according to package instructions if there are no bowel movements after 48 hours. Unless discharge instructions indicate otherwise, you may remove your bandages 24-48 hours after surgery, and you may shower at that time.  You may have steri-strips (small skin tapes) in place directly over the incision.  These strips should be left on the skin for 7-10 days.  If your surgeon used skin glue on the incision, you may shower in 24 hours.  The glue will flake off over the next 2-3 weeks.  Any sutures or staples will be  removed at the office during your follow-up visit. ACTIVITIES:  You may resume regular (light) daily activities beginning the next day--such as daily self-care, walking, climbing stairs--gradually increasing activities as tolerated.  You may have sexual intercourse when it is comfortable.  Refrain from any heavy lifting or straining until approved by your doctor. You may drive when you are no longer taking prescription pain medication, you can comfortably wear a seatbelt, and you can safely maneuver your car and apply brakes. RETURN TO WORK:  __________________________________________________________ Raymond Ray should see your doctor in the office for a follow-up appointment approximately 2-3 weeks after your surgery.  Make sure that you call for this appointment within a day or two after you arrive home to insure a convenient appointment time. OTHER INSTRUCTIONS: __________________________________________________________________________________________________________________________ __________________________________________________________________________________________________________________________ WHEN TO CALL YOUR DOCTOR: Fever over 101.0 Inability to urinate Continued bleeding from incision. Increased pain, redness, or drainage from the incision. Increasing abdominal pain  The clinic staff is available to answer your questions during regular business hours.  Please don't hesitate to call and ask to speak to one of the nurses for clinical concerns.  If you have a medical emergency, go to the nearest emergency room or call 911.  A surgeon from [facility] is always on call at the hospital. [address], [address], [place], [place]  [address] ? [address] [address], [place], [place]   [address] [phone] ? [phone] ? FAX [phone] Web site: [email or web address]

## 2024-03-22 NOTE — Transfer of Care (Signed)
 Immediate Anesthesia Transfer of Care Note  Patient: Raymond Ray  Procedure(s) Performed: REPAIR, HERNIA, INGUINAL, LAPAROSCOPIC (Bilateral)  Patient Location: PACU  Anesthesia Type:General  Level of Consciousness: drowsy and patient cooperative  Airway & Oxygen Therapy: Patient Spontanous Breathing and Patient connected to nasal cannula oxygen  Post-op Assessment: Report given to RN, Post -op Vital signs reviewed and stable, and Patient moving all extremities X 4  Post vital signs: Reviewed and stable  Last Vitals:  Vitals Value Taken Time  BP 111/77 03/22/24 12:56  Temp    Pulse 96 03/22/24 12:58  Resp 17 03/22/24 12:58  SpO2 99 % 03/22/24 12:58  Vitals shown include unfiled device data.  Last Pain:  Vitals:   03/22/24 1049  TempSrc:   PainSc: 0-No pain         Complications: No notable events documented.

## 2024-03-22 NOTE — Op Note (Signed)
 03/22/2024 12:00 PM  12:35 PM  PATIENT:  Raymond Ray  74 y.o. male  PRE-OPERATIVE DIAGNOSIS:  BILATERAL INGUINAL HERNIAS  POST-OPERATIVE DIAGNOSIS:  BILATERAL INDIRECT INGUINAL HERNIAS, BILATERAL CORD LIPOMAS  PROCEDURE:  Procedures with comments: REPAIR, HERNIA, INGUINAL, LAPAROSCOPIC (Bilateral) - LAPAROSCOPIC BILATERAL INGUINAL HERNIA REPAIR WITH MESH  SURGEON:  Surgeons and Role:    * Rubin Calamity, MD - Primary  ASSISTANTS: NNAEMEKA EZE, md pgy-3  ANESTHESIA:   local and general  EBL:  30 mL   BLOOD ADMINISTERED:none  DRAINS: none   LOCAL MEDICATIONS USED:  MARCAINE      SPECIMEN:  No Specimen  DISPOSITION OF SPECIMEN:  N/A  COUNTS:  YES  TOURNIQUET:  * No tourniquets in log *  DICTATION: .Dragon Dictation   Counts: reported as correct x 2  Findings:  The patient had a small right & left indirect hernias, and concurrent fat lipomas.  There was significant amount of scar tissue of the peritoneum down to the iliac vessels bilaterally.  This portion was left in place.  Indications for procedure:  The patient is a 74 year old male with a bilateral hernias for several months. Patient complained of symptomatology to his bilateral inguinal areas. The patient was taken back for elective inguinal hernia repair.  Details of the procedure:   The patient was taken back to the operating room. The patient was placed in supine position with bilateral SCDs in place.  The patient underwent GETA.  The patient was prepped and draped in the usual sterile fashion.  After appropriate anitbiotics were confirmed, a time-out was confirmed and all facts were verified.  0.25% Marcaine  was used to infiltrate the umbilical area. A 11-blade was used to cut down the skin and blunt dissection was used to get the anterior fashion.  The anterior fascia was incised approximately 1 cm and the muscles were retracted laterally. Blunt dissection was then used to create a space in the  preperitoneal area. At this time a 10 mm camera was then introduced into the space and advanced the pubic tubercle and a 12 mm trocar was placed over this and insufflation was started.   At this time and space was created from medial to laterally the preperitoneal space on the left.  Cooper's ligament was initially cleaned off.  There was some adherent peritoneum to the left iliacs.  This was left in place.  I dissected the left cord.  This was done circumferentially.  A cord lipoma was pulled up through the internal inguinal ring.  There was no peritoneum that was down to the indirect space.  The vas deferens was identified and protected in all parts of the case.  The cord lipoma was dissected back.  A space was created laterally for the mesh.  I turned my attention to the right side.  The peritoneum was also adherent to the iliac vessel space.  The adherent peritoneum was left in place.  At this time I decided to create a space laterally.  I was able to visualize the cord.  This was dissected away.  The cord lipoma was also pulled out from the internal ring.  This was dissected back.  A Bard 3D Max mesh, size: Large, LEFT and RIGHT was  introduced into the preperitoneal space.  The mesh was brought over to cover the direct and indirect and femoral hernia spaces.  This was anchored into place and secured to Cooper's ligament with 4.41mm staples from a Cov[place] hernia stapler. It was anchored to the  anterior abdominal wall with 4.8 mm staples. The hernia sac was seen lying posterior to the mesh. There was no staples placed laterally.   The insufflation was evacuated and the peritoneum was seen posterior to the mesh bilaterally. The trochars were removed. The anterior fascia was reapproximated using #1 Vicryl on a UR- 6.  Intra-abdominal air was evacuated and the Veress needle removed. The skin was reapproximated using 4-0 Monocryl subcuticular fashion and was dressed with Dermabond. The  patient was awakened  from general anesthesia and taken to recovery in stable condition.  PLAN OF CARE: Discharge to home after PACU  PATIENT DISPOSITION:  PACU - hemodynamically stable.   Delay start of Pharmacological VTE agent (>24hrs) due to surgical blood loss or risk of bleeding: not applicable

## 2024-03-23 ENCOUNTER — Encounter (HOSPITAL_COMMUNITY): Payer: Self-pay | Admitting: General Surgery

## 2024-08-08 ENCOUNTER — Inpatient Hospital Stay

## 2025-01-25 ENCOUNTER — Other Ambulatory Visit (HOSPITAL_BASED_OUTPATIENT_CLINIC_OR_DEPARTMENT_OTHER)

## 2025-02-08 ENCOUNTER — Inpatient Hospital Stay: Admitting: Hematology and Oncology

## 2025-02-08 ENCOUNTER — Inpatient Hospital Stay
